# Patient Record
Sex: Female | Born: 1945 | Race: White | Hispanic: No | Smoking: Never smoker
Health system: Southern US, Community
[De-identification: ages and names within clinical notes are randomized; demographics above are authoritative.]

## PROBLEM LIST (undated history)

## (undated) DIAGNOSIS — M1990 Unspecified osteoarthritis, unspecified site: Secondary | ICD-10-CM

## (undated) DIAGNOSIS — J988 Other specified respiratory disorders: Secondary | ICD-10-CM

## (undated) DIAGNOSIS — A310 Pulmonary mycobacterial infection: Secondary | ICD-10-CM

## (undated) DIAGNOSIS — I509 Heart failure, unspecified: Secondary | ICD-10-CM

## (undated) DIAGNOSIS — B965 Pseudomonas (aeruginosa) (mallei) (pseudomallei) as the cause of diseases classified elsewhere: Secondary | ICD-10-CM

## (undated) DIAGNOSIS — A319 Mycobacterial infection, unspecified: Secondary | ICD-10-CM

## (undated) DIAGNOSIS — A318 Other mycobacterial infections: Secondary | ICD-10-CM

## (undated) DIAGNOSIS — I10 Essential (primary) hypertension: Secondary | ICD-10-CM

## (undated) DIAGNOSIS — E079 Disorder of thyroid, unspecified: Secondary | ICD-10-CM

## (undated) HISTORY — PX: ABDOMINAL HYSTERECTOMY: SHX81

## (undated) HISTORY — PX: CARDIAC SURGERY: SHX584

## (undated) HISTORY — PX: TONSILLECTOMY: SUR1361

---
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
## 1963-04-23 HISTORY — PX: CYST REMOVAL TRUNK: SHX6283

## 1970-04-22 HISTORY — PX: VARICOSE VEIN SURGERY: SHX832

## 1973-04-22 HISTORY — PX: DILATION AND CURETTAGE OF UTERUS: SHX78

## 1981-04-22 HISTORY — PX: HAND SURGERY: SHX662

## 1999-04-23 HISTORY — PX: BRONCHOSCOPY: SUR163

## 2006-04-22 HISTORY — PX: COLONOSCOPY: SHX174

## 2011-02-27 DIAGNOSIS — F411 Generalized anxiety disorder: Secondary | ICD-10-CM | POA: Diagnosis present

## 2014-10-12 DIAGNOSIS — F324 Major depressive disorder, single episode, in partial remission: Secondary | ICD-10-CM | POA: Diagnosis present

## 2016-03-24 ENCOUNTER — Encounter (HOSPITAL_COMMUNITY): Payer: Self-pay | Admitting: Emergency Medicine

## 2016-03-24 ENCOUNTER — Emergency Department (HOSPITAL_COMMUNITY)
Admission: EM | Admit: 2016-03-24 | Discharge: 2016-03-24 | Disposition: A | Discharge status: Home / Self Care | Payer: Medicare Other | Attending: Emergency Medicine | Admitting: Emergency Medicine

## 2016-03-24 DIAGNOSIS — Z7982 Long term (current) use of aspirin: Secondary | ICD-10-CM | POA: Diagnosis not present

## 2016-03-24 DIAGNOSIS — R55 Syncope and collapse: Secondary | ICD-10-CM | POA: Insufficient documentation

## 2016-03-24 DIAGNOSIS — I10 Essential (primary) hypertension: Secondary | ICD-10-CM | POA: Diagnosis not present

## 2016-03-24 DIAGNOSIS — Z79899 Other long term (current) drug therapy: Secondary | ICD-10-CM | POA: Diagnosis not present

## 2016-03-24 DIAGNOSIS — R42 Dizziness and giddiness: Secondary | ICD-10-CM | POA: Diagnosis present

## 2016-03-24 HISTORY — DX: Pulmonary mycobacterial infection: A31.0

## 2016-03-24 HISTORY — DX: Other mycobacterial infections: A31.8

## 2016-03-24 HISTORY — DX: Unspecified osteoarthritis, unspecified site: M19.90

## 2016-03-24 HISTORY — DX: Mycobacterial infection, unspecified: A31.9

## 2016-03-24 HISTORY — DX: Disorder of thyroid, unspecified: E07.9

## 2016-03-24 HISTORY — DX: Essential (primary) hypertension: I10

## 2016-03-24 LAB — BASIC METABOLIC PANEL
Anion gap: 8 (ref 5–15)
BUN: 26 mg/dL — ABNORMAL HIGH (ref 6–20)
CO2: 28 mmol/L (ref 22–32)
Calcium: 9 mg/dL (ref 8.9–10.3)
Chloride: 101 mmol/L (ref 101–111)
Creatinine, Ser: 1.25 mg/dL — ABNORMAL HIGH (ref 0.44–1.00)
GFR calc Af Amer: 49 mL/min — ABNORMAL LOW (ref >60)
GFR calc non Af Amer: 42 mL/min — ABNORMAL LOW (ref >60)
Glucose, Bld: 81 mg/dL (ref 65–99)
Potassium: 4.9 mmol/L (ref 3.5–5.1)
Sodium: 137 mmol/L (ref 135–145)

## 2016-03-24 LAB — CBC
HCT: 42.9 % (ref 36.0–46.0)
Hemoglobin: 14.3 g/dL (ref 12.0–15.0)
MCH: 31.2 pg (ref 26.0–34.0)
MCHC: 33.3 g/dL (ref 30.0–36.0)
MCV: 93.7 fL (ref 78.0–100.0)
Platelets: 194 10*3/uL (ref 150–400)
RBC: 4.58 MIL/uL (ref 3.87–5.11)
RDW: 13.4 % (ref 11.5–15.5)
WBC: 6.9 10*3/uL (ref 4.0–10.5)

## 2016-03-24 LAB — CBG MONITORING, ED: Glucose-Capillary: 78 mg/dL (ref 65–99)

## 2016-03-24 NOTE — ED Triage Notes (Signed)
Attempted to draw blood from IV, unable to obtain.Redressed site

## 2016-03-24 NOTE — ED Triage Notes (Signed)
Pt with near syncope at approximately 1000, pt states she was in church and felt dizzy and weak, lost vision, but no LOC. Pt states she then felt nauseated.

## 2016-03-24 NOTE — ED Notes (Signed)
Bed: WF09 Expected date:  Expected time:  Means of arrival:  Comments: 70 yo syncope- NSB@58 

## 2016-03-24 NOTE — ED Provider Notes (Signed)
[facility]-EMERGENCY DEPT Provider Note   CSN: [id number] Arrival date & time: 03/24/16  1102     History   Chief Complaint Chief Complaint  Patient presents with  . Dizziness  . Weakness    HPI Stacy Sanchez is a 70 y.o. female.  70 yo F with a chief complaint of a syncopal event. Patient was sitting in church and had a feeling that the world was closing in on her. Does not feel that she went [place] out. Denied chest pain shortness of breath headache neck pain. Denies any recent injury denies blood in her stool or dark stool. Denies fevers or chills and denies decreased oral intake. Patient is currently back to baseline and feels fine. Has a history of chronic pulmonary disease and feels that it's at its baseline. Having a mild increased cough. Denies increased sputum or change in sputum.   The history is provided by the patient.  Dizziness  Associated symptoms: syncope and weakness   Associated symptoms: no chest pain, no headaches, no nausea, no palpitations, no shortness of breath and no vomiting   Weakness  Primary symptoms include no dizziness. Pertinent negatives include no shortness of breath, no chest pain, no vomiting and no headaches.  Loss of Consciousness   This is a new problem. The current episode started 1 to 2 hours ago. The problem occurs rarely. The problem has been resolved. There was no loss of consciousness. The problem is associated with normal activity. Associated symptoms include weakness. Pertinent negatives include chest pain, congestion, dizziness, fever, headaches, nausea, palpitations and vomiting. She has tried nothing for the symptoms. The treatment provided no relief.    Past Medical History:  Diagnosis Date  . Arthritis   . Hypertension   . Mycobacterium abscessus infection   . Mycobacterium avium complex (HCC)   . Thyroid disease     There are no active problems to display for this patient.   Past Surgical History:  Procedure Laterality Date    . ABDOMINAL HYSTERECTOMY    . CARDIAC SURGERY    . TONSILLECTOMY      OB History    No data available       Home Medications    Prior to Admission medications   Medication Sig Start Date End Date Taking? Authorizing Provider  amLODipine (NORVASC) 2.5 MG tablet Take 2.5 mg by mouth daily.   Yes Historical Provider, MD  aspirin EC 81 MG tablet Take 81 mg by mouth daily.   Yes Historical Provider, MD  buPROPion (WELLBUTRIN) 75 MG tablet Take 75 mg by mouth daily.   Yes Historical Provider, MD  escitalopram (LEXAPRO) 5 MG tablet Take 5 mg by mouth daily.   Yes Historical Provider, MD  gabapentin (NEURONTIN) 300 MG capsule Take 300 mg by mouth 2 (two) times daily.   Yes Historical Provider, MD  HYDROcodone-acetaminophen (NORCO/VICODIN) 5-325 MG tablet Take 0.5 tablets by mouth every 6 (six) hours as needed for moderate pain.   Yes Historical Provider, MD  levothyroxine (SYNTHROID, LEVOTHROID) 50 MCG tablet Take 50 mcg by mouth daily before breakfast. Take 1 tablet by mouth daily except for Saturday's take 1.5 tablets   Yes Historical Provider, MD  Multiple Vitamin (MULTIVITAMIN WITH MINERALS) TABS tablet Take 1 tablet by mouth daily.   Yes Historical Provider, MD  nadolol (CORGARD) 20 MG tablet Take 20 mg by mouth daily.   Yes Historical Provider, MD  PRESCRIPTION MEDICATION 80 mg/mL. Gentamicin 80 mg/mL inhalation solution mixed with NaCl. Use in nebulizer  once daily.   Yes Historical Provider, MD    Family History History reviewed. No pertinent family history.  Social History Social History  Substance Use Topics  . Smoking status: Never Smoker  . Smokeless tobacco: Never Used  . Alcohol use No     Allergies   Amikacin; Ethambutol; Levaquin [levofloxacin]; Parafon forte dsc [chlorzoxazone]; Rifabutin; Tequin [gatifloxacin]; Tramadol; and Zyvox [linezolid]   Review of Systems Review of Systems  Constitutional: Negative for chills and fever.  HENT: Negative for congestion  and rhinorrhea.   Eyes: Negative for redness and visual disturbance.  Respiratory: Positive for cough. Negative for shortness of breath and wheezing.   Cardiovascular: Positive for syncope. Negative for chest pain and palpitations.  Gastrointestinal: Negative for nausea and vomiting.  Genitourinary: Negative for dysuria and urgency.  Musculoskeletal: Negative for arthralgias and myalgias.  Skin: Negative for pallor and wound.  Neurological: Positive for syncope and weakness. Negative for dizziness and headaches.     Physical Exam Updated Vital Signs BP 151/96   Pulse 70   Temp 97.8 F (36.6 C) (Oral)   Resp 21   Ht 5\' 8"  (1.727 m)   Wt 133 lb (60.3 kg)   SpO2 95%   BMI 20.22 kg/m   Physical Exam  Constitutional: She is oriented to person, place, and time. She appears well-developed and well-nourished. No distress.  HENT:  Head: Normocephalic and atraumatic.  Eyes: EOM are normal. Pupils are equal, round, and reactive to light.  Neck: Normal range of motion. Neck supple.  Cardiovascular: Normal rate and regular rhythm.  Exam reveals no gallop and no friction rub.   No murmur heard. Pulmonary/Chest: Effort normal. She has no wheezes. She has rhonchi in the right lower field and the left lower field. She has no rales.  Abdominal: Soft. She exhibits no distension and no mass. There is no tenderness. There is no guarding.  Musculoskeletal: She exhibits no edema or tenderness.  Neurological: She is alert and oriented to person, place, and time. She has normal strength. No cranial nerve deficit or sensory deficit. GCS eye subscore is 4. GCS verbal subscore is 5. GCS motor subscore is 6.  Reflex Scores:      Tricep reflexes are 2+ on the right side and 2+ on the left side.      Bicep reflexes are 2+ on the right side and 2+ on the left side.      Brachioradialis reflexes are 2+ on the right side and 2+ on the left side.      Patellar reflexes are 2+ on the right side and 2+ on the  left side.      Achilles reflexes are 2+ on the right side and 2+ on the left side. Skin: Skin is warm and dry. She is not diaphoretic.  Psychiatric: She has a normal mood and affect. Her behavior is normal.  Nursing note and vitals reviewed.    ED Treatments / Results  Labs (all labs ordered are listed, but only abnormal results are displayed) Labs Reviewed  BASIC METABOLIC PANEL - Abnormal; Notable for the following:       Result Value   BUN 26 (*)    Creatinine, Ser 1.25 (*)    GFR calc non Af Amer 42 (*)    GFR calc Af Amer 49 (*)    All other components within normal limits  CBC  URINALYSIS, ROUTINE W REFLEX MICROSCOPIC (NOT AT [facility])  CBG MONITORING, ED    EKG  EKG  Interpretation  Date/Time:  Sunday March 24 2016 11:02:54 EST Ventricular Rate:  49 PR Interval:    QRS Duration: 88 QT Interval:  468 QTC Calculation: 423 R Axis:   -63 Text Interpretation:  Sinus bradycardia Probable left atrial enlargement LAD, consider left anterior fascicular block Nonspecific T abnormalities, anterior leads no prolonged qt, brugada or wpw No old tracing to compare Confirmed by Chasten Blaze MD, DANIEL ([id number]) on 03/24/2016 12:08:43 PM       Radiology No results found.  Procedures Procedures (including critical care time)  Medications Ordered in ED Medications - No data to display   Initial Impression / Assessment and Plan / ED Course  I have reviewed the triage vital signs and the nursing notes.  Pertinent labs & imaging results that were available during my care of the patient were reviewed by me and considered in my medical decision making (see chart for details).  Clinical Course     70  yo F With a chief complaint of a near-syncopal episode while at church. This occurred while she was sitting and resting. Had a feeling of the world was closing in on her. Now feels great. EKG with no significant findings. Will obtain basic labs secondary to age. Hx of MAC, sees pulmonology at  [facility].  Bad lung sounds, likely poor at baseline, offered breathing treatments and steroids but declined.   Labs unremarkable.  D/c home.   1:48 PM:  I have discussed the diagnosis/risks/treatment options with the patient and family and believe the pt to be eligible for discharge home to follow-up with PCP. We also discussed returning to the ED immediately if new or worsening sx occur. We discussed the sx which are most concerning (e.g., sudden worsening pain, fever, inability to tolerate by mouth) that necessitate immediate return. Medications administered to the patient during their visit and any new prescriptions provided to the patient are listed below.  Medications given during this visit Medications - No data to display   The patient appears reasonably screen and/or stabilized for discharge and I doubt any other medical condition or other [facility] requiring further screening, evaluation, or treatment in the ED at this time prior to discharge.    Final Clinical Impressions(s) / ED Diagnoses   Final diagnoses:  Near syncope    New Prescriptions New Prescriptions   No medications on file     Melene Plan, DO 03/24/16 1348

## 2016-03-24 NOTE — Discharge Instructions (Signed)
Follow up with the family doc, drink plenty of fluids, return for sob, chest pain .

## 2016-03-24 NOTE — ED Notes (Signed)
PT aware of urine sample 

## 2016-05-03 DIAGNOSIS — R413 Other amnesia: Secondary | ICD-10-CM | POA: Diagnosis present

## 2018-11-27 ENCOUNTER — Inpatient Hospital Stay (HOSPITAL_COMMUNITY)
Admission: EM | Admit: 2018-11-27 | Discharge: 2018-12-01 | DRG: 190 | Disposition: A | Discharge status: Home Health | Payer: Medicare Other | Attending: Internal Medicine | Admitting: Internal Medicine

## 2018-11-27 ENCOUNTER — Emergency Department (HOSPITAL_COMMUNITY): Payer: Medicare Other

## 2018-11-27 ENCOUNTER — Other Ambulatory Visit: Payer: Self-pay

## 2018-11-27 DIAGNOSIS — A498 Other bacterial infections of unspecified site: Secondary | ICD-10-CM | POA: Diagnosis present

## 2018-11-27 DIAGNOSIS — Z7982 Long term (current) use of aspirin: Secondary | ICD-10-CM

## 2018-11-27 DIAGNOSIS — G620 Drug-induced polyneuropathy: Secondary | ICD-10-CM

## 2018-11-27 DIAGNOSIS — B379 Candidiasis, unspecified: Secondary | ICD-10-CM | POA: Diagnosis present

## 2018-11-27 DIAGNOSIS — J9601 Acute respiratory failure with hypoxia: Secondary | ICD-10-CM | POA: Diagnosis present

## 2018-11-27 DIAGNOSIS — Z79899 Other long term (current) drug therapy: Secondary | ICD-10-CM

## 2018-11-27 DIAGNOSIS — M1990 Unspecified osteoarthritis, unspecified site: Secondary | ICD-10-CM | POA: Diagnosis present

## 2018-11-27 DIAGNOSIS — J189 Pneumonia, unspecified organism: Secondary | ICD-10-CM | POA: Diagnosis present

## 2018-11-27 DIAGNOSIS — F411 Generalized anxiety disorder: Secondary | ICD-10-CM | POA: Diagnosis present

## 2018-11-27 DIAGNOSIS — Z888 Allergy status to other drugs, medicaments and biological substances status: Secondary | ICD-10-CM

## 2018-11-27 DIAGNOSIS — J470 Bronchiectasis with acute lower respiratory infection: Principal | ICD-10-CM | POA: Diagnosis present

## 2018-11-27 DIAGNOSIS — F329 Major depressive disorder, single episode, unspecified: Secondary | ICD-10-CM | POA: Diagnosis present

## 2018-11-27 DIAGNOSIS — Z7952 Long term (current) use of systemic steroids: Secondary | ICD-10-CM | POA: Diagnosis not present

## 2018-11-27 DIAGNOSIS — N183 Chronic kidney disease, stage 3 unspecified: Secondary | ICD-10-CM | POA: Diagnosis present

## 2018-11-27 DIAGNOSIS — I10 Essential (primary) hypertension: Secondary | ICD-10-CM

## 2018-11-27 DIAGNOSIS — G629 Polyneuropathy, unspecified: Secondary | ICD-10-CM | POA: Diagnosis present

## 2018-11-27 DIAGNOSIS — Z79890 Hormone replacement therapy (postmenopausal): Secondary | ICD-10-CM

## 2018-11-27 DIAGNOSIS — Z792 Long term (current) use of antibiotics: Secondary | ICD-10-CM

## 2018-11-27 DIAGNOSIS — Z79891 Long term (current) use of opiate analgesic: Secondary | ICD-10-CM | POA: Diagnosis not present

## 2018-11-27 DIAGNOSIS — A310 Pulmonary mycobacterial infection: Secondary | ICD-10-CM | POA: Diagnosis present

## 2018-11-27 DIAGNOSIS — E039 Hypothyroidism, unspecified: Secondary | ICD-10-CM | POA: Diagnosis present

## 2018-11-27 DIAGNOSIS — J471 Bronchiectasis with (acute) exacerbation: Secondary | ICD-10-CM | POA: Diagnosis present

## 2018-11-27 DIAGNOSIS — B370 Candidal stomatitis: Secondary | ICD-10-CM | POA: Diagnosis present

## 2018-11-27 DIAGNOSIS — I2510 Atherosclerotic heart disease of native coronary artery without angina pectoris: Secondary | ICD-10-CM | POA: Diagnosis present

## 2018-11-27 DIAGNOSIS — B965 Pseudomonas (aeruginosa) (mallei) (pseudomallei) as the cause of diseases classified elsewhere: Secondary | ICD-10-CM | POA: Diagnosis present

## 2018-11-27 DIAGNOSIS — M419 Scoliosis, unspecified: Secondary | ICD-10-CM | POA: Diagnosis present

## 2018-11-27 DIAGNOSIS — Z20828 Contact with and (suspected) exposure to other viral communicable diseases: Secondary | ICD-10-CM | POA: Diagnosis present

## 2018-11-27 DIAGNOSIS — Y95 Nosocomial condition: Secondary | ICD-10-CM | POA: Diagnosis present

## 2018-11-27 DIAGNOSIS — I129 Hypertensive chronic kidney disease with stage 1 through stage 4 chronic kidney disease, or unspecified chronic kidney disease: Secondary | ICD-10-CM | POA: Diagnosis present

## 2018-11-27 DIAGNOSIS — J479 Bronchiectasis, uncomplicated: Secondary | ICD-10-CM

## 2018-11-27 DIAGNOSIS — R079 Chest pain, unspecified: Secondary | ICD-10-CM | POA: Diagnosis present

## 2018-11-27 LAB — URINALYSIS, ROUTINE W REFLEX MICROSCOPIC
Bilirubin Urine: NEGATIVE
Glucose, UA: NEGATIVE mg/dL
Hgb urine dipstick: NEGATIVE
Ketones, ur: NEGATIVE mg/dL
Leukocytes,Ua: NEGATIVE
Nitrite: NEGATIVE
Protein, ur: NEGATIVE mg/dL
Specific Gravity, Urine: 1.009 (ref 1.005–1.030)
pH: 7 (ref 5.0–8.0)

## 2018-11-27 LAB — CBC WITH DIFFERENTIAL/PLATELET
Abs Immature Granulocytes: 0.15 10*3/uL — ABNORMAL HIGH (ref 0.00–0.07)
Basophils Absolute: 0 10*3/uL (ref 0.0–0.1)
Basophils Relative: 0 %
Eosinophils Absolute: 0 10*3/uL (ref 0.0–0.5)
Eosinophils Relative: 0 %
HCT: 40.4 % (ref 36.0–46.0)
Hemoglobin: 12.9 g/dL (ref 12.0–15.0)
Immature Granulocytes: 1 %
Lymphocytes Relative: 4 %
Lymphs Abs: 0.8 10*3/uL (ref 0.7–4.0)
MCH: 30.7 pg (ref 26.0–34.0)
MCHC: 31.9 g/dL (ref 30.0–36.0)
MCV: 96.2 fL (ref 80.0–100.0)
Monocytes Absolute: 1 10*3/uL (ref 0.1–1.0)
Monocytes Relative: 5 %
Neutro Abs: 18.2 10*3/uL — ABNORMAL HIGH (ref 1.7–7.7)
Neutrophils Relative %: 90 %
Platelets: 255 10*3/uL (ref 150–400)
RBC: 4.2 MIL/uL (ref 3.87–5.11)
RDW: 14.6 % (ref 11.5–15.5)
WBC: 20.2 10*3/uL — ABNORMAL HIGH (ref 4.0–10.5)
nRBC: 0 % (ref 0.0–0.2)

## 2018-11-27 LAB — SARS CORONAVIRUS 2 BY RT PCR (HOSPITAL ORDER, PERFORMED IN ~~LOC~~ HOSPITAL LAB): SARS Coronavirus 2: NEGATIVE

## 2018-11-27 LAB — COMPREHENSIVE METABOLIC PANEL
ALT: 27 U/L (ref 0–44)
AST: 19 U/L (ref 15–41)
Albumin: 2.9 g/dL — ABNORMAL LOW (ref 3.5–5.0)
Alkaline Phosphatase: 47 U/L (ref 38–126)
Anion gap: 9 (ref 5–15)
BUN: 24 mg/dL — ABNORMAL HIGH (ref 8–23)
CO2: 31 mmol/L (ref 22–32)
Calcium: 8.9 mg/dL (ref 8.9–10.3)
Chloride: 98 mmol/L (ref 98–111)
Creatinine, Ser: 0.98 mg/dL (ref 0.44–1.00)
GFR calc Af Amer: >60 mL/min (ref >60)
GFR calc non Af Amer: 56 mL/min — ABNORMAL LOW (ref >60)
Glucose, Bld: 101 mg/dL — ABNORMAL HIGH (ref 70–99)
Potassium: 4.1 mmol/L (ref 3.5–5.1)
Sodium: 138 mmol/L (ref 135–145)
Total Bilirubin: 0.8 mg/dL (ref 0.3–1.2)
Total Protein: 6.2 g/dL — ABNORMAL LOW (ref 6.5–8.1)

## 2018-11-27 IMAGING — CT CT ANGIOGRAPHY CHEST
1 of 6 series · 4 of 16 positions shown · IV contrast (omnipaque)
Comparison: None.

CLINICAL DATA: Shortness of breath. History of BEACOCK lung infection
with recent back surgery 2 weeks ago. Right chest pain and shortness
of breath. Concern for pulmonary embolism.

EXAM:
CT ANGIOGRAPHY CHEST WITH CONTRAST
TECHNIQUE: Multidetector CT imaging of the chest was performed using the
standard protocol during bolus administration of intravenous
contrast. Multiplanar CT image reconstructions and MIPs were
obtained to evaluate the vascular anatomy.
CONTRAST:  100mL OMNIPAQUE IOHEXOL 350 MG/ML SOLN

[Series 7: pe thins · axial · 0.74mm/px · z∈[+18,+207]mm · 4 of 452 slices shown]
[im 91/452  lung]
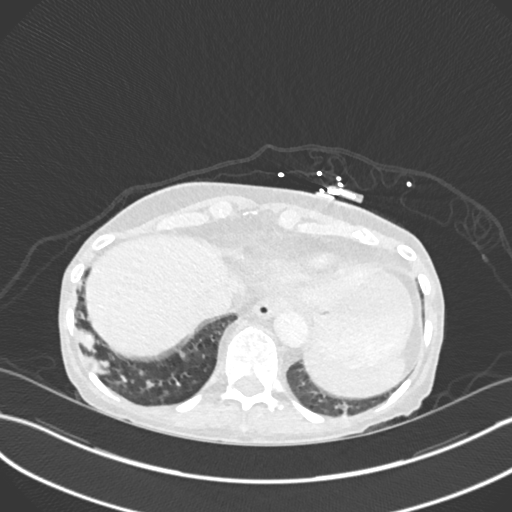
[im 181/452  soft-tissue]
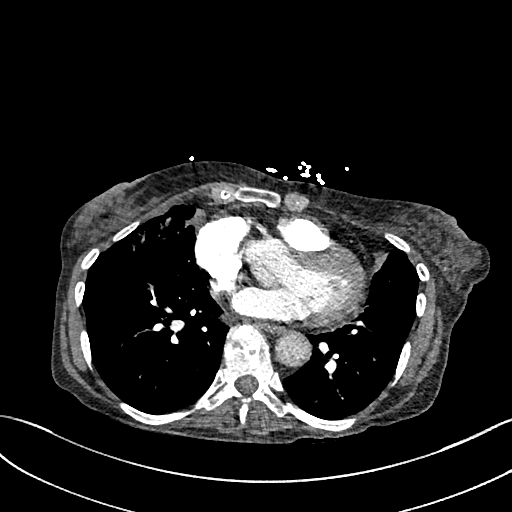
[im 271/452  lung]
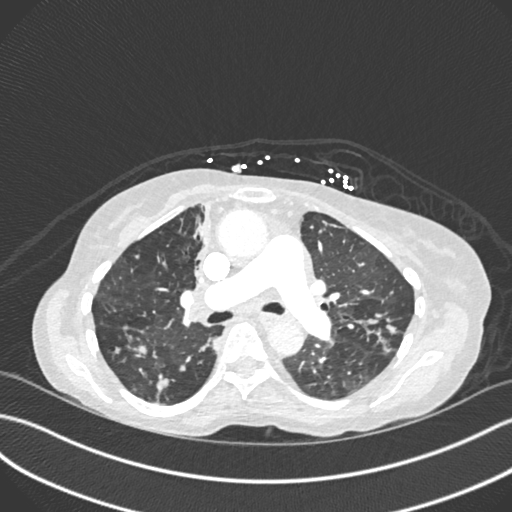
[im 361/452  soft-tissue]
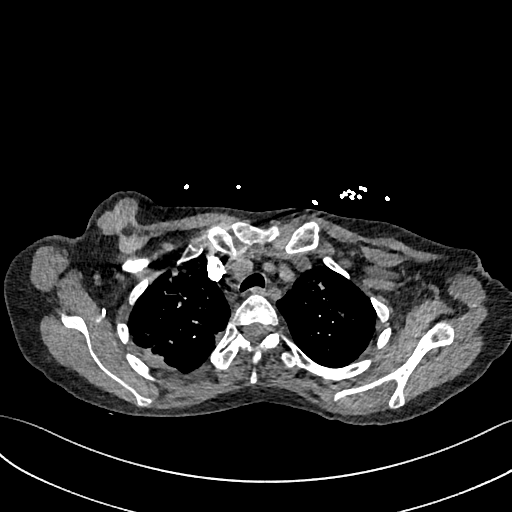

[4 of 16 positions shown; findings below may reference images not displayed]

FINDINGS: Cardiovascular: The pulmonary arteries are well opacified with
contrast to the level of the subsegmental branches. There is no
evidence of acute pulmonary embolism. Minimal atherosclerosis of the
aorta and great vessels. No acute vascular findings are seen. The
heart size is normal. There is no pericardial effusion.

Mediastinum/Nodes: There is a prominent right hilar node measuring
13 mm on image 63/5. There is a subcarinal node measuring 15 mm on
image 77/5. No other enlarged mediastinal, hilar or axillary lymph
nodes are demonstrated. The thyroid gland, trachea and esophagus
demonstrate no significant findings.

Lungs/Pleura: There is no pleural effusion or pneumothorax. There is
extensive chronic lung disease with mosaic attenuation,
bronchiectasis, central airway thickening and mucous plugging. The
bronchiectasis is greatest in the right middle lobe, lingula and
right lower lobe. There are scattered peribronchial inflammatory
changes, greatest in the right upper lobe. No consolidation or
suspicious pulmonary nodule identified. There is mild underlying
emphysema.

Upper abdomen: No significant findings are seen within the
visualized upper abdomen. There is a 10 mm splenic artery aneurysm.

Musculoskeletal/Chest wall: There is no chest wall mass or
suspicious osseous finding.

Review of the MIP images confirms the above findings.
IMPRESSION: 1. No evidence of acute pulmonary embolism or other acute vascular
process.
2. Extensive chronic lung disease compatible with the given history
of atypical mycobacterial infection. Without comparison studies, it
is difficult to exclude acute superimposed inflammation, especially
in the right upper lobe. At least short term chest radiographic
follow up recommended. CT follow-up in 3-6 months should be
considered.
3. Mildly prominent right hilar and subcarinal lymph nodes, likely
reactive.

## 2018-11-27 MED ORDER — VANCOMYCIN HCL IN DEXTROSE 1-5 GM/200ML-% IV SOLN
1000.0000 mg | INTRAVENOUS | Status: DC
  Filled 2018-11-27: qty 200

## 2018-11-27 MED ORDER — SENNOSIDES-DOCUSATE SODIUM 8.6-50 MG PO TABS
1.0000 | ORAL_TABLET | Freq: Every evening | ORAL | Status: DC | PRN
  Administered 2018-11-28 (×2): 1 via ORAL
  Filled 2018-11-27 (×2): qty 1

## 2018-11-27 MED ORDER — BISACODYL 10 MG RE SUPP: 10.0000 mg | Freq: Every day | RECTAL | Status: DC | PRN

## 2018-11-27 MED ORDER — MAGNESIUM CITRATE PO SOLN
1.0000 | Freq: Once | ORAL | Status: AC | PRN
  Administered 2018-11-29: 1 via ORAL
  Filled 2018-11-27: qty 296

## 2018-11-27 MED ORDER — ADULT MULTIVITAMIN W/MINERALS CH
1.0000 | ORAL_TABLET | Freq: Every day | ORAL | Status: DC
  Administered 2018-11-28: 1 via ORAL
  Filled 2018-11-27 (×2): qty 1

## 2018-11-27 MED ORDER — ONDANSETRON HCL 4 MG/2ML IJ SOLN: 4.0000 mg | Freq: Four times a day (QID) | INTRAMUSCULAR | Status: DC | PRN

## 2018-11-27 MED ORDER — ESCITALOPRAM OXALATE 10 MG PO TABS
5.0000 mg | ORAL_TABLET | Freq: Every day | ORAL | Status: DC
  Administered 2018-11-28 – 2018-12-01 (×4): 5 mg via ORAL
  Filled 2018-11-27 (×4): qty 1

## 2018-11-27 MED ORDER — FLUCONAZOLE IN SODIUM CHLORIDE 200-0.9 MG/100ML-% IV SOLN
200.0000 mg | INTRAVENOUS | Status: DC
  Administered 2018-11-27: 200 mg via INTRAVENOUS
  Filled 2018-11-27: qty 100

## 2018-11-27 MED ORDER — PREDNISONE 20 MG PO TABS: 10.0000 mg | ORAL_TABLET | ORAL | Status: DC

## 2018-11-27 MED ORDER — IOHEXOL 350 MG/ML SOLN
100.0000 mL | Freq: Once | INTRAVENOUS | Status: AC | PRN
  Administered 2018-11-27: 15:00:00 100 mL via INTRAVENOUS

## 2018-11-27 MED ORDER — POLYETHYLENE GLYCOL 3350 17 G PO PACK
17.0000 g | PACK | Freq: Every day | ORAL | Status: DC
  Filled 2018-11-27 (×3): qty 1

## 2018-11-27 MED ORDER — GABAPENTIN 300 MG PO CAPS
300.0000 mg | ORAL_CAPSULE | Freq: Two times a day (BID) | ORAL | Status: DC
  Administered 2018-11-28 – 2018-12-01 (×8): 300 mg via ORAL
  Filled 2018-11-27 (×8): qty 1

## 2018-11-27 MED ORDER — PREDNISONE 20 MG PO TABS: 20.0000 mg | ORAL_TABLET | Freq: Every day | ORAL | Status: DC

## 2018-11-27 MED ORDER — SODIUM CHLORIDE 0.9 % IV SOLN
2.0000 g | Freq: Two times a day (BID) | INTRAVENOUS | Status: DC
  Administered 2018-11-28 – 2018-12-01 (×8): 2 g via INTRAVENOUS
  Filled 2018-11-27 (×8): qty 2

## 2018-11-27 MED ORDER — LEVOTHYROXINE SODIUM 50 MCG PO TABS
50.0000 ug | ORAL_TABLET | Freq: Every day | ORAL | Status: DC
  Administered 2018-11-28 – 2018-12-01 (×4): 50 ug via ORAL
  Filled 2018-11-27 (×4): qty 1

## 2018-11-27 MED ORDER — PREDNISONE 5 MG PO TABS
5.0000 mg | ORAL_TABLET | Freq: Every day | ORAL | Status: AC
  Administered 2018-11-30: 5 mg via ORAL
  Filled 2018-11-27: qty 1

## 2018-11-27 MED ORDER — LIDOCAINE 5 % EX PTCH
1.0000 | MEDICATED_PATCH | CUTANEOUS | Status: DC
  Administered 2018-11-27 – 2018-12-01 (×5): 1 via TRANSDERMAL
  Filled 2018-11-27 (×5): qty 1

## 2018-11-27 MED ORDER — ROSUVASTATIN CALCIUM 5 MG PO TABS
10.0000 mg | ORAL_TABLET | Freq: Every day | ORAL | Status: DC
  Administered 2018-11-28 – 2018-12-01 (×4): 10 mg via ORAL
  Filled 2018-11-27 (×4): qty 2

## 2018-11-27 MED ORDER — PREDNISONE 10 MG PO TABS
10.0000 mg | ORAL_TABLET | Freq: Every day | ORAL | Status: AC
  Administered 2018-11-29: 10 mg via ORAL
  Filled 2018-11-27: qty 1

## 2018-11-27 MED ORDER — IPRATROPIUM-ALBUTEROL 0.5-2.5 (3) MG/3ML IN SOLN
3.0000 mL | Freq: Four times a day (QID) | RESPIRATORY_TRACT | Status: DC
  Administered 2018-11-27 – 2018-11-29 (×6): 3 mL via RESPIRATORY_TRACT
  Filled 2018-11-27 (×6): qty 3

## 2018-11-27 MED ORDER — ORPHENADRINE CITRATE ER 100 MG PO TB12
100.0000 mg | ORAL_TABLET | Freq: Two times a day (BID) | ORAL | Status: DC | PRN
  Filled 2018-11-27: qty 1

## 2018-11-27 MED ORDER — SENNA 8.6 MG PO TABS
1.0000 | ORAL_TABLET | Freq: Every day | ORAL | Status: DC | PRN
  Administered 2018-11-28 – 2018-11-29 (×2): 8.6 mg via ORAL
  Filled 2018-11-27 (×2): qty 1

## 2018-11-27 MED ORDER — ACETAMINOPHEN 325 MG PO TABS
650.0000 mg | ORAL_TABLET | Freq: Three times a day (TID) | ORAL | Status: DC | PRN
  Administered 2018-11-30 – 2018-12-01 (×2): 650 mg via ORAL
  Filled 2018-11-27 (×2): qty 2

## 2018-11-27 MED ORDER — PREDNISONE 20 MG PO TABS: 10.0000 mg | ORAL_TABLET | Freq: Every day | ORAL | Status: DC

## 2018-11-27 MED ORDER — NADOLOL 20 MG PO TABS
10.0000 mg | ORAL_TABLET | Freq: Two times a day (BID) | ORAL | Status: DC
  Administered 2018-11-28 – 2018-12-01 (×8): 10 mg via ORAL
  Filled 2018-11-27 (×9): qty 1

## 2018-11-27 MED ORDER — VANCOMYCIN HCL 10 G IV SOLR
1500.0000 mg | Freq: Once | INTRAVENOUS | Status: AC
  Administered 2018-11-27: 1500 mg via INTRAVENOUS
  Filled 2018-11-27: qty 1500

## 2018-11-27 MED ORDER — SODIUM CHLORIDE 0.9 % IV SOLN
INTRAVENOUS | Status: DC
  Administered 2018-11-27 – 2018-11-29 (×3): via INTRAVENOUS

## 2018-11-27 MED ORDER — AMLODIPINE BESYLATE 5 MG PO TABS
5.0000 mg | ORAL_TABLET | Freq: Every day | ORAL | Status: DC
  Administered 2018-11-28 – 2018-12-01 (×4): 5 mg via ORAL
  Filled 2018-11-27 (×4): qty 1

## 2018-11-27 MED ORDER — GUAIFENESIN ER 600 MG PO TB12
1200.0000 mg | ORAL_TABLET | Freq: Two times a day (BID) | ORAL | Status: DC
  Administered 2018-11-28 – 2018-12-01 (×8): 1200 mg via ORAL
  Filled 2018-11-27 (×8): qty 2

## 2018-11-27 MED ORDER — ONDANSETRON HCL 4 MG PO TABS: 4.0000 mg | ORAL_TABLET | Freq: Four times a day (QID) | ORAL | Status: DC | PRN

## 2018-11-27 MED ORDER — PREDNISONE 20 MG PO TABS
20.0000 mg | ORAL_TABLET | Freq: Every day | ORAL | Status: AC
  Administered 2018-11-28: 20 mg via ORAL
  Filled 2018-11-27: qty 1

## 2018-11-27 MED ORDER — ASPIRIN EC 81 MG PO TBEC
81.0000 mg | DELAYED_RELEASE_TABLET | Freq: Every day | ORAL | Status: DC
  Administered 2018-11-28 – 2018-12-01 (×4): 81 mg via ORAL
  Filled 2018-11-27 (×4): qty 1

## 2018-11-27 MED ORDER — HYDROCODONE-ACETAMINOPHEN 5-325 MG PO TABS
0.5000 | ORAL_TABLET | Freq: Four times a day (QID) | ORAL | Status: DC | PRN
  Administered 2018-11-28 – 2018-11-29 (×2): 0.5 via ORAL
  Filled 2018-11-27 (×2): qty 1

## 2018-11-27 NOTE — Progress Notes (Addendum)
Pharmacy Antibiotic Note  Stacy Sanchez is a 73 y.o. female admitted on 11/27/2018 with pneumonia. Pt has chronic MAC infection, on prednisone taper, currently 30 mg daily. Recently admitted to [facility] where sputum cx (11/18/18) grew Pseudomonas and yeast, not discharged on abx. Pharmacy spoke to [facility] and confirmed that Pseudomonas from sputum cx was sensitive to cefepime. MD has ordered fluconazole for thrush. CTA chest today showed right upper lobe infiltrate. Pharmacy has been consulted for vancomycin and cefepime dosing.  Afebrile, Tmax 99.2. WBC elevated 20.2 in the setting of steroid use. Scr 0.98, baseline stage III CKD.  Plan: Vancomycin 1500 mg IV loading dose Vancomycin 1 gm IV Q 24 hrs. Goal AUC 400-550. Expected AUC: 448.5 SCr used: 0.98 Cefepime 2 gm IV q12h F/u Cxs, renal function, clinical improvement, and vanc levels at steady state  Height: _0  (170.2 cm) Weight: 153 lb 10.6 oz (69.7 kg) IBW/kg (Calculated) : 61.6  Temp (24hrs), Avg:99.2 F (37.3 C), Min:99.2 F (37.3 C), Max:99.2 F (37.3 C)  Recent Labs  Lab 11/27/18 1253  WBC 20.2*  CREATININE 0.98    Estimated Creatinine Clearance: 48.2 mL/min (by C-G formula based on SCr of 0.98 mg/dL).    Allergies  Allergen Reactions  . Amikacin Other (See Comments)    nephropathy   . Ethambutol Other (See Comments)    neuropathy  . Levaquin [Levofloxacin] Other (See Comments)    Fainting and weakness  . Parafon Forte Dsc [Chlorzoxazone] Hives  . Rifabutin Diarrhea  . Tequin [Gatifloxacin] Other (See Comments)    Fainting and weakness  . Zyvox [Linezolid] Diarrhea    Antimicrobials this admission: 8/7 vanc >> 8/7 cefepime >> 8/7 fluconazole >>  Microbiology results: 7/29 Sputum cx [facility]): mod growth Pseudomonas sensitive to cefepime and Zosyn, and yeast  Thank you for allowing pharmacy to be a part of this patient's care.  Berenice Bouton, PharmD PGY1  Pharmacy Resident Office phone: [phone]  11/27/2018 7:43 PM

## 2018-11-27 NOTE — ED Triage Notes (Addendum)
Pt has hx of MAC Lung infection x  18 years ; pt recently had back surgery x 2 weeks ; pt states that this morning she began to have right rib pain along with sob ; denies any chest pain or dizziness ; pt states she has had muscle spasms before and it feels the same but states the pain is the worst it has ever been before ; pt took tramadol and a muscle relaxer prior to arrival and does reports a decrease in pain ; upon fire dept arival patients o2 saturations where in the 80's ' pt does not wear 02 ; normals sats for patient are low 90's

## 2018-11-27 NOTE — ED Notes (Addendum)
ED TO INPATIENT HANDOFF REPORT  ED Nurse Name and Phone #: [phone], [place] RN  S Name/Age/Gender Stacy Sanchez 73 y.o. female Room/Bed: 024C/024C  Code Status   Code Status: Full Code  Home/SNF/Other Home Patient oriented to: self, place, time and situation Is this baseline? Yes   Triage Complete: Triage complete  Chief Complaint SOB POST SURGERY  Triage Note Pt has hx of MAC Lung infection x  18 years ; pt recently had back surgery x 2 weeks ; pt states that this morning she began to have right rib pain along with sob ; denies any chest pain or dizziness ; pt states she has had muscle spasms before and it feels the same but states the pain is the worst it has ever been before ; pt took tramadol and a muscle relaxer prior to arrival and does reports a decrease in pain ; upon fire dept arival patients o2 saturations where in the 80's ' pt does not wear 02 ; normals sats for patient are low 90's    Allergies Allergies  Allergen Reactions  . Amikacin Other (See Comments)    nephropathy   . Ethambutol Other (See Comments)    neuropathy  . Levaquin [Levofloxacin] Other (See Comments)    Fainting and weakness  . Parafon Forte Dsc [Chlorzoxazone] Hives  . Rifabutin Diarrhea  . Tequin [Gatifloxacin] Other (See Comments)    Fainting and weakness  . Zyvox [Linezolid] Diarrhea    Level of Care/Admitting Diagnosis ED Disposition    ED Disposition Condition Comment   Admit  Hospital Area: [place] [[id number]]  Level of Care: Progressive [102]  Covid Evaluation: Confirmed COVID Negative  Diagnosis: Acute respiratory failure with hypoxia [facility]) [[id number]]  Admitting Physician: Monna Fam  Attending Physician: Benny Lennert, AVA [place].Pepper  Estimated length of stay: 5 - 7 days  Certification:: I certify this patient will need inpatient services for at least 2 midnights  PT Class (Do Not Modify): Inpatient [101]  PT Acc Code (Do Not Modify): Private [1]        B Medical/Surgery History Past Medical History:  Diagnosis Date  . Arthritis   . Hypertension   . Mycobacterium abscessus infection   . Mycobacterium avium complex ([place])   . Thyroid disease    Past Surgical History:  Procedure Laterality Date  . ABDOMINAL HYSTERECTOMY    . CARDIAC SURGERY    . TONSILLECTOMY       A IV Location/Drains/Wounds Patient Lines/Drains/Airways Status   Active Line/Drains/Airways    Name:   Placement date:   Placement time:   Site:   Days:   Peripheral IV 11/27/18 Left Antecubital   11/27/18    1309    Antecubital   less than 1          Intake/Output Last 24 hours No intake or output data in the 24 hours ending 11/27/18 1948  Labs/Imaging Results for orders placed or performed during the hospital encounter of 11/27/18 (from the past 48 hour(s))  CBC with Differential     Status: Abnormal   Collection Time: 11/27/18 12:53 PM  Result Value Ref Range   WBC 20.2 (H) 4.0 - 10.5 K/uL   RBC 4.20 3.87 - 5.11 MIL/uL   Hemoglobin 12.9 12.0 - 15.0 g/dL   HCT 40.4 36.0 - 46.0 %   MCV 96.2 80.0 - 100.0 fL   MCH 30.7 26.0 - 34.0 pg   MCHC 31.9 30.0 - 36.0 g/dL   RDW 14.6 11.5 -  15.5 %   Platelets 255 150 - 400 K/uL   nRBC 0.0 0.0 - 0.2 %   Neutrophils Relative % 90 %   Neutro Abs 18.2 (H) 1.7 - 7.7 K/uL   Lymphocytes Relative 4 %   Lymphs Abs 0.8 0.7 - 4.0 K/uL   Monocytes Relative 5 %   Monocytes Absolute 1.0 0.1 - 1.0 K/uL   Eosinophils Relative 0 %   Eosinophils Absolute 0.0 0.0 - 0.5 K/uL   Basophils Relative 0 %   Basophils Absolute 0.0 0.0 - 0.1 K/uL   Immature Granulocytes 1 %   Abs Immature Granulocytes 0.15 (H) 0.00 - 0.07 K/uL    Comment: Performed at [facility] [address], [place], [place] [address]  Comprehensive metabolic panel     Status: Abnormal   Collection Time: 11/27/18 12:53 PM  Result Value Ref Range   Sodium 138 135 - 145 mmol/L   Potassium 4.1 3.5 - 5.1 mmol/L   Chloride 98 98 - 111 mmol/L   CO2 31  22 - 32 mmol/L   Glucose, Bld 101 (H) 70 - 99 mg/dL   BUN 24 (H) 8 - 23 mg/dL   Creatinine, Ser 0.98 0.44 - 1.00 mg/dL   Calcium 8.9 8.9 - 10.3 mg/dL   Total Protein 6.2 (L) 6.5 - 8.1 g/dL   Albumin 2.9 (L) 3.5 - 5.0 g/dL   AST 19 15 - 41 U/L   ALT 27 0 - 44 U/L   Alkaline Phosphatase 47 38 - 126 U/L   Total Bilirubin 0.8 0.3 - 1.2 mg/dL   GFR calc non Af Amer 56 (L) >60 mL/min   GFR calc Af Amer >60 >60 mL/min   Anion gap 9 5 - 15    Comment: Performed at [facility] [address] [place], [place] [address]  Urinalysis, Routine w reflex microscopic     Status: None   Collection Time: 11/27/18  2:50 PM  Result Value Ref Range   Color, Urine YELLOW YELLOW   APPearance CLEAR CLEAR   Specific Gravity, Urine 1.009 1.005 - 1.030   pH 7.0 5.0 - 8.0   Glucose, UA NEGATIVE NEGATIVE mg/dL   Hgb urine dipstick NEGATIVE NEGATIVE   Bilirubin Urine NEGATIVE NEGATIVE   Ketones, ur NEGATIVE NEGATIVE mg/dL   Protein, ur NEGATIVE NEGATIVE mg/dL   Nitrite NEGATIVE NEGATIVE   Leukocytes,Ua NEGATIVE NEGATIVE    Comment: Performed at [facility] [address] [place], [place] [address]   Ct Angio Chest Pe W And/or Wo Contrast  Result Date: 11/27/2018 CLINICAL DATA:  Shortness of breath. History of MAC lung infection with recent back surgery 2 weeks ago. Right chest pain and shortness of breath. Concern for pulmonary embolism. EXAM: CT ANGIOGRAPHY CHEST WITH CONTRAST TECHNIQUE: Multidetector CT imaging of the chest was performed using the standard protocol during bolus administration of intravenous contrast. Multiplanar CT image reconstructions and MIPs were obtained to evaluate the vascular anatomy. CONTRAST:  144m OMNIPAQUE IOHEXOL 350 MG/ML SOLN COMPARISON:  None. FINDINGS: Cardiovascular: The pulmonary arteries are well opacified with contrast to the level of the subsegmental branches. There is no evidence of acute pulmonary embolism. Minimal atherosclerosis of the aorta and great  vessels. No acute vascular findings are seen. The heart size is normal. There is no pericardial effusion. Mediastinum/Nodes: There is a prominent right hilar node measuring 13 mm on image 63/5. There is a subcarinal node measuring 15 mm on image 77/5. No other  enlarged mediastinal, hilar or axillary lymph nodes are demonstrated. The thyroid gland, trachea and esophagus demonstrate no significant findings. Lungs/Pleura: There is no pleural effusion or pneumothorax. There is extensive chronic lung disease with mosaic attenuation, bronchiectasis, central airway thickening and mucous plugging. The bronchiectasis is greatest in the right middle lobe, lingula and right lower lobe. There are scattered peribronchial inflammatory changes, greatest in the right upper lobe. No consolidation or suspicious pulmonary nodule identified. There is mild underlying emphysema. Upper abdomen: No significant findings are seen within the visualized upper abdomen. 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 Musculoskeletal/Chest wall: There is no chest wall mass or suspicious osseous finding. Review of the MIP images confirms the above findings. IMPRESSION: 1. No evidence of acute pulmonary embolism or other acute vascular process. 2. 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 Without comparison studies, it is difficult to exclude acute superimposed inflammation, especially in the right upper lobe. At least short term chest radiographic follow up recommended. CT follow-up in 3-6 months should be considered. 3. Mildly prominent right hilar and subcarinal lymph nodes, likely reactive. Electronically Signed   By: Richardean Sale M.D.   On: 11/27/2018 16:00    Pending Labs Unresulted Labs (From admission, onward)    Start     Ordered   11/28/18 0500  Blood gas, arterial  Tomorrow morning,   R    Question:  Room air or oxygen  Answer:  Oxygen   11/27/18 1836   11/28/18 0626   Basic metabolic panel  Tomorrow morning,   R     11/27/18 1836   11/28/18 0500  CBC  Tomorrow morning,   R     11/27/18 1836   11/27/18 1612  SARS Coronavirus 2 [facility] order, Performed in [facility] hospital lab) Nasopharyngeal Nasopharyngeal Swab  (Symptomatic/High Risk of Exposure/Tier 1 Patients Labs with Precautions)  Once,   STAT    Question Answer Comment  Is this test for diagnosis or screening Screening   Symptomatic for COVID-19 as defined by CDC Yes   Date of Symptom Onset 11/27/2018   Hospitalized for COVID-19 No   Admitted to ICU for COVID-19 No   Previously tested for COVID-19 No   Resident in a congregate (group) care setting No   Employed in healthcare setting No   Pregnant No      11/27/18 1612          Vitals/Pain Today's Vitals   11/27/18 1345 11/27/18 1500 11/27/18 1830 11/27/18 1900  BP: 138/75 127/67 124/74   Pulse: (!) 53 72 (!) 54   Resp: (!) _0 Temp:      TempSrc:      SpO2: 97% 97% 98%   Weight:    69.7 kg  Height:    _1  (1.702 m)  PainSc:        Isolation Precautions No active isolations  Medications Medications  lidocaine (LIDODERM) 5 % 1 patch (1 patch Transdermal Patch Applied 11/27/18 1311)  ondansetron (ZOFRAN) tablet 4 mg (has no administration in time range)    Or  ondansetron (ZOFRAN) injection 4 mg (has no administration in time range)  0.9 %  sodium chloride infusion (has no administration in time range)  senna-docusate (Senokot-S) tablet 1 tablet (has no administration in time range)  bisacodyl (DULCOLAX) suppository 10 mg (has no administration in time range)  magnesium citrate solution 1 Bottle (has no administration in time range)  acetaminophen (TYLENOL)  tablet 650 mg (has no administration in time range)  aspirin EC tablet 81 mg (has no administration in time range)  HYDROcodone-acetaminophen (NORCO/VICODIN) 5-325 MG per tablet 0.5 tablet (has no administration in time range)  amLODipine (NORVASC) tablet 5 mg  (has no administration in time range)  nadolol (CORGARD) tablet 10 mg (has no administration in time range)  rosuvastatin (CRESTOR) tablet 10 mg (has no administration in time range)  escitalopram (LEXAPRO) tablet 5 mg (has no administration in time range)  levothyroxine (SYNTHROID) tablet 50 mcg (has no administration in time range)  polyethylene glycol (MIRALAX / GLYCOLAX) packet 17 g (has no administration in time range)  senna (SENOKOT) tablet 8.6 mg (has no administration in time range)  gabapentin (NEURONTIN) capsule 300 mg (has no administration in time range)  orphenadrine (NORFLEX) 12 hr tablet 100 mg (has no administration in time range)  multivitamin with minerals tablet 1 tablet (has no administration in time range)  ipratropium-albuterol (DUONEB) 0.5-2.5 (3) MG/3ML nebulizer solution 3 mL (has no administration in time range)  guaiFENesin (MUCINEX) 12 hr tablet 1,200 mg (has no administration in time range)  fluconazole (DIFLUCAN) IVPB 200 mg (has no administration in time range)  predniSONE (DELTASONE) tablet 20 mg (has no administration in time range)  predniSONE (DELTASONE) tablet 10 mg (has no administration in time range)  predniSONE (DELTASONE) tablet 5 mg (has no administration in time range)  iohexol (OMNIPAQUE) 350 MG/ML injection 100 mL (100 mLs Intravenous Contrast Given 11/27/18 1513)    Mobility walks Moderate fall risk   Focused Assessments Pulmonary Assessment Handoff:  Lung sounds: Bilateral Breath Sounds: Diminished L Breath Sounds: Diminished R Breath Sounds: Diminished O2 Device: Room Air        R Recommendations: See Admitting Provider Note  Report given to: Bernadene Bell, RN  Additional Notes:

## 2018-11-27 NOTE — Plan of Care (Signed)
  Problem: Education: Goal: Knowledge of General Education information will improve Description: Including pain rating scale, medication(s)/side effects and non-pharmacologic comfort measures Outcome: Progressing   Problem: Health Behavior/Discharge Planning: Goal: Ability to manage health-related needs will improve Outcome: Progressing   Problem: Clinical Measurements: Goal: Ability to maintain clinical measurements within normal limits will improve Outcome: Progressing   Problem: Clinical Measurements: Goal: Diagnostic test results will improve Outcome: Progressing   Problem: Clinical Measurements: Goal: Respiratory complications will improve Outcome: Progressing   Problem: Clinical Measurements: Goal: Cardiovascular complication will be avoided Outcome: Progressing   

## 2018-11-27 NOTE — ED Notes (Signed)
Attempted to call 5W.

## 2018-11-27 NOTE — ED Notes (Signed)
Dinner tray ordered.

## 2018-11-27 NOTE — ED Notes (Signed)
Attempted to call report to 5W 

## 2018-11-27 NOTE — ED Provider Notes (Signed)
[facility] EMERGENCY DEPARTMENT Provider Note   CSN: [id number] Arrival date & time: 11/27/18  1213    History   Chief Complaint Chief Complaint  Patient presents with   Shortness of Breath    HPI Stacy Sanchez is a 73 y.o. female presenting for evaluation of right-sided rib pain and shortness of breath.  Patient states she had back surgery 2 weeks ago.  Her surgery was complicated by respiratory distress, likely secondary to poor pain control and thus exacerbation of her chronic lung disease.  She has been doing well at home, been oxygen free at home.  Today she developed severe right-sided chest pain.  Patient states this feels like previous muscle spasms.  She took tramadol and her muscle relaxer with mild improvement of pain.  Sats were found to be in the 80s, patient placed on oxygen, which she does not wear chronically. (being investigated if she needs O2 for night).  She denies fevers, chills, sore throat, anterior chest pain, nausea, vomiting, abdominal pain, urinary symptoms, normal bowel movements. She denies leg pain or swelling. She is on ASA daily, takes no other anticoagulants.       HPI  Past Medical History:  Diagnosis Date   Arthritis    Hypertension    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[place])    Thyroid disease     There are no active problems to display for this patient.   Past Surgical History:  Procedure Laterality Date   ABDOMINAL HYSTERECTOMY     CARDIAC SURGERY     TONSILLECTOMY       OB History   No obstetric history on file.      Home Medications    Prior to Admission medications   Medication Sig Start Date End Date Taking? Authorizing Provider  acetaminophen (TYLENOL) 650 MG CR tablet Take 650 mg by mouth every 8 (eight) hours as needed for pain.   Yes [provider]  amLODipine (NORVASC) 5 MG tablet Take 5 mg by mouth daily.    Yes [provider]  aspirin EC  81 MG tablet Take 81 mg by mouth daily.   Yes [provider]  escitalopram (LEXAPRO) 5 MG tablet Take 5 mg by mouth daily.   Yes [provider]  gabapentin (NEURONTIN) 300 MG capsule Take 300 mg by mouth 2 (two) times daily.   Yes [provider]  HYDROcodone-acetaminophen (NORCO/VICODIN) 5-325 MG tablet Take 0.5 tablets by mouth every 6 (six) hours as needed for moderate pain.   Yes [provider]  levothyroxine (SYNTHROID, LEVOTHROID) 50 MCG tablet Take 50 mcg by mouth daily before breakfast. Take 1 tablet by mouth daily except for Saturday and Sunday taking 2 tablets (166mg)   Yes [provider]  Multiple Vitamin (MULTIVITAMIN WITH MINERALS) TABS tablet Take 1 tablet by mouth daily.   Yes [provider]  nadolol (CORGARD) 20 MG tablet Take 10 mg by mouth 2 (two) times daily.    Yes [provider]  orphenadrine (NORFLEX) 100 MG tablet Take 100 mg by mouth 2 (two) times daily as needed for muscle spasms. 11/22/18 12/22/18 Yes [provider]  polyethylene glycol powder (GLYCOLAX/MIRALAX) 17 GM/SCOOP powder Take 17 g by mouth daily. 07/13/09  Yes [provider]  predniSONE (DELTASONE) 10 MG tablet Take 10-40 mg by mouth as directed. Take 4 tabs by mouth on day 1, then 3 tabs on day 2 -5 then on day 6 -9 take  2 tablets, then 1 tablet on  day10-13. 11/22/18  Yes [provider]  rosuvastatin (CRESTOR) 10 MG tablet Take 10 mg by mouth daily. 10/19/18  Yes [provider]  senna (SENOKOT) 8.6 MG TABS tablet Take 1 tablet by mouth daily as needed for mild constipation.   Yes [provider]  traMADol (ULTRAM) 50 MG tablet Take 50 mg by mouth every 6 (six) hours as needed for pain. 11/22/18  Yes [provider]    Family History No family history on file.  Social History Social History   Tobacco Use   Smoking status: Never Smoker   Smokeless tobacco: Never Used  Substance Use Topics     Alcohol use: No   Drug use: No     Allergies   Amikacin, Ethambutol, Levaquin [levofloxacin], Parafon forte dsc [chlorzoxazone], Rifabutin, Tequin [gatifloxacin], and Zyvox [linezolid]   Review of Systems Review of Systems  Respiratory: Positive for shortness of breath.   Cardiovascular:       R sided rib pain  All other systems reviewed and are negative.    Physical Exam Updated Vital Signs BP 127/67    Pulse 72    Temp 99.2 F (37.3 C) (Oral)    Resp 18    SpO2 97%   Physical Exam Vitals signs and nursing note reviewed.  Constitutional:      General: She is not in acute distress.    Appearance: She is well-developed.     Comments: Appears uncomfortable and sob. nontoxic  HENT:     Head: Normocephalic and atraumatic.  Eyes:     Conjunctiva/sclera: Conjunctivae normal.     Pupils: Pupils are equal, round, and reactive to light.  Neck:     Musculoskeletal: Normal range of motion and neck supple.  Cardiovascular:     Rate and Rhythm: Normal rate and regular rhythm.     Pulses: Normal pulses.  Pulmonary:     Effort: Pulmonary effort is normal. Tachypnea present.     Breath sounds: Normal breath sounds.     Comments: Speaking in short, 2-3 word sentences due to pain.  Taking short shallow breaths.  Pain not reproducible with palpation of the right side chest wall.  Rales heard in bilateral lower lobes. Abdominal:     General: Bowel sounds are normal. There is no distension.     Palpations: Abdomen is soft.     Tenderness: There is no abdominal tenderness.  Musculoskeletal: Normal range of motion.  Skin:    General: Skin is warm and dry.  Neurological:     Mental Status: She is alert and oriented to person, place, and time.      ED Treatments / Results  Labs (all labs ordered are listed, but only abnormal results are displayed) Labs Reviewed  CBC WITH DIFFERENTIAL/PLATELET - Abnormal; Notable for the following components:      Result Value   WBC 20.2 (*)     Neutro Abs 18.2 (*)    Abs Immature Granulocytes 0.15 (*)    All other components within normal limits  COMPREHENSIVE METABOLIC PANEL - Abnormal; Notable for the following components:   Glucose, Bld 101 (*)    BUN 24 (*)    Total Protein 6.2 (*)    Albumin 2.9 (*)    GFR calc non Af Amer 56 (*)    All other components within normal limits  SARS CORONAVIRUS 2 (HOSPITAL ORDER, [place] [facility])  URINALYSIS, ROUTINE W REFLEX MICROSCOPIC  EKG None  Radiology Ct Angio Chest Pe W And/or Wo Contrast  Result Date: 11/27/2018 CLINICAL DATA:  Shortness of breath. History of MAC lung infection with recent back surgery 2 weeks ago. Right chest pain and shortness of breath. Concern for pulmonary embolism. EXAM: CT ANGIOGRAPHY CHEST WITH CONTRAST TECHNIQUE: Multidetector CT imaging of the chest was performed using the standard protocol during bolus administration of intravenous contrast. Multiplanar CT image reconstructions and MIPs were obtained to evaluate the vascular anatomy. CONTRAST:  135m OMNIPAQUE IOHEXOL 350 MG/ML SOLN COMPARISON:  None. FINDINGS: Cardiovascular: The pulmonary arteries are well opacified with contrast to the level of the subsegmental branches. There is no evidence of acute pulmonary embolism. Minimal atherosclerosis of the aorta and great vessels. No acute vascular findings are seen. The heart size is normal. There is no pericardial effusion. Mediastinum/Nodes: There is a prominent right hilar node measuring 13 mm on image 63/5. There is a subcarinal node measuring 15 mm on image 77/5. No other enlarged mediastinal, hilar or axillary lymph nodes are demonstrated. The thyroid gland, trachea and esophagus demonstrate no significant findings. Lungs/Pleura: There is no pleural effusion or pneumothorax. There is extensive chronic lung disease with mosaic attenuation, bronchiectasis, central airway thickening and mucous plugging. The bronchiectasis is greatest in  the right middle lobe, lingula and right lower lobe. There are scattered peribronchial inflammatory changes, greatest in the right upper lobe. No consolidation or suspicious pulmonary nodule identified. There is mild underlying emphysema. Upper abdomen: No significant findings are seen within the visualized upper abdomen. 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 Musculoskeletal/Chest wall: There is no chest wall mass or suspicious osseous finding. Review of the MIP images confirms the above findings. IMPRESSION: 1. No evidence of acute pulmonary embolism or other acute vascular process. 2. 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 Without comparison studies, it is difficult to exclude acute superimposed inflammation, especially in the right upper lobe. At least short term chest radiographic follow up recommended. CT follow-up in 3-6 months should be considered. 3. Mildly prominent right hilar and subcarinal lymph nodes, likely reactive. Electronically Signed   By: WRichardean SaleM.D.   On: 11/27/2018 16:00    Procedures Procedures (including critical care time)  Medications Ordered in ED Medications  lidocaine (LIDODERM) 5 % 1 patch (1 patch Transdermal Patch Applied 11/27/18 1311)  iohexol (OMNIPAQUE) 350 MG/ML injection 100 mL (100 mLs Intravenous Contrast Given 11/27/18 1513)     Initial Impression / Assessment and Plan / ED Course  I have reviewed the triage vital signs and the nursing notes.  Pertinent labs & imaging results that were available during my care of the patient were reviewed by me and considered in my medical decision making (see chart for details).        Patient presenting for evaluation of acute onset shortness of breath and chest pain.  Found to be hypoxic in the 80s.  Recent back surgery, complicated by respiratory issues following the surgery.  No fevers, chills, cough or other viral/COVID symptoms.  Concern for  PE.  Also consider pneumonia due to poor pain control.  Will obtain labs and CTA for further evaluation.  Lidoderm for pain control.  Labs show leukocytosis at 20.  Otherwise reassuring.  CTA pending.  CTA shows inflammation concerning for infection.  Especially in the setting of hypoxia, pain, and elevated white count, concern for pneumonia.  IV antibiotics started, and will call for admission.  Discussed with  hospitalist from [facility], pt to be admitted.   Final Clinical Impressions(s) / ED Diagnoses   Final diagnoses:  Pneumonia of right upper lobe due to infectious organism [facility])    ED Discharge Orders    None       Franchot Heidelberg, PA-C 11/27/18 1726    Julianne Rice, MD 12/04/18 [phone]

## 2018-11-27 NOTE — H&P (Signed)
Stacy Sanchez is an 73 y.o. female.   Chief Complaint: Right sided chest pain, Shortness of breath. HPI: The patient is a 73 yr old woman who carries a past medical history significant for MAC infection since 2001. She was diagnosed when she was living in [place], [place]. She was treated with Amikacin which caused her to have renal injury, and enthambutol which caused her to have peripheral neuropathy. She then moved to [facility]. She has been following with [facility] at [place]. She had been placed on inhaled gentamicin, but the patient stopped in in 05/2017.  The patient was recently admitted to [facility] 11/13/2018 - 11/22/2018 for surgery on her back. The patient tells me that prior to her being admitted [facility] that she had been diagnosed with thrush, although she had been on no steroids and no antibiotics at that time. In her post operative period the patient had severe respiratory decline with CO2 in the 70's and hypoxia. The patient tells me that after 3-4 days, pulmonology was consulted. Sputum cultures were obtained and showed moderate growth of pseudomonas and yeast on 11/21/2018. The patient was discharged to home on 11/22/2018 on a steroid taper. She was not discharged on antibiotics. This morning the patient developed severe right sided chest (rib) pain and severe shortness of breath. She was found to have SaO2 in the 70's by EMS. CTA of the chest was performed and demonstrated Right sided bronchiectasis and bronchial inflammation with a right upper lobe infiltrate.  She carries a past medical history significant for Scoliosis, Chronic MAC infection, Adverse Reactions to Ethambutol and Amikacin, CAD, hypothyroidism, CKD III, GAD, and Hypertension.  Past Medical History:  Diagnosis Date  . Arthritis   . Hypertension   . Mycobacterium abscessus infection   . Mycobacterium avium complex ([place])   . Thyroid disease     Past Surgical History:  Procedure Laterality  Date  . ABDOMINAL HYSTERECTOMY    . CARDIAC SURGERY    . TONSILLECTOMY      No family history on file. Social History:  reports that she has never smoked. She has never used smokeless tobacco. She reports that she does not drink alcohol or use drugs. (Not in a hospital admission)   Allergies:  Allergies  Allergen Reactions  . Amikacin Other (See Comments)    nephropathy   . Ethambutol Other (See Comments)    neuropathy  . Levaquin [Levofloxacin] Other (See Comments)    Fainting and weakness  . Parafon Forte Dsc [Chlorzoxazone] Hives  . Rifabutin Diarrhea  . Tequin [Gatifloxacin] Other (See Comments)    Fainting and weakness  . Zyvox [Linezolid] Diarrhea    Pertinent items noted in HPI and remainder of comprehensive ROS otherwise negative.   General appearance: alert, cooperative, cachectic and mild distress Head: Normocephalic, without obvious abnormality, atraumatic Eyes: conjunctivae/corneas clear. PERRL, EOM's intact. Fundi benign. Throat: lips, mucosa, and tongue normal; teeth and gums normal and white exudate on tongue. Neck: no adenopathy, no carotid bruit, no JVD, supple, symmetrical, trachea midline and thyroid not enlarged, symmetric, no tenderness/mass/nodules Resp: Positive for rales, rhonchi, and wheezes Right greater than left. Especially Right upper lobe. Chest wall: no tenderness Cardio: regular rate and rhythm, S1, S2 normal, no murmur, click, rub or gallop GI: soft, non-tender; bowel sounds normal; no masses,  no organomegaly Extremities: extremities normal, atraumatic, no cyanosis or edema Pulses: 2+ and symmetric Skin: Skin color, texture, turgor normal. No rashes or lesions Lymph nodes: Cervical, supraclavicular, and  axillary nodes normal. Neurologic: Grossly normal  Results for orders placed or performed during the hospital encounter of 11/27/18 (from the past 48 hour(s))  CBC with Differential     Status: Abnormal   Collection Time: 11/27/18 12:53  PM  Result Value Ref Range   WBC 20.2 (H) 4.0 - 10.5 K/uL   RBC 4.20 3.87 - 5.11 MIL/uL   Hemoglobin 12.9 12.0 - 15.0 g/dL   HCT 40.4 36.0 - 46.0 %   MCV 96.2 80.0 - 100.0 fL   MCH 30.7 26.0 - 34.0 pg   MCHC 31.9 30.0 - 36.0 g/dL   RDW 14.6 11.5 - 15.5 %   Platelets 255 150 - 400 K/uL   nRBC 0.0 0.0 - 0.2 %   Neutrophils Relative % 90 %   Neutro Abs 18.2 (H) 1.7 - 7.7 K/uL   Lymphocytes Relative 4 %   Lymphs Abs 0.8 0.7 - 4.0 K/uL   Monocytes Relative 5 %   Monocytes Absolute 1.0 0.1 - 1.0 K/uL   Eosinophils Relative 0 %   Eosinophils Absolute 0.0 0.0 - 0.5 K/uL   Basophils Relative 0 %   Basophils Absolute 0.0 0.0 - 0.1 K/uL   Immature Granulocytes 1 %   Abs Immature Granulocytes 0.15 (H) 0.00 - 0.07 K/uL    Comment: Performed at [facility], [address] [place], [place] [address]  Comprehensive metabolic panel     Status: Abnormal   Collection Time: 11/27/18 12:53 PM  Result Value Ref Range   Sodium 138 135 - 145 mmol/L   Potassium 4.1 3.5 - 5.1 mmol/L   Chloride 98 98 - 111 mmol/L   CO2 31 22 - 32 mmol/L   Glucose, Bld 101 (H) 70 - 99 mg/dL   BUN 24 (H) 8 - 23 mg/dL   Creatinine, Ser 0.98 0.44 - 1.00 mg/dL   Calcium 8.9 8.9 - 10.3 mg/dL   Total Protein 6.2 (L) 6.5 - 8.1 g/dL   Albumin 2.9 (L) 3.5 - 5.0 g/dL   AST 19 15 - 41 U/L   ALT 27 0 - 44 U/L   Alkaline Phosphatase 47 38 - 126 U/L   Total Bilirubin 0.8 0.3 - 1.2 mg/dL   GFR calc non Af Amer 56 (L) >60 mL/min   GFR calc Af Amer >60 >60 mL/min   Anion gap 9 5 - 15    Comment: Performed at [facility] [address], [place], [place] [address]  Urinalysis, Routine w reflex microscopic     Status: None   Collection Time: 11/27/18  2:50 PM  Result Value Ref Range   Color, Urine YELLOW YELLOW   APPearance CLEAR CLEAR   Specific Gravity, Urine 1.009 1.005 - 1.030   pH 7.0 5.0 - 8.0   Glucose, UA NEGATIVE NEGATIVE mg/dL   Hgb urine dipstick NEGATIVE NEGATIVE   Bilirubin Urine NEGATIVE NEGATIVE    Ketones, ur NEGATIVE NEGATIVE mg/dL   Protein, ur NEGATIVE NEGATIVE mg/dL   Nitrite NEGATIVE NEGATIVE   Leukocytes,Ua NEGATIVE NEGATIVE    Comment: Performed at [facility] [address] [place],  [address]   _0 @  Blood pressure 127/67, pulse 72, temperature 99.2 F (37.3 C), temperature source Oral, resp. rate 18, SpO2 97 %.    Assessment/Plan Problem  Acute Respiratory Failure With Hypoxia (Hcc)  Bronchiectasis With Acute Exacerbation (Hcc)  Pseudomonas Aeruginosa Infection  Thrush  Cad (Coronary Artery Disease)  Ckd (Chronic Kidney Disease), Stage III (  Hcc)  Essential Hypertension  Generalized Anxiety Disorder  Peripheral Neuropathy  Mycobacterium Avium Complex (Hcc)   Acute Respiratory Failure: The patient does not ordinarily require oxygen, although she does get short of breath while walking her dog. Today she is requiring 3L to maintain SaO2 of 94%. She had retention of CO2 at [facility], but that was felt to be due to narcotic pain medication. Will get ABG for any decrease in level of consciousness.  MAC infection: The patient has been under the care of Brigitte Pulse at [place], but has not seem him for over a year. She states that the last medication she received was inhaled gentamicin, but that she stopped it because she didn't think she needed it. Dr. Clovis Riley felt that her bronchietcasis was stable at the time. She now appears to be in exacerbation with her bronchiectasis. While at [facility] the patient was placed on IV solumedrol. This was converted to a prednisone taper when she was sent home 5 days ago. She is now taking 30 mg daily. Although this clearly can complicate and contribute to an exacerbation of her MAC, I fear that stopping it abruptly could place the patient in an adrenal crisis. I will continue her taper, but abrogate it.  HCAP: The patient will be treated with cefepime to cover for pseudomonas which grew out of a recent (11/21/2018) sputum at  [facility]. She will also be placed on Vancomycin as that hospitalization placed her at risk for HCAP. I will also give her diflucan for the growth of yeast for her sputum.  Thrush: Pt also grew yeast from her sputum on 11/21/2018 at [place]. I will add diflucan  CAD: Continue Aspiring, Nadolol (with parameters), and Crestor. She will be admitted to a telemetry bed.  CKD III: Due to Amikacin. Patient's GFR is likely at baseline at 56. I will monitor GFR, Creatinine, electrolytes, and volume status.  Generalized anxiety disorder/Depression: As needed Ativan and continue home meds for depression.  Peripheral Neuropathy: Due to Ethambutol. Continue neurontin  I have seen and examined this patient myself. I have spent 86 minutes in her evaluation and care.  Severity of Illness: The appropriate patient status for this patient is INPATIENT. Inpatient status is judged to be reasonable and necessary in order to provide the required intensity of service to ensure the patient's safety. The patient's presenting symptoms, physical exam findings, and initial radiographic and laboratory data in the context of their chronic comorbidities is felt to place them at high risk for further clinical deterioration. Furthermore, it is not anticipated that the patient will be medically stable for discharge from the hospital within 2 midnights of admission. The following factors support the patient status of inpatient.   " The patient's presenting symptoms include Shortness of breath, chest pain. " The worrisome physical exam findings include Rales and rhonchi and thrush. " The initial radiographic and laboratory data are worrisome because of Exacerbation of bronchiectasis and RUL infiltrate. " The chronic co-morbidities include Chronic MAC infection, CAD, Scoliosis, Hypertension, hypothyroidisim, GAD, Depression, CKD III .  * I certify that at the point of admission it is my clinical judgment that the patient  will require inpatient hospital care spanning beyond 2 midnights from the point of admission due to high intensity of service, high risk for further deterioration and high frequency of surveillance required.*  DVT prophylaxis: SCD's CODE STATUS: Full Code Family Communication: Discussed with the patient's daughter, [name], at length. Disposition: Home  Annalysse Shoemaker 11/27/2018, 6:43 PM

## 2018-11-28 DIAGNOSIS — J471 Bronchiectasis with (acute) exacerbation: Secondary | ICD-10-CM

## 2018-11-28 DIAGNOSIS — J9601 Acute respiratory failure with hypoxia: Secondary | ICD-10-CM

## 2018-11-28 DIAGNOSIS — J189 Pneumonia, unspecified organism: Secondary | ICD-10-CM

## 2018-11-28 LAB — BASIC METABOLIC PANEL
Anion gap: 10 (ref 5–15)
BUN: 28 mg/dL — ABNORMAL HIGH (ref 8–23)
CO2: 29 mmol/L (ref 22–32)
Calcium: 8.5 mg/dL — ABNORMAL LOW (ref 8.9–10.3)
Chloride: 96 mmol/L — ABNORMAL LOW (ref 98–111)
Creatinine, Ser: 0.93 mg/dL (ref 0.44–1.00)
GFR calc Af Amer: >60 mL/min (ref >60)
GFR calc non Af Amer: >60 mL/min (ref >60)
Glucose, Bld: 81 mg/dL (ref 70–99)
Potassium: 4.3 mmol/L (ref 3.5–5.1)
Sodium: 135 mmol/L (ref 135–145)

## 2018-11-28 LAB — CBC
HCT: 39.6 % (ref 36.0–46.0)
Hemoglobin: 12.7 g/dL (ref 12.0–15.0)
MCH: 31 pg (ref 26.0–34.0)
MCHC: 32.1 g/dL (ref 30.0–36.0)
MCV: 96.6 fL (ref 80.0–100.0)
Platelets: 263 10*3/uL (ref 150–400)
RBC: 4.1 MIL/uL (ref 3.87–5.11)
RDW: 14.8 % (ref 11.5–15.5)
WBC: 17.8 10*3/uL — ABNORMAL HIGH (ref 4.0–10.5)
nRBC: 0 % (ref 0.0–0.2)

## 2018-11-28 LAB — BLOOD GAS, ARTERIAL
Acid-Base Excess: 5.5 mmol/L — ABNORMAL HIGH (ref 0.0–2.0)
Bicarbonate: 30.1 mmol/L — ABNORMAL HIGH (ref 20.0–28.0)
Drawn by: 28341
O2 Content: 2 L/min
O2 Saturation: 95 %
Patient temperature: 98.6
pCO2 arterial: 48.6 mmHg — ABNORMAL HIGH (ref 32.0–48.0)
pH, Arterial: 7.408 (ref 7.350–7.450)
pO2, Arterial: 76.3 mmHg — ABNORMAL LOW (ref 83.0–108.0)

## 2018-11-28 LAB — EXPECTORATED SPUTUM ASSESSMENT W GRAM STAIN, RFLX TO RESP C

## 2018-11-28 MED ORDER — ADULT MULTIVITAMIN LIQUID CH
15.0000 mL | Freq: Every day | ORAL | Status: DC
  Administered 2018-11-29 – 2018-12-01 (×3): 15 mL via ORAL
  Filled 2018-11-28 (×3): qty 15

## 2018-11-28 MED ORDER — FLUCONAZOLE 100 MG PO TABS
200.0000 mg | ORAL_TABLET | Freq: Every day | ORAL | Status: DC
  Administered 2018-11-28 – 2018-12-01 (×4): 200 mg via ORAL
  Filled 2018-11-28 (×4): qty 2

## 2018-11-28 NOTE — Progress Notes (Signed)
Patient arrived to unit on bed around 2300. Patient alert and oriented X4. Patient on O2 Nasal Cannula 2L. Patient have a dry non productive cough. Diminish Lung sound. Active bowel sound and complained of right side pain. Patient is resting in bed at this time. Will continue to monitor.

## 2018-11-28 NOTE — Consult Note (Addendum)
[facility]ious [facility]  Total days of antibiotics 2 -vanco/cefepime/fluc               Reason for Consult: bronchiectasis flare    Referring Physician: swayze  Active Problems:   Acute respiratory failure with hypoxia ([place])   Bronchiectasis with acute exacerbation ([place])   Pseudomonas aeruginosa infection   Thrush   CAD (coronary artery disease)   CKD (chronic kidney disease), stage III ([place])   Essential hypertension   Generalized anxiety disorder   Peripheral neuropathy   Mycobacterium avium complex ([place])    HPI: Katoya Amato is a 73 y.o. 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 She last saw him in April 2019 , who she sees annually roughly. At their last visit, she has been  Fairly asymptomatic. However recently, has been having productive cough, does not use any nebulized saline to help with pulmonary hygiene. She recently underwent lumbar fusion-HW post op complicated by hypoxia nad was given abtx, and steroid taper on 8/2 to help with symptoms. She reports that her productive cough was much improved. Her cx grew PsA. On discharge she was taken off of oxygen, and not needing antibiotics on 8/2. At night, she noticed that her saturation would drop to 84% but does not know if this is her baseline, had not had oxygen at home. She developed severe right sided pleuretic chest pain with shortness of breath, found to have hypoxia in the 70s and readmitted for evaluation. CTA was negative for PE but had chronic signs of bronchiectasis. On admit, also has signs of thrush. She was started on cefepime/vanco/fluc. She is feeling much improved. Has improvement with pleuritic pain. She is not having much a productive cough. Chest ct per my read has bronchiectasis changes to right lung with some peripheral lesions that could account for pleuritic chest discomfort   Micro from 7/29 resp cx:    Organism Antibiotic Method Susceptibility  Pseudomonas aeruginosa Cefepime MIC <=1 ug/mL: Susceptible   Ciprofloxacin MIC 0.5 ug/mL: Susceptible   Gentamicin MIC <=1 ug/mL: Susceptible   Levofloxacin MIC <=0.12 ug/mL: Susceptible   Meropenem MIC <=0.25 ug/mL: Susceptible   Piperacillin + Tazobactam MIC <=4 ug/mL: Susceptible     Past Medical History:  Diagnosis Date   Arthritis    Hypertension    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[place])    Thyroid disease     Allergies:  Allergies  Allergen Reactions   Amikacin Other (See Comments)    nephropathy    Ethambutol Other (See Comments)    neuropathy   Levaquin [Levofloxacin] Other (See Comments)    Fainting and weakness   Parafon Forte Dsc [Chlorzoxazone] Hives   Rifabutin Diarrhea   Tequin [Gatifloxacin] Other (See Comments)    Fainting and weakness   Zyvox [Linezolid] Diarrhea   MEDICATIONS:  amLODipine  5 mg Oral Daily   aspirin EC  81 mg Oral Daily   escitalopram  5 mg Oral Daily   gabapentin  300 mg Oral BID   guaiFENesin  1,200 mg Oral BID   ipratropium-albuterol  3 mL Nebulization Q6H   levothyroxine  50 mcg Oral QAC breakfast   lidocaine  1 patch Transdermal Q24H   multivitamin with minerals  1 tablet Oral Daily   nadolol  10 mg Oral BID   polyethylene glycol  17 g Oral Daily   [START ON 11/29/2018]  predniSONE  10 mg Oral Q breakfast   [START ON 11/30/2018] predniSONE  5 mg Oral Q breakfast   rosuvastatin  10 mg Oral Daily    Social History   Tobacco Use   Smoking status: Never Smoker   Smokeless tobacco: Never Used  Substance Use Topics   Alcohol use: No   Drug use: No    Family hx:  Alcohol abuse in her father and mother; Depression in her mother; Glaucoma in her paternal aunt; No Known Problems in her daughter, daughter, and son; Pneumonia in her father.  Review of Systems -  12 point ros is negative except what is mentioned in  hpi.  OBJECTIVE: Temp:  [98 F (36.7 C)-98.7 F (37.1 C)] 98.6 F (37 C) (08/08 1200) Pulse Rate:  [53-78] 78 (08/08 1336) Resp:  [16-18] 18 (08/08 1336) BP: (116-147)/(63-90) 118/68 (08/08 1200) SpO2:  [96 %-99 %] 98 % (08/08 1200) Weight:  [69.7 kg] 69.7 kg (08/07 1900) Physical Exam  Constitutional:  oriented to person, place, and time. appears well-developed and well-nourished. No distress.  HENT: [place]/AT, PERRLA, no scleral icterus Mouth/Throat: Oropharynx is clear and moist. No oropharyngeal exudate.  Cardiovascular: Normal rate, regular rhythm and normal heart sounds. Exam reveals no gallop and no friction rub.  No murmur heard.  Pulmonary/Chest: Effort normal and breath sounds normal. No respiratory distress.  has no wheezes. Decrease BS posterior right mid lung field Neck = supple, no nuchal rigidity Abdominal: Soft. Bowel sounds are normal.  exhibits no distension. There is no tenderness.  Lymphadenopathy: no cervical adenopathy. No axillary adenopathy Neurological: alert and oriented to person, place, and time.  Back = surgical incision is healing well. Has lower midline echymosis from surgery  Skin: Skin is warm and dry. No rash noted. No erythema.  Psychiatric: a normal mood and affect.  behavior is normal.   LABS: Results for orders placed or performed during the hospital encounter of 11/27/18 (from the past 48 hour(s))  CBC with Differential     Status: Abnormal   Collection Time: 11/27/18 12:53 PM  Result Value Ref Range   WBC 20.2 (H) 4.0 - 10.5 K/uL   RBC 4.20 3.87 - 5.11 MIL/uL   Hemoglobin 12.9 12.0 - 15.0 g/dL   HCT 40.4 36.0 - 46.0 %   MCV 96.2 80.0 - 100.0 fL   MCH 30.7 26.0 - 34.0 pg   MCHC 31.9 30.0 - 36.0 g/dL   RDW 14.6 11.5 - 15.5 %   Platelets 255 150 - 400 K/uL   nRBC 0.0 0.0 - 0.2 %   Neutrophils Relative % 90 %   Neutro Abs 18.2 (H) 1.7 - 7.7 K/uL   Lymphocytes Relative 4 %   Lymphs Abs 0.8 0.7 - 4.0 K/uL   Monocytes Relative 5 %    Monocytes Absolute 1.0 0.1 - 1.0 K/uL   Eosinophils Relative 0 %   Eosinophils Absolute 0.0 0.0 - 0.5 K/uL   Basophils Relative 0 %   Basophils Absolute 0.0 0.0 - 0.1 K/uL   Immature Granulocytes 1 %   Abs Immature Granulocytes 0.15 (H) 0.00 - 0.07 K/uL    Comment: Performed at [facility], [address] [place], [place] [address]  Comprehensive metabolic panel     Status: Abnormal   Collection Time: 11/27/18 12:53 PM  Result Value Ref Range   Sodium 138 135 - 145 mmol/L   Potassium 4.1 3.5 - 5.1 mmol/L   Chloride 98 98 - 111 mmol/L  CO2 31 22 - 32 mmol/L   Glucose, Bld 101 (H) 70 - 99 mg/dL   BUN 24 (H) 8 - 23 mg/dL   Creatinine, Ser 0.98 0.44 - 1.00 mg/dL   Calcium 8.9 8.9 - 10.3 mg/dL   Total Protein 6.2 (L) 6.5 - 8.1 g/dL   Albumin 2.9 (L) 3.5 - 5.0 g/dL   AST 19 15 - 41 U/L   ALT 27 0 - 44 U/L   Alkaline Phosphatase 47 38 - 126 U/L   Total Bilirubin 0.8 0.3 - 1.2 mg/dL   GFR calc non Af Amer 56 (L) >60 mL/min   GFR calc Af Amer >60 >60 mL/min   Anion gap 9 5 - 15    Comment: Performed at [facility] [address] [place], [place] [address]  Urinalysis, Routine w reflex microscopic     Status: None   Collection Time: 11/27/18  2:50 PM  Result Value Ref Range   Color, Urine YELLOW YELLOW   APPearance CLEAR CLEAR   Specific Gravity, Urine 1.009 1.005 - 1.030   pH 7.0 5.0 - 8.0   Glucose, UA NEGATIVE NEGATIVE mg/dL   Hgb urine dipstick NEGATIVE NEGATIVE   Bilirubin Urine NEGATIVE NEGATIVE   Ketones, ur NEGATIVE NEGATIVE mg/dL   Protein, ur NEGATIVE NEGATIVE mg/dL   Nitrite NEGATIVE NEGATIVE   Leukocytes,Ua NEGATIVE NEGATIVE    Comment: Performed at [facility] [address], [place], [place] [address]  SARS Coronavirus 2 [facility] order, Performed in [facility] hospital lab) Nasopharyngeal Nasopharyngeal Swab     Status: None   Collection Time: 11/27/18  7:10 PM   Specimen: Nasopharyngeal Swab  Result Value Ref Range   SARS Coronavirus 2  NEGATIVE NEGATIVE    Comment: (NOTE) If result is NEGATIVE SARS-CoV-2 target nucleic acids are NOT DETECTED. The SARS-CoV-2 RNA is generally detectable in upper and lower  respiratory specimens during the acute phase of infection. The lowest  concentration of SARS-CoV-2 viral copies this assay can detect is 250  copies / mL. A negative result does not preclude SARS-CoV-2 infection  and should not be used as the sole basis for treatment or other  patient management decisions.  A negative result may occur with  improper specimen collection / handling, submission of specimen other  than nasopharyngeal swab, presence of viral mutation(s) within the  areas targeted by this assay, and inadequate number of viral copies  (<250 copies / mL). A negative result must be combined with clinical  observations, patient history, and epidemiological information. If result is POSITIVE SARS-CoV-2 target nucleic acids are DETECTED. The SARS-CoV-2 RNA is generally detectable in upper and lower  respiratory specimens dur ing the acute phase of infection.  Positive  results are indicative of active infection with SARS-CoV-2.  Clinical  correlation with patient history and other diagnostic information is  necessary to determine patient infection status.  Positive results do  not rule out bacterial infection or co-infection with other viruses. If result is PRESUMPTIVE POSTIVE SARS-CoV-2 nucleic acids MAY BE PRESENT.   A presumptive positive result was obtained on the submitted specimen  and confirmed on repeat testing.  While 2019 novel coronavirus  (SARS-CoV-2) nucleic acids may be present in the submitted sample  additional confirmatory testing may be necessary for epidemiological  and / or clinical management purposes  to differentiate between  SARS-CoV-2 and other Sarbecovirus currently known to infect humans.  If clinically indicated additional testing with an alternate test  methodology (  [id number]) is  advised. The SARS-CoV-2 RNA is generally  detectable in upper and lower respiratory sp ecimens during the acute  phase of infection. The expected result is Negative. Fact Sheet for Patients:  StrictlyIdeas.no Fact Sheet for Healthcare Providers: BankingDealers.co.za This test is not yet approved or cleared by the [place] FDA and has been authorized for detection and/or diagnosis of SARS-CoV-2 by FDA under an Emergency Use Authorization (EUA).  This EUA will remain in effect (meaning this test can be used) for the duration of the COVID-19 declaration under Section 564(b)(1) of the Act, 21 U.S.C. section 360bbb-3(b)(1), unless the authorization is terminated or revoked sooner. Performed at [facility], [place] [address] [place], [place] [address]   Basic metabolic panel     Status: Abnormal   Collection Time: 11/28/18  2:08 AM  Result Value Ref Range   Sodium 135 135 - 145 mmol/L   Potassium 4.3 3.5 - 5.1 mmol/L   Chloride 96 (L) 98 - 111 mmol/L   CO2 29 22 - 32 mmol/L   Glucose, Bld 81 70 - 99 mg/dL   BUN 28 (H) 8 - 23 mg/dL   Creatinine, Ser 0.93 0.44 - 1.00 mg/dL   Calcium 8.5 (L) 8.9 - 10.3 mg/dL   GFR calc non Af Amer >60 >60 mL/min   GFR calc Af Amer >60 >60 mL/min   Anion gap 10 5 - 15    Comment: Performed at [facility], [place] [address], [place], [place] [address]  CBC     Status: Abnormal   Collection Time: 11/28/18  2:08 AM  Result Value Ref Range   WBC 17.8 (H) 4.0 - 10.5 K/uL   RBC 4.10 3.87 - 5.11 MIL/uL   Hemoglobin 12.7 12.0 - 15.0 g/dL   HCT 39.6 36.0 - 46.0 %   MCV 96.6 80.0 - 100.0 fL   MCH 31.0 26.0 - 34.0 pg   MCHC 32.1 30.0 - 36.0 g/dL   RDW 14.8 11.5 - 15.5 %   Platelets 263 150 - 400 K/uL   nRBC 0.0 0.0 - 0.2 %    Comment: Performed at [facility], [place] [address], [place],  [address]  Blood gas, arterial     Status: Abnormal   Collection Time: 11/28/18  4:52 AM  Result  Value Ref Range   O2 Content 2.0 L/min   Delivery systems NASAL CANNULA    pH, Arterial 7.408 7.350 - 7.450   pCO2 arterial 48.6 (H) 32.0 - 48.0 mmHg   pO2, Arterial 76.3 (L) 83.0 - 108.0 mmHg   Bicarbonate 30.1 (H) 20.0 - 28.0 mmol/L   Acid-Base Excess 5.5 (H) 0.0 - 2.0 mmol/L   O2 Saturation 95.0 %   Patient temperature 98.6    Collection site RIGHT RADIAL    Drawn by [phone]    Sample type ARTERY    Allens test (pass/fail) PASS PASS    MICRO: reviewed IMAGING: Ct Angio Chest Pe W And/or Wo Contrast  Result Date: 11/27/2018 CLINICAL DATA:  Shortness of breath. History of MAC lung infection with recent back surgery 2 weeks ago. Right chest pain and shortness of breath. Concern for pulmonary embolism. EXAM: CT ANGIOGRAPHY CHEST WITH CONTRAST TECHNIQUE: Multidetector CT imaging of the chest was performed using the standard protocol during bolus administration of intravenous contrast. Multiplanar CT image reconstructions and MIPs were obtained to evaluate the vascular anatomy. CONTRAST:  163m OMNIPAQUE IOHEXOL 350 MG/ML SOLN COMPARISON:  None. FINDINGS: Cardiovascular:  The pulmonary arteries are well opacified with contrast to the level of the subsegmental branches. There is no evidence of acute pulmonary embolism. Minimal atherosclerosis of the aorta and great vessels. No acute vascular findings are seen. The heart size is normal. There is no pericardial effusion. Mediastinum/Nodes: There is a prominent right hilar node measuring 13 mm on image 63/5. There is a subcarinal node measuring 15 mm on image 77/5. No other enlarged mediastinal, hilar or axillary lymph nodes are demonstrated. The thyroid gland, trachea and esophagus demonstrate no significant findings. Lungs/Pleura: There is no pleural effusion or pneumothorax. There is extensive chronic lung disease with mosaic attenuation, bronchiectasis, central airway thickening and mucous plugging. The bronchiectasis is greatest in the right middle  lobe, lingula and right lower lobe. There are scattered peribronchial inflammatory changes, greatest in the right upper lobe. No consolidation or suspicious pulmonary nodule identified. There is mild underlying emphysema. Upper abdomen: No significant findings are seen within the visualized upper abdomen. 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 Musculoskeletal/Chest wall: There is no chest wall mass or suspicious osseous finding. Review of the MIP images confirms the above findings. IMPRESSION: 1. No evidence of acute pulmonary embolism or other acute vascular process. 2. 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 Without comparison studies, it is difficult to exclude acute superimposed inflammation, especially in the right upper lobe. At least short term chest radiographic follow up recommended. CT follow-up in 3-6 months should be considered. 3. Mildly prominent right hilar and subcarinal lymph nodes, likely reactive. Electronically Signed   By: Richardean Sale M.D.   On: 11/27/2018 16:00    Assessment/Plan:  73yo F with hx bronchiectasis with likely exacerbation following surgery  Bronchiectasis flare = recommend to treat with cefepime given colonization and recently isolated on sputum cx prior to discharge from outside hospital. Can discontinue vancomycin.recheck sputum culture to see if still PsA vs other pathogen to treat.  Recommend to ambulate to see if she is still hypoxic and may need short course of home oxygenation   Thrush = would do 7 d with fluconazole 287m po   Hx of pulmonary MAC = at this time would recommend repeat sputum AFB cx to see if can re-isolate pathogen and would be useful with next appt with dr stout.   pleuretic chest discomfort = improved on lidocaine patch

## 2018-11-28 NOTE — Progress Notes (Signed)
PROGRESS NOTE  Dickie Labarre KRC:[id number] DOB: 1945-12-01 DOA: 11/27/2018 PCP: Patient, No Pcp Per  Brief History   The patient is a 73 yr old woman who carries a past medical history significant for MAC infection since 2001. She was diagnosed when she was living in [place], [place]. She was treated with Amikacin which caused her to have renal injury, and enthambutol which caused her to have peripheral neuropathy. She then moved to [facility]. She has been following with Infectious [facility] at [place]. She had been placed on inhaled gentamicin, but the patient stopped in in 05/2017.  The patient was recently admitted to [facility] 11/13/2018 - 11/22/2018 for surgery on her back. The patient tells me that prior to her being admitted to [facility] that she had been diagnosed with thrush, although she had been on no steroids and no antibiotics at that time. In her post operative period the patient had severe respiratory decline with CO2 in the 70's and hypoxia. The patient tells me that after 3-4 days, pulmonology was consulted. Sputum cultures were obtained and showed moderate growth of pseudomonas and yeast on 11/21/2018. The patient was discharged to home on 11/22/2018 on a steroid taper. She was not discharged on antibiotics. This morning the patient developed severe right sided chest (rib) pain and severe shortness of breath. She was found to have SaO2 in the 70's by EMS. CTA of the chest was performed and demonstrated Right sided bronchiectasis and bronchial inflammation with a right upper lobe infiltrate.  She carries a past medical history significant for Scoliosis, Chronic MAC infection, Adverse Reactions to Ethambutol and Amikacin, CAD, hypothyroidism, CKD III, GAD, and Hypertension.  Infectious disease has been consulted. They have recommended reculture of sputum for AFB and standard sputum cultures. Dr. Baxter Flattery also recommended discontinuation of vancomycin, continuation of cefepime  and conversion of diflucan to oral.   Pulmonology has also been consulted. REcommendatino has been for bronchial hygeine, mobilization, continuation of bronchodilator, and weaning off of steroids.  Consultants  . Infectious Disease . Pulmonology  Procedures  . None  Antibiotics   Anti-infectives (From admission, onward)   Start     Dose/Rate Route Frequency Ordered Stop   11/28/18 2200  vancomycin (VANCOCIN) IVPB 1000 mg/200 mL premix  Status:  Discontinued     1,000 mg 200 mL/hr over 60 Minutes Intravenous Every 24 hours 11/27/18 1958 11/28/18 1725   11/28/18 1800  fluconazole (DIFLUCAN) tablet 200 mg     200 mg Oral Daily 11/28/18 1724     11/27/18 2200  ceFEPIme (MAXIPIME) 2 g in sodium chloride 0.9 % 100 mL IVPB     2 g 200 mL/hr over 30 Minutes Intravenous Every 12 hours 11/27/18 2001     11/27/18 2030  vancomycin (VANCOCIN) 1,500 mg in sodium chloride 0.9 % 500 mL IVPB     1,500 mg 250 mL/hr over 120 Minutes Intravenous  Once 11/27/18 1958 11/28/18 0018   11/27/18 2000  fluconazole (DIFLUCAN) IVPB 200 mg  Status:  Discontinued     200 mg 100 mL/hr over 60 Minutes Intravenous Every 24 hours 11/27/18 1929 11/28/18 0926    .   Subjective  The patient states that she is feeling better. No new complaints.  Objective   Vitals:  Vitals:   11/28/18 1336 11/28/18 1600  BP:  127/74  Pulse: 78 74  Resp: 18 18  Temp:  98.7 F (37.1 C)  SpO2:  99%    Exam:  Constitutional:  .  The patient is awake, alert, and oriented x 3. No acute distress. Respiratory:  . Positive for mild dyspnea with conversation . Positive for diminished breath sounds bilaterally, small wheezes and rhonchi are auscultated. No rales.  . No tactile fremitus Cardiovascular:  . Regular rate and rhythm . No murmurs, ectopy, or gallups. . No lateral PMI. No thrills. Abdomen:  . Abdomen is soft, non-tender, non-distended. . No hernias, masses, or organomegaly. . Normoactive bowel sounds.   Musculoskeletal:  . No cyanosis, clubbing, or edema Skin:  . No rashes, lesions, ulcers . palpation of skin: no induration or nodules Neurologic:  . CN 2-12 intact . Sensation all 4 extremities intact Psychiatric:  . Mental status o Mood, affect appropriate o Orientation to person, place, time  . judgment and insight appear intact   I have personally reviewed the following:   Today's Data  . Vitals, ABG, BMP, and CBC  Micro Data  . Pending .  Scheduled Meds: . amLODipine  5 mg Oral Daily  . aspirin EC  81 mg Oral Daily  . escitalopram  5 mg Oral Daily  . fluconazole  200 mg Oral Daily  . gabapentin  300 mg Oral BID  . guaiFENesin  1,200 mg Oral BID  . ipratropium-albuterol  3 mL Nebulization Q6H  . levothyroxine  50 mcg Oral QAC breakfast  . lidocaine  1 patch Transdermal Q24H  . multivitamin  15 mL Oral Daily  . nadolol  10 mg Oral BID  . polyethylene glycol  17 g Oral Daily  . [START ON 11/29/2018] predniSONE  10 mg Oral Q breakfast  . [START ON 11/30/2018] predniSONE  5 mg Oral Q breakfast  . rosuvastatin  10 mg Oral Daily   Continuous Infusions: . sodium chloride 75 mL/hr at 11/27/18 2042  . ceFEPime (MAXIPIME) IV 2 g (11/28/18 0853)    Active Problems:   Acute respiratory failure with hypoxia (HCC)   Bronchiectasis with acute exacerbation (HCC)   Pseudomonas aeruginosa infection   Thrush   CAD (coronary artery disease)   CKD (chronic kidney disease), stage III (H[facility])   Essential hypertension   Generalized anxiety disorder   Peripheral neuropathy   Mycobacterium avium complex ([place])   LOS: 1 day   A & P  Acute Respiratory Failure: The patient does not ordinarily require oxygen, although she does get short of breath while walking her dog. Today she is requiring 3L to maintain SaO2 of 94%. She had retention of CO2 at [facility], but that was felt to be due to narcotic pain medication. Will get ABG for any decrease in level of consciousness. Pulmonology has also  been consulted. REcommendatino has been for bronchial hygeine, mobilization, continuation of bronchodilator, and weaning off of steroids.  MAC infection: The patient has been under the care of Brigitte Pulse at [facility], but has not seem him for over a year. She states that the last medication she received was inhaled gentamicin, but that she stopped it because she didn't think she needed it. Dr. Clovis Riley felt that her bronchietcasis was stable at the time. She now appears to be in exacerbation with her bronchiectasis. While at [facility] the patient was placed on IV solumedrol. This was converted to a prednisone taper when she was sent home 5 days ago. She is now taking 30 mg daily. Although this clearly can complicate and contribute to an exacerbation of her MAC, I fear that stopping it abruptly could place the patient in an adrenal crisis. I will continue  her taper, but abrogate it. Infectious disease has been consulted. They have recommended reculture of sputum for AFB and standard sputum cultures. Dr. Baxter Flattery also recommended discontinuation of vancomycin, continuation of cefepime and conversion of diflucan to oral.   HCAP: The patient will be treated with cefepime to cover for pseudomonas which grew out of a recent (11/21/2018) sputum at [facility]. She will also be placed on Vancomycin has been discontinued at the recommendation of ID.She has been continued on oral diflucan for the growth of yeast for her sputum. Mobilize patient.  Thrush: Pt also grew yeast from her sputum on 11/21/2018 at Center. I will add diflucan  CAD: Continue Aspiring, Nadolol (with parameters), and Crestor. She will be admitted to a telemetry bed.  CKD III: Due to Amikacin. Patient's GFR is likely at baseline at 56. I will monitor GFR, Creatinine, electrolytes, and volume status.  Generalized anxiety disorder/Depression: As needed Ativan and continue home meds for depression.  Peripheral Neuropathy: Due to Ethambutol.  Continue neurontin  I have seen and examined this patient myself. I have spent 38 minutes in her evaluation and care.  DVT prophylaxis: SCD's Code Status: Full Code Family Communication: Daughter, [name] at bedside. Disposition Plan: Home  Neel Buffone, DO [facility] Direct contact: see [email or web address]  7PM-7AM contact night coverage as above 11/28/2018, 5:25 PM  LOS: 1 day

## 2018-11-28 NOTE — Consult Note (Signed)
Name: Stacy Sanchez MRN: [id number] DOB: 02-15-1946    ADMISSION DATE:  11/27/2018 CONSULTATION DATE: 11/28/2018  REFERRING MD : [facility]  CHIEF COMPLAINT: Chest pain shortness of breath  BRIEF PATIENT DESCRIPTION: 73 year old female no acute distress.  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  She has been on multiple antimicrobial antifungal treatments in the past.  Clarithromycin, ,Etahbutol ,Rifampin and inhaled gentamycin.  Note she is followed at [facility] by infectious disease.  Not been on any type of antimicrobial therapy for the last year.  She is not O2 dependent. She underwent a multilevel laminectomy L1-4 with insertions of rods for scoliosis.  She did well following surgery but 2 days later developed respiratory distress and subsequently pulmonology was required to command she was treated with antimicrobial therapy steroids bronchodilators.  She had been on oxygen while in the hospital and was continued on oxygen up until today she was discharged home but without oxygen as an outpatient. She denies fevers chills sweats cough or sputum production.  He does describe pain right chest that worsens with cough.  Is currently better now that she is been treated with narcotics and lidocaine patch.  This pain does not worsen when direct palpation is involved.  He does get worse with deep breath and coughing.  No reports of hemoptysis.  Reports increasing shortness of breath with activity but reports that is better now she is been treated with antimicrobial therapy and continuation of steroids. She underwent CT scan which did not show any evidence of pulmonary embolism or other acute vascular process.  He does have extensive chronic lung disease compatible with her history of  atypical Mycobacterium infection.  We have no studies to compare against.  No overt signs of infection is noted on CT scan. Pulmonary critical care asked to evaluate patient expressed an interest in having pulmonary follow-up as an outpatient.  PAST MEDICAL HISTORY :   has a past medical history of Arthritis, Hypertension, Mycobacterium abscessus infection, Mycobacterium avium complex ([place]), and Thyroid disease.  has a past surgical history that includes Cardiac surgery; Abdominal hysterectomy; and Tonsillectomy. Prior to Admission medications   Medication Sig Start Date End Date Taking? Authorizing Provider  acetaminophen (TYLENOL) 650 MG CR tablet Take 650 mg by mouth every 8 (eight) hours as needed for pain.   Yes [provider]  amLODipine (NORVASC) 5 MG tablet Take 5 mg by mouth daily.    Yes [provider]  aspirin EC 81 MG tablet Take 81 mg by mouth daily.   Yes [provider]  escitalopram (LEXAPRO) 5 MG tablet Take 5 mg by mouth daily.   Yes [provider]  gabapentin (NEURONTIN) 300 MG capsule Take 300 mg by mouth 2 (two) times daily.   Yes [provider]  HYDROcodone-acetaminophen (NORCO/VICODIN) 5-325 MG tablet Take 0.5 tablets by mouth every 6 (six) hours as needed for moderate pain.   Yes [provider]  levothyroxine (SYNTHROID, LEVOTHROID) 50 MCG tablet Take 50 mcg by mouth daily before breakfast. Take 1 tablet by mouth daily except for Saturday and Sunday taking 2 tablets (122mg)   Yes [provider]  Multiple Vitamin (MULTIVITAMIN WITH MINERALS) TABS tablet Take 1 tablet by mouth daily.   Yes [provider]  nadolol (CORGARD) 20 MG tablet Take  10 mg by mouth 2 (two) times daily.    Yes [provider]  orphenadrine (NORFLEX) 100 MG tablet Take 100 mg by mouth 2 (two) times daily as needed for muscle spasms. 11/22/18 12/22/18 Yes [provider]  polyethylene glycol powder  (GLYCOLAX/MIRALAX) 17 GM/SCOOP powder Take 17 g by mouth daily. 07/13/09  Yes [provider]  predniSONE (DELTASONE) 10 MG tablet Take 10-40 mg by mouth as directed. Take 4 tabs by mouth on day 1, then 3 tabs on day 2 -5 then on day 6 -9 take 2 tablets, then 1 tablet on  day10-13. 11/22/18  Yes [provider]  rosuvastatin (CRESTOR) 10 MG tablet Take 10 mg by mouth daily. 10/19/18  Yes [provider]  senna (SENOKOT) 8.6 MG TABS tablet Take 1 tablet by mouth daily as needed for mild constipation.   Yes [provider]  traMADol (ULTRAM) 50 MG tablet Take 50 mg by mouth every 6 (six) hours as needed for pain. 11/22/18  Yes [provider]   Allergies  Allergen Reactions   Amikacin Other (See Comments)    nephropathy    Ethambutol Other (See Comments)    neuropathy   Levaquin [Levofloxacin] Other (See Comments)    Fainting and weakness   Parafon Forte Dsc [Chlorzoxazone] Hives   Rifabutin Diarrhea   Tequin [Gatifloxacin] Other (See Comments)    Fainting and weakness   Zyvox [Linezolid] Diarrhea    FAMILY HISTORY:  family history is not on file. SOCIAL HISTORY:  reports that she has never smoked. She has never used smokeless tobacco. She reports that she does not drink alcohol or use drugs.  REVIEW OF SYSTEMS:   Taken extensively see HPI for details   SUBJECTIVE:   VITAL SIGNS: Temp:  [98 F (36.7 C)-99.2 F (37.3 C)] 98.7 F (37.1 C) (08/08 0559) Pulse Rate:  [53-75] 62 (08/08 0848) Resp:  [16-25] 18 (08/08 0848) BP: (116-147)/(63-90) 143/74 (08/08 0559) SpO2:  [91 %-99 %] 96 % (08/08 0848) Weight:  [69.7 kg] 69.7 kg (08/07 1900)  PHYSICAL EXAMINATION: General: Frail elderly female who does not appear to be in acute distress Neuro: Grossly intact without focal deficit HEENT: Oropharynx is unremarkable no thrush is appreciated.  No JVD or lymphadenopathy is appreciated Cardiovascular: Heart sounds are regular regular rate  and rhythm.  Complains of chest pain along the right lateral ribs does not worsen with pressure does increase with coughing. Lungs: Diminished throughout Abdomen: Soft positive bowel Musculoskeletal: Intact Skin: Lumbar area shows a multilevel surgical intervention well approximated well-healed without signs of infection  Recent Labs  Lab 11/27/18 1253 11/28/18 0208  NA 138 135  K 4.1 4.3  CL 98 96*  CO2 31 29  BUN 24* 28*  CREATININE 0.98 0.93  GLUCOSE 101* 81   Recent Labs  Lab 11/27/18 1253 11/28/18 0208  HGB 12.9 12.7  HCT 40.4 39.6  WBC 20.2* 17.8*  PLT 255 263   Ct Angio Chest Pe W And/or Wo Contrast  Result Date: 11/27/2018 CLINICAL DATA:  Shortness of breath. History of MAC lung infection with recent back surgery 2 weeks ago. Right chest pain and shortness of breath. Concern for pulmonary embolism. EXAM: CT ANGIOGRAPHY CHEST WITH CONTRAST TECHNIQUE: Multidetector CT imaging of the chest was performed using the standard protocol during bolus administration of intravenous contrast. Multiplanar CT image reconstructions and MIPs were obtained to evaluate the vascular anatomy. CONTRAST:  133m OMNIPAQUE IOHEXOL 350 MG/ML SOLN COMPARISON:  None. FINDINGS: Cardiovascular:  The pulmonary arteries are well opacified with contrast to the level of the subsegmental branches. There is no evidence of acute pulmonary embolism. Minimal atherosclerosis of the aorta and great vessels. No acute vascular findings are seen. The heart size is normal. There is no pericardial effusion. Mediastinum/Nodes: There is a prominent right hilar node measuring 13 mm on image 63/5. There is a subcarinal node measuring 15 mm on image 77/5. No other enlarged mediastinal, hilar or axillary lymph nodes are demonstrated. The thyroid gland, trachea and esophagus demonstrate no significant findings. Lungs/Pleura: There is no pleural effusion or pneumothorax. There is extensive chronic lung disease with mosaic attenuation,  bronchiectasis, central airway thickening and mucous plugging. The bronchiectasis is greatest in the right middle lobe, lingula and right lower lobe. There are scattered peribronchial inflammatory changes, greatest in the right upper lobe. No consolidation or suspicious pulmonary nodule identified. There is mild underlying emphysema. Upper abdomen: No significant findings are seen within the visualized upper abdomen. 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 Musculoskeletal/Chest wall: There is no chest wall mass or suspicious osseous finding. Review of the MIP images confirms the above findings. IMPRESSION: 1. No evidence of acute pulmonary embolism or other acute vascular process. 2. 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 Without comparison studies, it is difficult to exclude acute superimposed inflammation, especially in the right upper lobe. At least short term chest radiographic follow up recommended. CT follow-up in 3-6 months should be considered. 3. Mildly prominent right hilar and subcarinal lymph nodes, likely reactive. Electronically Signed   By: Richardean Sale M.D.   On: 11/27/2018 16:00    ASSESSMENT/  PLAN: Active Problems:   Acute respiratory failure with hypoxia (HCC)   Bronchiectasis with acute exacerbation (HCC)   Pseudomonas aeruginosa infection   Thrush   CAD (coronary artery disease)   CKD (chronic kidney disease), stage III (HCC)   Essential hypertension   Generalized anxiety disorder   Peripheral neuropathy   Mycobacterium avium complex ([place])  Hypoxia in the setting of bronchiectasis history of Mycobacterium avium with recent multilevel laminectomy with rod insertion for scoliosis performed at [facility] and required pulmonary consult treatment with antimicrobial therapy and steroids.  Agree with antimicrobial therapy Do not suspect she will be able to give sputum culture due to pain with coughing and lack of sputum  production Pulmonary toilet as tolerated note she had recent back surgery and continue with complaints of chest pain radiating to the back.  Therefore chest percussion would not be an option at this time.  Incentive spirometer and flutter valve as tolerated Bronchodilators O2 therapy as needed to maintain sats greater than 88% Mobilize as able Note she is followed by infectious disease at [facility] and infectious diseases been called in consult [facility].  Recent multilevel back surgery performed at [facility] Per primary team  History of coronary artery disease Consider troponins with atypical chest pain that does not get worse with pressure or chest Per primary team  Hypertension Per primary team     Richardson Landry Candie Gintz ACNP Maryanna Shape PCCM Pager [phone] till 1 pm If no answer page [phone] 11/28/2018, 12:15 PM

## 2018-11-28 NOTE — Plan of Care (Signed)

## 2018-11-29 ENCOUNTER — Encounter (HOSPITAL_COMMUNITY): Payer: Self-pay | Admitting: Pulmonary Disease

## 2018-11-29 LAB — CBC WITH DIFFERENTIAL/PLATELET
Abs Immature Granulocytes: 0.09 10*3/uL — ABNORMAL HIGH (ref 0.00–0.07)
Basophils Absolute: 0 10*3/uL (ref 0.0–0.1)
Basophils Relative: 0 %
Eosinophils Absolute: 0.2 10*3/uL (ref 0.0–0.5)
Eosinophils Relative: 1 %
HCT: 35.8 % — ABNORMAL LOW (ref 36.0–46.0)
Hemoglobin: 11.6 g/dL — ABNORMAL LOW (ref 12.0–15.0)
Immature Granulocytes: 1 %
Lymphocytes Relative: 12 %
Lymphs Abs: 1.8 10*3/uL (ref 0.7–4.0)
MCH: 31.4 pg (ref 26.0–34.0)
MCHC: 32.4 g/dL (ref 30.0–36.0)
MCV: 96.8 fL (ref 80.0–100.0)
Monocytes Absolute: 1.2 10*3/uL — ABNORMAL HIGH (ref 0.1–1.0)
Monocytes Relative: 8 %
Neutro Abs: 12.1 10*3/uL — ABNORMAL HIGH (ref 1.7–7.7)
Neutrophils Relative %: 78 %
Platelets: 206 10*3/uL (ref 150–400)
RBC: 3.7 MIL/uL — ABNORMAL LOW (ref 3.87–5.11)
RDW: 14.6 % (ref 11.5–15.5)
WBC: 15.3 10*3/uL — ABNORMAL HIGH (ref 4.0–10.5)
nRBC: 0 % (ref 0.0–0.2)

## 2018-11-29 LAB — BASIC METABOLIC PANEL
Anion gap: 10 (ref 5–15)
BUN: 23 mg/dL (ref 8–23)
CO2: 25 mmol/L (ref 22–32)
Calcium: 8.2 mg/dL — ABNORMAL LOW (ref 8.9–10.3)
Chloride: 103 mmol/L (ref 98–111)
Creatinine, Ser: 0.8 mg/dL (ref 0.44–1.00)
GFR calc Af Amer: >60 mL/min (ref >60)
GFR calc non Af Amer: >60 mL/min (ref >60)
Glucose, Bld: 86 mg/dL (ref 70–99)
Potassium: 4.2 mmol/L (ref 3.5–5.1)
Sodium: 138 mmol/L (ref 135–145)

## 2018-11-29 MED ORDER — IPRATROPIUM-ALBUTEROL 0.5-2.5 (3) MG/3ML IN SOLN
3.0000 mL | Freq: Three times a day (TID) | RESPIRATORY_TRACT | Status: DC
  Administered 2018-11-29 – 2018-12-01 (×7): 3 mL via RESPIRATORY_TRACT
  Filled 2018-11-29 (×7): qty 3

## 2018-11-29 NOTE — Progress Notes (Signed)
Patient ambulatory in hallway at this time accompanied by daughter.  Patient using walker due to recent back surgery and is on 1L of oxygen while walking.  Patient noted to have steady gait at this time.

## 2018-11-29 NOTE — Progress Notes (Signed)
PROGRESS NOTE  Stacy Sanchez MGQ:[id number] DOB: 1945/09/11 DOA: 11/27/2018 PCP: Patient, No Pcp Per  Brief History   The patient is a 73 yr old woman who carries a past medical history significant for MAC infection since 2001. She was diagnosed when she was living in [place], [place]. She was treated with Amikacin which caused her to have renal injury, and enthambutol which caused her to have peripheral neuropathy. She then moved to [facility]. She has been following with Infectious Disease [facility]) at [place]. She had been placed on inhaled gentamicin, but the patient stopped in in 05/2017.  The patient was recently admitted to [facility] 11/13/2018 - 11/22/2018 for surgery on her back. The patient tells me that prior to her being admitted [facility] that she had been diagnosed with thrush, although she had been on no steroids and no antibiotics at that time. In her post operative period the patient had severe respiratory decline with CO2 in the 70's and hypoxia. The patient tells me that after 3-4 days, pulmonology was consulted. Sputum cultures were obtained and showed moderate growth of pseudomonas and yeast on 11/21/2018. The patient was discharged to home on 11/22/2018 on a steroid taper. She was not discharged on antibiotics. This morning the patient developed severe right sided chest (rib) pain and severe shortness of breath. She was found to have SaO2 in the 70's by EMS. CTA of the chest was performed and demonstrated Right sided bronchiectasis and bronchial inflammation with a right upper lobe infiltrate.  She carries a past medical history significant for Scoliosis, Chronic MAC infection, Adverse Reactions to Ethambutol and Amikacin, CAD, hypothyroidism, CKD III, GAD, and Hypertension.  Infectious disease has been consulted. They have recommended reculture of sputum for AFB and standard sputum cultures. Dr. Baxter Flattery also recommended discontinuation of vancomycin, continuation of cefepime  and conversion of diflucan to oral.   Pulmonology has also been consulted. REcommendatino has been for bronchial hygeine, mobilization, continuation of bronchodilator, and weaning off of steroids.  Consultants  . Infectious Disease . Pulmonology  Procedures  . None  Antibiotics   Anti-infectives (From admission, onward)   Start     Dose/Rate Route Frequency Ordered Stop   11/28/18 2200  vancomycin (VANCOCIN) IVPB 1000 mg/200 mL premix  Status:  Discontinued     1,000 mg 200 mL/hr over 60 Minutes Intravenous Every 24 hours 11/27/18 1958 11/28/18 1725   11/28/18 1800  fluconazole (DIFLUCAN) tablet 200 mg     200 mg Oral Daily 11/28/18 1724     11/27/18 2200  ceFEPIme (MAXIPIME) 2 g in sodium chloride 0.9 % 100 mL IVPB     2 g 200 mL/hr over 30 Minutes Intravenous Every 12 hours 11/27/18 2001     11/27/18 2030  vancomycin (VANCOCIN) 1,500 mg in sodium chloride 0.9 % 500 mL IVPB     1,500 mg 250 mL/hr over 120 Minutes Intravenous  Once 11/27/18 1958 11/28/18 0018   11/27/18 2000  fluconazole (DIFLUCAN) IVPB 200 mg  Status:  Discontinued     200 mg 100 mL/hr over 60 Minutes Intravenous Every 24 hours 11/27/18 1929 11/28/18 0926      Subjective  The patient is without new complaints.  Objective   Vitals:  Vitals:   11/29/18 1238 11/29/18 1438  BP: 118/75   Pulse: 66   Resp: 16   Temp: 98.6 F (37 C)   SpO2: 94% 94%    Exam:  Constitutional:  . The patient is awake, alert, and  oriented x 3. No acute distress. Respiratory:  . Positive for mild dyspnea with conversation . Positive for diminished breath sounds bilaterally, small wheezes and rhonchi are auscultated. No rales.  . No tactile fremitus Cardiovascular:  . Regular rate and rhythm . No murmurs, ectopy, or gallups. . No lateral PMI. No thrills. Abdomen:  . Abdomen is soft, non-tender, non-distended. . No hernias, masses, or organomegaly. . Normoactive bowel sounds.  Musculoskeletal:  . No cyanosis,  clubbing, or edema Skin:  . No rashes, lesions, ulcers . palpation of skin: no induration or nodules Neurologic:  . CN 2-12 intact . Sensation all 4 extremities intact Psychiatric:  . Mental status o Mood, affect appropriate o Orientation to person, place, time  . judgment and insight appear intact   I have personally reviewed the following:   Today's Data  . Vitals, ABG, BMP, and CBC  Micro Data  . Pending .  Scheduled Meds: . amLODipine  5 mg Oral Daily  . aspirin EC  81 mg Oral Daily  . escitalopram  5 mg Oral Daily  . fluconazole  200 mg Oral Daily  . gabapentin  300 mg Oral BID  . guaiFENesin  1,200 mg Oral BID  . ipratropium-albuterol  3 mL Nebulization TID  . levothyroxine  50 mcg Oral QAC breakfast  . lidocaine  1 patch Transdermal Q24H  . multivitamin  15 mL Oral Daily  . nadolol  10 mg Oral BID  . polyethylene glycol  17 g Oral Daily  . [START ON 11/30/2018] predniSONE  5 mg Oral Q breakfast  . rosuvastatin  10 mg Oral Daily   Continuous Infusions: . sodium chloride 75 mL/hr at 11/29/18 0433  . ceFEPime (MAXIPIME) IV 2 g (11/29/18 8850)    Active Problems:   Acute respiratory failure with hypoxia (HCC)   Bronchiectasis with acute exacerbation (HCC)   Pseudomonas aeruginosa infection   Thrush   CAD (coronary artery disease)   CKD (chronic kidney disease), stage III (HCC)   Essential hypertension   Generalized anxiety disorder   Peripheral neuropathy   Mycobacterium avium complex ([place])   LOS: 2 days   A & P  Acute Respiratory Failure: The patient does not ordinarily require oxygen, although she does get short of breath while walking her dog. Today she is requiring 3L to maintain SaO2 of 94%. She had retention of CO2 at [facility], but that was felt to be due to narcotic pain medication. Will get ABG for any decrease in level of consciousness. Pulmonology has also been consulted. REcommendatino has been for bronchial hygeine, mobilization, continuation of  bronchodilator, and weaning off of steroids.  MAC infection: The patient has been under the care of Brigitte Pulse at [place], but has not seem him for over a year. She states that the last medication she received was inhaled gentamicin, but that she stopped it because she didn't think she needed it. Dr. Clovis Riley felt that her bronchietcasis was stable at the time. She now appears to be in exacerbation with her bronchiectasis. While at [facility] the patient was placed on IV solumedrol. This was converted to a prednisone taper when she was sent home 5 days ago. She is now taking 30 mg daily. Although this clearly can complicate and contribute to an exacerbation of her MAC, I fear that stopping it abruptly could place the patient in an adrenal crisis. I will continue her taper, but abrogate it. Infectious disease has been consulted. They have recommended reculture of sputum for AFB  and standard sputum cultures. Dr. Baxter Flattery also recommended discontinuation of vancomycin, continuation of cefepime and conversion of diflucan to oral. Will repeat sputums as it seems that they were not sufficient for cultures.  HCAP: The patient will be treated with cefepime to cover for pseudomonas which grew out of a recent (11/21/2018) sputum at [facility]. She will also be placed on Vancomycin has been discontinued at the recommendation of ID.She has been continued on oral diflucan for the growth of yeast for her sputum. Mobilize patient.  Thrush: Pt also grew yeast from her sputum on 11/21/2018 at [facility]. I will add diflucan. Patient states that she is much improved.  CAD: Continue Aspiring, Nadolol (with parameters), and Crestor. She will be admitted to a telemetry bed.  CKD III: Due to Amikacin. Patient's GFR is likely at baseline at 56. I will monitor GFR, Creatinine, electrolytes, and volume status.  Generalized anxiety disorder/Depression: As needed Ativan and continue home meds for depression.  Peripheral  Neuropathy: Due to Ethambutol. Continue neurontin.  I have seen and examined this patient myself. I have spent 30 minutes in her evaluation and care.  DVT prophylaxis: SCD's Code Status: Full Code Family Communication: Daughter, [name] at bedside. Disposition Plan: Home  Zebulon Gantt, DO [facility] Direct contact: see [email or web address]  7PM-7AM contact night coverage as above 11/29/2018, 5:17 PM  LOS: 1 day

## 2018-11-29 NOTE — Progress Notes (Signed)
[facility]  Chief Complaint  Patient presents with  . Shortness of Breath    Constitutional:  BP 118/75 (BP Location: Left Arm)   Pulse 66   Temp 98.6 F (37 C) (Oral)   Resp 16   Ht 5\' 7"  (1.702 m)   Wt 69.7 kg   SpO2 94%   BMI 24.07 kg/m   Past Medical History:  Hypothyroidism, HTN, OA, Renal failure from amikacin, Shingles, Neuropathy from ethambutol, Osteopenia, Anxiety, Cataracts  Brief Summary:  Stacy Sanchez is a 73 y.o. female dx with MAI in 1941 complicated by bronchiectasis.  Followed by ID in [facility].  She isn't having pain in her chest anymore.  Still has cough, but not bringing up as much sputum.  Walked in hall yesterday.  Down to 1 liter oxygen at rest.   Physical Exam:   Appearance - well kempt   ENMT - clear nasal mucosa, midline nasal  septum, no oral exudates, no LAN, trachea midline  Respiratory - normal chest wall, normal respiratory effort, no accessory muscle use, no wheeze/rales, scattered inspiratory squeaks  CV - s1s2 regular rate and rhythm, no murmurs, no peripheral edema, radial pulses symmetric  GI - soft, non tender, no masses  Lymph - no adenopathy noted in neck and axillary areas  MSK - normal gait  Ext - no cyanosis, clubbing, or joint inflammation noted  Skin - no rashes, lesions, or ulcers  Neuro - normal strength, oriented x 3  Psych - normal mood and affect   Assessment/Plan:   Acute hypoxic respiratory failure from exacerbation of bronchiectasis with Rt upper lobe pneumonia with hx of MAI. - assess for home oxygen - f/u 2 view CXR 8/10 - wean off prednisone - f/u sputum cultures >> not sure she will be able to produce enough sputum at this time - ABx per ID - continue flutter valve, mucinex - scheduled BDs for now  Right pleuritic chest pain. - improving - wean prednisone - continue lidocaine patch   Updated pt's daughter at bedside   Chesley Mires, MD [place] Pager: [phone] 11/29/2018, 2:11 PM  Flow Sheet     Pulmonary tests:    Chest imaging:  CT angio chest 11/27/18 >> prominent adenopathy, mosaic attenuation, BTX, central airway thickening, mucus plugging, mild emphysema, density in RUL  Medications:     Past Surgical History:  She  has a past surgical history that includes Cardiac surgery; Abdominal hysterectomy; Tonsillectomy (1956); Bronchoscopy (2001); Colonoscopy (2008); Cyst removal trunk (1965); Dilation and curettage of uterus (1975); Hand surgery (Right, 1983); Patent ductus arterious repair (1952); and Varicose vein surgery (1972).   Family History:  Her family history includes Thyroid disease in her daughter.  Social History:  She  reports that she has never smoked. She has never used smokeless tobacco. She reports that she does not drink alcohol or use drugs.

## 2018-11-29 NOTE — Progress Notes (Signed)
[place] for Infectious Disease    Date of Admission:  11/27/2018   Total days of antibiotics 3           ID: Stacy Sanchez is a 73 y.o. female with bronchiectasis flare with pseudomonas Active Problems:   Acute respiratory failure with hypoxia (HCC)   Bronchiectasis with acute exacerbation (HCC)   Pseudomonas aeruginosa infection   Thrush   CAD (coronary artery disease)   CKD (chronic kidney disease), stage III (HCC)   Essential hypertension   Generalized anxiety disorder   Peripheral neuropathy   Mycobacterium avium complex ([place])    Subjective: Improving. Only small coughing bout this morning. Ambulated with sats of 95% after completion  Medications:  . amLODipine  5 mg Oral Daily  . aspirin EC  81 mg Oral Daily  . escitalopram  5 mg Oral Daily  . fluconazole  200 mg Oral Daily  . gabapentin  300 mg Oral BID  . guaiFENesin  1,200 mg Oral BID  . ipratropium-albuterol  3 mL Nebulization TID  . levothyroxine  50 mcg Oral QAC breakfast  . lidocaine  1 patch Transdermal Q24H  . multivitamin  15 mL Oral Daily  . nadolol  10 mg Oral BID  . polyethylene glycol  17 g Oral Daily  . [START ON 11/30/2018] predniSONE  5 mg Oral Q breakfast  . rosuvastatin  10 mg Oral Daily    Objective: Vital signs in last 24 hours: Temp:  [98.1 F (36.7 C)-98.7 F (37.1 C)] 98.6 F (37 C) (08/09 1238) Pulse Rate:  [54-82] 66 (08/09 1238) Resp:  [16-18] 16 (08/09 1238) BP: (118-160)/(75-79) 118/75 (08/09 1238) SpO2:  [94 %-99 %] 94 % (08/09 1438) Physical Exam  Constitutional:  oriented to person, place, and time. appears well-developed and well-nourished. No distress.  HENT: [place]/AT, PERRLA, no scleral icterus Mouth/Throat: Oropharynx is clear and moist. No oropharyngeal exudate.  Cardiovascular: Normal rate, regular rhythm and normal heart sounds. Exam reveals no gallop and no friction rub.  No murmur heard.  Pulmonary/Chest: Effort normal and breath sounds normal. No respiratory  distress.  has no wheezes.  Neck = supple, no nuchal rigidity Abdominal: Soft. Bowel sounds are normal.  exhibits no distension. There is no tenderness.  Lymphadenopathy: no cervical adenopathy. No axillary adenopathy Neurological: alert and oriented to person, place, and time.  Skin: Skin is warm and dry. No rash noted. No erythema.  Psychiatric: a normal mood and affect.  behavior is normal.   Lab Results Recent Labs    11/28/18 0208 11/29/18 0541  WBC 17.8* 15.3*  HGB 12.7 11.6*  HCT 39.6 35.8*  NA 135 138  K 4.3 4.2  CL 96* 103  CO2 29 25  BUN 28* 23  CREATININE 0.93 0.80   Liver Panel Recent Labs    11/27/18 1253  PROT 6.2*  ALBUMIN 2.9*  AST 19  ALT 27  ALKPHOS 47  BILITOT 0.8    Microbiology: Unable to submit sputum Studies/Results: No results found.   Assessment/Plan:   Hx of MAC, bronchiectasis = please have Respiratory therapy try to do nebulized saline for sputum specimen for AFB culture  Pseudomonas bronchiectasis exacerbation = recommend to get picc line tomorrow, finish out 7 more additional days of cefepime. Then pull picc line Would also consider having her start on daily hypertonic saline nebulizer as part of pulmonary hygiene  Will see back in the ID clinic in 10 days  [facility] for Infectious Diseases  Cell: [phone] Pager: [phone]  11/29/2018, 5:15 PM

## 2018-11-29 NOTE — Plan of Care (Signed)

## 2018-11-30 ENCOUNTER — Inpatient Hospital Stay (HOSPITAL_COMMUNITY): Payer: Medicare Other

## 2018-11-30 ENCOUNTER — Inpatient Hospital Stay: Payer: Self-pay

## 2018-11-30 LAB — BASIC METABOLIC PANEL
Anion gap: 9 (ref 5–15)
BUN: 21 mg/dL (ref 8–23)
CO2: 31 mmol/L (ref 22–32)
Calcium: 8.2 mg/dL — ABNORMAL LOW (ref 8.9–10.3)
Chloride: 98 mmol/L (ref 98–111)
Creatinine, Ser: 0.93 mg/dL (ref 0.44–1.00)
GFR calc Af Amer: >60 mL/min (ref >60)
GFR calc non Af Amer: >60 mL/min (ref >60)
Glucose, Bld: 86 mg/dL (ref 70–99)
Potassium: 4.5 mmol/L (ref 3.5–5.1)
Sodium: 138 mmol/L (ref 135–145)

## 2018-11-30 LAB — CBC WITH DIFFERENTIAL/PLATELET
Abs Immature Granulocytes: 0.05 10*3/uL (ref 0.00–0.07)
Basophils Absolute: 0.1 10*3/uL (ref 0.0–0.1)
Basophils Relative: 0 %
Eosinophils Absolute: 0.3 10*3/uL (ref 0.0–0.5)
Eosinophils Relative: 2 %
HCT: 37 % (ref 36.0–46.0)
Hemoglobin: 11.7 g/dL — ABNORMAL LOW (ref 12.0–15.0)
Immature Granulocytes: 0 %
Lymphocytes Relative: 16 %
Lymphs Abs: 2.2 10*3/uL (ref 0.7–4.0)
MCH: 30.5 pg (ref 26.0–34.0)
MCHC: 31.6 g/dL (ref 30.0–36.0)
MCV: 96.4 fL (ref 80.0–100.0)
Monocytes Absolute: 1 10*3/uL (ref 0.1–1.0)
Monocytes Relative: 7 %
Neutro Abs: 10.3 10*3/uL — ABNORMAL HIGH (ref 1.7–7.7)
Neutrophils Relative %: 75 %
Platelets: 237 10*3/uL (ref 150–400)
RBC: 3.84 MIL/uL — ABNORMAL LOW (ref 3.87–5.11)
RDW: 14.8 % (ref 11.5–15.5)
WBC: 13.9 10*3/uL — ABNORMAL HIGH (ref 4.0–10.5)
nRBC: 0 % (ref 0.0–0.2)

## 2018-11-30 LAB — EXPECTORATED SPUTUM ASSESSMENT W GRAM STAIN, RFLX TO RESP C

## 2018-11-30 IMAGING — CR CHEST - 2 VIEW
2 series · 2 of 2 positions shown · non-contrast
Comparison: CTA of the chest on [DATE]

CLINICAL DATA: Bronchiectasis, pulmonary ALEX DENISE, shortness of breath
and hypoxia.

EXAM:
CHEST - 2 VIEW

[chest pa]
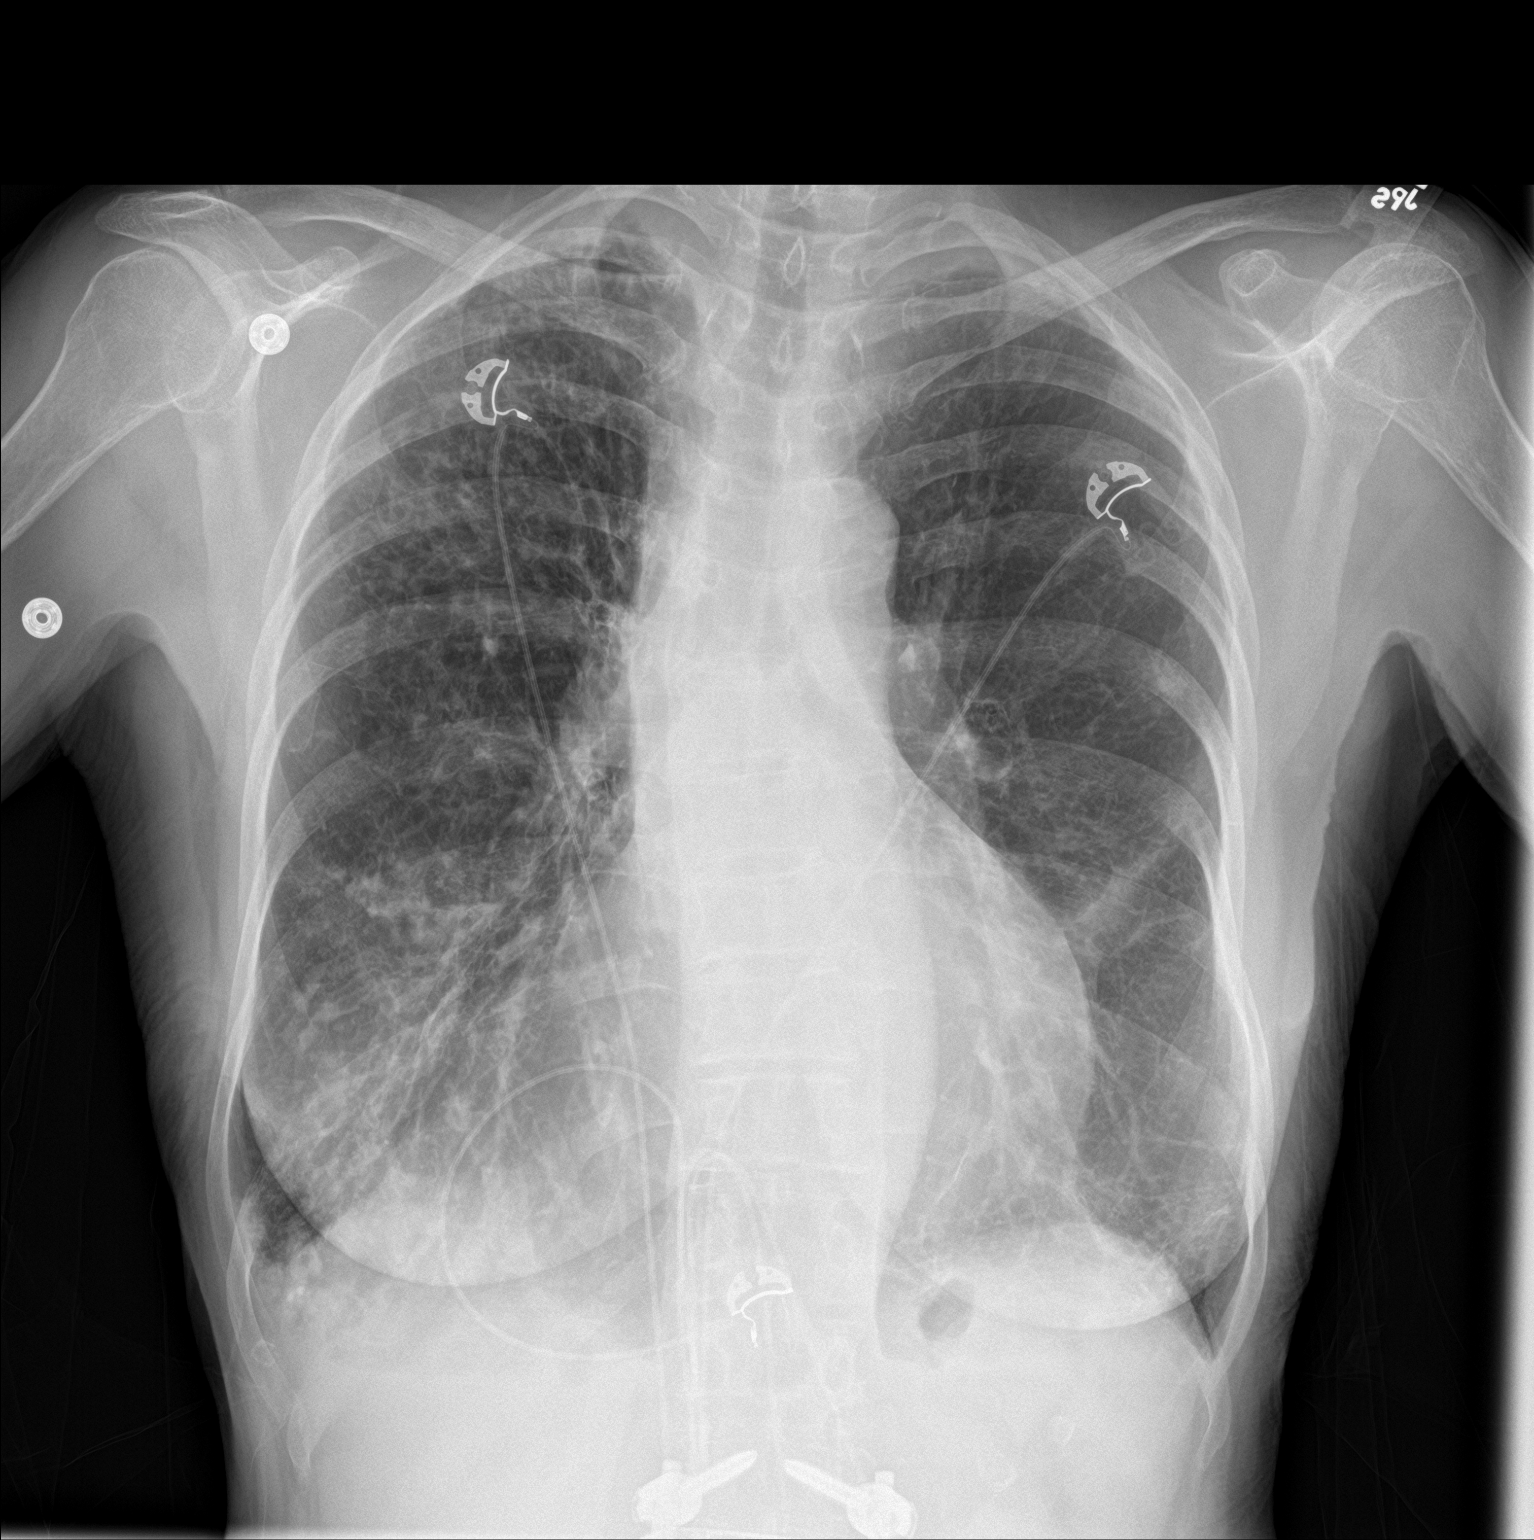

[chest lat]
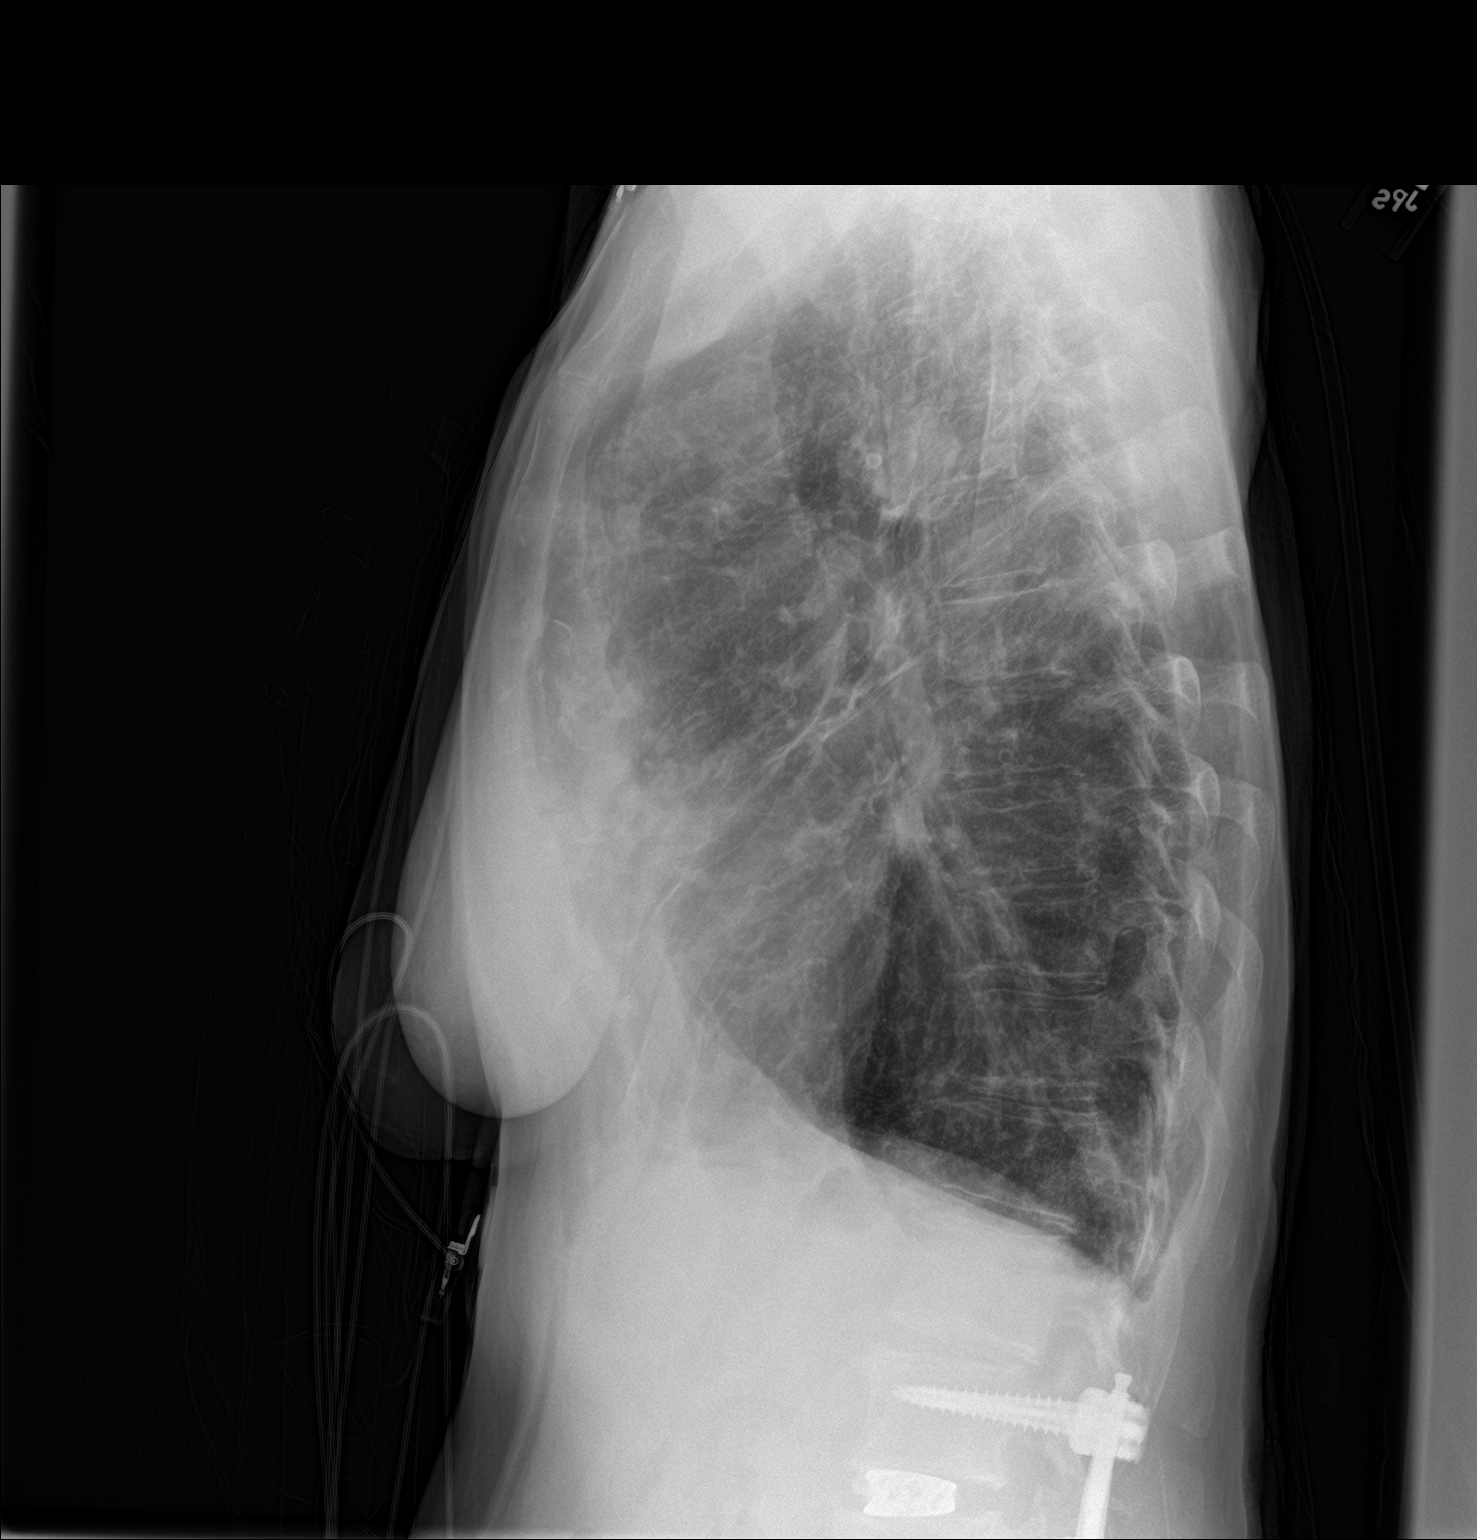

[2 of 2 positions shown; findings below may reference images not displayed]

FINDINGS: Normal heart size and mediastinal contours. Prominent
reticulonodular opacities in both upper and lower right lung are
consistent with CT findings of bronchiectasis, mucous plugging and
peripheral pulmonary nodularity. Superimposed acute pneumonia would
be difficult to exclude. There is some scarring in the left lung
with mild nodularity. No pneumothorax, pulmonary edema or
significant pleural fluid.
IMPRESSION: Prominent reticulonodular opacities in the upper and lower right
lung fields consistent with bronchiectasis, mucous plugging and
peripheral nodularity seen by CT. It would be difficult to exclude a
component of acute pneumonia.

## 2018-11-30 MED ORDER — SODIUM CHLORIDE 0.9% FLUSH
10.0000 mL | INTRAVENOUS | Status: DC | PRN
  Administered 2018-12-01: 10 mL
  Filled 2018-11-30: qty 40

## 2018-11-30 MED ORDER — SODIUM CHLORIDE 3 % IN NEBU
4.0000 mL | INHALATION_SOLUTION | Freq: Every day | RESPIRATORY_TRACT | Status: DC
  Administered 2018-11-30 – 2018-12-01 (×2): 4 mL via RESPIRATORY_TRACT
  Filled 2018-11-30 (×3): qty 4

## 2018-11-30 MED ORDER — SODIUM CHLORIDE 0.9 % IV SOLN: 2.0000 g | Freq: Two times a day (BID) | INTRAVENOUS | 0 refills | Status: DC

## 2018-11-30 MED ORDER — SENNA 8.6 MG PO TABS
2.0000 | ORAL_TABLET | Freq: Every day | ORAL | Status: DC | PRN
  Administered 2018-11-30 – 2018-12-01 (×2): 17.2 mg via ORAL
  Filled 2018-11-30 (×2): qty 2

## 2018-11-30 NOTE — Care Management Important Message (Signed)
Important Message  Patient Details  Name: Stacy Sanchez MRN: [id number] Date of Birth: 01-04-1946   Medicare Important Message Given:  Yes     Orbie Pyo 11/30/2018, 4:19 PM

## 2018-11-30 NOTE — Progress Notes (Signed)
Daughter [name] wants home health to be through [facility].  Caretaker usually with patient 8am-4pm.

## 2018-11-30 NOTE — Progress Notes (Signed)
PROGRESS NOTE  Stacy Sanchez ZTI:[id number] DOB: Jul 16, 1945 DOA: 11/27/2018 PCP: Patient, No Pcp Per  Brief History   The patient is a 73 yr old woman who carries a past medical history significant for MAC infection since 2001. She was diagnosed when she was living in [place], [place]. She was treated with Amikacin which caused her to have renal injury, and enthambutol which caused her to have peripheral neuropathy. She then moved to [facility]. She has been following with Infectious Disease [facility]) at [place]. She had been placed on inhaled gentamicin, but the patient stopped in in 05/2017.  The patient was recently admitted to [facility] 11/13/2018 - 11/22/2018 for surgery on her back. The patient tells me that prior to her being admitted [facility] that she had been diagnosed with thrush, although she had been on no steroids and no antibiotics at that time. In her post operative period the patient had severe respiratory decline with CO2 in the 70's and hypoxia. The patient tells me that after 3-4 days, pulmonology was consulted. Sputum cultures were obtained and showed moderate growth of pseudomonas and yeast on 11/21/2018. The patient was discharged to home on 11/22/2018 on a steroid taper. She was not discharged on antibiotics. This morning the patient developed severe right sided chest (rib) pain and severe shortness of breath. She was found to have SaO2 in the 70's by EMS. CTA of the chest was performed and demonstrated Right sided bronchiectasis and bronchial inflammation with a right upper lobe infiltrate.  She carries a past medical history significant for Scoliosis, Chronic MAC infection, Adverse Reactions to Ethambutol and Amikacin, CAD, hypothyroidism, CKD III, GAD, and Hypertension.  Infectious disease has been consulted. They have recommended reculture of sputum for AFB and standard sputum cultures. Dr. Baxter Flattery also recommended discontinuation of vancomycin, continuation of cefepime  and conversion of diflucan to oral. She has also recommended that the patient have a PICC placed and that the patient should continue Cefepime at home.   Pulmonology has also been consulted. REcommendatino has been for bronchial hygeine, mobilization, continuation of bronchodilator, and weaning off of steroids.  Consultants  . Infectious Disease . Pulmonology  Procedures  . None  Antibiotics   Anti-infectives (From admission, onward)   Start     Dose/Rate Route Frequency Ordered Stop   11/30/18 0000  ceFEPIme 2 g in sodium chloride 0.9 % 100 mL     2 g Intravenous Every 12 hours 11/30/18 1725 12/06/18 2359   11/28/18 2200  vancomycin (VANCOCIN) IVPB 1000 mg/200 mL premix  Status:  Discontinued     1,000 mg 200 mL/hr over 60 Minutes Intravenous Every 24 hours 11/27/18 1958 11/28/18 1725   11/28/18 1800  fluconazole (DIFLUCAN) tablet 200 mg     200 mg Oral Daily 11/28/18 1724     11/27/18 2200  ceFEPIme (MAXIPIME) 2 g in sodium chloride 0.9 % 100 mL IVPB     2 g 200 mL/hr over 30 Minutes Intravenous Every 12 hours 11/27/18 2001     11/27/18 2030  vancomycin (VANCOCIN) 1,500 mg in sodium chloride 0.9 % 500 mL IVPB     1,500 mg 250 mL/hr over 120 Minutes Intravenous  Once 11/27/18 1958 11/28/18 0018   11/27/18 2000  fluconazole (DIFLUCAN) IVPB 200 mg  Status:  Discontinued     200 mg 100 mL/hr over 60 Minutes Intravenous Every 24 hours 11/27/18 1929 11/28/18 0926      Subjective  The patient is without new complaints.  Objective   Vitals:  Vitals:   11/30/18 1333 11/30/18 1337  BP:    Pulse:    Resp:    Temp:    SpO2: 93% 93%    Exam:  Constitutional:  . The patient is awake, alert, and oriented x 3. No acute distress. Respiratory:  . Positive for mild dyspnea with conversation . Positive for diminished breath sounds bilaterally, small wheezes and rhonchi are auscultated. No rales.  . No tactile fremitus Cardiovascular:  . Regular rate and rhythm . No murmurs,  ectopy, or gallups. . No lateral PMI. No thrills. Abdomen:  . Abdomen is soft, non-tender, non-distended. . No hernias, masses, or organomegaly. . Normoactive bowel sounds.  Musculoskeletal:  . No cyanosis, clubbing, or edema Skin:  . No rashes, lesions, ulcers . palpation of skin: no induration or nodules Neurologic:  . CN 2-12 intact . Sensation all 4 extremities intact Psychiatric:  . Mental status o Mood, affect appropriate o Orientation to person, place, time  . judgment and insight appear intact   I have personally reviewed the following:   Today's Data  . Vitals, ABG, BMP, and CBC  Micro Data  . Pending .  Scheduled Meds: . amLODipine  5 mg Oral Daily  . aspirin EC  81 mg Oral Daily  . escitalopram  5 mg Oral Daily  . fluconazole  200 mg Oral Daily  . gabapentin  300 mg Oral BID  . guaiFENesin  1,200 mg Oral BID  . ipratropium-albuterol  3 mL Nebulization TID  . levothyroxine  50 mcg Oral QAC breakfast  . lidocaine  1 patch Transdermal Q24H  . multivitamin  15 mL Oral Daily  . nadolol  10 mg Oral BID  . polyethylene glycol  17 g Oral Daily  . rosuvastatin  10 mg Oral Daily  . sodium chloride HYPERTONIC  4 mL Nebulization Daily   Continuous Infusions: . ceFEPime (MAXIPIME) IV 2 g (11/30/18 5170)    Active Problems:   Acute respiratory failure with hypoxia (HCC)   Bronchiectasis with acute exacerbation (HCC)   Pseudomonas aeruginosa infection   Thrush   CAD (coronary artery disease)   CKD (chronic kidney disease), stage III (HCC)   Essential hypertension   Generalized anxiety disorder   Peripheral neuropathy   Mycobacterium avium complex ([place])   LOS: 3 days   A & P  Acute Respiratory Failure: The patient does not ordinarily require oxygen, although she does get short of breath while walking her dog. Today she is requiring 3L to maintain SaO2 of 94%. She had retention of CO2 at [facility], but that was felt to be due to narcotic pain medication. Will  get ABG for any decrease in level of consciousness. Pulmonology has also been consulted. REcommendatino has been for bronchial hygeine, mobilization, continuation of bronchodilator, and weaning off of steroids.  MAC infection: The patient has been under the care of Brigitte Pulse at [place], but has not seem him for over a year. She states that the last medication she received was inhaled gentamicin, but that she stopped it because she didn't think she needed it. Dr. Clovis Riley felt that her bronchietcasis was stable at the time. She now appears to be in exacerbation with her bronchiectasis. While at [facility] the patient was placed on IV solumedrol. This was converted to a prednisone taper when she was sent home 5 days ago. She is now taking 30 mg daily. Although this clearly can complicate and contribute to an exacerbation of her MAC,  I fear that stopping it abruptly could place the patient in an adrenal crisis. I will continue her taper, but abrogate it. Infectious disease has been consulted. They have recommended reculture of sputum for AFB and standard sputum cultures. Dr. Baxter Flattery also recommended discontinuation of vancomycin, continuation of cefepime and conversion of diflucan to oral. Will repeat sputums as it seems that they were not sufficient for cultures. I have ordered PICC placement and ordered antibiotics for home administration.  HCAP: The patient will be treated with cefepime to cover for pseudomonas which grew out of a recent (11/21/2018) sputum at [facility] She will also be placed on Vancomycin has been discontinued at the recommendation of ID.She has been continued on oral diflucan for the growth of yeast for her sputum. Mobilize patient.  Thrush: Pt also grew yeast from her sputum on 11/21/2018 at [place]. I will add diflucan. Patient states that she is much improved.  CAD: Continue Aspiring, Nadolol (with parameters), and Crestor. She will be admitted to a telemetry bed.  CKD III:  Due to Amikacin. Patient's GFR is likely at baseline at 56. I will monitor GFR, Creatinine, electrolytes, and volume status.  Generalized anxiety disorder/Depression: As needed Ativan and continue home meds for depression.  Peripheral Neuropathy: Due to Ethambutol. Continue neurontin.  I have seen and examined this patient myself. I have spent 32 minutes in her evaluation and care.  DVT prophylaxis: SCD's Code Status: Full Code Family Communication: Daughter, [name] at bedside. Disposition Plan: Home  Jadira Nierman, DO [facility] Direct contact: see [email or web address]  7PM-7AM contact night coverage as above 11/30/2018, 6:37 PM  LOS: 1 day

## 2018-11-30 NOTE — Progress Notes (Signed)
Peripherally Inserted Central Catheter/Midline Placement  The IV Nurse has discussed with the patient and/or persons authorized to consent for the patient, the purpose of this procedure and the potential benefits and risks involved with this procedure.  The benefits include less needle sticks, lab draws from the catheter, and the patient may be discharged home with the catheter. Risks include, but not limited to, infection, bleeding, blood clot (thrombus formation), and puncture of an artery; nerve damage and irregular heartbeat and possibility to perform a PICC exchange if needed/ordered by physician.  Alternatives to this procedure were also discussed.  Bard Power PICC patient education guide, fact sheet on infection prevention and patient information card has been provided to patient /or left at bedside.    PICC/Midline Placement Documentation        Darlyn Read 11/30/2018, 4:06 PM

## 2018-11-30 NOTE — Progress Notes (Signed)
Pharmacy Antibiotic Note  Stacy Sanchez is a 73 y.o. female admitted on 11/27/2018 with pneumonia. Pt has chronic MAC infection, completed prednisone taper on 8/10. Recently admitted to [facility] where sputum cx (11/18/18) grew Pseudomonas and yeast, not discharged on abx. Pharmacy spoke to [facility] and confirmed that Pseudomonas from sputum cx was sensitive to cefepime. MD ordered fluconazole for thrush. CTA chest showed right upper lobe infiltrate. Afebrile, Tmax 98.5. WBC elevated 13.9 in the setting of steroid use. Scr 0.93, baseline stage III CKD.  Plan: Continue Cefepime 2 gm IV q12h  F/u renal function, clinical improvement, 8/10 sputum cx  Height: _0  (170.2 cm) Weight: 153 lb 10.6 oz (69.7 kg) IBW/kg (Calculated) : 61.6  Temp (24hrs), Avg:98.2 F (36.8 C), Min:97.9 F (36.6 C), Max:98.5 F (36.9 C)  Recent Labs  Lab 11/27/18 1253 11/28/18 0208 11/29/18 0541 11/30/18 0549  WBC 20.2* 17.8* 15.3* 13.9*  CREATININE 0.98 0.93 0.80 0.93    Estimated Creatinine Clearance: 50.8 mL/min (by C-G formula based on SCr of 0.93 mg/dL).    Allergies  Allergen Reactions  . Amikacin Other (See Comments)    nephropathy   . Ethambutol Other (See Comments)    neuropathy  . Levaquin [Levofloxacin] Other (See Comments)    Fainting and weakness  . Parafon Forte Dsc [Chlorzoxazone] Hives  . Rifabutin Diarrhea  . Tequin [Gatifloxacin] Other (See Comments)    Fainting and weakness  . Zyvox [Linezolid] Diarrhea    Antimicrobials this admission: 8/7 vanc >> 8/8 8/7 cefepime >> 8/7 fluconazole >>  Microbiology results: 8/10 Sputum Cx: pending 8/10 resp. Cx: MODERATE WBC PRESENT, PREDOMINANTLY PMN  RARE SQUAMOUS EPITHELIAL CELLS PRESENT  FEW GRAM POSITIVE COCCI IN PAIRS IN CHAINS 8/8 Bcx: NG <24hr; NG x2d 8/8 AFB: sent 8/8 Sputum Cx: not acceptable for testing, need repeat 8/7 Covid: negative 7/29 Sputum cx [facility]): mod growth Pseudomonas  sensitive to cefepime and Zosyn, and yeast   8/10 CXR: Prominent reticulonodular opacities in the upper and lower right lung fields consistent with bronchiectasis, mucous plugging and peripheral nodularity seen by CT. It would be difficult to exclude a component of acute pneumonia  Thank you for allowing pharmacy to be a part of this patient's care.  Gwenlyn Fudge Student-PharmD

## 2018-11-30 NOTE — Progress Notes (Signed)
[place] [facility]  Chief Complaint  Patient presents with  . Shortness of Breath    Constitutional:  BP (!) 146/83 (BP Location: Left Arm) Comment: will notify the nurse  Pulse (!) 57 Comment: will notify the nurse  Temp 98.5 F (36.9 C) (Oral)   Resp 18   Ht 5\' 7"  (1.702 m)   Wt 69.7 kg   SpO2 97%   BMI 24.07 kg/m   Past Medical History:  Hypothyroidism, HTN, OA, Renal failure from amikacin, Shingles, Neuropathy from ethambutol, Osteopenia, Anxiety, Cataracts  Brief Summary:  Stacy Sanchez is a 73 y.o. female dx with MAI in 6720 complicated by bronchiectasis.  Followed by ID in [facility].  Still has cough, but not much sputum coming out. Chest pain better.     Physical Exam:   General - alert Eyes - pupils reactive ENT - no sinus tenderness, no stridor Cardiac - regular rate/rhythm, no murmur Chest - equal breath sounds b/l, faint inspiratory squeaks, no wheeze/rales Abdomen - soft, non tender, + bowel sounds Extremities - no cyanosis, clubbing, or edema Skin - no rashes Neuro - normal strength, moves extremities, follows commands Psych - normal mood and behavior  CXR (reviewed by me) - changes of BTX with faint RUL infiltrate  Assessment/Plan:   Acute hypoxic respiratory failure from exacerbation of bronchiectasis with Rt upper lobe pneumonia with hx of MAI. - assess for home oxygen - will need to arrange for home nebulizer and home hypertonic saline to use daily, and prn BDs - continue mucinex, flutter vavle - ID arranging for PICC line to complete course of cefepime - f/u sputum culture and sputum AFB  She would like to arrange for outpt pulmonary and infectious disease follow up in [place].  I have scheduled her for pulmonary follow up with Stacy Sanchez on Monday August 17 at 10:30 pm.  She will need a chest xray at time of follow up.   Stacy Mires, MD [place] [facility] Pager: [phone] 11/30/2018,  11:30 AM  Flow Sheet     Pulmonary tests:    Chest imaging:  CT angio chest 11/27/18 >> prominent adenopathy, mosaic attenuation, BTX, central airway thickening, mucus plugging, mild emphysema, density in RUL  Medications:     Past Surgical History:  She  has a past surgical history that includes Cardiac surgery; Abdominal hysterectomy; Tonsillectomy (1956); Bronchoscopy (2001); Colonoscopy (2008); Cyst removal trunk (1965); Dilation and curettage of uterus (1975); Hand surgery (Right, 1983); Patent ductus arterious repair (1952); and Varicose vein surgery (1972).   Family History:  Her family history includes Thyroid disease in her daughter.  Social History:  She  reports that she has never smoked. She has never used smokeless tobacco. She reports that she does not drink alcohol or use drugs.

## 2018-11-30 NOTE — Plan of Care (Signed)
  Problem: Clinical Measurements: Goal: Ability to maintain clinical measurements within normal limits will improve Outcome: Progressing   Problem: Clinical Measurements: Goal: Cardiovascular complication will be avoided Outcome: Progressing   

## 2018-12-01 LAB — CBC WITH DIFFERENTIAL/PLATELET
Abs Immature Granulocytes: 0.07 10*3/uL (ref 0.00–0.07)
Basophils Absolute: 0.1 10*3/uL (ref 0.0–0.1)
Basophils Relative: 1 %
Eosinophils Absolute: 0.3 10*3/uL (ref 0.0–0.5)
Eosinophils Relative: 2 %
HCT: 38.2 % (ref 36.0–46.0)
Hemoglobin: 12.1 g/dL (ref 12.0–15.0)
Immature Granulocytes: 1 %
Lymphocytes Relative: 15 %
Lymphs Abs: 2.2 10*3/uL (ref 0.7–4.0)
MCH: 30.8 pg (ref 26.0–34.0)
MCHC: 31.7 g/dL (ref 30.0–36.0)
MCV: 97.2 fL (ref 80.0–100.0)
Monocytes Absolute: 1.1 10*3/uL — ABNORMAL HIGH (ref 0.1–1.0)
Monocytes Relative: 8 %
Neutro Abs: 10.6 10*3/uL — ABNORMAL HIGH (ref 1.7–7.7)
Neutrophils Relative %: 73 %
Platelets: 243 10*3/uL (ref 150–400)
RBC: 3.93 MIL/uL (ref 3.87–5.11)
RDW: 14.9 % (ref 11.5–15.5)
WBC: 14.5 10*3/uL — ABNORMAL HIGH (ref 4.0–10.5)
nRBC: 0 % (ref 0.0–0.2)

## 2018-12-01 LAB — BASIC METABOLIC PANEL
Anion gap: 12 (ref 5–15)
BUN: 23 mg/dL (ref 8–23)
CO2: 30 mmol/L (ref 22–32)
Calcium: 8.6 mg/dL — ABNORMAL LOW (ref 8.9–10.3)
Chloride: 95 mmol/L — ABNORMAL LOW (ref 98–111)
Creatinine, Ser: 0.95 mg/dL (ref 0.44–1.00)
GFR calc Af Amer: >60 mL/min (ref >60)
GFR calc non Af Amer: 59 mL/min — ABNORMAL LOW (ref >60)
Glucose, Bld: 96 mg/dL (ref 70–99)
Potassium: 4.2 mmol/L (ref 3.5–5.1)
Sodium: 137 mmol/L (ref 135–145)

## 2018-12-01 LAB — ACID FAST SMEAR (AFB, MYCOBACTERIA): Acid Fast Smear: NEGATIVE

## 2018-12-01 MED ORDER — GUAIFENESIN ER 600 MG PO TB12: 1200.0000 mg | ORAL_TABLET | Freq: Two times a day (BID) | ORAL | 0 refills | Status: DC

## 2018-12-01 MED ORDER — SODIUM CHLORIDE 3 % IN NEBU: 4.0000 mL | INHALATION_SOLUTION | Freq: Every day | RESPIRATORY_TRACT | 12 refills | Status: DC

## 2018-12-01 MED ORDER — COMPRESSOR/NEBULIZER MISC: 1.0000 | Freq: Once | 0 refills | Status: AC

## 2018-12-01 MED ORDER — FLUCONAZOLE 200 MG PO TABS: 200.0000 mg | ORAL_TABLET | Freq: Every day | ORAL | 0 refills | Status: AC

## 2018-12-01 MED ORDER — IPRATROPIUM-ALBUTEROL 0.5-2.5 (3) MG/3ML IN SOLN: 3.0000 mL | Freq: Three times a day (TID) | RESPIRATORY_TRACT | 0 refills | Status: DC

## 2018-12-01 MED ORDER — CEFEPIME IV (FOR PTA / DISCHARGE USE ONLY): 2.0000 g | Freq: Two times a day (BID) | INTRAVENOUS | 0 refills | Status: AC

## 2018-12-01 MED ORDER — HEPARIN SOD (PORK) LOCK FLUSH 100 UNIT/ML IV SOLN
250.0000 [IU] | INTRAVENOUS | Status: AC | PRN
  Administered 2018-12-01: 250 [IU]

## 2018-12-01 NOTE — Progress Notes (Signed)
Pt A&OX3. While sleeping pt's desats at least once to 85% on RA and frequently to 86-88% on RA. Pt was placed on oxygen 1 L/[place].

## 2018-12-01 NOTE — Progress Notes (Signed)
PHARMACY CONSULT NOTE FOR:  OUTPATIENT  PARENTERAL ANTIBIOTIC THERAPY (OPAT)  Indication: Pseudomonas bronchiectasis Regimen: Cefepime 2 gm Q 12 hours End date: 12/06/2018  IV antibiotic discharge orders are pended. To discharging provider:  please sign these orders via discharge navigator,  Select New Orders & click on the button choice - Manage This Unsigned Work.     Thank you for allowing pharmacy to be a part of this patient's care.  Jimmy Footman, PharmD, BCPS, BCIDP Infectious Diseases Clinical Pharmacist Phone: [phone] 12/01/2018, 1:26 PM

## 2018-12-01 NOTE — Discharge Summary (Signed)
Physician Discharge Summary  Stacy Sanchez WVP:[id number] DOB: 1946/02/11 DOA: 11/27/2018  PCP: Burnett Sheng, MD  Admit date: 11/27/2018 Discharge date: 12/01/2018  Recommendations for Outpatient Follow-up:  1. The patient will follow up with Infectious Disease in 10 days. 2. She will continue IV Cefepime per PICC line for another 6 days as per ID's instructions. 3. She will follow up with her PCP in 7-10 days.  4. She will follow up with pulmonology as directed. 5. She will go home with O2 and a nebulizer.  Follow-up Information    Parrett, Fonnie Mu, NP Follow up on 12/07/2018.   Specialty: Pulmonary Disease Why: Follow up with lung doctors at 10:30 AM.  You will need a chest xray at the time of your visit. Contact information: [address] [place] [address] [phone]        Carlyle Basques, MD Follow up on 12/07/2018.   Specialty: Infectious Diseases Why: 10:00 a.m. appointment. Please arrive 15 minutes early to register.  Contact information: [place] [address] [phone]          Discharge Diagnoses: Principal diagnosis is #1 1. Acute respiratory failure 2. MAC infection 3. Exacerbation of bronchiectasis 4. Pseudomonal infection 5. Thrush 6. CAD 7. CKD III 8. Generalized anxiety 9. Peripheral Neuropathy  Discharge Condition: Fair Disposition: Home  Diet recommendation: Heart Healthy  Filed Weights   11/27/18 1900  Weight: 69.7 kg    History of present illness:  The patient is a 73 yr old woman who carries a past medical history significant for MAC infection since 2001. She was diagnosed when she was living in [place], [place]. She was treated with Amikacin which caused her to have renal injury, and enthambutol which caused her to have peripheral neuropathy. She then moved to [facility]. She has been following with Infectious Disease [facility]) at [place]. She had been placed on inhaled gentamicin, but the  patient stopped in in 05/2017.  The patient was recently admitted to [facility] 11/13/2018 - 11/22/2018 for surgery on her back. The patient tells me that prior to her being admitted to [facility] that she had been diagnosed with thrush, although she had been on no steroids and no antibiotics at that time. In her post operative period the patient had severe respiratory decline with CO2 in the 70's and hypoxia. The patient tells me that after 3-4 days, pulmonology was consulted. Sputum cultures were obtained and showed moderate growth of pseudomonas and yeast on 11/21/2018. The patient was discharged to home on 11/22/2018 on a steroid taper. She was not discharged on antibiotics. This morning the patient developed severe right sided chest (rib) pain and severe shortness of breath. She was found to have SaO2 in the 70's by EMS. CTA of the chest was performed and demonstrated Right sided bronchiectasis and bronchial inflammation with a right upper lobe infiltrate.  She carries a past medical history significant for Scoliosis, Chronic MAC infection, Adverse Reactions to Ethambutol and Amikacin, CAD, hypothyroidism, CKD III, GAD, and Hypertension.  Hospital Course:  The patient is a 73 yr old woman who carries a past medical history significant for MAC infection since 2001. She was diagnosed when she was living in [place], [place]. She was treated with Amikacin which caused her to have renal injury, and enthambutol which caused her to have peripheral neuropathy. She then moved to [facility]. She has been following with Infectious Disease [facility]) at [place]. She had been placed  on inhaled gentamicin, but the patient stopped in in 05/2017.  The patient was recently admitted to [facility] 11/13/2018 - 11/22/2018 for surgery on her back. The patient tells me that prior to her being admitted to [place] that she had been diagnosed with thrush, although she had been on no steroids and no antibiotics at  that time. In her post operative period the patient had severe respiratory decline with CO2 in the 70's and hypoxia. The patient tells me that after 3-4 days, pulmonology was consulted. Sputum cultures were obtained and showed moderate growth of pseudomonas and yeast on 11/21/2018. The patient was discharged to home on 11/22/2018 on a steroid taper. She was not discharged on antibiotics. This morning the patient developed severe right sided chest (rib) pain and severe shortness of breath. She was found to have SaO2 in the 70's by EMS. CTA of the chest was performed and demonstrated Right sided bronchiectasis and bronchial inflammation with a right upper lobe infiltrate.  She carries a past medical history significant for Scoliosis, Chronic MAC infection, Adverse Reactions to Ethambutol and Amikacin, CAD, hypothyroidism, CKD III, GAD, and Hypertension.  Infectious disease has been consulted. They have recommended reculture of sputum for AFB and standard sputum cultures. Dr. Baxter Flattery also recommended discontinuation of vancomycin, continuation of cefepime and conversion of diflucan to oral. She has also recommended that the patient have a PICC placed and that the patient should continue Cefepime at home.   Pulmonology has also been consulted. REcommendatino has been for bronchial hygeine, mobilization, continuation of bronchodilator, and weaning off of steroids.  Today's assessment: S: The patient is resting comfortably. No new complaints. O: Vitals:  Vitals:   12/01/18 0840 12/01/18 1312  BP:    Pulse:    Resp:    Temp:    SpO2: 94% 93%    Constitutional:   The patient is awake, alert, and oriented x 3. No acute distress. Respiratory:   Positive for mild dyspnea with conversation  Positive for diminished breath sounds bilaterally, small wheezes and rhonchi are auscultated. No rales.   No tactile fremitus Cardiovascular:   Regular rate and rhythm  No murmurs, ectopy, or gallups.  No  lateral PMI. No thrills. Abdomen:   Abdomen is soft, non-tender, non-distended.  No hernias, masses, or organomegaly.  Normoactive bowel sounds.  Musculoskeletal:   No cyanosis, clubbing, or edema Skin:   No rashes, lesions, ulcers  palpation of skin: no induration or nodules Neurologic:   CN 2-12 intact  Sensation all 4 extremities intact Psychiatric:   Mental status ? Mood, affect appropriate ? Orientation to person, place, time   judgment and insight appear intact    Discharge Instructions  Discharge Instructions    Activity as tolerated - No restrictions   Complete by: As directed    Activity as tolerated - No restrictions   Complete by: As directed    Call MD for:  difficulty breathing, headache or visual disturbances   Complete by: As directed    Call MD for:  persistant nausea and vomiting   Complete by: As directed    Call MD for:  severe uncontrolled pain   Complete by: As directed    Call MD for:  temperature >100.4   Complete by: As directed    Diet - low sodium heart healthy   Complete by: As directed    Discharge instructions   Complete by: As directed    Follow up with infectious disease in 10 days. Follow up  with PCP in 7-10 days.   For home use only DME oxygen   Complete by: As directed    Length of Need: Lifetime   Mode or (Route): Nasal cannula   Liters per Minute: 2   Oxygen conserving device: Yes   Oxygen delivery system: Gas   Home infusion instructions [facility] May follow [place] Dosing Protocol; May administer Cathflo as needed to maintain patency of vascular access device.; Flushing of vascular access device: per [facility] Protocol: 0.9% NaCl pre/post medica...   Complete by: As directed    Instructions: May follow [place] Dosing Protocol   Instructions: May administer Cathflo as needed to maintain patency of vascular access device.   Instructions: Flushing of vascular access device: per [facility] Protocol: 0.9% NaCl  pre/post medication administration and prn patency; Heparin 100 u/ml, 46m for implanted ports and Heparin 10u/ml, 566mfor all other central venous catheters.   Instructions: May follow [facility] Anaphylaxis Protocol for First Dose Administration in the home: 0.9% NaCl at 25-50 ml/hr to maintain IV access for protocol meds. Epinephrine 0.3 ml IV/IM PRN and Benadryl 25-50 IV/IM PRN s/s of anaphylaxis.   Instructions: [facility] Coordinator (RN) to assist per patient IV care needs in the home PRN.   Increase activity slowly   Complete by: As directed    Increase activity slowly   Complete by: As directed      Allergies as of 12/01/2018      Reactions   Amikacin Other (See Comments)   nephropathy    Ethambutol Other (See Comments)   neuropathy   Levaquin [levofloxacin] Other (See Comments)   Fainting and weakness   Parafon Forte Dsc [chlorzoxazone] Hives   Rifabutin Diarrhea   Tequin [gatifloxacin] Other (See Comments)   Fainting and weakness   Zyvox [linezolid] Diarrhea      Medication List    STOP taking these medications   predniSONE 10 MG tablet Commonly known as: DELTASONE   traMADol 50 MG tablet Commonly known as: ULTRAM     TAKE these medications   acetaminophen 650 MG CR tablet Commonly known as: TYLENOL Take 650 mg by mouth every 8 (eight) hours as needed for pain.   amLODipine 5 MG tablet Commonly known as: NORVASC Take 5 mg by mouth daily.   aspirin EC 81 MG tablet Take 81 mg by mouth daily.   ceFEPime  IVPB Commonly known as: MAXIPIME Inject 2 g into the vein every 12 (twelve) hours for 5 days. Indication:  Pseudomonas Bronchiectasis Last Day of Therapy:  12/06/18 Labs - Once weekly:  CBC/D and BMP, Labs - Every other week:  ESR and CRP Start taking on: December 02, 2018 Notes to patient: 12/01/18 at 8 pm   Compressor/Nebulizer Misc 1 Product by Does not apply route once for 1 dose.   escitalopram 5 MG tablet Commonly known as: LEXAPRO Take 5  mg by mouth daily.   fluconazole 200 MG tablet Commonly known as: DIFLUCAN Take 1 tablet (200 mg total) by mouth daily for 6 days. Start taking on: December 02, 2018   gabapentin 300 MG capsule Commonly known as: NEURONTIN Take 300 mg by mouth 2 (two) times daily.   guaiFENesin 600 MG 12 hr tablet Commonly known as: MUCINEX Take 2 tablets (1,200 mg total) by mouth 2 (two) times daily.   HYDROcodone-acetaminophen 5-325 MG tablet Commonly known as: NORCO/VICODIN Take 0.5 tablets by mouth every 6 (six) hours as needed for moderate pain.   ipratropium-albuterol  0.5-2.5 (3) MG/3ML Soln Commonly known as: DUONEB Take 3 mLs by nebulization 3 (three) times daily.   levothyroxine 50 MCG tablet Commonly known as: SYNTHROID Take 50 mcg by mouth daily before breakfast. Take 1 tablet by mouth daily except for Saturday and _0 /11/20 1336   11/30/18 1522  For home use only DME Nebulizer machine  Once    Question Answer Comment  Patient needs a nebulizer to treat with the following condition Acute respiratory failure (HCC)     Length of Need 12 Months      11/30/18 1522         Allergies  Allergen Reactions   Amikacin Other (See Comments)    nephropathy    Ethambutol Other (See Comments)    neuropathy   Levaquin [Levofloxacin] Other (See Comments)    Fainting and weakness   Parafon Forte Dsc [Chlorzoxazone] Hives   Rifabutin Diarrhea   Tequin [Gatifloxacin] Other (See Comments)    Fainting and weakness   Zyvox [Linezolid] Diarrhea    The results of significant diagnostics from this hospitalization (including imaging, microbiology, ancillary and laboratory) are listed below for reference.    Significant Diagnostic Studies: Dg Chest 2 View  Result Date: 11/30/2018 CLINICAL DATA:  Bronchiectasis, pulmonary MAC, shortness of breath and hypoxia. EXAM: CHEST - 2 VIEW COMPARISON:  CTA of the chest on 11/27/2018 FINDINGS: Normal heart size and mediastinal contours. Prominent reticulonodular opacities in both upper and lower right lung are consistent with CT findings of bronchiectasis, mucous plugging and peripheral pulmonary nodularity. Superimposed acute pneumonia would be difficult to exclude. There is some scarring in the left lung with mild nodularity. No pneumothorax, pulmonary edema or significant pleural fluid. IMPRESSION: Prominent reticulonodular opacities in the upper and lower right lung fields consistent with bronchiectasis, mucous plugging and peripheral nodularity seen by CT. It would be difficult to exclude a component of acute pneumonia. Electronically Signed   By: Aletta Edouard M.D.   On: 11/30/2018 08:24   Ct Angio Chest Pe W And/or Wo Contrast  Result Date: 11/27/2018 CLINICAL DATA:  Shortness of breath. History of MAC lung infection with recent back surgery 2 weeks ago. Right chest pain and shortness of breath. Concern for pulmonary embolism. EXAM: CT ANGIOGRAPHY CHEST WITH CONTRAST TECHNIQUE: Multidetector CT imaging of the chest was performed using the standard protocol during bolus  administration of intravenous contrast. Multiplanar CT image reconstructions and MIPs were obtained to evaluate the vascular anatomy. CONTRAST:  115m OMNIPAQUE IOHEXOL 350 MG/ML SOLN COMPARISON:  None. FINDINGS: Cardiovascular: The pulmonary arteries are well opacified with contrast to the level of the subsegmental branches. There is no evidence of acute pulmonary embolism. Minimal atherosclerosis of the aorta and great vessels. No acute vascular findings are seen. The heart size is normal. There is no pericardial effusion. Mediastinum/Nodes: There is a prominent right hilar node measuring 13 mm on image 63/5. There is a subcarinal node measuring 15 mm on image 77/5. No other enlarged mediastinal, hilar or axillary lymph nodes are demonstrated. The thyroid gland, trachea and esophagus demonstrate no significant findings. Lungs/Pleura: There is no pleural effusion or pneumothorax. There is extensive chronic lung disease with mosaic attenuation, bronchiectasis, central airway thickening and mucous plugging. The bronchiectasis is greatest in the right middle lobe, lingula and right lower lobe. There are scattered peribronchial inflammatory changes, greatest in the right upper lobe. No consolidation or suspicious pulmonary nodule identified. There is mild underlying emphysema. Upper abdomen: No significant findings are seen within the visualized upper abdomen. 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 Musculoskeletal/Chest wall: There is no chest wall mass or suspicious osseous finding. Review of the MIP images confirms the above findings. IMPRESSION: 1. No evidence of acute pulmonary embolism or other acute vascular process. 2. Extensive chronic lung disease compatible with the given history  of atypical mycobacterial infection. Without comparison studies, it is difficult to exclude acute superimposed inflammation, especially in the right upper lobe. At least short term chest radiographic follow up recommended. CT  follow-up in 3-6 months should be considered. 3. Mildly prominent right hilar and subcarinal lymph nodes, likely reactive. Electronically Signed   By: Richardean Sale M.D.   On: 11/27/2018 16:00   [place] Ekg Site Rite  Result Date: 11/30/2018 If Site Rite image not attached, placement could not be confirmed due to current cardiac rhythm.   Microbiology: Recent Results (from the past 240 hour(s))  SARS Coronavirus 2 [facility] order, Performed in [facility] hospital lab) Nasopharyngeal Nasopharyngeal Swab     Status: None   Collection Time: 11/27/18  7:10 PM   Specimen: Nasopharyngeal Swab  Result Value Ref Range Status   SARS Coronavirus 2 NEGATIVE NEGATIVE Final    Comment: (NOTE) If result is NEGATIVE SARS-CoV-2 target nucleic acids are NOT DETECTED. The SARS-CoV-2 RNA is generally detectable in upper and lower  respiratory specimens during the acute phase of infection. The lowest  concentration of SARS-CoV-2 viral copies this assay can detect is 250  copies / mL. A negative result does not preclude SARS-CoV-2 infection  and should not be used as the sole basis for treatment or other  patient management decisions.  A negative result may occur with  improper specimen collection / handling, submission of specimen other  than nasopharyngeal swab, presence of viral mutation(s) within the  areas targeted by this assay, and inadequate number of viral copies  (<250 copies / mL). A negative result must be combined with clinical  observations, patient history, and epidemiological information. If result is POSITIVE SARS-CoV-2 target nucleic acids are DETECTED. The SARS-CoV-2 RNA is generally detectable in upper and lower  respiratory specimens dur ing the acute phase of infection.  Positive  results are indicative of active infection with SARS-CoV-2.  Clinical  correlation with patient history and other diagnostic information is  necessary to determine patient infection status.  Positive  results do  not rule out bacterial infection or co-infection with other viruses. If result is PRESUMPTIVE POSTIVE SARS-CoV-2 nucleic acids MAY BE PRESENT.   A presumptive positive result was obtained on the submitted specimen  and confirmed on repeat testing.  While 2019 novel coronavirus  (SARS-CoV-2) nucleic acids may be present in the submitted sample  additional confirmatory testing may be necessary for epidemiological  and / or clinical management purposes  to differentiate between  SARS-CoV-2 and other Sarbecovirus currently known to infect humans.  If clinically indicated additional testing with an alternate test  methodology [phone]) is advised. The SARS-CoV-2 RNA is generally  detectable in upper and lower respiratory sp ecimens during the acute  phase of infection. The expected result is Negative. Fact Sheet for Patients:  StrictlyIdeas.no Fact Sheet for Healthcare Providers: BankingDealers.co.za This test is not yet approved or cleared by the [place] FDA and has been authorized for detection and/or diagnosis of SARS-CoV-2 by FDA under an Emergency Use Authorization (EUA).  This EUA will remain in effect (meaning this test can be used) for the duration of the COVID-19 declaration under Section 564(b)(1) of the Act, 21 U.S.C. section 360bbb-3(b)(1), unless the authorization is terminated or revoked sooner. Performed at [facility], [place] [address] [place], [place] [address]   Culture, blood (routine x 2)     Status: None (Preliminary result)   Collection Time: 11/28/18 11:24 AM   Specimen: BLOOD RIGHT HAND  Result Value Ref Range Status   Specimen Description BLOOD RIGHT HAND  Final   Special Requests   Final    BOTTLES DRAWN AEROBIC ONLY Blood Culture adequate volume   Culture   Final    NO GROWTH 3 DAYS Performed at [facility], [address] [place], [place] [address]    Report Status PENDING   Incomplete  Culture, blood (routine x 2)     Status: None (Preliminary result)   Collection Time: 11/28/18 11:33 AM   Specimen: BLOOD LEFT HAND  Result Value Ref Range Status   Specimen Description BLOOD LEFT HAND  Final   Special Requests   Final    BOTTLES DRAWN AEROBIC ONLY Blood Culture adequate volume   Culture   Final    NO GROWTH 3 DAYS Performed at [facility], [place] [address] [place], [place] [address]    Report Status PENDING  Incomplete  Expectorated sputum assessment w rflx to resp cult     Status: None   Collection Time: 11/28/18  5:28 PM   Specimen: Expectorated Sputum  Result Value Ref Range Status   Specimen Description EXPECTORATED SPUTUM  Final   Special Requests NONE  Final   Sputum evaluation   Final    Sputum specimen not acceptable for testing.  Please recollect.   Gram Stain Report Called to,Read Back By and Verified With: RN D COX [id number] Performed at [facility], [place] [address], [place], [place] [address]    Report Status 11/28/2018 FINAL  Final  Expectorated sputum assessment w rflx to resp cult     Status: None   Collection Time: 11/30/18  8:02 AM   Specimen: Expectorated Sputum  Result Value Ref Range Status   Specimen Description EXPECTORATED SPUTUM  Final   Special Requests NONE  Final   Sputum evaluation   Final    THIS SPECIMEN IS ACCEPTABLE FOR SPUTUM CULTURE Performed at [facility], [place] [address], [place], [place] [address]    Report Status 11/30/2018 FINAL  Final  Culture, respiratory     Status: None (Preliminary result)   Collection Time: 11/30/18  8:02 AM  Result Value Ref Range Status   Specimen Description EXPECTORATED SPUTUM  Final   Special Requests NONE Reflexed from [id number]  Final   Gram Stain   Final    MODERATE WBC PRESENT, PREDOMINANTLY PMN RARE SQUAMOUS EPITHELIAL CELLS PRESENT FEW GRAM POSITIVE COCCI IN PAIRS IN CHAINS    Culture   Final    FEW PSEUDOMONAS AERUGINOSA SUSCEPTIBILITIES TO  FOLLOW Performed at [facility], [place] [address] [place], [place] [address]    Report Status PENDING  Incomplete     Labs: Basic Metabolic Panel: Recent Labs  Lab 11/27/18 1253 11/28/18 0208 11/29/18 0541 11/30/18 0549 12/01/18 0507  NA 138 135 138 138 137  K 4.1 4.3 4.2 4.5 4.2  CL 98 96* 103 98 95*  CO2 _0 GLUCOSE 101* 81 86 86 96  BUN 24* 28* _1 CREATININE 0.98 0.93 0.80 0.93 0.95  CALCIUM 8.9 8.5* 8.2* 8.2* 8.6*   Liver Function Tests: Recent Labs  Lab 11/27/18 1253  AST 19  ALT 27  ALKPHOS 47  BILITOT 0.8  PROT 6.2*  ALBUMIN 2.9*   No results for input(s): LIPASE, AMYLASE in the last 168 hours. No results for input(s): AMMONIA in the last 168 hours. CBC: Recent Labs  Lab 11/27/18 1253 11/28/18 0208 11/29/18 0541 11/30/18 0549 12/01/18 0507  WBC 20.2* 17.8* 15.3* 13.9* 14.5*  NEUTROABS 18.2*  --  12.1* 10.3* 10.6*  HGB 12.9 12.7 11.6* 11.7* 12.1  HCT 40.4 39.6 35.8* 37.0 38.2  MCV 96.2 96.6 96.8 96.4 97.2  PLT 255 263 206 237 243   Cardiac Enzymes: No results for input(s): CKTOTAL, CKMB, CKMBINDEX, TROPONINI in the last 168 hours. BNP: BNP (last 3 results) No results for input(s): BNP in the last 8760 hours.  ProBNP (last 3 results) No results for input(s): PROBNP in the last 8760 hours.  CBG: No results for input(s): GLUCAP in the last 168 hours.  Active Problems:   Acute respiratory failure with hypoxia (HCC)   Bronchiectasis with acute exacerbation (HCC)   Pseudomonas aeruginosa infection   Thrush   CAD (coronary artery disease)   CKD (chronic kidney disease), stage III (HCC)   Essential hypertension   Generalized anxiety disorder   Peripheral neuropathy   Mycobacterium avium complex ([place])   Time coordinating discharge: 38 mintues.  Signed:        Rahkim Rabalais, DO [facility]  12/01/2018, 6:21 PM

## 2018-12-01 NOTE — Progress Notes (Signed)
Sherron Flemings to be D/C'd home per MD order. Discussed with the patient and all questions fully answered. VVS, Skin clean, dry and intact without evidence of skin break down, no evidence of skin tears noted.  PICC line in place. Oxygen delivered to bedside. An After Visit Summary was printed and given to the patient.  Patient escorted via [place], and D/C home via private auto.  Melonie [place]  12/01/2018 4:43 PM

## 2018-12-01 NOTE — TOC Transition Note (Addendum)
Transition of Care [facility]) - CM/SW Discharge Note   Patient Details  Name: Stacy Sanchez MRN: [id number] Date of Birth: 29-Mar-1946  Transition of Care [facility]) CM/SW Contact:  Sharin Mons, RN Phone Number: 12/01/2018, 2:00 PM   Clinical Narrative:    Admitted with bronchiectasis flare with pseudomonas. Pt will transition to home today with the resumption of home health services(PT,OT,RN), IV ABX therapy and oxygen. Pt will d/c to daughter 's home : [place], [place] [id number].   [place] Canty (Daughter)     [phone]      Daughter to provide transportation to home. DME oxygen and nebulizer will be delivered to bedside prior to d/c.  Final next level of care: [place] Barriers to Discharge: No Barriers Identified   Patient Goals and CMS Choice Patient states their goals for this hospitalization and ongoing recovery are:: to go home and get better CMS Medicare.gov Compare Post Acute Care list provided to:: Patient Choice offered to / list presented to : Patient  Discharge Placement                       Discharge Plan and Services   Discharge Planning Services: CM Consult Post Acute Care Choice: Durable Medical Equipment, Home Health          DME Arranged: Nebulizer machine, Oxygen, IV ABX therapy/ [facility]( arranged with Pam 11/30/2018 @ 5pm) DME Agency: [employer] Date DME Agency Contacted: 11/30/18 Time DME Agency Contacted: 46 Representative spoke with at DME Agency: [place]: RN, PT, OT [place] Agency: [facility] Date [place]: 12/01/18 Time HH Agency Contacted: [phone](Pt already active with [facility] and would like to continue)    Social Determinants of Health (SDOH) Interventions     Readmission Risk Interventions No flowsheet data found.

## 2018-12-01 NOTE — Progress Notes (Signed)
SATURATION QUALIFICATIONS: (This note is used to comply with regulatory documentation for home oxygen)  Patient Saturations on Room Air at Rest = 86%  Patient Saturations on Room Air while Ambulating = 86%  Patient Saturations on 1 Liter of oxygen while Ambulating = 91%  Please briefly explain why patient needs home oxygen: Patient desats while ambulating without oxygen and while sleeping.

## 2018-12-02 ENCOUNTER — Telehealth: Payer: Self-pay | Admitting: Adult Health

## 2018-12-02 NOTE — Telephone Encounter (Signed)
Left message for pt

## 2018-12-03 LAB — CULTURE, BLOOD (ROUTINE X 2)
Culture: NO GROWTH
Culture: NO GROWTH
Special Requests: ADEQUATE
Special Requests: ADEQUATE

## 2018-12-03 LAB — CULTURE, RESPIRATORY W GRAM STAIN

## 2018-12-04 ENCOUNTER — Encounter: Payer: Self-pay | Admitting: Adult Health

## 2018-12-04 NOTE — Telephone Encounter (Signed)
Spoke with pt's nurse, she stated that the patient received the duoneb by the hospitalist. Nothing further is needed.

## 2018-12-07 ENCOUNTER — Ambulatory Visit (INDEPENDENT_AMBULATORY_CARE_PROVIDER_SITE_OTHER): Payer: Medicare Other

## 2018-12-07 ENCOUNTER — Ambulatory Visit (INDEPENDENT_AMBULATORY_CARE_PROVIDER_SITE_OTHER): Payer: Medicare Other | Admitting: Internal Medicine

## 2018-12-07 ENCOUNTER — Inpatient Hospital Stay: Payer: 59 | Admitting: Adult Health

## 2018-12-07 ENCOUNTER — Other Ambulatory Visit: Payer: Self-pay

## 2018-12-07 ENCOUNTER — Encounter: Payer: Self-pay | Admitting: Adult Health

## 2018-12-07 ENCOUNTER — Ambulatory Visit (INDEPENDENT_AMBULATORY_CARE_PROVIDER_SITE_OTHER): Payer: Medicare Other | Admitting: Adult Health

## 2018-12-07 VITALS — BP 100/62 | HR 59 | Temp 97.8°F | Ht 67.0 in | Wt 128.4 lb

## 2018-12-07 VITALS — BP 109/70 | HR 70 | Wt 128.0 lb

## 2018-12-07 DIAGNOSIS — A498 Other bacterial infections of unspecified site: Secondary | ICD-10-CM | POA: Diagnosis not present

## 2018-12-07 DIAGNOSIS — B370 Candidal stomatitis: Secondary | ICD-10-CM

## 2018-12-07 DIAGNOSIS — J471 Bronchiectasis with (acute) exacerbation: Secondary | ICD-10-CM

## 2018-12-07 DIAGNOSIS — A310 Pulmonary mycobacterial infection: Secondary | ICD-10-CM

## 2018-12-07 DIAGNOSIS — J9611 Chronic respiratory failure with hypoxia: Secondary | ICD-10-CM

## 2018-12-07 IMAGING — DX CHEST - 2 VIEW
2 series · 2 of 2 positions shown · non-contrast
Comparison: [DATE]

CLINICAL DATA: Pneumonia. Bronchiectasis with acute exacerbation.

EXAM:
CHEST - 2 VIEW

[chest pa]
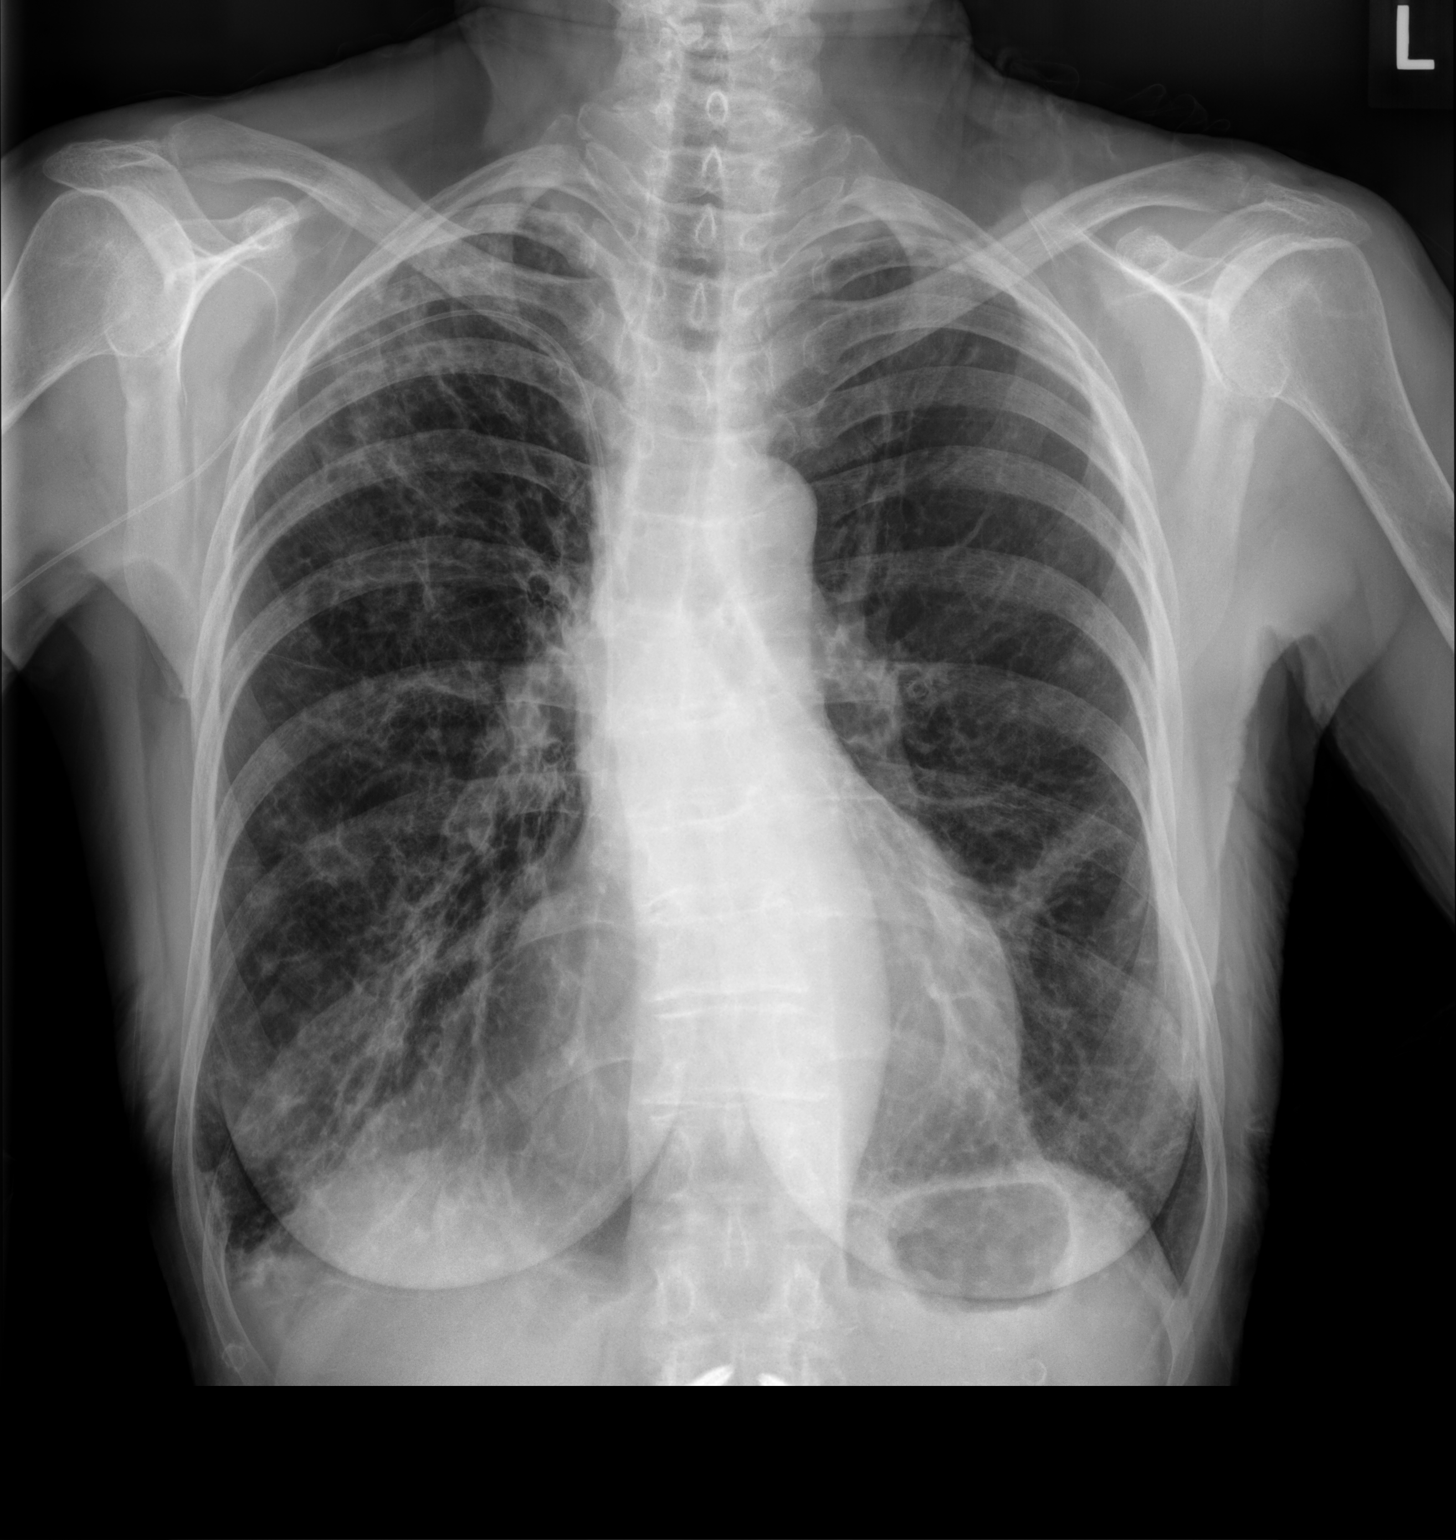

[chest lat]
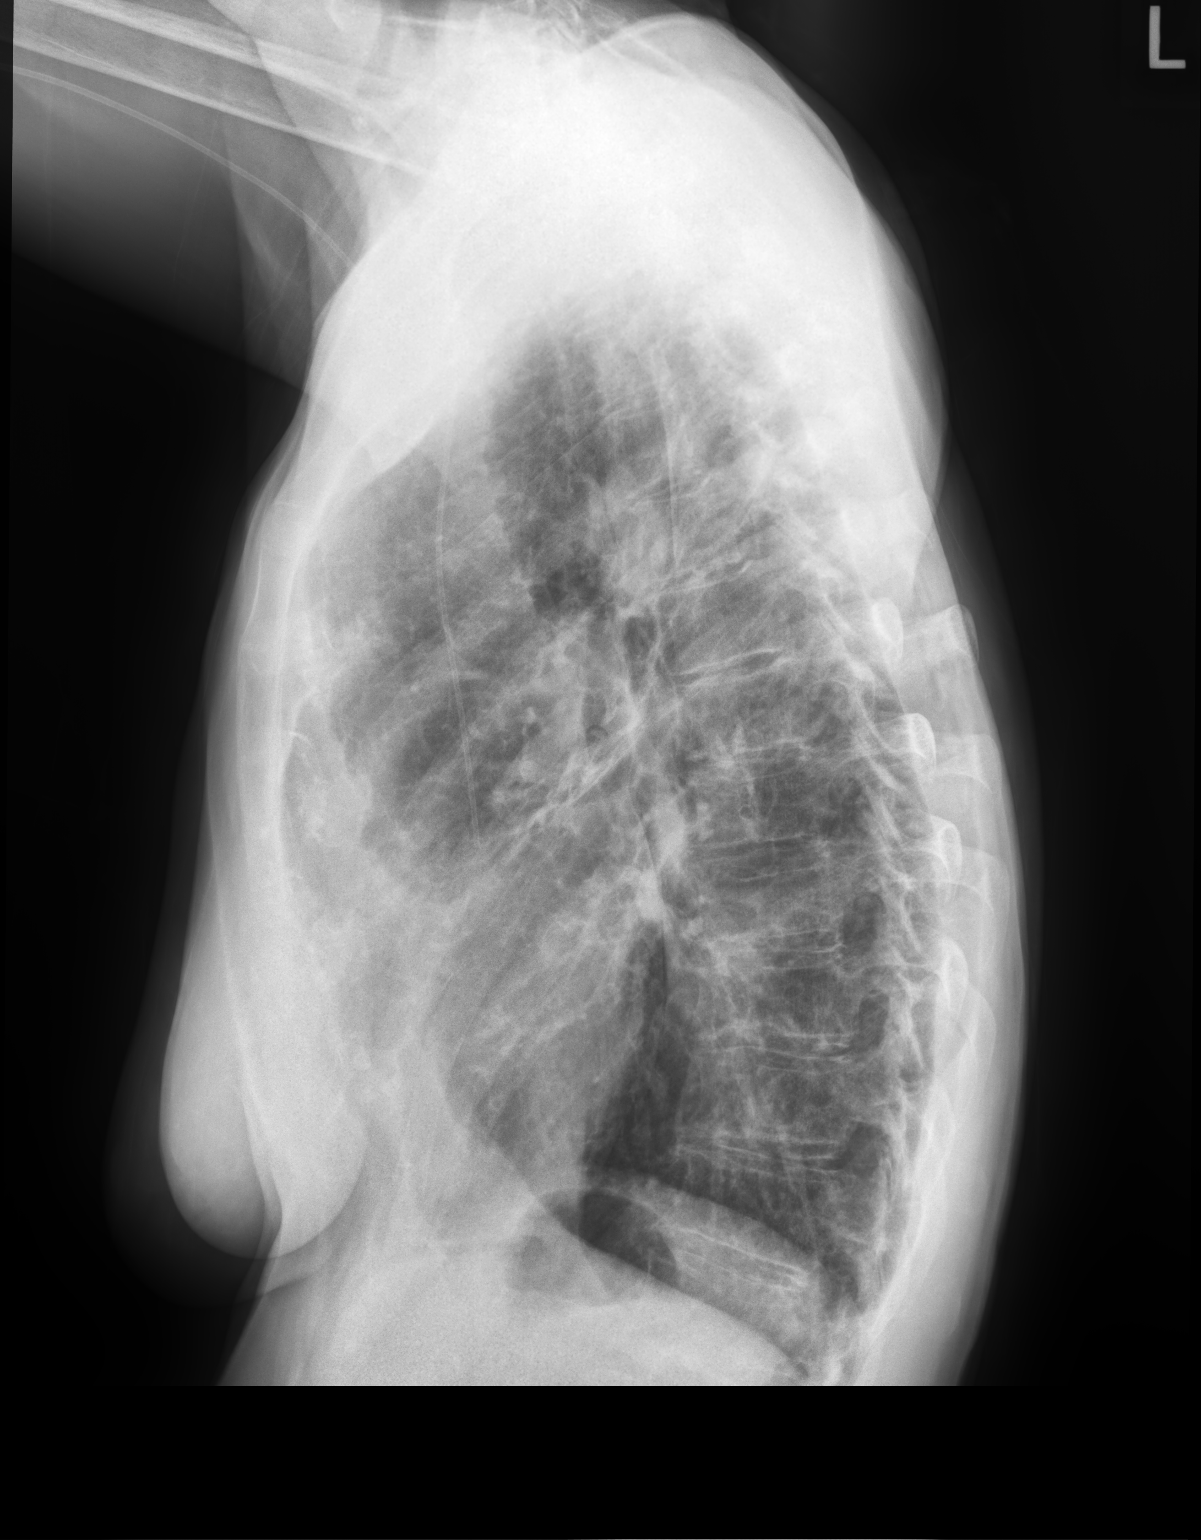

[2 of 2 positions shown; findings below may reference images not displayed]

FINDINGS: The heart size and mediastinal contours are within normal limits. A
right arm PICC line is now seen, with tip overlying the superior
cavoatrial junction.

Pulmonary hyperinflation again demonstrated. Diffuse coarsening of
interstitial markings and bronchiectasis again seen throughout the
right lung. Scarring again seen bilaterally involving the right lung
greater than left. No evidence of acute or superimposed pulmonary
infiltrate. No evidence of pleural effusion. Lumbar spine fusion
hardware again noted.
IMPRESSION: 1. Right arm PICC line tip overlies the superior cavoatrial
junction.
2. Stable appearance of chronic interstitial lung disease and
bronchiectasis. No acute cardiopulmonary disease.

## 2018-12-07 NOTE — Assessment & Plan Note (Signed)
Reason hypoxia during bronchiectasis exacerbation.  This seems to be slowly improving.  She can now discontinue oxygen at rest time.  Use oxygen 1 L with activity and at bedtime.  Will reassess on return

## 2018-12-07 NOTE — Assessment & Plan Note (Signed)
Chronic bronchiectasis with previous MAI infection.  Initial AFB sputum culture is negative.  We will continue to follow for final results.  She is continue to follow-up with infectious disease.

## 2018-12-07 NOTE — Patient Instructions (Addendum)
Use Flutter valve Three times a day   May decrease Duoneb Twice daily   Use Hypertonic Neb daily .  Use Oxygen 1l/m with activity and At bedtime   Chest xray today .  Follow up with Dr. Baxter Flattery for labs.  Follow up with Dr. Halford Chessman  In 2 months with PFT and As needed

## 2018-12-07 NOTE — Assessment & Plan Note (Signed)
Pulmonary Pseudomonas on sputum culture.-Clinically she seems to be improved since 10-day course of cefepime.  No further antibiotics at this time.  Continue follow-up with infectious disease as recommended

## 2018-12-07 NOTE — Assessment & Plan Note (Signed)
Reason acute exacerbation of bronchiectasis.  Positive pseudomonas infection.  Initial AFB cultures were negative.  Finals are pending. Patient is clinically improving.  Chest x-ray today shows chronic bronchiectasis changes. Patient can decrease her DuoNeb nebulizer to twice daily.  Continue on hypertonic nebs daily.  Will check PFTs on return.  Would like to wait out 6 to 8 weeks since she is recently had back surgery.  Also will hold on vest therapy at this time due to her recent back surgery she will continue on flutter valve. On return consider changing DuoNeb to as needed and increasing hypertonic to twice daily.  O2 saturations seem to be improving.  We will decrease her oxygen with activity and at bedtime on return we will recheck oxygen levels and consider discontinuation  Plan  Patient Instructions  Use Flutter valve Three times a day   May decrease Duoneb Twice daily   Use Hypertonic Neb daily .  Use Oxygen 1l/m with activity and At bedtime   Chest xray today .  Follow up with Dr. Baxter Flattery for labs.  Follow up with Dr. Halford Chessman  In 2 months with PFT and As needed

## 2018-12-07 NOTE — Assessment & Plan Note (Signed)
Noted to have thrush during her hospitalization treated with Diflucan.  Oral exam today shows resolution of oral thrush

## 2018-12-07 NOTE — Progress Notes (Signed)
_0  ID: Stacy Sanchez, female    DOB: 09-08-1945, 73 y.o.   MRN: [id number]  Chief Complaint  Patient presents with   Follow-up    Bronchiecatsis     Referring provider: Burnett Sheng, MD  HPI: 73 year old female never smoker seen for pulmonary consult during hospitalization August 2020.  Patient carries a diagnosis of bronchiectasis with MAI (diagnosed 2001) followed at [facility] by infectious disease.  She has been on multiple antimicrobial and antifungal treatments in the past including clarithromycin ethambutol rifampin and inhaled gentamicin.  Last on inhaled gentamicin October 2018.  Followed by Brigitte Pulse, MD at [facility].  TEST/EVENTS : CT chest November 27, 2018 showed extensive chronic lung disease with a mosaic attenuation, bronchiectasis, central airway thickening and mucous plugging.  Greatest in the right middle lobe lingula and right lower lobe.  Scattered peribronchial inflammatory changes greater than the right upper lobe.  Mild emphysema  Sputum culture 810/20-Pseudomonas Aeruginosa (pansensitive), AFB negative to date Blood cultures were negative  12/07/2018 Follow up : Bronchiectasis exacerbation, post hospitalization, oxygen dependent respiratory failure Patient returns for a one-week post hospital follow-up.  Patient was recently hospitalized for an acute bronchiectatic exacerbation. Patient underwent a multilevel laminectomy L1-L4 within insertion of rods for scoliosis. [facility] in [place]. Postop she developed respiratory distress with hypoxia.  She was treated with empiric antibiotics and steroids. .  Patient was discharged home -went to recover at her daughter's home . She started to have desaturations and pleuritic chest pain.  She returned back to the hospital found to be severely hypoxic with O2 saturations in the 70s.  CTA chest was negative for PE but showed chronic signs of bronchiectasis.  She was started on empiric  antibiotics with cefepime and vancomycin and fluconazole.  She was discharged on IV cefepime through a PICC line for additional 6 days.she has completed full course .    She was discharged on oxygen 1 L/m . Placed on DuoNeb nebulizer 3 times daily and hypertonic nebs daily . She is feeling some better, energy level is picking up .  Cough /congestion are decreased. No fever.  Uses flutter valve and IS several times a day .  Has never used VEST therapy or nebs in past.  Says O2 sats doing well on oxygen . Check oxygen at home yesterday off oxygen >90.  Today at rest O2 sats 95% on room air.  Has sitter with her and uses walker .   Seen by ID this am , recommended for PICC line to be removed .  Patient recently had lab work done by home health.  Faxed over labs ordered by infectious disease showed her white blood cell count has returned to normal.  Her ESR and CRP were elevated.  And potassium was slightly elevated at 5.3.  Have recommended she follow-up with Dr. Crissie Figures office regarding these labs.    Allergies  Allergen Reactions   Amikacin Other (See Comments)    nephropathy    Ethambutol Other (See Comments)    neuropathy   Levaquin [Levofloxacin] Other (See Comments)    Fainting and weakness   Parafon Forte Dsc [Chlorzoxazone] Hives   Rifabutin Diarrhea   Tequin [Gatifloxacin] Other (See Comments)    Fainting and weakness   Zyvox [Linezolid] Diarrhea     There is no immunization history on file for this patient.  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     aspirin EC 81 MG tablet Take 81 mg by mouth daily.     ceFEPime (MAXIPIME) IVPB Inject 2 g into the vein every 12 (twelve) hours for 5 days. Indication:  Pseudomonas Bronchiectasis Last Day of Therapy:  12/06/18 Labs - Once weekly:  CBC/D and BMP, Labs - Every other week:  ESR and CRP 10 Units 0   escitalopram (LEXAPRO) 5 MG tablet Take 5 mg by mouth daily.     fluconazole (DIFLUCAN) 200 MG tablet Take 1 tablet (200 mg total) by mouth daily for 6 days. 6 tablet 0   gabapentin (NEURONTIN) 300 MG capsule Take 300 mg by mouth 2 (two) times daily.     guaiFENesin (MUCINEX) 600 MG 12 hr tablet Take 2 tablets (1,200 mg total) by mouth 2 (two) times daily. 20 tablet 0   HYDROcodone-acetaminophen (NORCO/VICODIN) 5-325 MG tablet Take 0.5 tablets by mouth every 6 (six) hours as needed for moderate pain.     ipratropium-albuterol (DUONEB) 0.5-2.5 (3) MG/3ML SOLN Take 3 mLs by nebulization 3 (three) times daily. 360 mL 0   levothyroxine (SYNTHROID, LEVOTHROID) 50 MCG tablet Take 50 mcg by mouth daily before breakfast. Take 1 tablet by mouth daily except for Saturday and Sunday taking 2 tablets (164mg)     Multiple Vitamin (MULTIVITAMIN WITH MINERALS) TABS tablet Take 1 tablet by mouth daily.     nadolol (CORGARD) 20 MG tablet Take 10 mg by mouth 2 (two) times daily.      orphenadrine (NORFLEX) 100 MG tablet Take 100 mg by mouth 2 (two) times daily as needed for muscle spasms.     polyethylene glycol powder (GLYCOLAX/MIRALAX) 17 GM/SCOOP powder Take 17 g by mouth daily.     rosuvastatin (CRESTOR) 10 MG tablet Take 10 mg by mouth daily.     senna (SENOKOT) 8.6 MG TABS tablet Take 1 tablet by mouth daily as needed for mild constipation.     sodium chloride HYPERTONIC 3 % nebulizer solution Take 4 mLs by nebulization daily. 750 mL 12   amLODipine (NORVASC) 5 MG tablet Take 5 mg by mouth daily.      Nebulizers (COMPRESSOR/NEBULIZER) MISC 1 Product by Does  not apply route once for 1 dose. 1 each 0   No facility-administered medications prior to visit.      Review of Systems:   Constitutional:   No  weight loss, night sweats,  Fevers, chills, + fatigue, or  lassitude.  HEENT:   No headaches,  Difficulty swallowing,  Tooth/dental problems, or  Sore throat,                No sneezing, itching, ear ache,  +nasal congestion, post nasal drip,   CV:  No chest pain,  Orthopnea, PND, swelling in lower extremities, anasarca, dizziness, palpitations, syncope.   GI  No heartburn, indigestion, abdominal pain, nausea, vomiting, diarrhea, change in bowel habits, loss of appetite, bloody stools.   Resp:  .  No chest wall deformity  Skin: no rash or lesions.  GU: no dysuria, change in color of urine, no urgency  or frequency.  No flank pain, no hematuria   MS:  No joint pain or swelling.  No decreased range of motion.  No back pain.    Physical Exam  BP 100/62    Pulse (!) 59    Temp 97.8 F (36.6 C) (Oral)    Ht _0  (1.702 m)    Wt 128 lb 6.4 oz (58.2 kg)    SpO2 96%    BMI 20.11 kg/m   GEN: A/Ox3; pleasant , NAD, frail and elderly , walker    HEENT:  [place]/AT,  EACs-clear, TMs-wnl, NOSE-clear, THROAT-clear, no lesions, no postnasal drip or exudate noted.   NECK:  Supple w/ fair ROM; no JVD; normal carotid impulses w/o bruits; no thyromegaly or nodules palpated; no lymphadenopathy.    RESP  Clear  P & A; w/o, wheezes/ rales/ or rhonchi. no accessory muscle use, no dullness to percussion  CARD:  RRR, no m/r/g, no peripheral edema, pulses intact, no cyanosis or clubbing. Right UE PICC -  GI:   Soft & nt; nml bowel sounds; no organomegaly or masses detected.   Musco: Warm bil, no deformities or joint swelling noted.   Neuro: alert, no focal deficits noted.    Skin: Warm, no lesions or rashes    Lab Results:  CBC    Component Value Date/Time   WBC 14.5 (H) 12/01/2018 0507   RBC 3.93 12/01/2018 0507   HGB 12.1 12/01/2018 0507    HCT 38.2 12/01/2018 0507   PLT 243 12/01/2018 0507   MCV 97.2 12/01/2018 0507   MCH 30.8 12/01/2018 0507   MCHC 31.7 12/01/2018 0507   RDW 14.9 12/01/2018 0507   LYMPHSABS 2.2 12/01/2018 0507   MONOABS 1.1 (H) 12/01/2018 0507   EOSABS 0.3 12/01/2018 0507   BASOSABS 0.1 12/01/2018 0507    BMET    Component Value Date/Time   NA 137 12/01/2018 0507   K 4.2 12/01/2018 0507   CL 95 (L) 12/01/2018 0507   CO2 30 12/01/2018 0507   GLUCOSE 96 12/01/2018 0507   BUN 23 12/01/2018 0507   CREATININE 0.95 12/01/2018 0507   CALCIUM 8.6 (L) 12/01/2018 0507   GFRNONAA 59 (L) 12/01/2018 0507   GFRAA >60 12/01/2018 0507    BNP No results found for: BNP  ProBNP No results found for: PROBNP  Imaging: Dg Chest 2 View  Result Date: 12/07/2018 CLINICAL DATA:  Pneumonia. Bronchiectasis with acute exacerbation. EXAM: CHEST - 2 VIEW COMPARISON:  11/30/2018 FINDINGS: The heart size and mediastinal contours are within normal limits. A right arm PICC line is now seen, with tip overlying the superior cavoatrial junction. Pulmonary hyperinflation again demonstrated. Diffuse coarsening of interstitial markings and bronchiectasis again seen throughout the right lung. Scarring again seen bilaterally involving the right lung greater than left. No evidence of acute or superimposed pulmonary infiltrate. No evidence of pleural effusion. Lumbar spine fusion hardware again noted. IMPRESSION: 1. Right arm PICC line tip overlies the superior cavoatrial junction. 2. Stable appearance of chronic interstitial lung disease and bronchiectasis. No acute cardiopulmonary disease. Electronically Signed   By: Marlaine Hind M.D.   On: 12/07/2018 15:29   Dg Chest 2 View  Result Date: 11/30/2018 CLINICAL DATA:  Bronchiectasis, pulmonary MAC, shortness of breath and hypoxia. EXAM: CHEST - 2 VIEW COMPARISON:  CTA of the chest on 11/27/2018 FINDINGS: Normal heart size and mediastinal contours. Prominent reticulonodular opacities in  both upper and lower right lung are consistent with CT findings of bronchiectasis, mucous plugging  and peripheral pulmonary nodularity. Superimposed acute pneumonia would be difficult to exclude. There is some scarring in the left lung with mild nodularity. No pneumothorax, pulmonary edema or significant pleural fluid. IMPRESSION: Prominent reticulonodular opacities in the upper and lower right lung fields consistent with bronchiectasis, mucous plugging and peripheral nodularity seen by CT. It would be difficult to exclude a component of acute pneumonia. Electronically Signed   By: Aletta Edouard M.D.   On: 11/30/2018 08:24   Ct Angio Chest Pe W And/or Wo Contrast  Result Date: 11/27/2018 CLINICAL DATA:  Shortness of breath. History of MAC lung infection with recent back surgery 2 weeks ago. Right chest pain and shortness of breath. Concern for pulmonary embolism. EXAM: CT ANGIOGRAPHY CHEST WITH CONTRAST TECHNIQUE: Multidetector CT imaging of the chest was performed using the standard protocol during bolus administration of intravenous contrast. Multiplanar CT image reconstructions and MIPs were obtained to evaluate the vascular anatomy. CONTRAST:  159m OMNIPAQUE IOHEXOL 350 MG/ML SOLN COMPARISON:  None. FINDINGS: Cardiovascular: The pulmonary arteries are well opacified with contrast to the level of the subsegmental branches. There is no evidence of acute pulmonary embolism. Minimal atherosclerosis of the aorta and great vessels. No acute vascular findings are seen. The heart size is normal. There is no pericardial effusion. Mediastinum/Nodes: There is a prominent right hilar node measuring 13 mm on image 63/5. There is a subcarinal node measuring 15 mm on image 77/5. No other enlarged mediastinal, hilar or axillary lymph nodes are demonstrated. The thyroid gland, trachea and esophagus demonstrate no significant findings. Lungs/Pleura: There is no pleural effusion or pneumothorax. There is extensive chronic  lung disease with mosaic attenuation, bronchiectasis, central airway thickening and mucous plugging. The bronchiectasis is greatest in the right middle lobe, lingula and right lower lobe. There are scattered peribronchial inflammatory changes, greatest in the right upper lobe. No consolidation or suspicious pulmonary nodule identified. There is mild underlying emphysema. Upper abdomen: No significant findings are seen within the visualized upper abdomen. 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 Musculoskeletal/Chest wall: There is no chest wall mass or suspicious osseous finding. Review of the MIP images confirms the above findings. IMPRESSION: 1. No evidence of acute pulmonary embolism or other acute vascular process. 2. 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 Without comparison studies, it is difficult to exclude acute superimposed inflammation, especially in the right upper lobe. At least short term chest radiographic follow up recommended. CT follow-up in 3-6 months should be considered. 3. Mildly prominent right hilar and subcarinal lymph nodes, likely reactive. Electronically Signed   By: WRichardean SaleM.D.   On: 11/27/2018 16:00   [place] Site Rite  Result Date: 11/30/2018 If Site Rite image not attached, placement could not be confirmed due to current cardiac rhythm.     No flowsheet data found.  No results found for: NITRICOXIDE      Assessment & Plan:   Bron[place]ation [place] Reason acute exacerbation of bronchiectasis.  Positive pseudomonas infection.  Initial AFB cultures were negative.  Finals are pending. Patient is clinically improving.  Chest x-ray today shows chronic bronchiectasis changes. Patient can decrease her DuoNeb nebulizer to twice daily.  Continue on hypertonic nebs daily.  Will check PFTs on return.  Would like to wait out 6 to 8 weeks since she is recently had back surgery.  Also  will hold on vest therapy at this time due to her recent back surgery she will continue  on flutter valve. On return consider changing DuoNeb to as needed and increasing hypertonic to twice daily.  O2 saturations seem to be improving.  We will decrease her oxygen with activity and at bedtime on return we will recheck oxygen levels and consider discontinuation  Plan  Patient Instructions  Use Flutter valve Three times a day   May decrease Duoneb Twice daily   Use Hypertonic Neb daily .  Use Oxygen 1l/m with activity and At bedtime   Chest xray today .  Follow up with Dr. Baxter Flattery for labs.  Follow up with Dr. Halford Chessman  In 2 months with PFT and As needed        Thrush Noted to have thrush during her hospitalization treated with Diflucan.  Oral exam today shows resolution of oral thrush  Pseudomonas aeruginosa infection Pulmonary Pseudomonas on sputum culture.-Clinically she seems to be improved since 10-day course of cefepime.  No further antibiotics at this time.  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  Initial AFB sputum culture is negative.  We will continue to follow for final results.  She is continue to follow-up with infectious disease.  Chronic respiratory failure with hypoxia (HCC) Reason hypoxia during bronchiectasis exacerbation.  This seems to be slowly improving.  She can now discontinue oxygen at rest time.  Use oxygen 1 L with activity and at bedtime.  Will reassess on return     Rexene Edison, NP 12/07/2018

## 2018-12-07 NOTE — Progress Notes (Signed)
Patient ID: Stacy Sanchez, female   DOB: 1945/08/26, 73 y.o.   MRN: [id number]  HPI  She just completed 10 days of cefepime - has no cough. Very little sputum production with inhaled nebulizer. Had some candidiasis and started on fluconazole. Though she thinks fluconazole decreases her blood pressure.  Outpatient Encounter Medications as of 12/07/2018  Medication Sig  . acetaminophen (TYLENOL) 650 MG CR tablet Take 650 mg by mouth every 8 (eight) hours as needed for pain.  Marland Kitchen amLODipine (NORVASC) 5 MG tablet Take 5 mg by mouth daily.   Marland Kitchen aspirin EC 81 MG tablet Take 81 mg by mouth daily.  Marland Kitchen ceFEPime (MAXIPIME) IVPB Inject 2 g into the vein every 12 (twelve) hours for 5 days. Indication:  Pseudomonas Bronchiectasis Last Day of Therapy:  12/06/18 Labs - Once weekly:  CBC/D and BMP, Labs - Every other week:  ESR and CRP  . escitalopram (LEXAPRO) 5 MG tablet Take 5 mg by mouth daily.  . fluconazole (DIFLUCAN) 200 MG tablet Take 1 tablet (200 mg total) by mouth daily for 6 days.  Marland Kitchen gabapentin (NEURONTIN) 300 MG capsule Take 300 mg by mouth 2 (two) times daily.  Marland Kitchen guaiFENesin (MUCINEX) 600 MG 12 hr tablet Take 2 tablets (1,200 mg total) by mouth 2 (two) times daily.  Marland Kitchen HYDROcodone-acetaminophen (NORCO/VICODIN) 5-325 MG tablet Take 0.5 tablets by mouth every 6 (six) hours as needed for moderate pain.  Marland Kitchen ipratropium-albuterol (DUONEB) 0.5-2.5 (3) MG/3ML SOLN Take 3 mLs by nebulization 3 (three) times daily.  Marland Kitchen levothyroxine (SYNTHROID, LEVOTHROID) 50 MCG tablet Take 50 mcg by mouth daily before breakfast. Take 1 tablet by mouth daily except for Saturday and Sunday taking 2 tablets (142mg)  . Multiple Vitamin (MULTIVITAMIN WITH MINERALS) TABS tablet Take 1 tablet by mouth daily.  . nadolol (CORGARD) 20 MG tablet Take 10 mg by mouth 2 (two) times daily.   . orphenadrine (NORFLEX) 100 MG tablet Take 100 mg by mouth 2 (two) times daily as needed for muscle spasms.  . polyethylene glycol powder  (GLYCOLAX/MIRALAX) 17 GM/SCOOP powder Take 17 g by mouth daily.  . rosuvastatin (CRESTOR) 10 MG tablet Take 10 mg by mouth daily.  .Marland Kitchensenna (SENOKOT) 8.6 MG TABS tablet Take 1 tablet by mouth daily as needed for mild constipation.  . sodium chloride HYPERTONIC 3 % nebulizer solution Take 4 mLs by nebulization daily.  . Nebulizers (COMPRESSOR/NEBULIZER) MISC 1 Product by Does not apply route once for 1 dose.   No facility-administered encounter medications on file as of 12/07/2018.      Patient Active Problem List   Diagnosis Date Noted  . Acute respiratory failure with hypoxia ([place] 11/27/2018  . Bronchiectasis with acute exacerbation ([facility] 11/27/2018  . 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 Thrush 11/27/2018  . CAD (coronary artery disease) 11/27/2018  . CKD (chronic kidney disease), stage III ([facility] 11/27/2018  . Essential hypertension 11/27/2018  . Generalized anxiety disorder 11/27/2018  . Peripheral neuropathy 11/27/2018  . Mycobacterium avium complex ([place] 11/27/2018     Health Maintenance Due  Topic Date Due  . Hepatitis C Screening  012-18-1947 . COLONOSCOPY  07/20/1993  . DEXA SCAN  07/20/2008  . INFLUENZA VACCINE  11/21/2018     Review of Systems 12 point ros is negative except what is mentioned above Physical Exam   BP 109/70   Pulse 70   Wt 128 lb (58.1 kg)   BMI 20.05 kg/m  Physical Exam  Constitutional:  oriented to  person, place, and time. appears well-developed and well-nourished. No distress.  HENT: [place]/AT, PERRLA, no scleral icterus Mouth/Throat: Oropharynx is clear and moist. No oropharyngeal exudate.  Cardiovascular: Normal rate, regular rhythm and normal heart sounds. Exam reveals no gallop and no friction rub.  No murmur heard.  Pulmonary/Chest: Effort normal and breath sounds normal. No respiratory distress.  has no wheezes.  Neck = supple, no nuchal rigidity Abdominal: Soft. Bowel sounds are normal.  exhibits no distension. There is no  tenderness.  Lymphadenopathy: no cervical adenopathy. No axillary adenopathy Neurological: alert and oriented to person, place, and time.  Skin: Skin is warm and dry. No rash noted. No erythema.  Psychiatric: a normal mood and affect.  behavior is normal.   CBC Lab Results  Component Value Date   WBC 14.5 (H) 12/01/2018   RBC 3.93 12/01/2018   HGB 12.1 12/01/2018   HCT 38.2 12/01/2018   PLT 243 12/01/2018   MCV 97.2 12/01/2018   MCH 30.8 12/01/2018   MCHC 31.7 12/01/2018   RDW 14.9 12/01/2018   LYMPHSABS 2.2 12/01/2018   MONOABS 1.1 (H) 12/01/2018   EOSABS 0.3 12/01/2018    BMET Lab Results  Component Value Date   NA 137 12/01/2018   K 4.2 12/01/2018   CL 95 (L) 12/01/2018   CO2 30 12/01/2018   GLUCOSE 96 12/01/2018   BUN 23 12/01/2018   CREATININE 0.95 12/01/2018   CALCIUM 8.6 (L) 12/01/2018   GFRNONAA 59 (L) 12/01/2018   GFRAA >60 12/01/2018      Assessment and Plan  acute exacerbation of bronchiectasis = finished abtx regimen for exacerbation. Will likely recommend to continue to do flutter valve. - sees pulmonology this afternoon

## 2018-12-10 ENCOUNTER — Telehealth: Payer: Self-pay | Admitting: Pulmonary Disease

## 2018-12-10 MED ORDER — SODIUM CHLORIDE 3 % IN NEBU: 4.0000 mL | INHALATION_SOLUTION | Freq: Every day | RESPIRATORY_TRACT | 11 refills | Status: DC

## 2018-12-10 MED ORDER — IPRATROPIUM-ALBUTEROL 0.5-2.5 (3) MG/3ML IN SOLN: 3.0000 mL | Freq: Two times a day (BID) | RESPIRATORY_TRACT | 5 refills | Status: DC

## 2018-12-10 NOTE — Telephone Encounter (Signed)
Called and spoke with Patient.  Patient requested 3% hypertonic and duo nebs prescription to be sent to her mail order, [place].  Refills to sen to requested pharmacy. Nothing further at this time.  LOV 8/17/20Lynelle Smoke, NP  Instructions    Return in about 2 months (around 02/06/2019) for Follow up Dr. Halford Chessman. Use Flutter valve Three times a day   May decrease Duoneb Twice daily   Use Hypertonic Neb daily .  Use Oxygen 1l/m with activity and At bedtime   Chest xray today .  Follow up with Dr. Baxter Flattery for labs.  Follow up with Dr. Halford Chessman  In 2 months with PFT and As needed

## 2018-12-13 NOTE — Progress Notes (Signed)
Reviewed and agree with assessment/plan.   Jehiel Koepp, MD [facility] 04/17/2016, 12:24 PM Pager:  [phone]  

## 2018-12-14 ENCOUNTER — Telehealth: Payer: Self-pay | Admitting: Pulmonary Disease

## 2018-12-14 NOTE — Telephone Encounter (Signed)
Called and spoke with Patient.  Patient was questioning how long she may have to stay on O2.  Patient stated she takes her O2 off and checks her sats.  Patient stated her sats drop into the 80's.  Patient stated she puts her O2 back on. Patient stated she was concerned that she may always be in O2. Patient stated she is following up with [facility] in 2 months.  Advised patient of TP's 12/07/18, OV instructions.  Advised patient to continue to check her O2 sats when she is and is not using O2. Advised Patient to use O2 at bedtime. Advised Patient to call office if she has any questions or concerns.  Understanding stated. Nothing further at this time.   Patient Instructions  Use Flutter valve Three times a day   May decrease Duoneb Twice daily   Use Hypertonic Neb daily .  Use Oxygen 1l/m with activity and At bedtime   Chest xray today .  Follow up with Dr. Baxter Flattery for labs.  Follow up with Dr. Halford Chessman  In 2 months with PFT and As needed

## 2019-01-05 ENCOUNTER — Other Ambulatory Visit: Payer: Self-pay

## 2019-01-05 ENCOUNTER — Ambulatory Visit (INDEPENDENT_AMBULATORY_CARE_PROVIDER_SITE_OTHER): Payer: Medicare Other | Admitting: Internal Medicine

## 2019-01-05 DIAGNOSIS — A310 Pulmonary mycobacterial infection: Secondary | ICD-10-CM

## 2019-01-05 DIAGNOSIS — J471 Bronchiectasis with (acute) exacerbation: Secondary | ICD-10-CM | POA: Diagnosis present

## 2019-01-05 NOTE — Progress Notes (Signed)
RFV: hospital follow up Patient ID: Stacy Sanchez, female   DOB: 02-23-1946, 73 y.o.   MRN: [id number]  HPI Harveen is a 73yo F with bronchiectasis, who was hospitalized for acute exacerbation 2/2 pseudomonas. She was treated with 14 d course of cefepime and fluconazole for which she tolerated. She noticed that her sputum is clear, still has productive cough with the use of flutter valve. Still deconditioned slightly.  Outpatient Encounter Medications as of 01/05/2019  Medication Sig  . acetaminophen (TYLENOL) 650 MG CR tablet Take 650 mg by mouth every 8 (eight) hours as needed for pain.  Marland Kitchen amLODipine (NORVASC) 5 MG tablet Take 5 mg by mouth daily.   Marland Kitchen aspirin EC 81 MG tablet Take 81 mg by mouth daily.  Marland Kitchen escitalopram (LEXAPRO) 5 MG tablet Take 5 mg by mouth daily.  Marland Kitchen gabapentin (NEURONTIN) 300 MG capsule Take 300 mg by mouth 2 (two) times daily.  Marland Kitchen guaiFENesin (MUCINEX) 600 MG 12 hr tablet Take 2 tablets (1,200 mg total) by mouth 2 (two) times daily.  Marland Kitchen HYDROcodone-acetaminophen (NORCO/VICODIN) 5-325 MG tablet Take 0.5 tablets by mouth every 6 (six) hours as needed for moderate pain.  Marland Kitchen ipratropium-albuterol (DUONEB) 0.5-2.5 (3) MG/3ML SOLN Take 3 mLs by nebulization 2 (two) times daily.  Marland Kitchen levothyroxine (SYNTHROID, LEVOTHROID) 50 MCG tablet Take 50 mcg by mouth daily before breakfast. Take 1 tablet by mouth daily except for Saturday and Sunday taking 2 tablets (118mg)  . Multiple Vitamin (MULTIVITAMIN WITH MINERALS) TABS tablet Take 1 tablet by mouth daily.  . nadolol (CORGARD) 20 MG tablet Take 10 mg by mouth 2 (two) times daily.   . Nebulizers (COMPRESSOR/NEBULIZER) MISC 1 Product by Does not apply route once for 1 dose.  . polyethylene glycol powder (GLYCOLAX/MIRALAX) 17 GM/SCOOP powder Take 17 g by mouth daily.  . rosuvastatin (CRESTOR) 10 MG tablet Take 10 mg by mouth daily.  .Marland Kitchensenna (SENOKOT) 8.6 MG TABS tablet Take 1 tablet by mouth daily as needed for mild constipation.  . sodium  chloride HYPERTONIC 3 % nebulizer solution Take 4 mLs by nebulization daily.   No facility-administered encounter medications on file as of 01/05/2019.      Patient Active Problem List   Diagnosis Date Noted  . Chronic respiratory failure with hypoxia ([facility] 12/07/2018  . Acute respiratory failure with hypoxia ([facility] 11/27/2018  . Bronchiectasis with acute exacerbation (HE[facility] [place] 11/27/2018  . 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 Thrush 11/27/2018  . CAD (coronary artery disease) 11/27/2018  . CKD (chronic kidney disease), stage III (H[facility] 11/27/2018  . Essential hypertension 11/27/2018  . Generalized anxiety disorder 11/27/2018  . Peripheral neuropathy 11/27/2018  . Mycobacterium avium complex (HM[place] 11/27/2018     Health Maintenance Due  Topic Date Due  . Hepatitis C Screening  001/26/1947 . COLONOSCOPY  07/20/1993  . DEXA SCAN  07/20/2008  . INFLUENZA VACCINE  11/21/2018    Social History   Tobacco Use  . Smoking status: Never Smoker  . Smokeless tobacco: Never Used  Substance Use Topics  . Alcohol use: No  . Drug use: No   Review of Systems +shortness of breath, occasional cough, fatigue, good appetite. Starting to gain weight. Physical Exam   SpO2 96%   Physical Exam  Constitutional:  oriented to person, place, and time. appears thin, under nourished. No distress.  HENT: [place]/AT, PERRLA, no scleral icterus, wearing nasal cannula Mouth/Throat: Oropharynx is clear and moist. No oropharyngeal exudate.  Cardiovascular: Normal rate, regular rhythm and normal heart  sounds. Exam reveals no gallop and no friction rub.  No murmur heard.  Pulmonary/Chest: Effort normal and breath sounds normal. Mild rhonchi at bases, clears with cough.  has no wheezes.  Neck = supple, no nuchal rigidity Lymphadenopathy: no cervical adenopathy. No axillary adenopathy Neurological: alert and oriented to person, place, and time.  Skin: Skin is warm and dry. No rash noted. No erythema.   Psychiatric: a normal mood and affect.  behavior is normal.   CBC Lab Results  Component Value Date   WBC 14.5 (H) 12/01/2018   RBC 3.93 12/01/2018   HGB 12.1 12/01/2018   HCT 38.2 12/01/2018   PLT 243 12/01/2018   MCV 97.2 12/01/2018   MCH 30.8 12/01/2018   MCHC 31.7 12/01/2018   RDW 14.9 12/01/2018   LYMPHSABS 2.2 12/01/2018   MONOABS 1.1 (H) 12/01/2018   EOSABS 0.3 12/01/2018    BMET Lab Results  Component Value Date   NA 137 12/01/2018   K 4.2 12/01/2018   CL 95 (L) 12/01/2018   CO2 30 12/01/2018   GLUCOSE 96 12/01/2018   BUN 23 12/01/2018   CREATININE 0.95 12/01/2018   CALCIUM 8.6 (L) 12/01/2018   GFRNONAA 59 (L) 12/01/2018   GFRAA >60 12/01/2018      Assessment and Plan  Acute flare bronchiectasis = now resolved. Continue with nebulizer, and flutter valve to help clear airways, will check back in 2-4 wk to see if improved.  Hx of mai = cultures did not reveal NTM infection

## 2019-01-13 LAB — ACID FAST CULTURE WITH REFLEXED SENSITIVITIES (MYCOBACTERIA): Acid Fast Culture: NEGATIVE

## 2019-01-19 ENCOUNTER — Other Ambulatory Visit: Payer: Self-pay

## 2019-01-19 ENCOUNTER — Ambulatory Visit (INDEPENDENT_AMBULATORY_CARE_PROVIDER_SITE_OTHER): Payer: Medicare Other | Admitting: Internal Medicine

## 2019-01-19 DIAGNOSIS — J479 Bronchiectasis, uncomplicated: Secondary | ICD-10-CM

## 2019-01-19 NOTE — Progress Notes (Signed)
RFV: follow up for bronchiectasis flare. Telephone/televisit. Stacy Sanchez has consented to doing visit over the phone, she was reached at home through clinic line  Patient ID: Stacy Sanchez, female   DOB: [birth year], 73 y.o.   MRN: [id number]  HPI 73yo F with hx of acute flare of bronchiectasis in mid August. She finished course of cefepime. Mainly fatigue. She continues to wear supplemental oxygen when she is exerting herself. Mainly walks about her home and gets mail. Feels somewhat improved since last time we spoke. Mild productive cough which is intermittent.   On review of her cultures - NTM has not been isolated  Outpatient Encounter Medications as of 01/19/2019  Medication Sig  . acetaminophen (TYLENOL) 650 MG CR tablet Take 650 mg by mouth every 8 (eight) hours as needed for pain.  Marland Kitchen amLODipine (NORVASC) 5 MG tablet Take 5 mg by mouth daily.   Marland Kitchen aspirin EC 81 MG tablet Take 81 mg by mouth daily.  Marland Kitchen escitalopram (LEXAPRO) 5 MG tablet Take 5 mg by mouth daily.  Marland Kitchen gabapentin (NEURONTIN) 300 MG capsule Take 300 mg by mouth 2 (two) times daily.  Marland Kitchen guaiFENesin (MUCINEX) 600 MG 12 hr tablet Take 2 tablets (1,200 mg total) by mouth 2 (two) times daily.  Marland Kitchen HYDROcodone-acetaminophen (NORCO/VICODIN) 5-325 MG tablet Take 0.5 tablets by mouth every 6 (six) hours as needed for moderate pain.  Marland Kitchen ipratropium-albuterol (DUONEB) 0.5-2.5 (3) MG/3ML SOLN Take 3 mLs by nebulization 2 (two) times daily.  Marland Kitchen levothyroxine (SYNTHROID, LEVOTHROID) 50 MCG tablet Take 50 mcg by mouth daily before breakfast. Take 1 tablet by mouth daily except for Saturday and Sunday taking 2 tablets (121mg)  . Multiple Vitamin (MULTIVITAMIN WITH MINERALS) TABS tablet Take 1 tablet by mouth daily.  . nadolol (CORGARD) 20 MG tablet Take 10 mg by mouth 2 (two) times daily.   . Nebulizers (COMPRESSOR/NEBULIZER) MISC 1 Product by Does not apply route once for 1 dose.  . polyethylene glycol powder (GLYCOLAX/MIRALAX) 17 GM/SCOOP  powder Take 17 g by mouth daily.  . rosuvastatin (CRESTOR) 10 MG tablet Take 10 mg by mouth daily.  .Marland Kitchensenna (SENOKOT) 8.6 MG TABS tablet Take 1 tablet by mouth daily as needed for mild constipation.  . sodium chloride HYPERTONIC 3 % nebulizer solution Take 4 mLs by nebulization daily.   No facility-administered encounter medications on file as of 01/19/2019.      Patient Active Problem List   Diagnosis Date Noted  . Chronic respiratory failure with hypoxia (H[place] 12/07/2018  . Acute respiratory failure with hypoxia (HC[place] 11/27/2018  . Bronchiectasis with acute exacerbation (H[facility] [place] 11/27/2018  . 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 Thrush 11/27/2018  . CAD (coronary artery disease) 11/27/2018  . CKD (chronic kidney disease), stage III (HE[facility] [place] 11/27/2018  . Essential hypertension 11/27/2018  . Generalized anxiety disorder 11/27/2018  . Peripheral neuropathy 11/27/2018  . Mycobacterium avium complex (HA[place] 11/27/2018     Health Maintenance Due  Topic Date Due  . Hepatitis C Screening  0Jun 10, 1947 . COLONOSCOPY  07/20/1993  . DEXA SCAN  07/20/2008  . INFLUENZA VACCINE  11/21/2018    Social History   Tobacco Use  . Smoking status: Never Smoker  . Smokeless tobacco: Never Used  Substance Use Topics  . Alcohol use: No  . Drug use: No   Review of Systems 12 point ros is negative except what is mentioned above Physical Exam   There were no vitals taken for this visit.  No exam for televisit  CBC Lab Results  Component Value Date   WBC 14.5 (H) 12/01/2018   RBC 3.93 12/01/2018   HGB 12.1 12/01/2018   HCT 38.2 12/01/2018   PLT 243 12/01/2018   MCV 97.2 12/01/2018   MCH 30.8 12/01/2018   MCHC 31.7 12/01/2018   RDW 14.9 12/01/2018   LYMPHSABS 2.2 12/01/2018   MONOABS 1.1 (H) 12/01/2018   EOSABS 0.3 12/01/2018    BMET Lab Results  Component Value Date   NA 137 12/01/2018   K 4.2 12/01/2018   CL 95 (L) 12/01/2018   CO2 30 12/01/2018   GLUCOSE 96 12/01/2018    BUN 23 12/01/2018   CREATININE 0.95 12/01/2018   CALCIUM 8.6 (L) 12/01/2018   GFRNONAA 59 (L) 12/01/2018   GFRAA >60 12/01/2018      Assessment and Plan  Bronchiectasis = appears to have recovered from flare. Will continue to observe off of abtx  Will need to continue with pulmonary hygiene regimen - continue three times a day with flutter valve - will send sputum cup to keep if needed   Spent 10 min with patient

## 2019-01-29 ENCOUNTER — Telehealth: Payer: Self-pay | Admitting: Pulmonary Disease

## 2019-01-29 NOTE — Telephone Encounter (Signed)
ATC Crystal, line went to voicemail. I have left a detailed message to Crystal providing her with the Medical Records number [phone]. Nothing further needed at this time.

## 2019-02-04 ENCOUNTER — Ambulatory Visit (INDEPENDENT_AMBULATORY_CARE_PROVIDER_SITE_OTHER): Payer: Medicare Other | Admitting: Primary Care

## 2019-02-04 ENCOUNTER — Other Ambulatory Visit: Payer: Self-pay

## 2019-02-04 ENCOUNTER — Telehealth: Payer: Self-pay | Admitting: Pulmonary Disease

## 2019-02-04 ENCOUNTER — Encounter: Payer: Self-pay | Admitting: Primary Care

## 2019-02-04 ENCOUNTER — Ambulatory Visit (INDEPENDENT_AMBULATORY_CARE_PROVIDER_SITE_OTHER): Payer: Medicare Other

## 2019-02-04 ENCOUNTER — Telehealth: Payer: Self-pay | Admitting: Primary Care

## 2019-02-04 DIAGNOSIS — J471 Bronchiectasis with (acute) exacerbation: Secondary | ICD-10-CM

## 2019-02-04 DIAGNOSIS — Z20822 Contact with and (suspected) exposure to covid-19: Secondary | ICD-10-CM

## 2019-02-04 LAB — CBC WITH DIFFERENTIAL/PLATELET
Basophils Absolute: 0 10*3/uL (ref 0.0–0.1)
Basophils Relative: 0.6 % (ref 0.0–3.0)
Eosinophils Absolute: 0.2 10*3/uL (ref 0.0–0.7)
Eosinophils Relative: 2.9 % (ref 0.0–5.0)
HCT: 35.9 % — ABNORMAL LOW (ref 36.0–46.0)
Hemoglobin: 11.6 g/dL — ABNORMAL LOW (ref 12.0–15.0)
Lymphocytes Relative: 20.9 % (ref 12.0–46.0)
Lymphs Abs: 1.5 10*3/uL (ref 0.7–4.0)
MCHC: 32.3 g/dL (ref 30.0–36.0)
MCV: 93.8 fl (ref 78.0–100.0)
Monocytes Absolute: 0.7 10*3/uL (ref 0.1–1.0)
Monocytes Relative: 9.5 % (ref 3.0–12.0)
Neutro Abs: 4.7 10*3/uL (ref 1.4–7.7)
Neutrophils Relative %: 66.1 % (ref 43.0–77.0)
Platelets: 181 10*3/uL (ref 150.0–400.0)
RBC: 3.83 Mil/uL — ABNORMAL LOW (ref 3.87–5.11)
RDW: 15.4 % (ref 11.5–15.5)
WBC: 7.1 10*3/uL (ref 4.0–10.5)

## 2019-02-04 LAB — BASIC METABOLIC PANEL
BUN: 18 mg/dL (ref 6–23)
CO2: 37 mEq/L — ABNORMAL HIGH (ref 19–32)
Calcium: 9.2 mg/dL (ref 8.4–10.5)
Chloride: 98 mEq/L (ref 96–112)
Creatinine, Ser: 0.97 mg/dL (ref 0.40–1.20)
GFR: 55.9 mL/min — ABNORMAL LOW (ref >60.00)
Glucose, Bld: 73 mg/dL (ref 70–99)
Potassium: 4.7 mEq/L (ref 3.5–5.1)
Sodium: 138 mEq/L (ref 135–145)

## 2019-02-04 IMAGING — DX DG CHEST 2V
2 series · 2 of 2 positions shown · non-contrast
Comparison: Chest x-ray [DATE], [DATE]. CT chest [DATE]

CLINICAL DATA: Productive cough.  Bronchiectasis exacerbation.

EXAM:
CHEST - 2 VIEW

[chest pa]
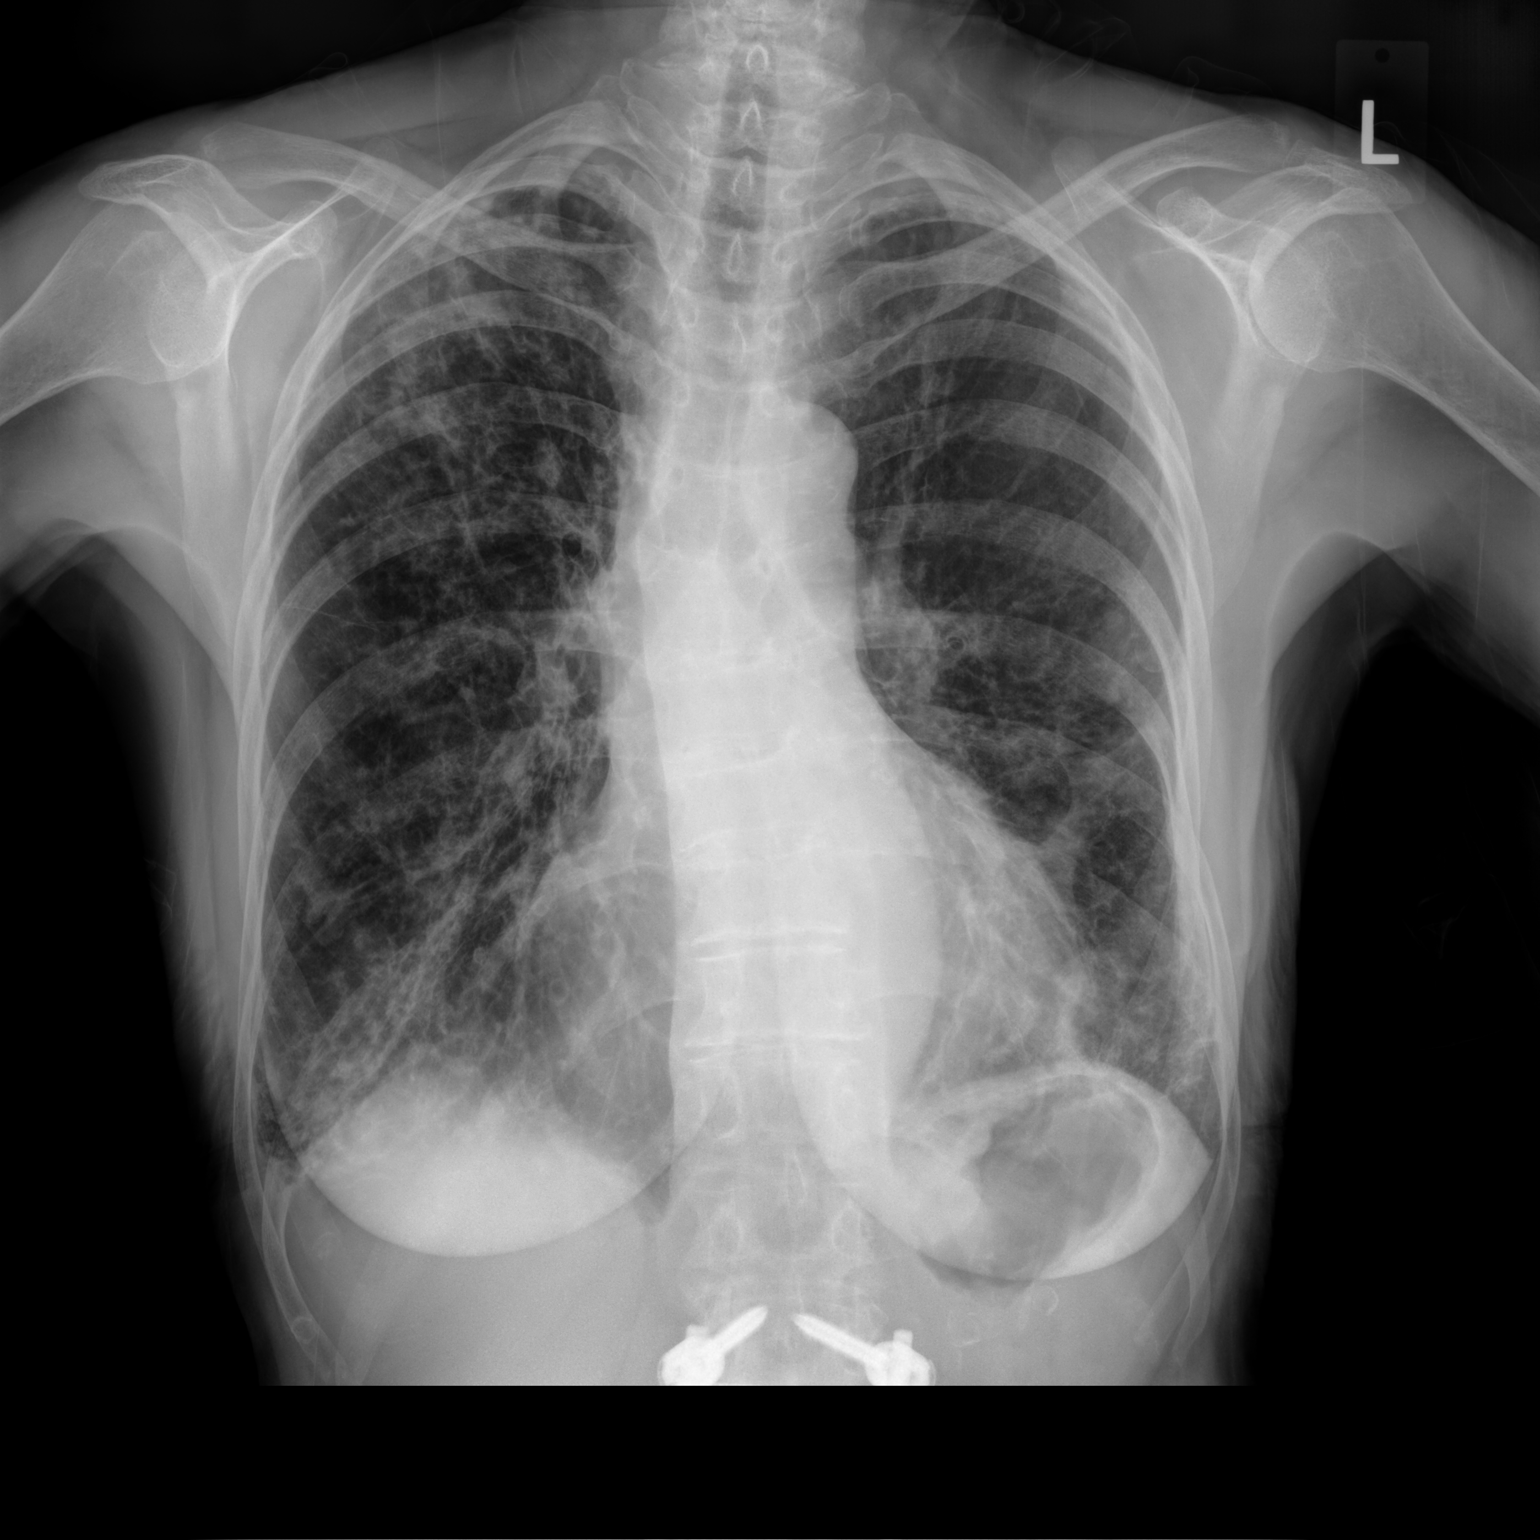

[chest lat]
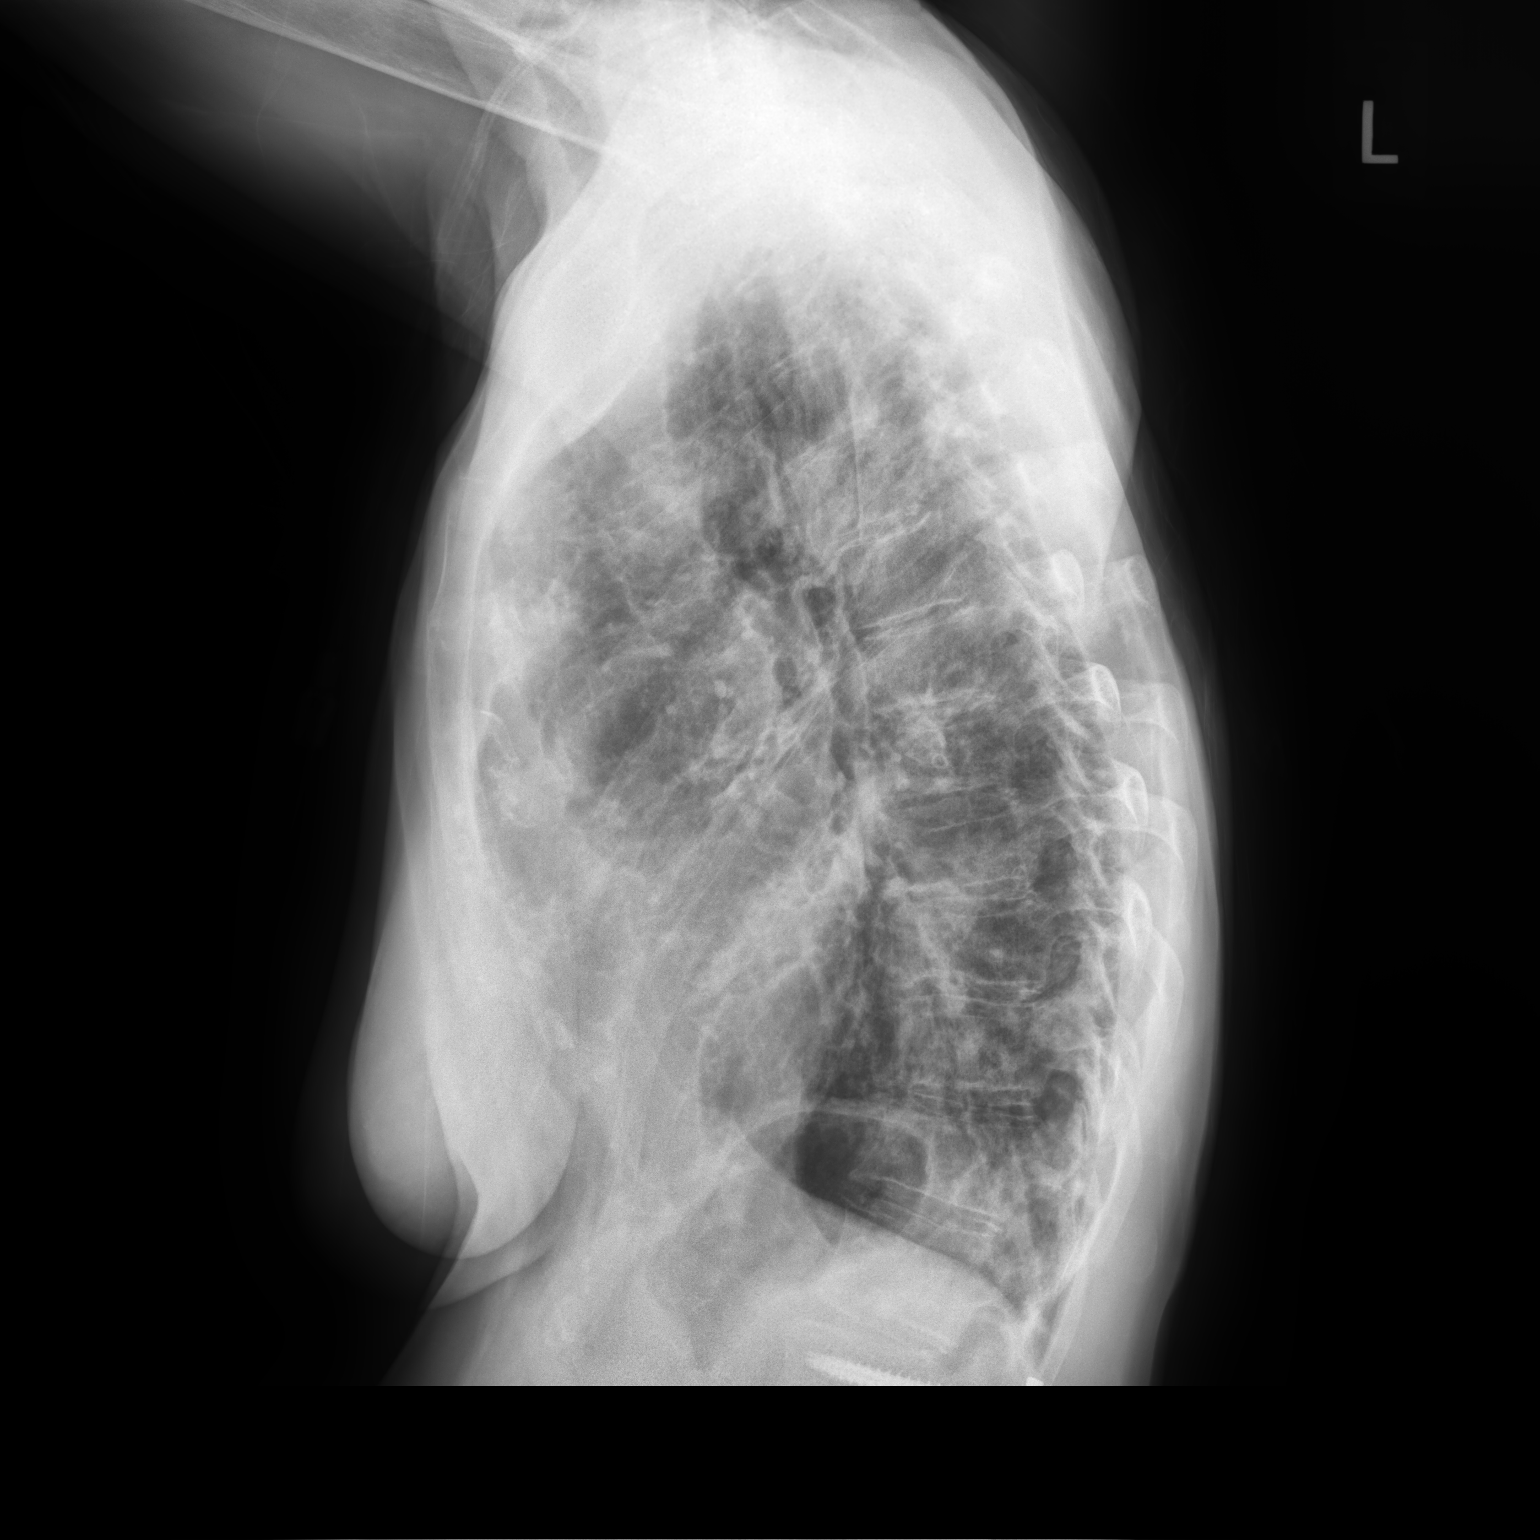

[2 of 2 positions shown; findings below may reference images not displayed]

FINDINGS: Interim removal of right PICC line. Cardiomegaly. Right upper lobe
and bibasilar atelectasis/infiltrates. No pleural effusion or
pneumothorax. Degenerative changes scoliosis thoracic spine. Prior
lumbar spine fusion.
IMPRESSION: 1.  Interim removal of right PICC line.

2. Right upper lobe and bibasilar atelectasis/infiltrates and
bronchiectasis again noted. Slight progression of infiltrates from
prior exam.

## 2019-02-04 MED ORDER — AZITHROMYCIN 250 MG PO TABS: ORAL_TABLET | ORAL | 0 refills | Status: AC

## 2019-02-04 NOTE — Telephone Encounter (Signed)
Spoke with the pt and scheduled televisit  She does not have mychart so no video visit

## 2019-02-04 NOTE — Telephone Encounter (Signed)
CXR shows increased infiltrates on right  Previous sputum cx + pseudomonas (few isolated) Pan sensitive -tx with full IV abx course   Has chronic cough , no change in mucus production, goes from white/yellow/green.   Has pleuritic left sided chest pain with deep breaths .   CXR with increased right sided chronic infiltrates, nothing on left.   Labs shows WBC is back to normal.   Hx of MAC   (fainting past with 2 quinolones ) multiple drug allergies .   Would like for her to leave a sputum culture - - please place a cup for her at front , she will come by and pick up .   Zpack take as directed. #1 , take with food.  rx sent .   Follow up as planned next week and As needed   Please contact office for sooner follow up if symptoms do not improve or worsen or seek emergency care

## 2019-02-04 NOTE — Telephone Encounter (Signed)
Plealse arrange a video or tele visit , Stacy Napoleon NP has opening this am   Please contact office for sooner follow up if symptoms do not improve or worsen or seek emergency care

## 2019-02-04 NOTE — Telephone Encounter (Signed)
Primary Pulmonologist: Sood Last office visit and with whom: 12/07/18 with TP What do we see them for (pulmonary problems): Bronchiectasis Last OV assessment/plan:  "Plan  Patient Instructions  Use Flutter valve Three times a day   May decrease Duoneb Twice daily   Use Hypertonic Neb daily .  Use Oxygen 1l/m with activity and At bedtime   Chest xray today .  Follow up with Dr. Baxter Flattery for labs.  Follow up with Dr. Halford Chessman  In 2 months with PFT and As needed  "  Was appointment offered to patient (explain)?  Thinks she needs a chest xray    Reason for call: patient woke up at 5am with left back pain when inhaling. She states this was the same from a month ago and was in the hospital for 5 days. Denies fever, shortness of breath, body aches. Patient is coughing up green sputum for about a month.  Patient on 2L o2 sating in 90s.  TP please advise

## 2019-02-04 NOTE — Patient Instructions (Addendum)
Orders: CXR today Labs (cbc,bmet) Covid testing   Recommendations: Continue Duoneb 2-3 times a day Increased hypertonic saline nebulizer twice a day  Use your Flutter valve three times a day  Take mucinex 600mg  twice daily   Follow-up: Oct 22 at 2pm with Dr. Oct 24  Bronchiectasis  Bronchiectasis is a condition in which the airways in the lungs (bronchi) are damaged and widened. The condition makes it hard for the lungs to get rid of mucus, and it causes mucus to gather in the bronchi. This condition often leads to lung infections, which can make the condition worse. What are the causes? You can be born with this condition or you can develop it later in life. Common causes of this condition include:  Cystic fibrosis.  Repeated lung infections, such as pneumonia or tuberculosis.  An object or other blockage in the lungs.  Breathing in fluid, food, or other objects (aspiration).  A problem with the immune system and lung structure that is present at birth (congenital). Sometimes the cause is not known. What are the signs or symptoms? Common symptoms of this condition include:  A daily cough that brings up mucus and lasts for more than 3 weeks.  Lung infections that happen often.  Shortness of breath and wheezing.  Weakness and fatigue. How is this diagnosed? This condition is diagnosed with tests, such as:  Chest X-rays or CT scans. These are done to check for changes in the lungs.  Breathing tests. These are done to check how well your lungs are working.  A test of a sample of your saliva (sputum culture). This test is done to check for infection.  Blood tests and other tests. These are done to check for related diseases or causes. How is this treated? Treatment for this condition depends on the severity of the illness and its cause. Treatment may include:  Medicines that loosen mucus so it can be coughed up (expectorants).  Medicines that relax the muscles of the  bronchi (bronchodilators).  Antibiotic medicines to prevent or treat infection.  Physical therapy to help clear mucus from the lungs. Techniques may include: ? Postural drainage. This is when you sit or lie in certain positions so that mucus can drain by gravity. ? Chest percussion. This involves tapping the chest or back with a cupped hand. ? Chest vibration. For this therapy, a hand or special equipment vibrates your chest and back.  Surgery to remove the affected part of the lung. This may be done in severe cases. Follow these instructions at home: Medicines  Take over-the-counter and prescription medicines only as told by your health care provider.  If you were prescribed an antibiotic medicine, take it as told by your health care provider. Do not stop taking the antibiotic even if you start to feel better.  Avoid taking sedatives and antihistamines unless your health care provider tells you to take them. These medicines tend to thicken the mucus in the lungs. Managing symptoms  Perform breathing exercises or techniques to clear your lungs as told by your health care provider.  Consider using a cold steam vaporizer or humidifier in your room or home to help loosen secretions.  If you have a cough that gets worse at night, try sleeping in a semi-upright position. General instructions  Get plenty of rest.  Drink enough fluid to keep your urine clear or pale yellow.  Stay inside when pollution and ozone levels are high.  Stay up to date with vaccinations and immunizations.  Avoid cigarette smoke and other lung irritants.  Do not use any products that contain nicotine or tobacco, such as cigarettes and e-cigarettes. If you need help quitting, ask your health care provider.  Keep all follow-up visits as told by your health care provider. This is important. Contact a health care provider if:  You cough up more sputum than before and the sputum is yellow or green in  color.  You have a fever.  You cannot control your cough and are losing sleep. Get help right away if:  You cough up blood.  You have chest pain.  You have increasing shortness of breath.  You have pain that gets worse or is not controlled with medicines.  You have a fever and your symptoms suddenly get worse. Summary  Bronchiectasis is a condition in which the airways in the lungs (bronchi) are damaged and widened. The condition makes it hard for the lungs to get rid of mucus, and it causes mucus to gather in the bronchi.  Treatment usually includes therapy to help clear mucus from the lungs.  Stay up to date with vaccinations and immunizations. This information is not intended to replace advice given to you by your health care provider. Make sure you discuss any questions you have with your health care provider. Document Released: 02/03/2007 Document Revised: 03/21/2017 Document Reviewed: 05/13/2016 Elsevier Patient Education  2020 [employer].

## 2019-02-04 NOTE — Telephone Encounter (Signed)
Left message for patient to call back  

## 2019-02-04 NOTE — Addendum Note (Signed)
Addended by: Suzzanne Cloud E on: 02/04/2019 02:37 PM   Modules accepted: Orders

## 2019-02-04 NOTE — Progress Notes (Signed)
Virtual Visit via Telephone Note  I connected with Stacy Sanchez on 02/04/19 at 11:00 AM EDT by telephone and verified that I am speaking with the correct person using two identifiers.  Location: Patient: Home Provider: Office   I discussed the limitations, risks, security and privacy concerns of performing an evaluation and management service by telephone and the availability of in person appointments. I also discussed with the patient that there may be a patient responsible charge related to this service. The patient expressed understanding and agreed to proceed.  History of Present Illness: 73 year old female, never smoked. PMH significant for bronchiectasis with MAI (diagnosed 2001), oxygen dependent. Followed at [facility] by infectious disease Dr. Brigitte Pulse. She has been on multiple antimicrobial and antifungal treatments in the past including clarithromycin, ethambutol, rifampin and inhaled gentamicin. Pulmonary consulted during hospitalization August 2020.   02/04/2019 Patient contacted today for televisit with reports of increased cough. Hospitalized in August for pneumonia. Woke up this morning with left side back pain while taking deep breath. Coughing up green mucus for 1 month. Using hypertonic saline nebulizer once daily and Duoneb twice a day. She is also using flutter valve 2-3 times a day. Wearing 1.5-2L oxygen. States that she can't find her oximeter but last O2 readings were 92-94% on 2L. Denies fever, chest tightness or wheezing. Scheduled for upcoming visit with Dr. Halford Chessman next week.    Observations/Objective:  - No obvious shortness of breath, wheezing or cough noted during phone conversation  Oxygen level: - O2 92-94% on 2L - O2 81-83% on RA   TEST/EVENTS : CT chest November 27, 2018 showed extensive chronic lung disease with a mosaic attenuation, bronchiectasis, central airway thickening and mucous plugging.  Greatest in the right middle lobe lingula  and right lower lobe.  Scattered peribronchial inflammatory changes greater than the right upper lobe.  Mild emphysema  Sputum culture 810/20-Pseudomonas Aeruginosa (pansensitive), AFB negative to date Blood cultures were negative  Assessment and Plan:  Bronchiectasis - Productive cough with purulent mucus x 1 month; associated pleuritic pain  - Increase hypertonic saline nebulizer twice daily  - Add mucinex 600mg  twice daily - Checking Labs and CXR today to rule out PNA  - Holding off on antibiotics until CXR resulted, if negative will treated for acute bronchiectasis exacerbation - Covid testing   Follow Up Instructions:  - Oct 22 at 2pm with Dr. Halford Chessman   I discussed the assessment and treatment plan with the patient. The patient was provided an opportunity to ask questions and all were answered. The patient agreed with the plan and demonstrated an understanding of the instructions.   The patient was advised to call back or seek an in-person evaluation if the symptoms worsen or if the condition fails to improve as anticipated.  I provided 22 minutes of non-face-to-face time during this encounter.   Martyn Ehrich, NP

## 2019-02-04 NOTE — Telephone Encounter (Signed)
Seen today for televisit for reports increase cough with green mucus x 1 month and new left back pain. Hx bronchiectasis with MAI. Recently hospitalized in August for PNA right upper lobe. Sputum culture positive for pseudomonasAeruginosa. Advised she increase hypertonic saline nebulizer toe twice a day, continue flutter valve and add mucinex.   Ordered CXR today, can you please check on results to ensure she does not have a pneumonia. If normal I will call her tomorrow and treat like bronchiectasis exacerbation.

## 2019-02-06 LAB — NOVEL CORONAVIRUS, NAA: SARS-CoV-2, NAA: NOT DETECTED

## 2019-02-08 ENCOUNTER — Telehealth: Payer: Self-pay | Admitting: Primary Care

## 2019-02-08 MED ORDER — IPRATROPIUM-ALBUTEROL 0.5-2.5 (3) MG/3ML IN SOLN: 3.0000 mL | Freq: Three times a day (TID) | RESPIRATORY_TRACT | 1 refills | Status: DC | PRN

## 2019-02-08 NOTE — Telephone Encounter (Signed)
Spoke with patient. She stated that [place] (an [employer] affiliate needs verification that she is on [employer]. Advised her that I could send her most recent OV, she verbalized understanding.   She also mentioned that Beth increased her DuoNeb to 2-3 times a day. She is requesting a RX to be sent to [facility] to make she receives enough medication. Advised her that I could take care of this for her as well.   Nothing further needed at time of call.

## 2019-02-09 ENCOUNTER — Telehealth: Payer: Self-pay | Admitting: General Surgery

## 2019-02-09 NOTE — Telephone Encounter (Signed)
I called the patient because of a fax received from [facility] asking for a copy of her progress notes relating to the prescription for Duoneb. I told the patient that the prescription we sent in was to [facility]. Patient agreed not to send her medical progress notes to [facility]. Nothing further needed at this time.

## 2019-02-11 ENCOUNTER — Encounter: Payer: Self-pay | Admitting: Pulmonary Disease

## 2019-02-11 ENCOUNTER — Ambulatory Visit (INDEPENDENT_AMBULATORY_CARE_PROVIDER_SITE_OTHER): Payer: Medicare Other | Admitting: Pulmonary Disease

## 2019-02-11 ENCOUNTER — Other Ambulatory Visit: Payer: Self-pay

## 2019-02-11 VITALS — BP 130/78 | HR 65 | Temp 97.8°F | Ht 67.0 in | Wt 132.6 lb

## 2019-02-11 DIAGNOSIS — J479 Bronchiectasis, uncomplicated: Secondary | ICD-10-CM

## 2019-02-11 DIAGNOSIS — J9611 Chronic respiratory failure with hypoxia: Secondary | ICD-10-CM

## 2019-02-11 NOTE — Progress Notes (Signed)
[name]  Chief Complaint  Patient presents with  . Bronchiectasis with acute exacerbation    Patient having difficulty getting Duoneb from [facility]. Prescription issued on 02/08/19. [facility] forwarded her prescription to [facility] who told her they need documentation to issue the prescription.    Constitutional:  BP 130/78 (BP Location: Left Arm, Patient Position: Sitting, Cuff Size: Normal)   Pulse 65   Temp 97.8 F (36.6 C)   Ht 5\' 7"  (1.702 m)   Wt 132 lb 9.6 oz (60.1 kg)   SpO2 97% Comment: on 2L pulse  BMI 20.77 kg/m   Past Medical History:  Hypothyroidism, HTN, OA  Brief Summary:  Stacy Sanchez is a 73 y.o. female dx with MAI in 2001 complicated by bronchiectasis.   She is here with her daughter.  Saw Dr. 2002 with ID last month.  Was taken off ABx.    Last week got pain in her Lt chest.  Had CXR and had increased congestion in RUL.  Eventually went to urgent care and prescribed prednisone and ABx.  Pain is better.  Has cough with green sputum.  Not having fever, hemoptysis, or wheeze.  Weight has been steady.  Using 1.5 liters oxygen.  Has pulse oximeter at home.  Hasn't been able to get duoneb set up yet.   Sputum culture from 11/30/18 grew Pseudomonas aeruginosa that was pan sensitive.  AFB was negative.  Physical Exam:   Appearance - well kempt, wearing oxygen  ENMT - clear nasal mucosa, midline nasal  septum, no oral exudates, no LAN, trachea midline  Respiratory - normal chest wall, normal respiratory effort, no accessory muscle use, crackles at Rt base that clear with coughing  CV - s1s2 regular rate and rhythm, no murmurs, no peripheral edema, radial pulses symmetric  GI - soft, non tender, no masses  Lymph - no adenopathy noted in neck and axillary areas  MSK - normal gait  Ext - no cyanosis, clubbing, or joint inflammation noted  Skin - no rashes, lesions, or ulcers  Neuro - normal  strength, oriented x 3  Psych - normal mood and affect   Assessment/Plan:   Bronchiectasis with hx of MAI. - continue mucinex, flutter valve, and hypertonic saline - waiting on duoneb to be filled >> this is required to help with bronchial hygiene and airway clearance - high dose flu shot today  Chronic respiratory failure with hypoxia. - continue 1.5 liters supplemental oxygen with exertion and sleep - goal SpO2 > 90%   Patient Instructions  High dose flu shot today  Follow up in 2 months  A total of  31 minutes were spent face to face with the patient and more than half of that time involved counseling or coordination of care.   01/30/19, MD [facility] Pager: [phone] 02/11/2019, 3:08 PM  Flow Sheet    Sleep tests:  CT angio chest 11/27/18 >> prominent adenopathy, mosaic attenuation, BTX, central airway thickening, mucus plugging, mild emphysema, density in RUL  Medications:   Allergies as of 02/11/2019      Reactions   Amikacin Other (See Comments)   nephropathy    Ethambutol Other (See Comments)   neuropathy   Levaquin [levofloxacin] Other (See Comments)   Fainting and weakness   Parafon Forte Dsc [chlorzoxazone] Hives   Rifabutin Diarrhea   Tequin [gatifloxacin] Other (See Comments)   Fainting and weakness   Zyvox [linezolid] Diarrhea  Medication List       Accurate as of February 11, 2019  3:08 PM. If you have any questions, ask your nurse or doctor.        acetaminophen 650 MG CR tablet Commonly known as: TYLENOL Take 650 mg by mouth every 8 (eight) hours as needed for pain.   amLODipine 5 MG tablet Commonly known as: NORVASC Take 5 mg by mouth daily.   aspirin EC 81 MG tablet Take 81 mg by mouth daily.   Compressor/Nebulizer Misc 1 Product by Does not apply route once for 1 dose.   escitalopram 5 MG tablet Commonly known as: LEXAPRO Take 5 mg by mouth daily.   gabapentin 300 MG capsule Commonly known  as: NEURONTIN Take 300 mg by mouth 2 (two) times daily.   guaiFENesin 600 MG 12 hr tablet Commonly known as: MUCINEX Take 2 tablets (1,200 mg total) by mouth 2 (two) times daily.   HYDROcodone-acetaminophen 5-325 MG tablet Commonly known as: NORCO/VICODIN Take 0.5 tablets by mouth every 6 (six) hours as needed for moderate pain.   ipratropium-albuterol 0.5-2.5 (3) MG/3ML Soln Commonly known as: DUONEB Take 3 mLs by nebulization 3 (three) times daily as needed.   levothyroxine 50 MCG tablet Commonly known as: SYNTHROID Take 50 mcg by mouth daily before breakfast. Take 1 tablet by mouth daily except for Saturday and Sunday taking 2 tablets (168mcg)   multivitamin with minerals Tabs tablet Take 1 tablet by mouth daily.   nadolol 20 MG tablet Commonly known as: CORGARD Take 10 mg by mouth 2 (two) times daily.   polyethylene glycol powder 17 GM/SCOOP powder Commonly known as: GLYCOLAX/MIRALAX Take 17 g by mouth daily.   rosuvastatin 10 MG tablet Commonly known as: CRESTOR Take 10 mg by mouth daily.   senna 8.6 MG Tabs tablet Commonly known as: SENOKOT Take 1 tablet by mouth daily as needed for mild constipation.   sodium chloride HYPERTONIC 3 % nebulizer solution Take 4 mLs by nebulization daily.       Past Surgical History:  She  has a past surgical history that includes Cardiac surgery; Abdominal hysterectomy; Tonsillectomy (1956); Bronchoscopy (2001); Colonoscopy (2008); Cyst removal trunk (1965); Dilation and curettage of uterus (1975); Hand surgery (Right, 1983); Patent ductus arterious repair (1952); and Varicose vein surgery (1972).  Family History:  Her family history includes Thyroid disease in her daughter.  Social History:  She  reports that she has never smoked. She has never used smokeless tobacco. She reports that she does not drink alcohol or use drugs.

## 2019-02-11 NOTE — Patient Instructions (Signed)
High dose flu shot today  Follow up in 2 months

## 2019-03-01 ENCOUNTER — Telehealth: Payer: Self-pay

## 2019-03-01 NOTE — Telephone Encounter (Signed)
COVID-19 Pre-Screening Questions:03/01/19   Do you currently have a fever (>100 F), chills or unexplained body aches?NO  Are you currently experiencing new cough, shortness of breath, sore throat, runny nose? N O .  Have you recently travelled outside the state of [place] in the last 14 days? NO .  Have you been in contact with someone that is currently pending confirmation of Covid19 testing or has been confirmed to have the Covid19 virus?  NO  **If the patient answers NO to ALL questions -  advise the patient to please call the clinic before coming to the office should any symptoms develop.     

## 2019-03-02 ENCOUNTER — Ambulatory Visit (INDEPENDENT_AMBULATORY_CARE_PROVIDER_SITE_OTHER): Payer: Medicare Other | Admitting: Internal Medicine

## 2019-03-02 ENCOUNTER — Other Ambulatory Visit: Payer: Self-pay

## 2019-03-02 ENCOUNTER — Encounter: Payer: Self-pay | Admitting: Internal Medicine

## 2019-03-02 VITALS — BP 167/94 | HR 73 | Wt 133.0 lb

## 2019-03-02 DIAGNOSIS — J479 Bronchiectasis, uncomplicated: Secondary | ICD-10-CM

## 2019-03-02 DIAGNOSIS — A310 Pulmonary mycobacterial infection: Secondary | ICD-10-CM | POA: Diagnosis present

## 2019-03-02 NOTE — Progress Notes (Signed)
RFV: follow up for bronchiectasis  Patient ID: Stacy Sanchez, female   DOB: 01-Mar-1946, 73 y.o.   MRN: [id number]  HPI Stacy Sanchez is a 73yo F with bronchiectasis as well as hx mac colonization. She reports that she is at her baseline cough, no increase oxygen needs above her baseline at this time.sputum is clear. She denies worsening fatigue. Weight is stable as well.  Outpatient Encounter Medications as of 03/02/2019  Medication Sig  . acetaminophen (TYLENOL) 650 MG CR tablet Take 650 mg by mouth every 8 (eight) hours as needed for pain.  Marland Kitchen amLODipine (NORVASC) 5 MG tablet Take 5 mg by mouth daily.   Marland Kitchen aspirin EC 81 MG tablet Take 81 mg by mouth daily.  Marland Kitchen escitalopram (LEXAPRO) 5 MG tablet Take 5 mg by mouth daily.  Marland Kitchen gabapentin (NEURONTIN) 300 MG capsule Take 300 mg by mouth 2 (two) times daily.  Marland Kitchen guaiFENesin (MUCINEX) 600 MG 12 hr tablet Take 2 tablets (1,200 mg total) by mouth 2 (two) times daily.  Marland Kitchen HYDROcodone-acetaminophen (NORCO/VICODIN) 5-325 MG tablet Take 0.5 tablets by mouth every 6 (six) hours as needed for moderate pain.  Marland Kitchen ipratropium-albuterol (DUONEB) 0.5-2.5 (3) MG/3ML SOLN Take 3 mLs by nebulization 3 (three) times daily as needed.  Marland Kitchen levothyroxine (SYNTHROID, LEVOTHROID) 50 MCG tablet Take 50 mcg by mouth daily before breakfast. Take 1 tablet by mouth daily except for Saturday and Sunday taking 2 tablets (153mg)  . Multiple Vitamin (MULTIVITAMIN WITH MINERALS) TABS tablet Take 1 tablet by mouth daily.  . nadolol (CORGARD) 20 MG tablet Take 10 mg by mouth 2 (two) times daily.   . polyethylene glycol powder (GLYCOLAX/MIRALAX) 17 GM/SCOOP powder Take 17 g by mouth daily.  . rosuvastatin (CRESTOR) 10 MG tablet Take 10 mg by mouth daily.  .Marland Kitchensenna (SENOKOT) 8.6 MG TABS tablet Take 1 tablet by mouth daily as needed for mild constipation.  . sodium chloride HYPERTONIC 3 % nebulizer solution Take 4 mLs by nebulization daily.  . Nebulizers (COMPRESSOR/NEBULIZER) MISC 1 Product by  Does not apply route once for 1 dose.   No facility-administered encounter medications on file as of 03/02/2019.      Patient Active Problem List   Diagnosis Date Noted  . Chronic respiratory failure with hypoxia ([place] 12/07/2018  . Acute respiratory failure with hypoxia ([place] 11/27/2018  . Bronchiectasis with acute exacerbation ([place] 11/27/2018  . 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 Thrush 11/27/2018  . CAD (coronary artery disease) 11/27/2018  . CKD (chronic kidney disease), stage III 11/27/2018  . Essential hypertension 11/27/2018  . Generalized anxiety disorder 11/27/2018  . Peripheral neuropathy 11/27/2018  . 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 Hepatitis C Screening  012-29-1947 . COLONOSCOPY  07/20/1993  . DEXA SCAN  07/20/2008  . INFLUENZA VACCINE  11/21/2018     Review of Systems 12 point ros is negative other than what is mentioned in hpi Physical Exam   BP (!) 167/94   Pulse 73   Wt 133 lb (60.3 kg)   BMI 20.83 kg/m   Physical Exam  Constitutional:  oriented to person, place, and time. appears well-developed and well-nourished. No distress.  HENT: [place]/AT, PERRLA, no scleral icterus Mouth/Throat: Oropharynx is clear and moist. No oropharyngeal exudate.  Cardiovascular: Normal rate, regular rhythm and normal heart sounds. Exam reveals no gallop and no friction rub.  No murmur heard.  Pulmonary/Chest: Effort normal and breath sounds normal. No  respiratory distress.  has no wheezes.  Neck = supple, no nuchal rigidity Abdominal: Soft. Bowel sounds are normal.  exhibits no distension. There is no tenderness.  Lymphadenopathy: no cervical adenopathy. No axillary adenopathy Neurological: alert and oriented to person, place, and time.  Skin: Skin is warm and dry. No rash noted. No erythema.  Psychiatric: a normal mood and affect.  behavior is normal.    CBC Lab Results  Component Value Date   WBC  7.1 02/04/2019   RBC 3.83 (L) 02/04/2019   HGB 11.6 (L) 02/04/2019   HCT 35.9 (L) 02/04/2019   PLT 181.0 02/04/2019   MCV 93.8 02/04/2019   MCH 30.8 12/01/2018   MCHC 32.3 02/04/2019   RDW 15.4 02/04/2019   LYMPHSABS 1.5 02/04/2019   MONOABS 0.7 02/04/2019   EOSABS 0.2 02/04/2019    BMET Lab Results  Component Value Date   NA 138 02/04/2019   K 4.7 02/04/2019   CL 98 02/04/2019   CO2 37 (H) 02/04/2019   GLUCOSE 73 02/04/2019   BUN 18 02/04/2019   CREATININE 0.97 02/04/2019   CALCIUM 9.2 02/04/2019   GFRNONAA 59 (L) 12/01/2018   GFRAA >60 12/01/2018      Assessment and Plan  Bronchiectasis = pulmonary hygiene practices to  Continue. Will give sputum cup to drop off at next visit. 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 At this time, has productive cough but reports she is at her baseline. If reculturing mac, may consider initiation of abtx  Continues with 1.5L by [place]  Hypertension = at this visit elevated, recommend to follow up with pcp  Vaccines up to date  rtc in 3 months

## 2019-03-10 ENCOUNTER — Telehealth: Payer: Self-pay | Admitting: Pulmonary Disease

## 2019-03-10 NOTE — Telephone Encounter (Signed)
Call returned to [place], confirmed pt DOB, I made her aware we have not received any paper work. She is going to re-fax.   Nothing further needed at this time.

## 2019-03-10 NOTE — Telephone Encounter (Signed)
Noted. Will route message to nurse to make aware.    

## 2019-03-11 NOTE — Telephone Encounter (Signed)
Confirmed form has been received by Dr. Halford Chessman. Waiting on signature and will be faxed once completed.

## 2019-03-12 NOTE — Telephone Encounter (Signed)
[facility] is calling back they said they need that form pt having surgery Monday need asap Fax [phone]

## 2019-03-12 NOTE — Telephone Encounter (Signed)
Spoke with Hinton Dyer who advised that Burman Nieves has been working with/handling Dr. Halford Chessman and paperwork this week.  Burman Nieves verified that she has this completed form and will fax it now. [facility] and spoke with Colletta [place] to make her aware.  Nothing further needed at this time- will close encounter.

## 2019-04-08 ENCOUNTER — Other Ambulatory Visit: Payer: Self-pay

## 2019-04-08 ENCOUNTER — Ambulatory Visit (INDEPENDENT_AMBULATORY_CARE_PROVIDER_SITE_OTHER): Payer: Medicare Other | Admitting: Pulmonary Disease

## 2019-04-08 ENCOUNTER — Encounter: Payer: Self-pay | Admitting: Pulmonary Disease

## 2019-04-08 VITALS — BP 140/80 | HR 55 | Temp 97.2°F | Ht 67.0 in | Wt 130.8 lb

## 2019-04-08 DIAGNOSIS — J479 Bronchiectasis, uncomplicated: Secondary | ICD-10-CM

## 2019-04-08 DIAGNOSIS — J9611 Chronic respiratory failure with hypoxia: Secondary | ICD-10-CM

## 2019-04-08 NOTE — Patient Instructions (Signed)
Follow up in 6 months 

## 2019-04-08 NOTE — Progress Notes (Signed)
[facility]  Chief Complaint  Patient presents with  . Follow-up    Patient is here for follow up. Patient is asking about getting off oxygen. Patient states that she has been able to go 4-5 hours without wearing it but also checks her pulse ox and ranges anywhere between 92-89    Constitutional:  BP 140/80 (BP Location: Right Arm, Patient Position: Sitting, Cuff Size: Normal)   Pulse (!) 55   Temp (!) 97.2 F (36.2 C) (Temporal)   Ht 5\' 7"  (1.702 m)   Wt 130 lb 12.8 oz (59.3 kg)   SpO2 91% Comment: on 2L  BMI 20.49 kg/m   Past Medical History:  Hypothyroidism, HTN, OA, CKD 3a, HLD, Depression, Anxiety, CAD  Brief Summary:  Stacy Sanchez is a 73 y.o. 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   She is here with her daughter.  She had cataract surgery w/o difficulty.  She is using 1.5 liters oxygen with sleep and intermittently during the day.  She feels more energy doing activities when she uses her oxygen.  She has pulse oximeter at home, and levels can dip into mid 80's w/o oxygen during activity.  She has daily cough with sputum.  Phlegm is clear to green.  Amount is consistent on daily basis and comes up easily.  Not having fever, chest pain, or hemoptysis.  Weight has been steady.  Not having sinus congestion or sore throat.  Had questions regarding corona virus vaccine, and what to tell family about visiting for the holidays.   Physical Exam:   Appearance - wearing oxygen  ENMT - no sinus tenderness, no nasal discharge, no oral exudate  Neck - no masses, trachea midline, no thyromegaly, no elevation in JVP  Respiratory - normal appearance of chest wall, normal respiratory effort w/o accessory muscle use, no dullness on percussion, no wheezing or rales  CV - s1s2 regular rate and rhythm, no murmurs, no peripheral edema, radial pulses symmetric  GI - soft, non tender  Lymph - no adenopathy noted in neck and  axillary areas  MSK - normal gait  Ext - no cyanosis, clubbing, or joint inflammation noted  Skin - no rashes, lesions, or ulcers  Neuro - normal strength, oriented x 3  Psych - normal mood and affect   Assessment/Plan:   Bronchiectasis with hx of MAI. - seems to be at baseline and stable - continue mucinex, flutter valve - hypertonic saline nebulized bid prn; plan to send 3 month supply with next refill - duoneb q6h prn - discussed indications for when she should use duoneb and hypertonic saline - she has follow up with Dr. Baxter Flattery in January and will be getting sputum culture done prior to this visit  Chronic respiratory failure with hypoxia. - discussed rationale for continuing supplemental oxygen - continue 1.5 liters with exertion and sleep - goal SpO2 > 90%  Advice about COVID-19. - discussed pros/cons of COVID 19 vaccine, and known side effect profile - advised to avoid unnecessary contact with family members during the holidays if possible to avoid risk of infection from asymptomatic carriers   Patient Instructions  Follow up in 6 months   Chesley Mires, MD [place] Pager: [phone] 04/08/2019, 5:08 PM  Flow Sheet      Chest imaging:  CT angio chest 11/27/18 >> prominent adenopathy, mosaic attenuation, BTX, central airway thickening, mucus plugging, mild emphysema, density in RUL  Micro data:  Sputum 11/30/18 >>  Pseudomonas aeruginosa, pan-S  Medications:   Allergies as of 04/08/2019      Reactions   Amikacin Other (See Comments)   nephropathy    Ethambutol Other (See Comments)   neuropathy   Levaquin [levofloxacin] Other (See Comments)   Fainting and weakness   Parafon Forte Dsc [chlorzoxazone] Hives   Rifabutin Diarrhea   Tequin [gatifloxacin] Other (See Comments)   Fainting and weakness   Zyvox [linezolid] Diarrhea      Medication List       Accurate as of April 08, 2019  5:08 PM. If you have any questions,  ask your nurse or doctor.        acetaminophen 650 MG CR tablet Commonly known as: TYLENOL Take 650 mg by mouth every 8 (eight) hours as needed for pain.   amLODipine 5 MG tablet Commonly known as: NORVASC Take 5 mg by mouth daily.   aspirin EC 81 MG tablet Take 81 mg by mouth daily.   Compressor/Nebulizer Misc 1 Product by Does not apply route once for 1 dose.   escitalopram 5 MG tablet Commonly known as: LEXAPRO Take 5 mg by mouth daily.   gabapentin 300 MG capsule Commonly known as: NEURONTIN Take 300 mg by mouth 2 (two) times daily.   guaiFENesin 600 MG 12 hr tablet Commonly known as: MUCINEX Take 2 tablets (1,200 mg total) by mouth 2 (two) times daily.   HYDROcodone-acetaminophen 5-325 MG tablet Commonly known as: NORCO/VICODIN Take 0.5 tablets by mouth every 6 (six) hours as needed for moderate pain.   ipratropium-albuterol 0.5-2.5 (3) MG/3ML Soln Commonly known as: DUONEB Take 3 mLs by nebulization 3 (three) times daily as needed.   levothyroxine 50 MCG tablet Commonly known as: SYNTHROID Take 50 mcg by mouth daily before breakfast. Take 1 tablet by mouth daily except for Saturday and Sunday taking 2 tablets ( )   multivitamin with minerals Tabs tablet Take 1 tablet by mouth daily.   nadolol 20 MG tablet Commonly known as: CORGARD Take 10 mg by mouth 2 (two) times daily.   polyethylene glycol powder 17 GM/SCOOP powder Commonly known as: GLYCOLAX/MIRALAX Take 17 g by mouth daily.   rosuvastatin 10 MG tablet Commonly known as: CRESTOR Take 10 mg by mouth daily.   senna 8.6 MG Tabs tablet Commonly known as: SENOKOT Take 1 tablet by mouth daily as needed for mild constipation.   sodium chloride HYPERTONIC 3 % nebulizer solution Take 4 mLs by nebulization daily.       Past Surgical History:  She  has a past surgical history that includes Cardiac surgery; Abdominal hysterectomy; Tonsillectomy (1956); Bronchoscopy (2001); Colonoscopy (2008);  Cyst removal trunk (1965); Dilation and curettage of uterus (1975); Hand surgery (Right, 1983); Patent ductus arterious repair (1952); and Varicose vein surgery (1972).  Family History:  Her family history includes Thyroid disease in her daughter.  Social History:  She  reports that she has never smoked. She has never used smokeless tobacco. She reports that she does not drink alcohol or use drugs.

## 2019-06-02 ENCOUNTER — Ambulatory Visit: Payer: 59 | Admitting: Internal Medicine

## 2019-06-09 ENCOUNTER — Other Ambulatory Visit: Payer: Self-pay

## 2019-06-09 ENCOUNTER — Ambulatory Visit (INDEPENDENT_AMBULATORY_CARE_PROVIDER_SITE_OTHER): Payer: Medicare HMO | Admitting: Internal Medicine

## 2019-06-09 ENCOUNTER — Encounter: Payer: Self-pay | Admitting: Internal Medicine

## 2019-06-09 VITALS — BP 158/96 | HR 69 | Wt 126.0 lb

## 2019-06-09 DIAGNOSIS — J479 Bronchiectasis, uncomplicated: Secondary | ICD-10-CM | POA: Diagnosis not present

## 2019-06-09 DIAGNOSIS — A310 Pulmonary mycobacterial infection: Secondary | ICD-10-CM | POA: Diagnosis not present

## 2019-06-09 NOTE — Progress Notes (Signed)
Rfv: follow up for pulmonary mac  Patient ID: Stacy Sanchez, female   DOB: [birth year], 74 y.o.   MRN: [id number]  HPI 74yo F with bronchiectasis and pulmonary mac. She has Brought in phlegm specimen. She reports that she has noticed slight worsening shortness of breath, especially with small amount of exertion. No fever, chills, nightsweats.  Outpatient Encounter Medications as of 06/09/2019  Medication Sig  . acetaminophen (TYLENOL) 650 MG CR tablet Take 650 mg by mouth every 8 (eight) hours as needed for pain.  Marland Kitchen aspirin EC 81 MG tablet Take 81 mg by mouth daily.  Marland Kitchen escitalopram (LEXAPRO) 10 MG tablet Take 10 mg by mouth daily.  Marland Kitchen gabapentin (NEURONTIN) 300 MG capsule Take 300 mg by mouth 2 (two) times daily.  Marland Kitchen ipratropium-albuterol (DUONEB) 0.5-2.5 (3) MG/3ML SOLN Take 3 mLs by nebulization 3 (three) times daily as needed.  Marland Kitchen levothyroxine (SYNTHROID, LEVOTHROID) 50 MCG tablet Take 50 mcg by mouth daily before breakfast. Take 1 tablet by mouth daily except for Saturday and Sunday taking 2 tablets (120mg)  . Multiple Vitamin (MULTIVITAMIN WITH MINERALS) TABS tablet Take 1 tablet by mouth daily.  . nadolol (CORGARD) 20 MG tablet Take 10 mg by mouth 2 (two) times daily.   . rosuvastatin (CRESTOR) 10 MG tablet Take 10 mg by mouth daily.  .Marland KitchenamLODipine (NORVASC) 5 MG tablet Take 5 mg by mouth daily.   . cefdinir (OMNICEF) 300 MG capsule   . escitalopram (LEXAPRO) 5 MG tablet Take 5 mg by mouth daily.  .Marland KitchenguaiFENesin (MUCINEX) 600 MG 12 hr tablet Take 2 tablets (1,200 mg total) by mouth 2 (two) times daily. (Patient not taking: Reported on 04/08/2019)  . HYDROcodone-acetaminophen (NORCO/VICODIN) 5-325 MG tablet Take 0.5 tablets by mouth every 6 (six) hours as needed for moderate pain.  . Nebulizers (COMPRESSOR/NEBULIZER) MISC 1 Product by Does not apply route once for 1 dose.  . polyethylene glycol powder (GLYCOLAX/MIRALAX) 17 GM/SCOOP powder Take 17 g by mouth daily.  . predniSONE  (DELTASONE) 20 MG tablet Take 20 mg by mouth 2 (two) times daily.  .Marland Kitchensenna (SENOKOT) 8.6 MG TABS tablet Take 1 tablet by mouth daily as needed for mild constipation.  . sodium chloride HYPERTONIC 3 % nebulizer solution Take 4 mLs by nebulization daily. (Patient not taking: Reported on 06/09/2019)  . ZYLET 0.5-0.3 % SUSP Place 1 drop into the left eye 4 (four) times daily.   No facility-administered encounter medications on file as of 06/09/2019.     Patient Active Problem List   Diagnosis Date Noted  . Chronic respiratory failure with hypoxia ([facility] 12/07/2018  . Acute respiratory failure with hypoxia ([facility] 11/27/2018  . Bronchiectasis with acute exacerbation ([facility] 11/27/2018  . 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 Thrush 11/27/2018  . CAD (coronary artery disease) 11/27/2018  . CKD (chronic kidney disease), stage III 11/27/2018  . Essential hypertension 11/27/2018  . Generalized anxiety disorder 11/27/2018  . Peripheral neuropathy 11/27/2018  . Mycobacterium avium complex ([facility] 11/27/2018     Health Maintenance Due  Topic Date Due  . Hepatitis C Screening  [birth year] . COLONOSCOPY  07/20/1993  . DEXA SCAN  07/20/2008     Review of Systems 12 point ros is negative except what is mentioned above Physical Exam   BP (!) 158/96   Pulse 69   Wt 126 lb (57.2 kg)   BMI 19.73 kg/m    Thin frame, gen = a xo by 3 heent = no signs of thrush  pulm = bilateral rhonchi,  CBC Lab Results  Component Value Date   WBC 7.1 02/04/2019   RBC 3.83 (L) 02/04/2019   HGB 11.6 (L) 02/04/2019   HCT 35.9 (L) 02/04/2019   PLT 181.0 02/04/2019   MCV 93.8 02/04/2019   MCH 30.8 12/01/2018   MCHC 32.3 02/04/2019   RDW 15.4 02/04/2019   LYMPHSABS 1.5 02/04/2019   MONOABS 0.7 02/04/2019   EOSABS 0.2 02/04/2019    BMET Lab Results  Component Value Date   NA 138 02/04/2019   K 4.7 02/04/2019   CL 98 02/04/2019   CO2 37 (H) 02/04/2019   GLUCOSE 73 02/04/2019   BUN 18 02/04/2019    CREATININE 0.97 02/04/2019   CALCIUM 9.2 02/04/2019   GFRNONAA 59 (L) 12/01/2018   GFRAA >60 12/01/2018      Assessment and Plan  Bronchiectasis flare  Brought in pulmonary phlegm specimen- always green, will send specimen to culture Pulmonary hygiene twice a day 1 in the morning and one at night mucinex liquid  Addendum = has ongoing psa isolated in culture. Will discuss if patient tolerates cipro (though doesn't tolerate other) if not, will do 2 wk of iv abtx

## 2019-06-09 NOTE — Patient Instructions (Signed)
Call me on Wednesday [phone] - cell for Dr Drue Second

## 2019-06-10 LAB — BASIC METABOLIC PANEL
BUN/Creatinine Ratio: 23 (calc) — ABNORMAL HIGH (ref 6–22)
BUN: 30 mg/dL — ABNORMAL HIGH (ref 7–25)
CO2: 32 mmol/L (ref 20–32)
Calcium: 9.3 mg/dL (ref 8.6–10.4)
Chloride: 101 mmol/L (ref 98–110)
Creat: 1.29 mg/dL — ABNORMAL HIGH (ref 0.60–0.93)
Glucose, Bld: 79 mg/dL (ref 65–99)
Potassium: 5 mmol/L (ref 3.5–5.3)
Sodium: 140 mmol/L (ref 135–146)

## 2019-06-10 LAB — CBC WITH DIFFERENTIAL/PLATELET
Absolute Monocytes: 621 cells/uL (ref 200–950)
Basophils Absolute: 38 cells/uL (ref 0–200)
Basophils Relative: 0.6 %
Eosinophils Absolute: 173 cells/uL (ref 15–500)
Eosinophils Relative: 2.7 %
HCT: 39.5 % (ref 35.0–45.0)
Hemoglobin: 13.1 g/dL (ref 11.7–15.5)
Lymphs Abs: 1658 cells/uL (ref 850–3900)
MCH: 30.1 pg (ref 27.0–33.0)
MCHC: 33.2 g/dL (ref 32.0–36.0)
MCV: 90.8 fL (ref 80.0–100.0)
MPV: 10.4 fL (ref 7.5–12.5)
Monocytes Relative: 9.7 %
Neutro Abs: 3910 cells/uL (ref 1500–7800)
Neutrophils Relative %: 61.1 %
Platelets: 171 10*3/uL (ref 140–400)
RBC: 4.35 10*6/uL (ref 3.80–5.10)
RDW: 14 % (ref 11.0–15.0)
Total Lymphocyte: 25.9 %
WBC: 6.4 10*3/uL (ref 3.8–10.8)

## 2019-06-16 ENCOUNTER — Telehealth: Payer: Self-pay

## 2019-06-16 NOTE — Telephone Encounter (Signed)
Received verbal orders for PICC placement + antibiotic treatment for patient from Dr. Drue Second. Dosing reviewed with Cassie, clinic pharmacist. Orders faxed to short stay for first dose and [facility] for home infusions. Cefipime 2grams Q12H, weekly CBC + BMET for 14 days.    Awaiting call back from IR for PICC placement date.   Britney Captain Loyola Mast, RN

## 2019-06-17 ENCOUNTER — Other Ambulatory Visit (HOSPITAL_COMMUNITY): Payer: Self-pay | Admitting: Internal Medicine

## 2019-06-17 DIAGNOSIS — A498 Other bacterial infections of unspecified site: Secondary | ICD-10-CM

## 2019-06-17 NOTE — Telephone Encounter (Signed)
Patient scheduled for PICC placement 06/21/19 @ 12pm. Patient to receive first dose in short stay following placement. Orders faxed to [facility] and short stay. Patient notified of appointments.   Mayumi Summerson Loyola Mast, RN

## 2019-06-18 ENCOUNTER — Other Ambulatory Visit (HOSPITAL_COMMUNITY): Payer: Self-pay | Admitting: *Deleted

## 2019-06-21 ENCOUNTER — Other Ambulatory Visit (HOSPITAL_COMMUNITY): Payer: Self-pay | Admitting: Internal Medicine

## 2019-06-21 ENCOUNTER — Ambulatory Visit (HOSPITAL_COMMUNITY)
Admission: RE | Admit: 2019-06-21 | Discharge: 2019-06-21 | Disposition: A | Discharge status: Home / Self Care | Payer: Medicare HMO | Source: Ambulatory Visit | Attending: Internal Medicine | Admitting: Internal Medicine

## 2019-06-21 ENCOUNTER — Ambulatory Visit
Admission: RE | Admit: 2019-06-21 | Discharge: 2019-06-21 | Disposition: A | Discharge status: Home / Self Care | Payer: 59 | Source: Ambulatory Visit | Attending: Internal Medicine | Admitting: Internal Medicine

## 2019-06-21 ENCOUNTER — Other Ambulatory Visit: Payer: Self-pay

## 2019-06-21 ENCOUNTER — Encounter (HOSPITAL_COMMUNITY)
Admission: RE | Admit: 2019-06-21 | Discharge: 2019-06-21 | Disposition: A | Discharge status: Home / Self Care | Payer: Managed Care, Other (non HMO) | Source: Ambulatory Visit | Attending: Internal Medicine | Admitting: Internal Medicine

## 2019-06-21 DIAGNOSIS — A498 Other bacterial infections of unspecified site: Secondary | ICD-10-CM

## 2019-06-21 DIAGNOSIS — A310 Pulmonary mycobacterial infection: Secondary | ICD-10-CM

## 2019-06-21 IMAGING — XA IR PICC >5YO
2 series · 2 of 2 positions shown · non-contrast
Comparison: none

INDICATION: 73-year-old with Pseudomonas infection. PICC line requested for IV
antibiotics.

[Series 1: single · 1 of 1 slices shown]
[im 1/1]
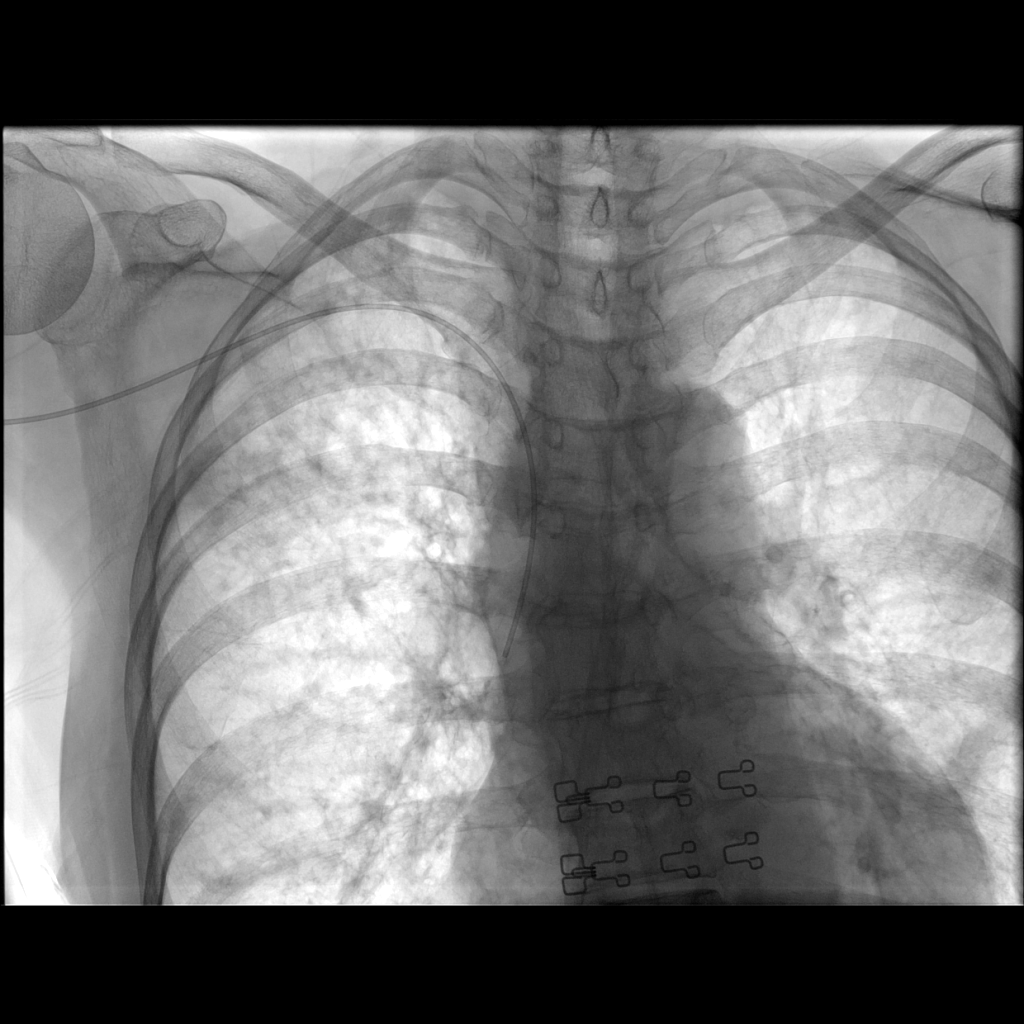

[Series 1: ir picc >5yo · 1 of 1 slices shown]
[im 1/1]
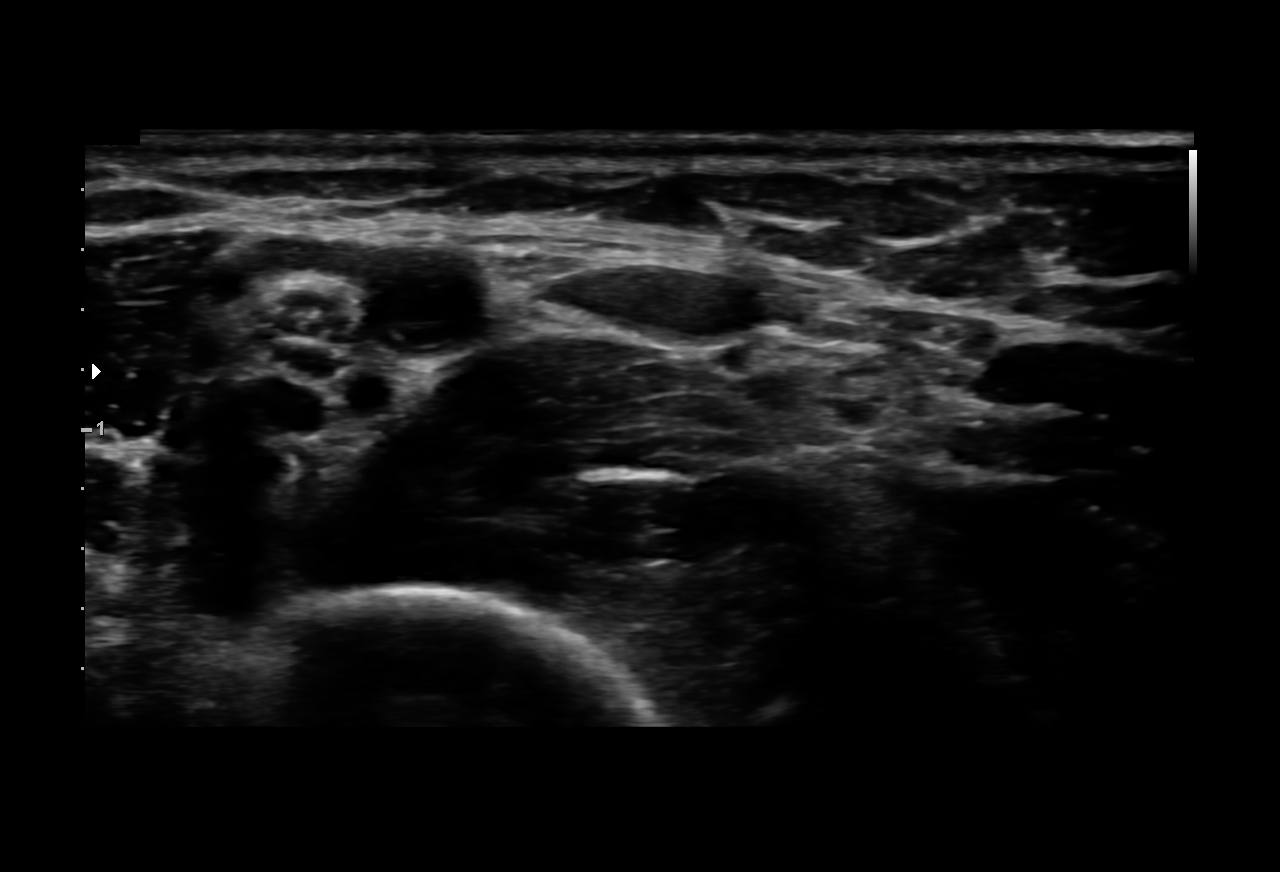

[2 of 2 positions shown; findings below may reference images not displayed]

EXAM:
PICC LINE PLACEMENT WITH ULTRASOUND AND FLUOROSCOPIC GUIDANCE

MEDICATIONS:
None

ANESTHESIA/SEDATION:
None

FLUOROSCOPY TIME:  Fluoroscopy Time: 48 seconds, 1 mGy

COMPLICATIONS:
None immediate.

PROCEDURE:
The patient was advised of the possible risks and complications and
agreed to undergo the procedure. The patient was then brought to the
angiographic suite for the procedure.

The right arm was prepped with chlorhexidine, draped in the usual
sterile fashion using maximum barrier technique (cap and mask,
sterile gown, sterile gloves, large sterile sheet, hand hygiene and
cutaneous antiseptic). Local anesthesia was attained by infiltration
with 1% lidocaine.

Ultrasound demonstrated patency of the right brachial vein, and this
was documented with an image. Under real-time ultrasound guidance,
this vein was accessed with a 21 gauge micropuncture needle and
image documentation was performed. The needle was exchanged over a
guidewire for a peel-away sheath through which a 36 cm 5 French
single lumen power injectable PICC was advanced, and positioned with
its tip in the lower SVC. Fluoroscopy during the procedure and
fluoro spot radiograph confirms appropriate catheter position. The
catheter was flushed, capped, secured to the skin, and covered with
a sterile dressing.
IMPRESSION: Successful placement of a right arm PICC with sonographic and
fluoroscopic guidance. The catheter is ready for use.

## 2019-06-21 IMAGING — CT CT CHEST W/O CM
2 of 4 series · 12 of 36 positions shown, 15 images · non-contrast
Comparison: [DATE]

CLINICAL DATA: Bronchiectasis. Progressive productive cough
concerning for worsening infection.

EXAM:
CT CHEST WITHOUT CONTRAST
TECHNIQUE: Multidetector CT imaging of the chest was performed following the
standard protocol without IV contrast.

[Series 2: chest 2.00 br40 s3 · axial · 0.51mm/px · z∈[+1503,+1767]mm · 9 of 157 slices shown, 12 images (1 of 2)]
[im 13/157  mediastinal]
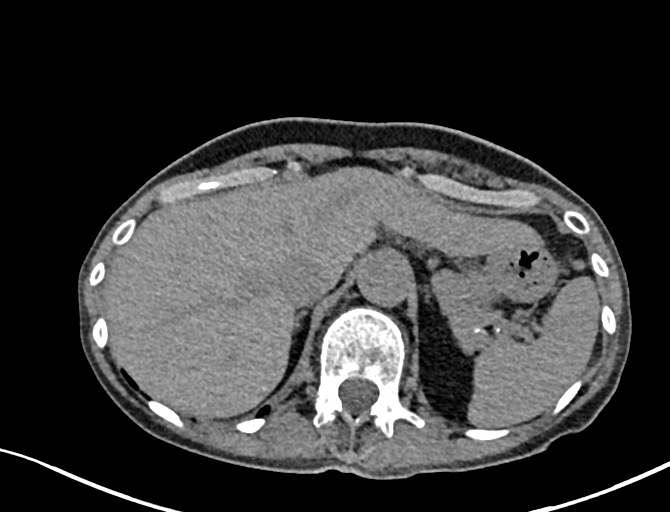
[im 13/157  lung]
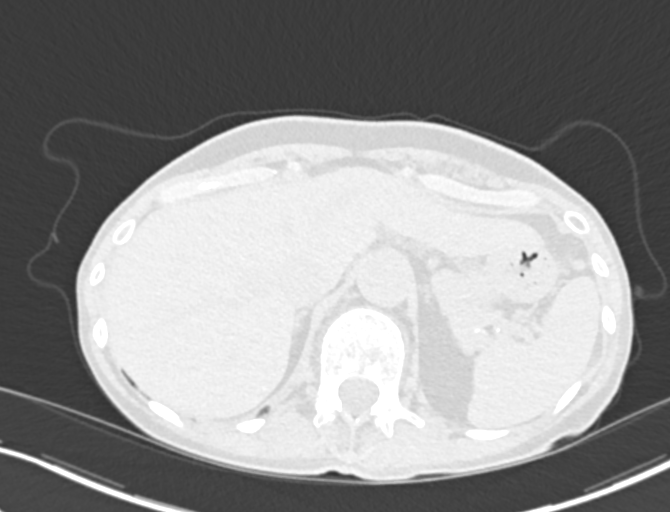
[im 37/157  lung]
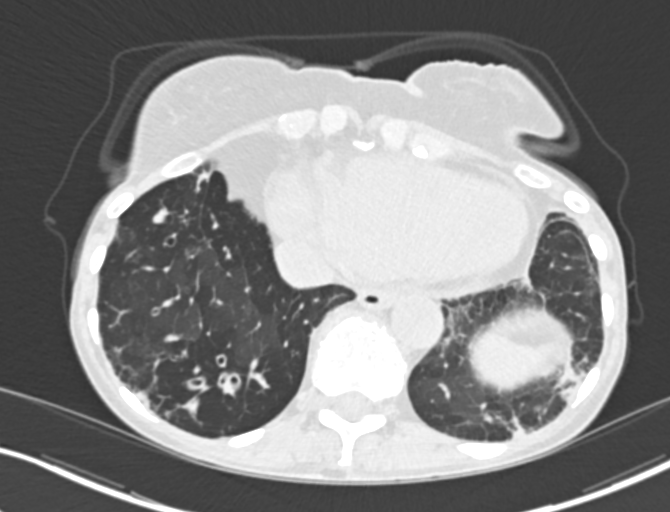
[im 49/157  lung]
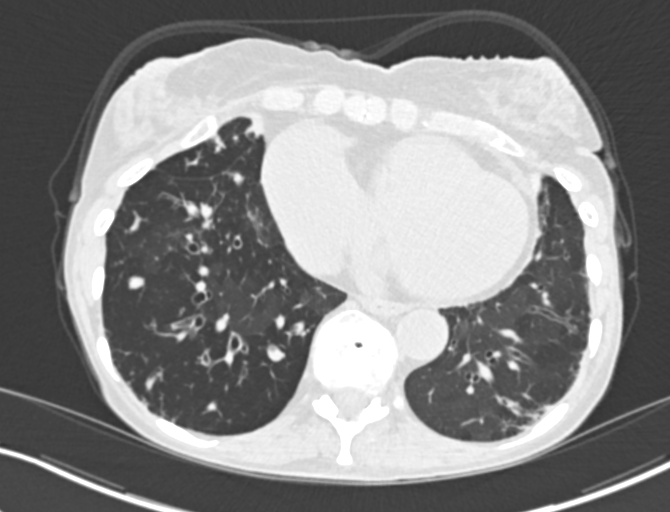
[im 61/157  lung]
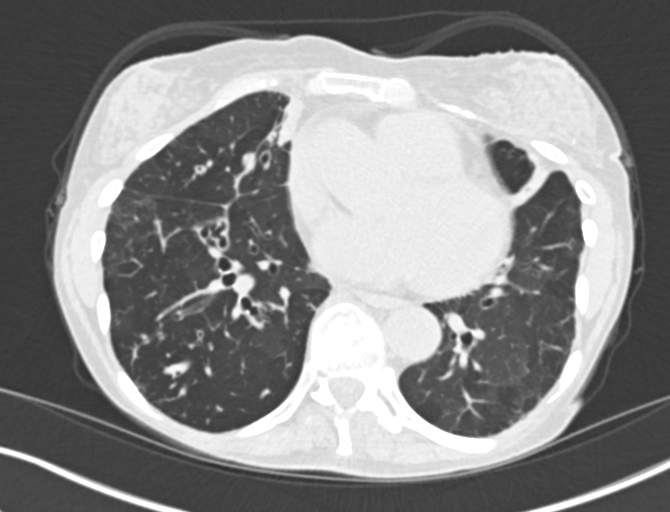
[im 85/157  mediastinal]
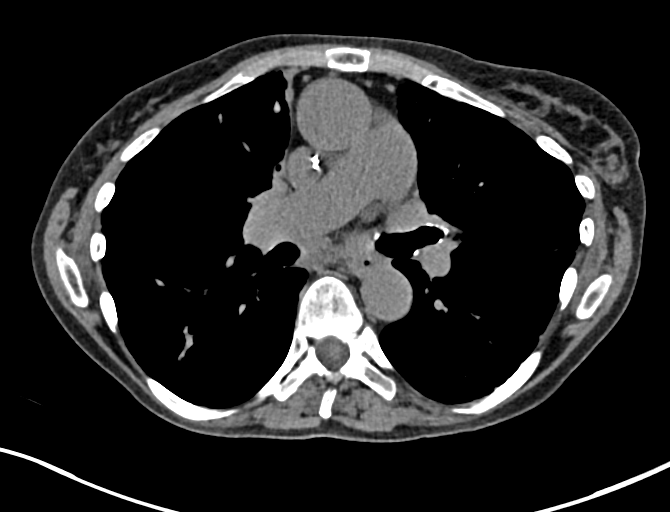
[im 85/157  lung]
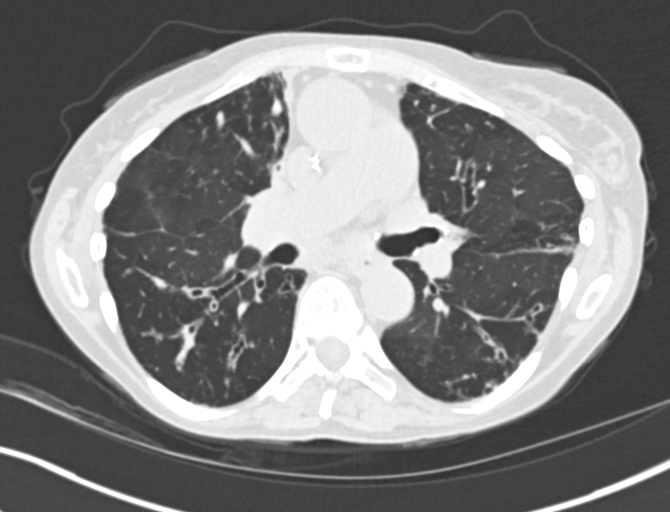
[im 97/157  lung]
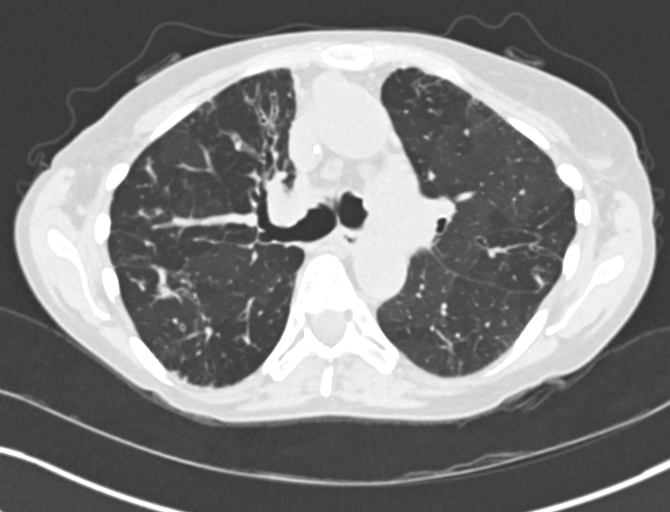
[im 109/157  lung]
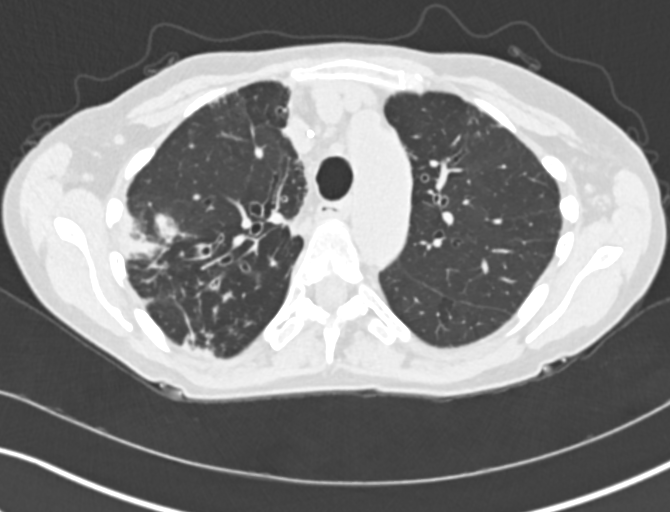
[im 133/157  lung]
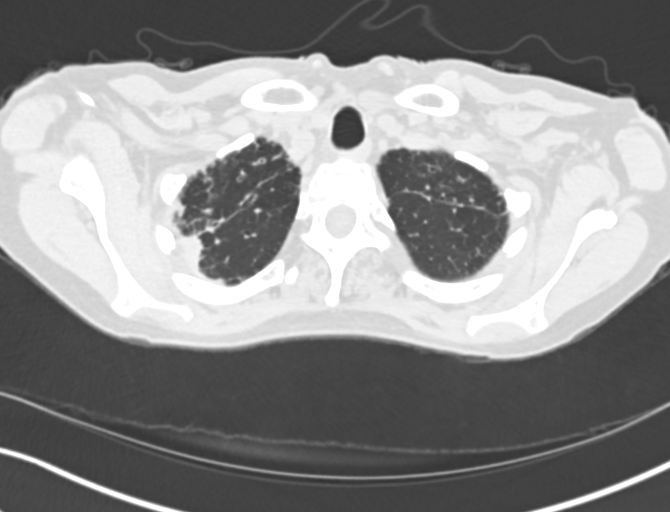
[im 145/157  mediastinal]
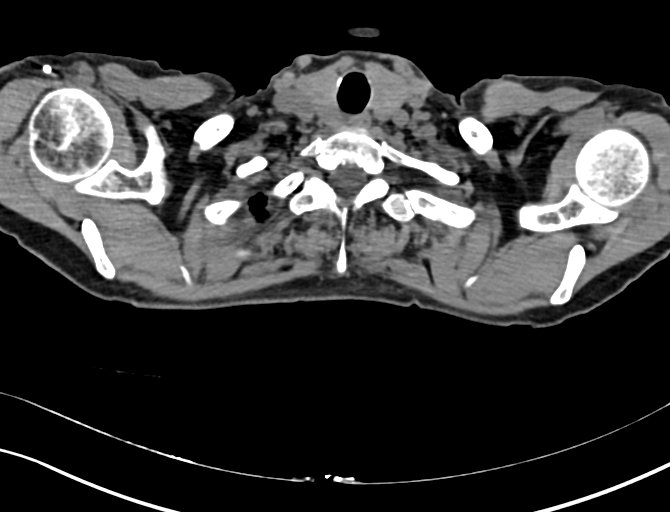
[im 145/157  lung]
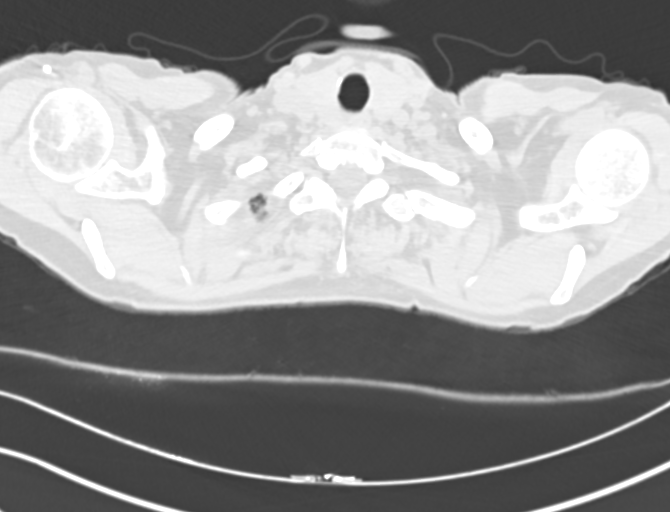

[Series 4: chest 2.00 br40 s3 · coronal · 0.62mm/px · 3 of 129 slices shown (2 of 2)]
[im 26/129  lung]
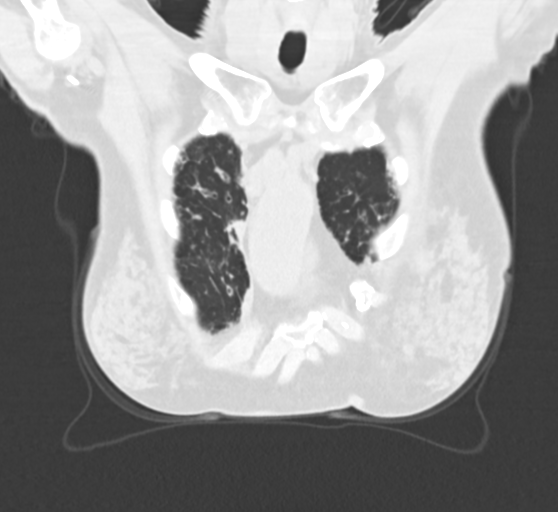
[im 52/129  lung]
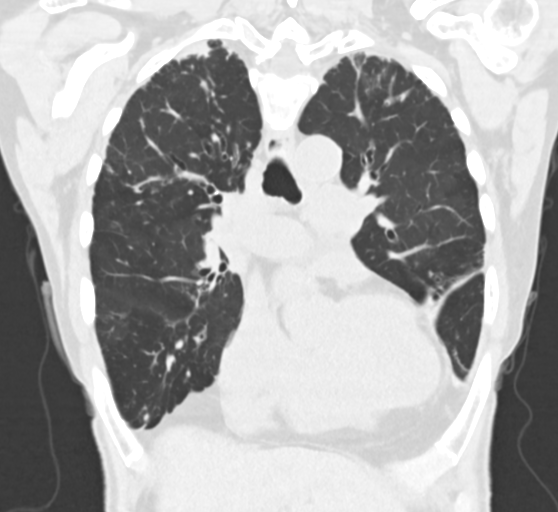
[im 77/129  lung]
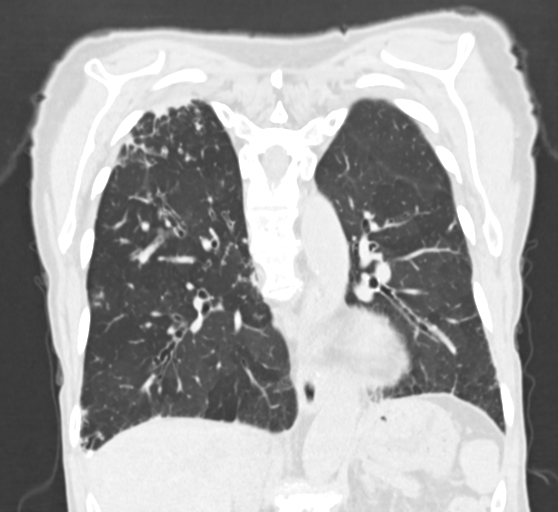

[12 of 36 positions shown; findings below may reference images not displayed]

FINDINGS: Cardiovascular: Mild cardiac enlargement. No pericardial effusion.
Aortic atherosclerosis. Lad coronary artery calcification.

Mediastinum/Nodes: Normal appearance of the thyroid gland. The
trachea appears patent and is midline. Insert esophagus no enlarged
axillary or supraclavicular lymph nodes. Subcarinal lymph node
measures 1.5 cm, image 78/2. Unchanged from previous exam. Hilar
structures are suboptimally evaluated due to lack of IV contrast
material.

Lungs/Pleura: No pleural effusion. Severe bilateral cylindrical
bronchiectasis is again noted. Peribronchovascular nodularity and
peripheral mucoid impaction again noted. Chronic scarring and volume
loss within the lingula is similar to previous exam. Progressive
subpleural consolidation within the posterior and lateral right
upper lobe is identified, image 51/8.

Upper Abdomen: No acute abnormality.

Musculoskeletal: Scoliosis and degenerative disc disease identified.
IMPRESSION: 1. Severe bilateral cylindrical bronchiectasis with
peribronchovascular nodularity and peripheral mucoid impaction.
Findings are compatible with chronic indolent atypical infection
such as MAYABEL.
2. Progressive subpleural consolidation within the posterior and
lateral right upper lobe. Likely postinflammatory or infectious in
etiology. Advise follow-up imaging with repeat CT of the chest in
3-6 months to ensure stability or resolution
3. Aortic atherosclerosis and multi vessel coronary artery
calcification.

Aortic Atherosclerosis ([4P]-[4P]).

## 2019-06-21 MED ORDER — LIDOCAINE HCL 1 % IJ SOLN
INTRAMUSCULAR | Status: DC | PRN
  Administered 2019-06-21: 10 mL

## 2019-06-21 MED ORDER — HEPARIN SOD (PORK) LOCK FLUSH 100 UNIT/ML IV SOLN
250.0000 [IU] | Freq: Every day | INTRAVENOUS | Status: DC
  Administered 2019-06-21: 250 [IU]

## 2019-06-21 MED ORDER — SODIUM CHLORIDE 3 % IN NEBU: 4.0000 mL | INHALATION_SOLUTION | Freq: Two times a day (BID) | RESPIRATORY_TRACT | 2 refills | Status: DC | PRN

## 2019-06-21 MED ORDER — SODIUM CHLORIDE 0.9 % IV SOLN
2.0000 g | Freq: Once | INTRAVENOUS | Status: AC
  Administered 2019-06-21: 2 g via INTRAVENOUS
  Filled 2019-06-21: qty 2

## 2019-06-21 MED ORDER — HEPARIN SOD (PORK) LOCK FLUSH 100 UNIT/ML IV SOLN
INTRAVENOUS | Status: AC
  Filled 2019-06-21: qty 5

## 2019-06-21 MED ORDER — LIDOCAINE HCL 1 % IJ SOLN
INTRAMUSCULAR | Status: AC
  Filled 2019-06-21: qty 20

## 2019-06-21 MED ORDER — HEPARIN SOD (PORK) LOCK FLUSH 100 UNIT/ML IV SOLN: 250.0000 [IU] | INTRAVENOUS | Status: DC | PRN

## 2019-06-21 NOTE — Procedures (Signed)
Interventional Radiology Procedure:   Indications: Pseudomonas infection  Procedure: PICC line placement  Findings: Single lumen right brachial vein, tip in lower SVC.  Complications: None     EBL: less than 10 ml  Plan: PICC is ready to use   Kaylem Gidney R. Lowella Dandy, MD  Pager: [phone]

## 2019-06-21 NOTE — Discharge Instructions (Signed)
Cefepime Injection What is this medicine? CEFEPIME (SEF e pim) is a cephalosporin antibiotic. It treats some infections caused by bacteria. It will not work for colds, the flu, or other viruses. This medicine may be used for other purposes; ask your health care provider or pharmacist if you have questions. COMMON BRAND NAME(S): Maxipime What should I tell my health care provider before I take this medicine? They need to know if you have any of these conditions:  bleeding problems  kidney disease  stomach, intestinal problems like colitis  an unusual or allergic reaction to cefepime, other cephalosporin or penicillin antibiotics, imipenem, other foods, dyes or preservatives  pregnant or trying to get pregnant  breast-feeding How should I use this medicine? This drug is injected into a muscle or a vein. It is usually given by a health care provider in a hospital or clinic setting. If you get this drug at home, you will be taught how to prepare and give it. Use exactly as directed. Take it as directed on the prescription label at the same time every day. Keep taking it unless your health care provider tells you to stop. It is important that you put your used needles and syringes in a special sharps container. Do not put them in a trash can. If you do not have a sharps container, call your pharmacist or health care provider to get one. Talk to your health care provider about the use of this drug in children. While it may be prescribed for children as young as 2 months for selected conditions, precautions do apply. Overdosage: If you think you have taken too much of this medicine contact a poison control center or emergency room at once. NOTE: This medicine is only for you. Do not share this medicine with others. What if I miss a dose? It is important not to miss your dose. Call your health care provider if you are unable to keep an appointment. If you give yourself this drug at home and you  miss a dose, take it as soon as you can. If it is almost time for your next dose, take only that dose. Do not take double or extra doses. What may interact with this medicine? This medicine may interact with the following medications:  birth control pills  certain medicines for infection like amikacin, gentamicin, tobramycin  diuretics This list may not describe all possible interactions. Give your health care provider a list of all the medicines, herbs, non-prescription drugs, or dietary supplements you use. Also tell them if you smoke, drink alcohol, or use illegal drugs. Some items may interact with your medicine. What should I watch for while using this medicine? Tell your doctor or health care provider if your symptoms do not begin to improve or if you get new symptoms. Your doctor will monitor your condition and blood work as needed. This medicine may cause serious skin reactions. They can happen weeks to months after starting the medicine. Contact your health care provider right away if you notice fevers or flu-like symptoms with a rash. The rash may be red or purple and then turn into blisters or peeling of the skin. Or, you might notice a red rash with swelling of the face, lips or lymph nodes in your neck or under your arms. Do not treat diarrhea with over-the-counter products. Contact your doctor if you have diarrhea that lasts more than 2 days or if the diarrhea is severe and watery. This medicine can interfere with some urine  glucose tests. If you use such tests, talk with your health care provider. What side effects may I notice from receiving this medicine? Side effects that you should report to your doctor or health care professional as soon as possible:  allergic reactions like skin rash, itching or hives, swelling of the face, lips, or tongue  confusion, hallucinations  difficulty breathing, wheezing  fever  pain or difficulty passing urine  redness, blistering, peeling  or loosening of the skin, including inside the mouth  seizures  stiff, rigid muscles  unusual bleeding, bruising  unusually weak or tired Side effects that usually do not require medical attention (report to your doctor or health care professional if they continue or are bothersome):  diarrhea  headache  mouth sores, irritation  nausea, vomiting  pain, swelling and irritation at the injection site This list may not describe all possible side effects. Call your doctor for medical advice about side effects. You may report side effects to FDA at 1-800-FDA-1088. Where should I keep my medicine? Keep out of the reach of children and pets. You will be instructed on how to store this drug. Protect from light. Throw away any unused drug after the expiration date. NOTE: This sheet is a summary. It may not cover all possible information. If you have questions about this medicine, talk to your doctor, pharmacist, or health care provider.  2020 Elsevier/Gold Standard (2018-11-12 12:17:50)

## 2019-07-05 ENCOUNTER — Telehealth: Payer: Self-pay | Admitting: *Deleted

## 2019-07-05 ENCOUNTER — Other Ambulatory Visit: Payer: Self-pay

## 2019-07-05 NOTE — Telephone Encounter (Signed)
[place], home health nurse from [facility], called for pull PICC orders. Patient completes IV cefepime today, 3/15. Please advise.  Andree Coss, RN

## 2019-07-05 NOTE — Telephone Encounter (Signed)
Per Dr Drue Second, relayed verbal order to [facility] at [facility] pharmacy to extend IV cefepime 1 more week and pull PICC after last dose.  Order repeated and verified. Patient aware. Andree Coss, RN

## 2019-07-08 ENCOUNTER — Other Ambulatory Visit: Payer: Self-pay

## 2019-07-08 ENCOUNTER — Encounter: Payer: Self-pay | Admitting: Internal Medicine

## 2019-07-08 ENCOUNTER — Ambulatory Visit (INDEPENDENT_AMBULATORY_CARE_PROVIDER_SITE_OTHER): Payer: Medicare HMO | Admitting: Internal Medicine

## 2019-07-08 VITALS — BP 149/80 | HR 67 | Temp 97.5°F | Ht 67.0 in | Wt 128.0 lb

## 2019-07-08 DIAGNOSIS — Z8619 Personal history of other infectious and parasitic diseases: Secondary | ICD-10-CM | POA: Diagnosis not present

## 2019-07-08 DIAGNOSIS — J471 Bronchiectasis with (acute) exacerbation: Secondary | ICD-10-CM | POA: Diagnosis not present

## 2019-07-08 DIAGNOSIS — H5789 Other specified disorders of eye and adnexa: Secondary | ICD-10-CM | POA: Diagnosis not present

## 2019-07-08 NOTE — Progress Notes (Signed)
RFV : follow up for PsA bronchiectasis flare  Patient ID: Stacy Sanchez, female   DOB: 1946-01-08, 74 y.o.   MRN: [id number]  HPI Stacy Sanchez is a 74yo F with bronchiectasis, hx of pulmonary MAC, and pseudomonas flare since being started on treatment: Not coughing as much and stilil have shortness of breath. Has been ambulating without oxygen and her pulse ox when she is done is 90-91%. Discussed with her that since still symptomatic, we will do additional week of iv abtx Outpatient Encounter Medications as of 07/08/2019  Medication Sig  . acetaminophen (TYLENOL) 650 MG CR tablet Take 1,300 mg by mouth every 8 (eight) hours as needed for pain.   Marland Kitchen aspirin EC 81 MG tablet Take 81 mg by mouth daily.  Marland Kitchen escitalopram (LEXAPRO) 10 MG tablet Take 10 mg by mouth daily.  Marland Kitchen gabapentin (NEURONTIN) 300 MG capsule Take 300 mg by mouth 2 (two) times daily.  Marland Kitchen ipratropium-albuterol (DUONEB) 0.5-2.5 (3) MG/3ML SOLN Take 3 mLs by nebulization 3 (three) times daily as needed. (Patient taking differently: Take 3 mLs by nebulization in the morning and at bedtime. )  . levothyroxine (SYNTHROID, LEVOTHROID) 50 MCG tablet Take 50-100 mcg by mouth See admin instructions. Take 50 mcg daily on Monday through Saturday and take 100 mcg on Sunday  . Multiple Vitamin (MULTIVITAMIN WITH MINERALS) TABS tablet Take 1 tablet by mouth daily.  . nadolol (CORGARD) 20 MG tablet Take 10 mg by mouth 2 (two) times daily.   . polyethylene glycol powder (GLYCOLAX/MIRALAX) 17 GM/SCOOP powder Take 17 g by mouth daily as needed for moderate constipation.   . Probiotic CAPS Take 1 capsule by mouth daily.  . rosuvastatin (CRESTOR) 10 MG tablet Take 10 mg by mouth daily.  . sodium chloride HYPERTONIC 3 % nebulizer solution Take 4 mLs by nebulization 2 (two) times daily as needed for other.  . Nebulizers (COMPRESSOR/NEBULIZER) MISC 1 Product by Does not apply route once for 1 dose.  . [DISCONTINUED] Loteprednol-Tobramycin (ZYLET) 0.5-0.3 % SUSP  Place 1 drop into the left eye 4 (four) times daily.  . [DISCONTINUED] Polyethyl Glycol-Propyl Glycol (SYSTANE OP) Place 1 drop into both eyes daily as needed (burning eyes).   No facility-administered encounter medications on file as of 07/08/2019.     Patient Active Problem List   Diagnosis Date Noted  . Chronic respiratory failure with hypoxia ([place]) 12/07/2018  . Acute respiratory failure with hypoxia ([place]) 11/27/2018  . Bronchiectasis with acute exacerbation ([place]) 11/27/2018  . 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 Thrush 11/27/2018  . CAD (coronary artery disease) 11/27/2018  . CKD (chronic kidney disease), stage III 11/27/2018  . Essential hypertension 11/27/2018  . Generalized anxiety disorder 11/27/2018  . Peripheral neuropathy 11/27/2018  . Mycobacterium avium complex ([place]) 11/27/2018     Health Maintenance Due  Topic Date Due  . Hepatitis C Screening  Never done  . COLONOSCOPY  Never done  . DEXA SCAN  Never done     Review of Systems  Physical Exam   BP (!) 149/80   Pulse 67   Temp (!) 97.5 F (36.4 C)   Ht _0  (1.702 m)   Wt 128 lb (58.1 kg)   SpO2 93%   BMI 20.05 kg/m   Physical Exam  Constitutional:  oriented to person, place, and time. appears well-developed and well-nourished. No distress.  HENT: [place]/AT, PERRLA, no scleral icterus Mouth/Throat: Oropharynx is clear and moist. No oropharyngeal exudate.  Cardiovascular: Normal rate, regular rhythm and normal heart  sounds. Exam reveals no gallop and no friction rub.  No murmur heard.  Pulmonary/Chest: Effort normal and breath sounds normal. No respiratory distress.  tuberlar breath sounds Neck = supple, no nuchal rigidity Lymphadenopathy: no cervical adenopathy. No axillary adenopathy Neurological: alert and oriented to person, place, and time.  Skin: Skin is warm and dry. No rash noted. No erythema.  Psychiatric: a normal mood and affect.  behavior is normal.   CBC Lab Results  Component  Value Date   WBC 6.4 06/09/2019   RBC 4.35 06/09/2019   HGB 13.1 06/09/2019   HCT 39.5 06/09/2019   PLT 171 06/09/2019   MCV 90.8 06/09/2019   MCH 30.1 06/09/2019   MCHC 33.2 06/09/2019   RDW 14.0 06/09/2019   LYMPHSABS 1,658 06/09/2019   MONOABS 0.7 02/04/2019   EOSABS 173 06/09/2019    BMET Lab Results  Component Value Date   NA 140 06/09/2019   K 5.0 06/09/2019   CL 101 06/09/2019   CO2 32 06/09/2019   GLUCOSE 79 06/09/2019   BUN 30 (H) 06/09/2019   CREATININE 1.29 (H) 06/09/2019   CALCIUM 9.3 06/09/2019   GFRNONAA 59 (L) 12/01/2018   GFRAA >60 12/01/2018      Assessment and Plan  Pseudomonal Bronchiectasis flare- will give additional week of iv cefepime  Just started on twice a day -pulmonary hygiene Follow up with pulmonary with dr Halford Chessman.   Reviewed labs from home health - is stable Pull picc line this week when abtx are completed wlil need to check sputum for AFB will give cup to bring in next visit  Complaint of burning eyes - may have allergies. Recommended to use zaditor drops to see if it improves her symptoms

## 2019-07-08 NOTE — Patient Instructions (Addendum)
  Google: please search for [facility] - acapella valve instructions"  [email or web address] ---------------------------------------------------------------------------------------------------------------- How to Use the Acapella Assure proper setting of the dial on the end of the Acapella. This is the end opposite the mouthpiece. Your health care provider will set the dial when you get your Acapella. Rotate the end toward the + (plus) to increase resistance. Rotate the end toward -  (minus) to decrease resistance. Sit up with good posture to use the Acapella. Sometimes we may have you lie in a postural drainage position to use the Acapella. Postural drainage positions include lying flat on your back, flat on your side and alternating sides. Your health care provider will show you postural drainage positions to use if these are recommended. Take in a fairly deep breath and hold it for about 3 seconds. Place the Acapella mouthpiece in your mouth. Seal your lips tightly around the mouthpiece. Exhale as much as possible (but not to forcefully) through the mouthpiece. Keep your cheeks as firm as possible when you exhale. Try not to inhale through the device. Repeat this maneuver for 10 breaths. Try to resist coughing during this phase. After these 10 blows, perform 3 huffs, then a big cough to bring the sputum up and out. Try not to swallow the mucus. Huff coughing is a type of coughing if you have trouble clearing your mucus. Take a breath that is slightly deeper than normal. Use your stomach muscles to make a series of 3 rapid exhalations with the airway open, making a "ha, ha, ha" sound. Follow this by normal breathing and a deep cough if you feel mucus moving. Repeat steps 1 through 7 for 15 minutes 2 to 4 times a day (or as prescribed by your health care provider). Your health care provider will tell you  how many times a day to use the Acapella. If you use a short-acting inhaled bronchodilator, use the Acapella 15 minutes after you inhale the medicine.   How to Clean and Care for the Acapella (green or blue) Wash your hands. Clean the Acapella every day. Take the mouthpiece off the Acapella body. The mouthpiece and body need to be washed. Daily - Clean the mouthpiece and body of the Acapella daily with liquid dish detergent - Rinse with water. Disinfect Weekly - Clean the mouthpiece and body with alcohol - soak both parts in 70 percent isopropyl alcohol for 5 minutes. OR Clean mouthpiece and body with hydrogen peroxide - soak both parts in 3 percent hydrogen peroxide for 30 minutes. Note: The Acapella should not be placed in the automatic dishwasher, boiled or bleached. Rinse in clean water. Shake off excess water. Drain dry the device. Place each piece downward or rest the unit on its side. Replace the mouthpiece when the unit is completely dry and ready for use.

## 2019-07-13 ENCOUNTER — Other Ambulatory Visit: Payer: Self-pay

## 2019-07-13 ENCOUNTER — Encounter (HOSPITAL_COMMUNITY): Payer: Self-pay | Admitting: Emergency Medicine

## 2019-07-13 ENCOUNTER — Inpatient Hospital Stay (HOSPITAL_COMMUNITY)
Admission: EM | Admit: 2019-07-13 | Discharge: 2019-07-17 | DRG: 682 | Disposition: A | Discharge status: Home Health | Payer: Medicare HMO | Attending: Student | Admitting: Student

## 2019-07-13 DIAGNOSIS — Z888 Allergy status to other drugs, medicaments and biological substances status: Secondary | ICD-10-CM

## 2019-07-13 DIAGNOSIS — M1990 Unspecified osteoarthritis, unspecified site: Secondary | ICD-10-CM | POA: Diagnosis present

## 2019-07-13 DIAGNOSIS — T361X5A Adverse effect of cephalosporins and other beta-lactam antibiotics, initial encounter: Secondary | ICD-10-CM | POA: Diagnosis present

## 2019-07-13 DIAGNOSIS — F39 Unspecified mood [affective] disorder: Secondary | ICD-10-CM | POA: Diagnosis present

## 2019-07-13 DIAGNOSIS — J479 Bronchiectasis, uncomplicated: Secondary | ICD-10-CM

## 2019-07-13 DIAGNOSIS — Z8349 Family history of other endocrine, nutritional and metabolic diseases: Secondary | ICD-10-CM

## 2019-07-13 DIAGNOSIS — Z7982 Long term (current) use of aspirin: Secondary | ICD-10-CM

## 2019-07-13 DIAGNOSIS — G629 Polyneuropathy, unspecified: Secondary | ICD-10-CM | POA: Diagnosis present

## 2019-07-13 DIAGNOSIS — G253 Myoclonus: Secondary | ICD-10-CM | POA: Diagnosis present

## 2019-07-13 DIAGNOSIS — F8081 Childhood onset fluency disorder: Secondary | ICD-10-CM | POA: Diagnosis present

## 2019-07-13 DIAGNOSIS — N1831 Chronic kidney disease, stage 3a: Secondary | ICD-10-CM | POA: Diagnosis present

## 2019-07-13 DIAGNOSIS — G9341 Metabolic encephalopathy: Secondary | ICD-10-CM | POA: Diagnosis present

## 2019-07-13 DIAGNOSIS — E039 Hypothyroidism, unspecified: Secondary | ICD-10-CM | POA: Diagnosis present

## 2019-07-13 DIAGNOSIS — N19 Unspecified kidney failure: Secondary | ICD-10-CM

## 2019-07-13 DIAGNOSIS — R339 Retention of urine, unspecified: Secondary | ICD-10-CM | POA: Diagnosis present

## 2019-07-13 DIAGNOSIS — Z79890 Hormone replacement therapy (postmenopausal): Secondary | ICD-10-CM

## 2019-07-13 DIAGNOSIS — R4182 Altered mental status, unspecified: Secondary | ICD-10-CM

## 2019-07-13 DIAGNOSIS — I2510 Atherosclerotic heart disease of native coronary artery without angina pectoris: Secondary | ICD-10-CM | POA: Diagnosis present

## 2019-07-13 DIAGNOSIS — J9611 Chronic respiratory failure with hypoxia: Secondary | ICD-10-CM | POA: Diagnosis present

## 2019-07-13 DIAGNOSIS — N179 Acute kidney failure, unspecified: Principal | ICD-10-CM

## 2019-07-13 DIAGNOSIS — N200 Calculus of kidney: Secondary | ICD-10-CM | POA: Diagnosis present

## 2019-07-13 DIAGNOSIS — F329 Major depressive disorder, single episode, unspecified: Secondary | ICD-10-CM | POA: Diagnosis present

## 2019-07-13 DIAGNOSIS — Z20822 Contact with and (suspected) exposure to covid-19: Secondary | ICD-10-CM | POA: Diagnosis present

## 2019-07-13 DIAGNOSIS — Z9981 Dependence on supplemental oxygen: Secondary | ICD-10-CM

## 2019-07-13 DIAGNOSIS — F411 Generalized anxiety disorder: Secondary | ICD-10-CM | POA: Diagnosis present

## 2019-07-13 DIAGNOSIS — D696 Thrombocytopenia, unspecified: Secondary | ICD-10-CM | POA: Diagnosis present

## 2019-07-13 DIAGNOSIS — Z79899 Other long term (current) drug therapy: Secondary | ICD-10-CM

## 2019-07-13 DIAGNOSIS — I129 Hypertensive chronic kidney disease with stage 1 through stage 4 chronic kidney disease, or unspecified chronic kidney disease: Secondary | ICD-10-CM | POA: Diagnosis present

## 2019-07-13 DIAGNOSIS — D649 Anemia, unspecified: Secondary | ICD-10-CM | POA: Diagnosis present

## 2019-07-13 HISTORY — DX: Pseudomonas (aeruginosa) (mallei) (pseudomallei) as the cause of diseases classified elsewhere: B96.5

## 2019-07-13 HISTORY — DX: Other specified respiratory disorders: J98.8

## 2019-07-13 NOTE — ED Triage Notes (Signed)
Patient reports intermittent " jerking" of arms and legs for 2 days and " stuttering" this afternoon , patient suspects side effects of Cefipime IV antibiotic that she received last night for her lung infection . Alert and oriented , respirations unlabored , speech clear with no facial asymmetry , no arm drift , denies fever or chills  .

## 2019-07-14 ENCOUNTER — Inpatient Hospital Stay (HOSPITAL_COMMUNITY): Payer: Medicare HMO

## 2019-07-14 ENCOUNTER — Telehealth: Payer: Self-pay | Admitting: *Deleted

## 2019-07-14 ENCOUNTER — Encounter (HOSPITAL_COMMUNITY): Payer: Self-pay | Admitting: Family Medicine

## 2019-07-14 DIAGNOSIS — D696 Thrombocytopenia, unspecified: Secondary | ICD-10-CM | POA: Diagnosis present

## 2019-07-14 DIAGNOSIS — J479 Bronchiectasis, uncomplicated: Secondary | ICD-10-CM | POA: Diagnosis not present

## 2019-07-14 DIAGNOSIS — N1831 Chronic kidney disease, stage 3a: Secondary | ICD-10-CM | POA: Diagnosis present

## 2019-07-14 DIAGNOSIS — J9611 Chronic respiratory failure with hypoxia: Secondary | ICD-10-CM | POA: Diagnosis present

## 2019-07-14 DIAGNOSIS — Z7982 Long term (current) use of aspirin: Secondary | ICD-10-CM | POA: Diagnosis not present

## 2019-07-14 DIAGNOSIS — G253 Myoclonus: Secondary | ICD-10-CM | POA: Diagnosis present

## 2019-07-14 DIAGNOSIS — M1990 Unspecified osteoarthritis, unspecified site: Secondary | ICD-10-CM | POA: Diagnosis present

## 2019-07-14 DIAGNOSIS — Z888 Allergy status to other drugs, medicaments and biological substances status: Secondary | ICD-10-CM | POA: Diagnosis not present

## 2019-07-14 DIAGNOSIS — R339 Retention of urine, unspecified: Secondary | ICD-10-CM | POA: Diagnosis present

## 2019-07-14 DIAGNOSIS — R2689 Other abnormalities of gait and mobility: Secondary | ICD-10-CM | POA: Diagnosis not present

## 2019-07-14 DIAGNOSIS — N200 Calculus of kidney: Secondary | ICD-10-CM | POA: Diagnosis present

## 2019-07-14 DIAGNOSIS — F411 Generalized anxiety disorder: Secondary | ICD-10-CM | POA: Diagnosis present

## 2019-07-14 DIAGNOSIS — N179 Acute kidney failure, unspecified: Principal | ICD-10-CM

## 2019-07-14 DIAGNOSIS — G9341 Metabolic encephalopathy: Secondary | ICD-10-CM | POA: Diagnosis present

## 2019-07-14 DIAGNOSIS — Z79890 Hormone replacement therapy (postmenopausal): Secondary | ICD-10-CM | POA: Diagnosis not present

## 2019-07-14 DIAGNOSIS — F329 Major depressive disorder, single episode, unspecified: Secondary | ICD-10-CM | POA: Diagnosis present

## 2019-07-14 DIAGNOSIS — M549 Dorsalgia, unspecified: Secondary | ICD-10-CM | POA: Diagnosis not present

## 2019-07-14 DIAGNOSIS — Z881 Allergy status to other antibiotic agents status: Secondary | ICD-10-CM

## 2019-07-14 DIAGNOSIS — I2510 Atherosclerotic heart disease of native coronary artery without angina pectoris: Secondary | ICD-10-CM | POA: Diagnosis present

## 2019-07-14 DIAGNOSIS — T361X5A Adverse effect of cephalosporins and other beta-lactam antibiotics, initial encounter: Secondary | ICD-10-CM | POA: Diagnosis present

## 2019-07-14 DIAGNOSIS — F8081 Childhood onset fluency disorder: Secondary | ICD-10-CM | POA: Diagnosis present

## 2019-07-14 DIAGNOSIS — F39 Unspecified mood [affective] disorder: Secondary | ICD-10-CM | POA: Diagnosis present

## 2019-07-14 DIAGNOSIS — Z20822 Contact with and (suspected) exposure to covid-19: Secondary | ICD-10-CM | POA: Diagnosis present

## 2019-07-14 DIAGNOSIS — Z9981 Dependence on supplemental oxygen: Secondary | ICD-10-CM | POA: Diagnosis not present

## 2019-07-14 DIAGNOSIS — G629 Polyneuropathy, unspecified: Secondary | ICD-10-CM | POA: Diagnosis present

## 2019-07-14 DIAGNOSIS — F985 Adult onset fluency disorder: Secondary | ICD-10-CM | POA: Diagnosis not present

## 2019-07-14 DIAGNOSIS — D649 Anemia, unspecified: Secondary | ICD-10-CM | POA: Diagnosis present

## 2019-07-14 DIAGNOSIS — E039 Hypothyroidism, unspecified: Secondary | ICD-10-CM | POA: Diagnosis present

## 2019-07-14 DIAGNOSIS — I129 Hypertensive chronic kidney disease with stage 1 through stage 4 chronic kidney disease, or unspecified chronic kidney disease: Secondary | ICD-10-CM | POA: Diagnosis present

## 2019-07-14 LAB — URINALYSIS, COMPLETE (UACMP) WITH MICROSCOPIC
Bilirubin Urine: NEGATIVE
Glucose, UA: NEGATIVE mg/dL
Ketones, ur: NEGATIVE mg/dL
Leukocytes,Ua: NEGATIVE
Nitrite: NEGATIVE
Protein, ur: 100 mg/dL — AB
Specific Gravity, Urine: 1.014 (ref 1.005–1.030)
pH: 5 (ref 5.0–8.0)

## 2019-07-14 LAB — COMPREHENSIVE METABOLIC PANEL
ALT: 12 U/L (ref 0–44)
AST: 22 U/L (ref 15–41)
Albumin: 2.9 g/dL — ABNORMAL LOW (ref 3.5–5.0)
Alkaline Phosphatase: 50 U/L (ref 38–126)
Anion gap: 10 (ref 5–15)
BUN: 44 mg/dL — ABNORMAL HIGH (ref 8–23)
CO2: 28 mmol/L (ref 22–32)
Calcium: 8.6 mg/dL — ABNORMAL LOW (ref 8.9–10.3)
Chloride: 103 mmol/L (ref 98–111)
Creatinine, Ser: 3.11 mg/dL — ABNORMAL HIGH (ref 0.44–1.00)
GFR calc Af Amer: 16 mL/min — ABNORMAL LOW (ref >60)
GFR calc non Af Amer: 14 mL/min — ABNORMAL LOW (ref >60)
Glucose, Bld: 108 mg/dL — ABNORMAL HIGH (ref 70–99)
Potassium: 4.7 mmol/L (ref 3.5–5.1)
Sodium: 141 mmol/L (ref 135–145)
Total Bilirubin: 0.6 mg/dL (ref 0.3–1.2)
Total Protein: 6.9 g/dL (ref 6.5–8.1)

## 2019-07-14 LAB — CBC WITH DIFFERENTIAL/PLATELET
Abs Immature Granulocytes: 0.01 10*3/uL (ref 0.00–0.07)
Basophils Absolute: 0.1 10*3/uL (ref 0.0–0.1)
Basophils Relative: 1 %
Eosinophils Absolute: 0.4 10*3/uL (ref 0.0–0.5)
Eosinophils Relative: 7 %
HCT: 38.3 % (ref 36.0–46.0)
Hemoglobin: 11.8 g/dL — ABNORMAL LOW (ref 12.0–15.0)
Immature Granulocytes: 0 %
Lymphocytes Relative: 25 %
Lymphs Abs: 1.5 10*3/uL (ref 0.7–4.0)
MCH: 30.1 pg (ref 26.0–34.0)
MCHC: 30.8 g/dL (ref 30.0–36.0)
MCV: 97.7 fL (ref 80.0–100.0)
Monocytes Absolute: 0.7 10*3/uL (ref 0.1–1.0)
Monocytes Relative: 12 %
Neutro Abs: 3.3 10*3/uL (ref 1.7–7.7)
Neutrophils Relative %: 55 %
Platelets: 112 10*3/uL — ABNORMAL LOW (ref 150–400)
RBC: 3.92 MIL/uL (ref 3.87–5.11)
RDW: 14.6 % (ref 11.5–15.5)
WBC: 6 10*3/uL (ref 4.0–10.5)
nRBC: 0 % (ref 0.0–0.2)

## 2019-07-14 LAB — CREATININE, URINE, RANDOM: Creatinine, Urine: 98.83 mg/dL

## 2019-07-14 LAB — RETICULOCYTES
Immature Retic Fract: 10.1 % (ref 2.3–15.9)
RBC.: 3.62 MIL/uL — ABNORMAL LOW (ref 3.87–5.11)
Retic Count, Absolute: 15.1 10*3/uL — ABNORMAL LOW (ref 19.0–186.0)
Retic Ct Pct: 0.4 % (ref 0.4–3.1)

## 2019-07-14 LAB — SARS CORONAVIRUS 2 (TAT 6-24 HRS): SARS Coronavirus 2: NEGATIVE

## 2019-07-14 LAB — PROTIME-INR
INR: 1.1 (ref 0.8–1.2)
Prothrombin Time: 14.4 seconds (ref 11.4–15.2)

## 2019-07-14 LAB — AMMONIA: Ammonia: 24 umol/L (ref 9–35)

## 2019-07-14 LAB — SODIUM, URINE, RANDOM: Sodium, Ur: 38 mmol/L

## 2019-07-14 IMAGING — US US RENAL
1 series · 13 of 25 positions shown · non-contrast
Comparison: None.

CLINICAL DATA: Acute kidney injury

EXAM:
RENAL / URINARY TRACT ULTRASOUND COMPLETE

[Series 1: us renal · 13 of 55 slices shown]
[im 1/55]
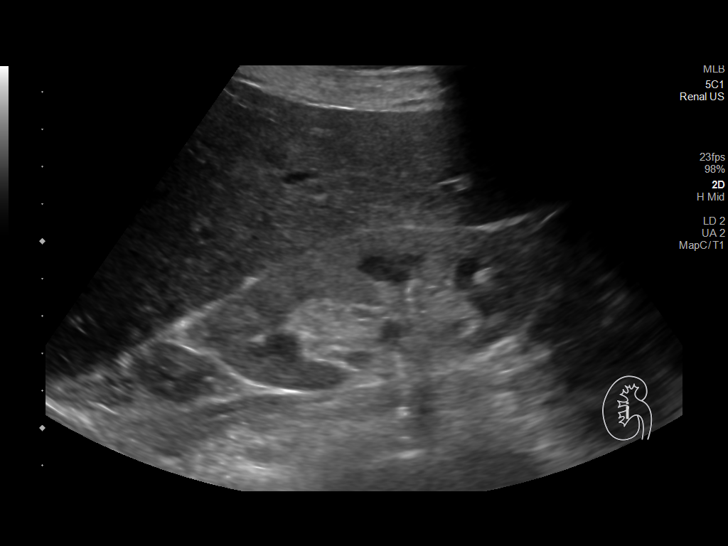
[im 5/55]
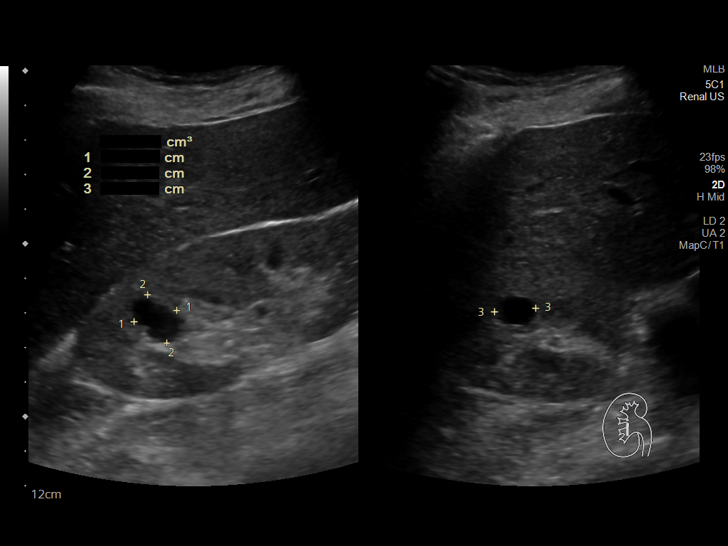
[im 10/55]
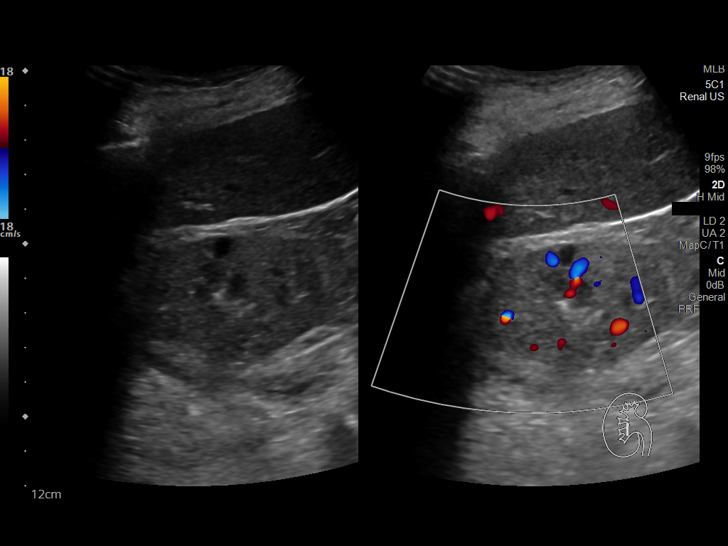
[im 14/55]
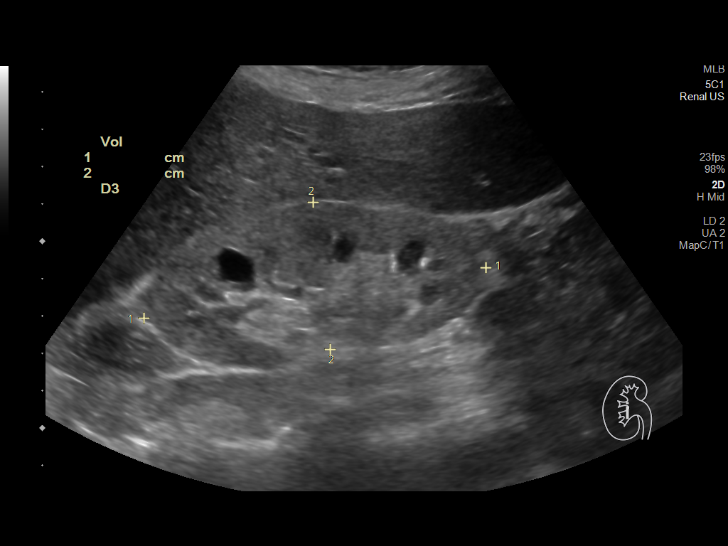
[im 19/55]
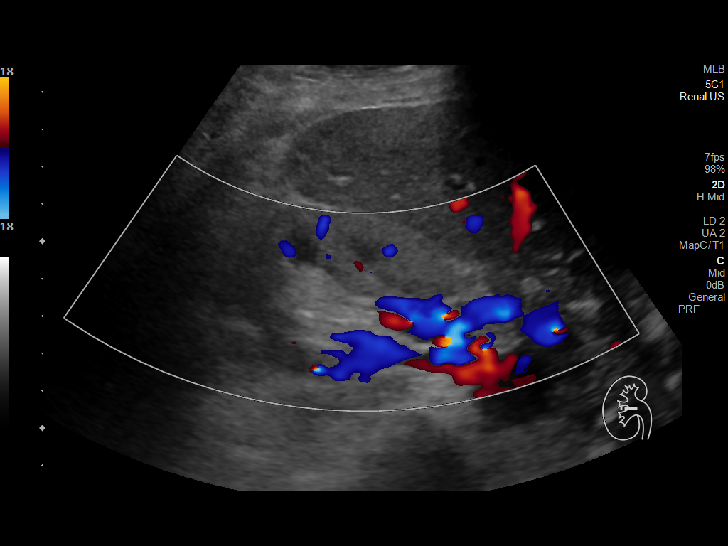
[im 23/55]
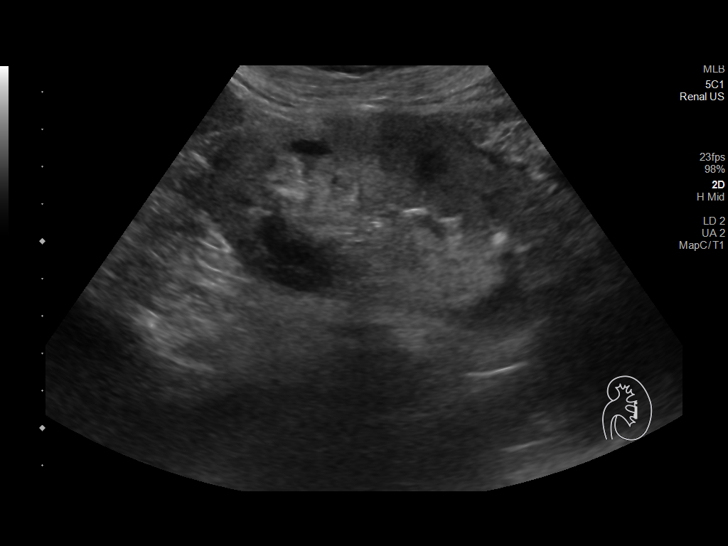
[im 28/55]
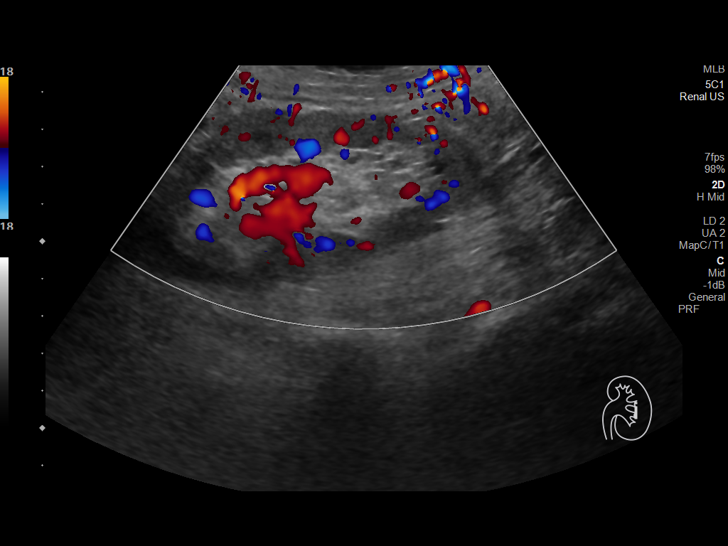
[im 32/55]
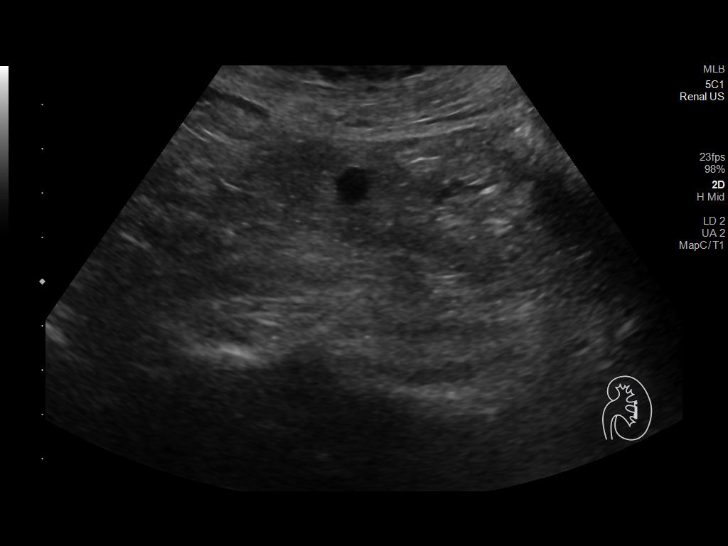
[im 37/55]
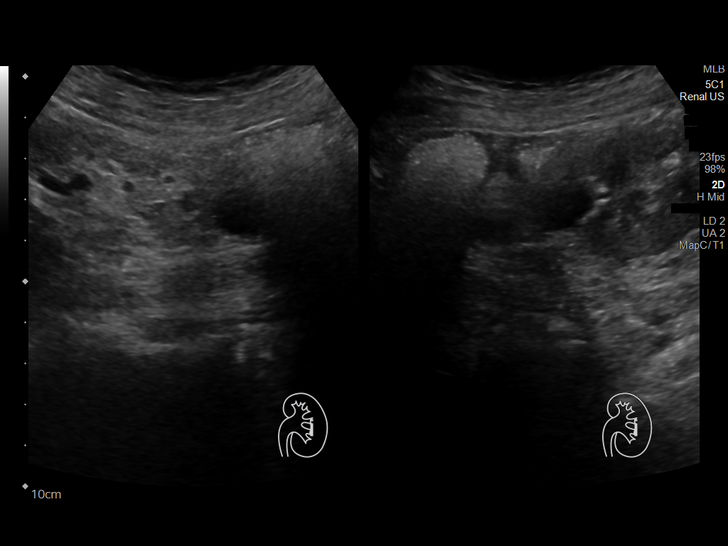
[im 41/55]
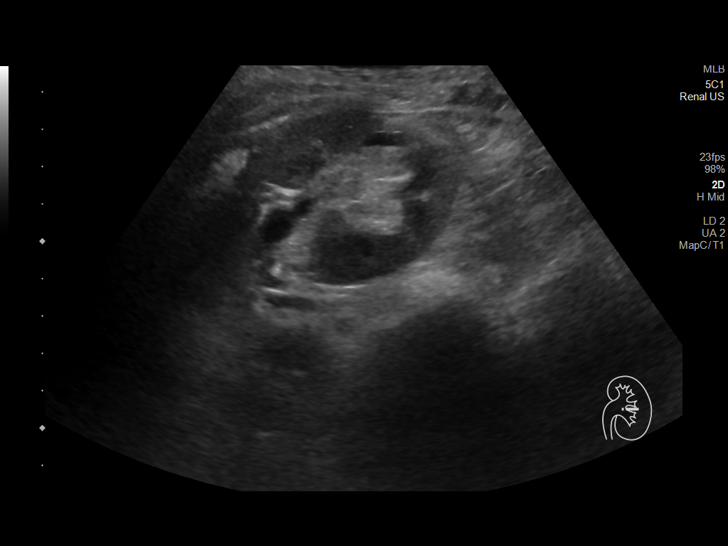
[im 46/55]
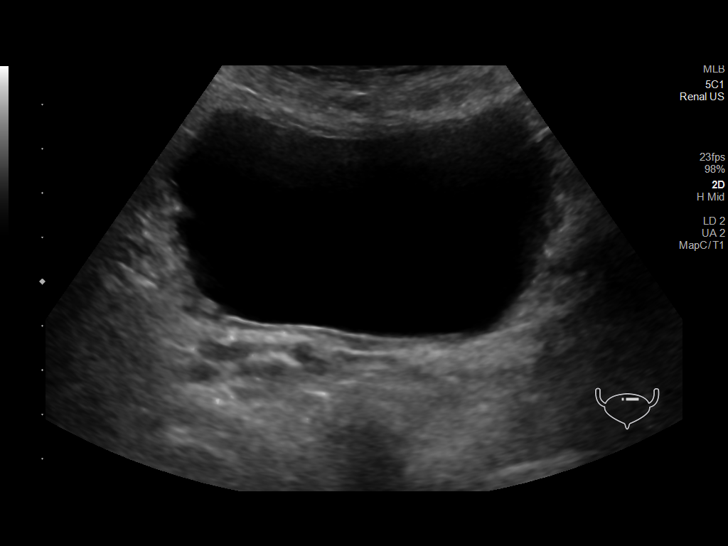
[im 50/55]
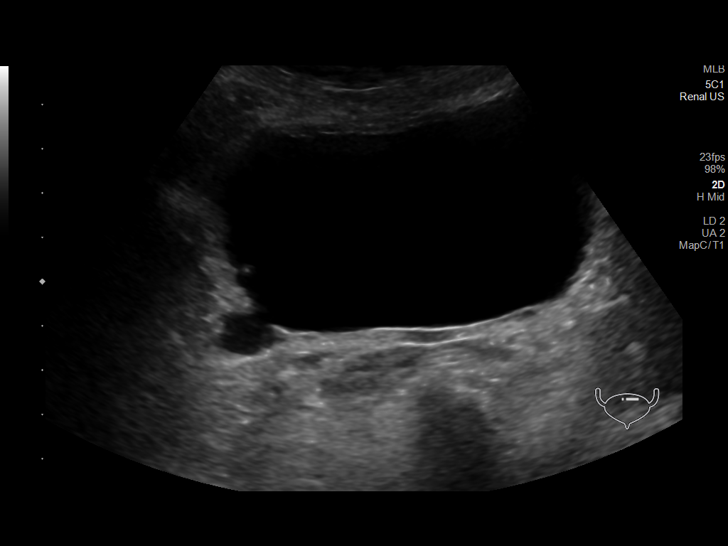
[im 55/55]
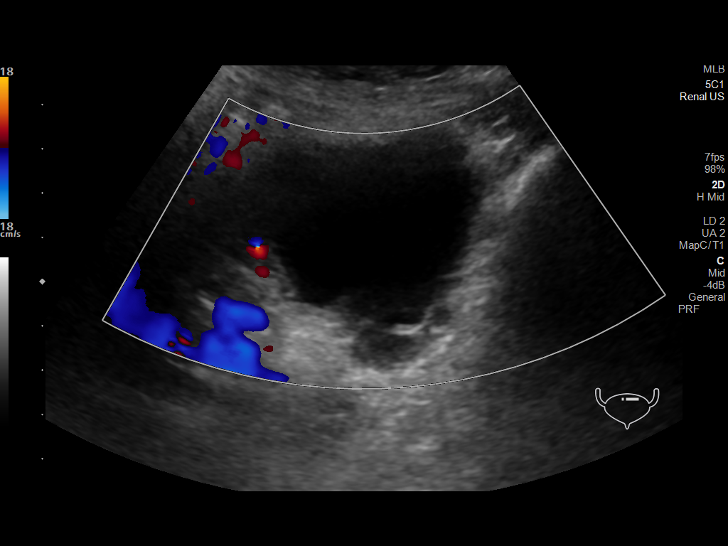

[13 of 25 positions shown; findings below may reference images not displayed]

FINDINGS: Right Kidney:

Renal measurements: 9.3 x 4.0 x 5.1 cm = volume: 98 mL. Increased
renal parenchymal echogenicity. 1.5 cm upper pole cyst. Additional
0.7 cm midpole cyst. No solid renal lesion, shadowing stone, or
hydronephrosis.

Left Kidney:

Renal measurements: 10.1 x 4.8 x 3.4 cm = volume: 85 mL. Increased
renal parenchymal echogenicity. 5 mm shadowing calcification of the
lower pole. 0.9 cm midpole cyst. Additional 1.4 cm lower pole left
renal cyst. No solid renal lesion or hydronephrosis.

Bladder:

Moderately distended. Small diverticulum of the posterior bladder
wall measuring approximately 1.5 cm. Somewhat irregular appearance
of the bladder wall contour. No intraluminal abnormality seen.

Other:

None.
IMPRESSION: 1. A single nonobstructing 5 mm left renal stone was seen.
Otherwise, no evidence of obstructive uropathy.
2. Findings of chronic medical renal disease.
3. Posterior bladder wall diverticulum.
4. Somewhat irregular bladder wall contour, which is not well
evaluated sonographically. Trabeculated bladder can be seen in the
setting of neurogenic bladder or bladder outlet obstruction.

## 2019-07-14 MED ORDER — LEVOTHYROXINE SODIUM 50 MCG PO TABS: 50.0000 ug | ORAL_TABLET | ORAL | Status: DC

## 2019-07-14 MED ORDER — IPRATROPIUM-ALBUTEROL 0.5-2.5 (3) MG/3ML IN SOLN: 3.0000 mL | Freq: Four times a day (QID) | RESPIRATORY_TRACT | Status: DC | PRN

## 2019-07-14 MED ORDER — HYDRALAZINE HCL 25 MG PO TABS
25.0000 mg | ORAL_TABLET | Freq: Three times a day (TID) | ORAL | Status: DC | PRN
  Administered 2019-07-14: 25 mg via ORAL
  Filled 2019-07-14: qty 1

## 2019-07-14 MED ORDER — SODIUM CHLORIDE 3 % IN NEBU
4.0000 mL | INHALATION_SOLUTION | Freq: Two times a day (BID) | RESPIRATORY_TRACT | Status: AC
  Administered 2019-07-14 – 2019-07-16 (×5): 4 mL via RESPIRATORY_TRACT
  Filled 2019-07-14 (×6): qty 4

## 2019-07-14 MED ORDER — NADOLOL 20 MG PO TABS
10.0000 mg | ORAL_TABLET | Freq: Every day | ORAL | Status: DC
  Administered 2019-07-14 – 2019-07-17 (×4): 10 mg via ORAL
  Filled 2019-07-14 (×4): qty 1

## 2019-07-14 MED ORDER — ACETAMINOPHEN 325 MG PO TABS
650.0000 mg | ORAL_TABLET | Freq: Four times a day (QID) | ORAL | Status: DC | PRN
  Administered 2019-07-14 – 2019-07-15 (×4): 650 mg via ORAL
  Filled 2019-07-14 (×5): qty 2

## 2019-07-14 MED ORDER — ONDANSETRON HCL 4 MG/2ML IJ SOLN: 4.0000 mg | Freq: Four times a day (QID) | INTRAMUSCULAR | Status: DC | PRN

## 2019-07-14 MED ORDER — NADOLOL 20 MG PO TABS: 10.0000 mg | ORAL_TABLET | Freq: Two times a day (BID) | ORAL | Status: DC

## 2019-07-14 MED ORDER — ROSUVASTATIN CALCIUM 5 MG PO TABS
10.0000 mg | ORAL_TABLET | Freq: Every day | ORAL | Status: DC
  Administered 2019-07-14 – 2019-07-17 (×4): 10 mg via ORAL
  Filled 2019-07-14 (×4): qty 2

## 2019-07-14 MED ORDER — LEVOTHYROXINE SODIUM 50 MCG PO TABS
50.0000 ug | ORAL_TABLET | Freq: Every day | ORAL | Status: DC
  Administered 2019-07-15 – 2019-07-17 (×3): 50 ug via ORAL
  Filled 2019-07-14 (×3): qty 1

## 2019-07-14 MED ORDER — ACETAMINOPHEN 650 MG RE SUPP: 650.0000 mg | Freq: Four times a day (QID) | RECTAL | Status: DC | PRN

## 2019-07-14 MED ORDER — SODIUM CHLORIDE 0.9 % IV BOLUS
1000.0000 mL | Freq: Once | INTRAVENOUS | Status: AC
  Administered 2019-07-14: 1000 mL via INTRAVENOUS

## 2019-07-14 MED ORDER — HEPARIN SODIUM (PORCINE) 5000 UNIT/ML IJ SOLN
5000.0000 [IU] | Freq: Three times a day (TID) | INTRAMUSCULAR | Status: DC
  Administered 2019-07-14 – 2019-07-17 (×9): 5000 [IU] via SUBCUTANEOUS
  Filled 2019-07-14 (×9): qty 1

## 2019-07-14 MED ORDER — SODIUM CHLORIDE 0.9 % IV SOLN: INTRAVENOUS | Status: AC

## 2019-07-14 MED ORDER — ONDANSETRON HCL 4 MG PO TABS: 4.0000 mg | ORAL_TABLET | Freq: Four times a day (QID) | ORAL | Status: DC | PRN

## 2019-07-14 NOTE — Consult Note (Signed)
[facility] for Infectious Disease    Date of Admission:  07/13/2019      Total days of antibiotics         Reason for Consult: ? Neurotoxicity to Cefepime     Referring Provider: Dr. Baxter Flattery  Primary Care Provider: Burnett Sheng, MD     Assessment: Stacy Sanchez is a 74 y.o. female patient of our partner Dr. Baxter Flattery whom was on treatment with IV cefepime at home for treatment of pseudomonal bronchiectasis flare. She has had excellent improvement after 3 weeks of therapy.  This does not need further treatment at this time.   Debarah noted new and abrupt onset involuntary myoclonic movements to upper body, stuttering, ataxia and short term memory difficulties. In light of her acute drop in creatinine clearance (15 mL/min) this would absolutely put her at higher risk for neurologic side effects to Cefepime.  Her myoclonic jerking seems to fit with this as well as most of her other symptoms.  Does not seem to be having any definitive seizure activity at this time from her and her daughter's description.  I will look into what her recent Creatinine was at last lab check outpatient;  I suspect she has been receiving a dose out of proportion to her recent acute kidney changes.  If these neurologic changes are due to the medication we should see resolution as she has time to clear it.   Unclear as to the cause of the AKI - no medications that would increase risk, no NSAID use, no signs of significant dehydration. Would not normally associate cefepime as a nephrotoxic drug, it is described on a very rare occurrence to cause interstitial nephritis. Would continue work up per admitting team to look at other considerations that would be more common to explain the abrupt change. Ultrasound pending.     Plan: 1. Observe off antibiotics  2. Renal work up per primary team  3. If no improvement in CNS symptoms off Cefepime as her kidney function improves, would recommend neurology  evaluation    Principal Problem:   Acute renal failure superimposed on stage 3 chronic kidney disease ([place]) Active Problems:   Bronchiectasis (HCC)   CAD (coronary artery disease)   Generalized anxiety disorder   Chronic respiratory failure with hypoxia (HCC)   Thrombocytopenia (HCC)   Hypothyroidism   Acute metabolic encephalopathy   Myoclonus   . heparin  5,000 Units Subcutaneous Q8H  . levothyroxine  50-100 mcg Oral See admin instructions  . nadolol  10 mg Oral Daily  . rosuvastatin  10 mg Oral Daily  . sodium chloride HYPERTONIC  4 mL Nebulization BID    HPI: Stacy Sanchez is a 74 y.o. female who presented to the ER from home after her PCP recommended she go for evaluation of new onset jerking upper body movements, confusion, stuttering speech.   She states that she first noticed jerking and slurring of words the last 3 days of her antibiotic course. She has felt very "fuzzy headed" and has noticed progressively more difficulty with short term memory as well as "getting lost" when it came to figuring out destinations. She has also started dropping items, not due to weakness but the jerking "throws things" out of her hands. She feels that she may have some blurry vision but it may be due to inability to read words clearly with shaking/jerking movements. She has never to her knowledge experienced a loss of consciousness. Her daughter, Dory Larsen  also describes that she has had some trouble with gait and stumbling lately as well. No falls but nearly has had a few.   Regarding her pseudomonal bronchiectasis flare - she states that she was much improved and back to baseline. Now cough is described to be infrequent and dry. No further oxygen needs or shortness of breath like she experienced prior to cefepime. Last dose was 3/22 and her PICC line has since been removed. Site is unremarkable.   She has had no new medications other than Cefepime. Specifically denies NSAIDs use --> only takes  Tylenol as needed but uses very infrequently. She does not eat or drink much normally but feels that she has had less than she normally would the last week or so. No vomiting or diarrhea. No abdominal pain. No difficulty passing urine and states she is voiding as she normally expects. She has had no swelling.    Review of Systems: Review of Systems  All other systems reviewed and are negative.   Past Medical History:  Diagnosis Date  . Arthritis   . Hypertension   . Mycobacterium abscessus infection   . Mycobacterium avium complex ([place])   . Pseudomonas respiratory infection   . Thyroid disease     Social History   Tobacco Use  . Smoking status: Never Smoker  . Smokeless tobacco: Never Used  Substance Use Topics  . Alcohol use: No  . Drug use: No    Family History  Problem Relation Age of Onset  . Thyroid disease Daughter    Allergies  Allergen Reactions  . Amikacin Other (See Comments)    nephropathy   . Ethambutol Other (See Comments)    neuropathy  . Levaquin [Levofloxacin] Other (See Comments)    Fainting and weakness  . Parafon Forte Dsc [Chlorzoxazone] Hives  . Rifabutin Diarrhea  . Tequin [Gatifloxacin] Other (See Comments)    Fainting and weakness  . Zyvox [Linezolid] Diarrhea    OBJECTIVE: Blood pressure (!) 185/93, pulse 80, temperature 98.2 F (36.8 C), temperature source Oral, resp. rate 17, height _0  (1.702 m), weight 65 kg, SpO2 100 %.  Physical Exam Constitutional:      Appearance: Normal appearance. She is not ill-appearing.     Comments: Sitting upright on stretcher. Comfortable overall but tired appearing.   HENT:     Mouth/Throat:     Mouth: Mucous membranes are moist.     Pharynx: Oropharynx is clear. No oropharyngeal exudate.  Eyes:     General: No scleral icterus.    Extraocular Movements: Extraocular movements intact.     Pupils: Pupils are equal, round, and reactive to light.  Cardiovascular:     Rate and Rhythm: Normal rate  and regular rhythm.     Pulses: Normal pulses.     Heart sounds: Normal heart sounds.  Pulmonary:     Effort: Pulmonary effort is normal. No respiratory distress.     Breath sounds: No rhonchi.     Comments: Posterior fibrotic crackles noted L>R Abdominal:     General: Bowel sounds are normal. There is no distension.  Musculoskeletal:     Cervical back: Normal range of motion. No tenderness.  Skin:    General: Skin is warm and dry.     Capillary Refill: Capillary refill takes less than 2 seconds.     Findings: No erythema or rash.  Neurological:     Mental Status: She is alert.     Motor: No weakness.  Coordination: Coordination abnormal.     Gait: Gait abnormal (Shuffled gait described ).     Comments: Oriented x 3 but states she has had intermittent trouble for almost a week now  Psychiatric:        Mood and Affect: Mood normal.        Behavior: Behavior normal.        Judgment: Judgment normal.     Lab Results Lab Results  Component Value Date   WBC 6.0 07/14/2019   HGB 11.8 (L) 07/14/2019   HCT 38.3 07/14/2019   MCV 97.7 07/14/2019   PLT 112 (L) 07/14/2019    Lab Results  Component Value Date   CREATININE 3.11 (H) 07/14/2019   BUN 44 (H) 07/14/2019   NA 141 07/14/2019   K 4.7 07/14/2019   CL 103 07/14/2019   CO2 28 07/14/2019    Lab Results  Component Value Date   ALT 12 07/14/2019   AST 22 07/14/2019   ALKPHOS 50 07/14/2019   BILITOT 0.6 07/14/2019     Microbiology: Recent Results (from the past 240 hour(s))  SARS CORONAVIRUS 2 (TAT 6-24 HRS) Nasopharyngeal Nasopharyngeal Swab     Status: None   Collection Time: 07/14/19  5:29 AM   Specimen: Nasopharyngeal Swab  Result Value Ref Range Status   SARS Coronavirus 2 NEGATIVE NEGATIVE Final    Comment: (NOTE) SARS-CoV-2 target nucleic acids are NOT DETECTED. The SARS-CoV-2 RNA is generally detectable in upper and lower respiratory specimens during the acute phase of infection. Negative results do  not preclude SARS-CoV-2 infection, do not rule out co-infections with other pathogens, and should not be used as the sole basis for treatment or other patient management decisions. Negative results must be combined with clinical observations, patient history, and epidemiological information. The expected result is Negative. Fact Sheet for Patients: SugarRoll.be Fact Sheet for Healthcare Providers: https://www.woods-mathews.com/ This test is not yet approved or cleared by the [place] FDA and  has been authorized for detection and/or diagnosis of SARS-CoV-2 by FDA under an Emergency Use Authorization (EUA). This EUA will remain  in effect (meaning this test can be used) for the duration of the COVID-19 declaration under Section 56 4(b)(1) of the Act, 21 U.S.C. section 360bbb-3(b)(1), unless the authorization is terminated or revoked sooner. Performed at [facility], [place] [address], [place], [place] [address]      Janene Madeira, MSN, NP-C [facility] [email or web address] Pager: [phone] Office: [phone] [place]: [phone]

## 2019-07-14 NOTE — Progress Notes (Signed)
Physical Therapy Evaluation Patient Details Name: Stacy Sanchez MRN: [id number] DOB: 1945/10/22 Today's Date: 07/14/2019   History of Present Illness  Stacy Sanchez is a 74 y.o. female with medical history significant for bronchiectasis, chronic hypoxic respiratory failure, CAD, CKD II-IIIa, and recent exacerbation of her bronchiectasis with Pseudomonas infection treated with cefepime at home, now presenting to the emergency department with uncontrolled jerking movements, confusion, stuttering speech.  Patient completed a 3-week course of cefepime via PICC at home on the night of 07/12/2019, reports that her respiratory status has returned to baseline, but she has developed uncontrolled jerking movements, mainly in the bilateral arms, but also affecting both legs.  She saw her PCP for this yesterday, but since that appointment, has developed some mild confusion and stuttering speech that prompted her ED visit.  Pt currently presents to PT secondary to uncontrolled gait, balance deficits, and uncoordinated & involuntary movements.  Clinical Impression  PTA patient was fully independent with functional mobility, being able to walk dog and pick up feces multiple times a day. Pt required no assistance with ADLs and IADLs. Pt currently has access to all needed equipment other than RW (which is recommended due to current balance). She demonstrates high fall risk without AD due to uncontrolled movements causing near LOB x 3 during ambulation & room mobility.  Pt demonstrated controlled but slow movements when attempting coordination tasks (alt foot tap/flipping hands over) for UE & LE. Pt states she has a recent episode of attending PT for the last 3 months but was plateau with reduced ability to perform sit to stand with continued LE weakness.  Pt states having prior LB surgery and numbness in LLE secondary to adverse medication reactions. Pt grossly 4-/5 LE with main limiting factor being uncontrolled movements  today. Discussed at length, exercise progression, intensity, and exercise physiology of muscles and need for consistency for progress to be made. Pt verbalized understanding. Due to patient wanting to return home and severe uncoordinated movements/balance deficits currently recommend HHPT (due to danger with driving currently) and acute PT to follow until transition to next level of care.    Follow Up Recommendations Home health PT;Supervision - Intermittent;Other (comment)(RW use when up)    Equipment Recommendations  Rolling walker with 5" wheels    Recommendations for Other Services       Precautions / Restrictions Precautions Precautions: Fall Restrictions Weight Bearing Restrictions: No      Mobility  Bed Mobility Overal bed mobility: Needs Assistance Bed Mobility: Supine to Sit;Sit to Supine     Supine to sit: Supervision Sit to supine: Supervision   General bed mobility comments: Pt demonstrates fair bed mobility with VCs required for sequencing  Transfers Overall transfer level: Needs assistance Equipment used: Rolling walker (2 wheeled);None Transfers: Sit to/from Stand Sit to Stand: Min guard;Supervision         General transfer comment: Pt demonstrated need for CGA for all transfers today due to involuntary movements in LE & UE.   Ambulation/Gait Ambulation/Gait assistance: Min assist;Min guard Gait Distance (Feet): 225 Feet Assistive device: Rolling walker (2 wheeled) Gait Pattern/deviations: Step-through pattern;Ataxic Gait velocity: near community Gait velocity interpretation: >2.62 ft/sec, indicative of community ambulatory General Gait Details: Intermittent ataxic gait no LOB evident with RW      Balance Overall balance assessment: Needs assistance Sitting-balance support: No upper extremity supported Sitting balance-Leahy Scale: Good     Standing balance support: Bilateral upper extremity supported Standing balance-Leahy Scale: Poor Standing  balance comment: reliant on UE  support to keep balance due to ataxic movements          Pertinent Vitals/Pain Pain Assessment: 0-10 Pain Score: 4  Pain Location: Headache Pain Descriptors / Indicators: Aching Pain Intervention(s): Monitored during session;Limited activity within patient's tolerance    Home Living Family/patient expects to be discharged to:: Private residence Living Arrangements: Alone(dog (labordoodle)) Available Help at Discharge: Available PRN/intermittently;Home health;Neighbor(Often stays at sister in [place] when warrent) Type of Home: Other(Comment)(town home) Home Access: Level entry     Home Layout: Two level;Able to live on main level with bedroom/bathroom Home Equipment: Gilmer Mor - single point;Cane - quad;Bedside commode;Shower seat;Grab bars - toilet;Grab bars - tub/shower(3-in-1) Additional Comments: Plans to borrow RW from friend.    Prior Function Level of Independence: Independent(No AD)    Comments: Good reactive balance, has large dog, able to pick up feces etc. Worked with PT jan, feb, march due to balance & LE strength     Hand Dominance   Dominant Hand: Right    Extremity/Trunk Assessment   Upper Extremity Assessment Upper Extremity Assessment: Overall WFL for tasks assessed;Defer to OT evaluation;RUE deficits/detail;LUE deficits/detail RUE Coordination: decreased fine motor;decreased gross motor LUE Coordination: decreased fine motor;decreased gross motor    Lower Extremity Assessment Lower Extremity Assessment: Generalized weakness;RLE deficits/detail;LLE deficits/detail RLE Deficits / Details: Grossly 4-/5 RLE Coordination: decreased fine motor LLE Deficits / Details: Grossly 4-/5 LLE Coordination: decreased fine motor       Communication   Communication: Expressive difficulties  Cognition Arousal/Alertness: Awake/alert Behavior During Therapy: WFL for tasks assessed/performed Overall Cognitive Status: Within Functional  Limits for tasks assessed         General Comments: Difficulty stuttering due to medication changes      General Comments General comments (skin integrity, edema, etc.): VSS throughout tx with no lightheadness or dizziness reported. Only ataxic movements    Exercises Other Exercises Other Exercises: Spoke to patient about expectations with therapy, progression, specific goals, intensity of training based on current levels Other Exercises: Transition to independent living   Assessment/Plan    PT Assessment Patient needs continued PT services  PT Problem List Decreased strength;Decreased activity tolerance;Decreased balance;Decreased mobility;Decreased coordination;Decreased safety awareness       PT Treatment Interventions Balance training;Therapeutic activities;Therapeutic exercise;Functional mobility training;Gait training;DME instruction;Stair training;Neuromuscular re-education;Patient/family education    PT Goals (Current goals can be found in the Care Plan section)  Acute Rehab PT Goals Patient Stated Goal: Return home to live independently PT Goal Formulation: With patient/family Time For Goal Achievement: 07/28/19 Potential to Achieve Goals: Good    Frequency Min 5X/week   Barriers to discharge Decreased caregiver support Pt has neighbors and family that can help/live with if needed       AM-PAC PT "6 Clicks" Mobility  Outcome Measure Help needed turning from your back to your side while in a flat bed without using bedrails?: None Help needed moving from lying on your back to sitting on the side of a flat bed without using bedrails?: None Help needed moving to and from a bed to a chair (including a wheelchair)?: A Little Help needed standing up from a chair using your arms (e.g., wheelchair or bedside chair)?: A Little Help needed to walk in hospital room?: A Little Help needed climbing 3-5 steps with a railing? : A Lot 6 Click Score: 19    End of Session  Equipment Utilized During Treatment: Gait belt Activity Tolerance: Patient tolerated treatment well Patient left: in bed;with family/visitor present;with call bell/phone within  reach Nurse Communication: Mobility status PT Visit Diagnosis: Unsteadiness on feet (R26.81);History of falling (Z91.81);Muscle weakness (generalized) (M62.81);Ataxic gait (R26.0)    Time: 4383-8184 PT Time Calculation (min) (ACUTE ONLY): 51 min   Charges:   PT Evaluation $PT Eval High Complexity: 1 High PT Treatments $Therapeutic Activity: 8-22 mins        Ann Held PT, DPT Acute Rehab [facility] P: [phone]   Mishal Probert A Tima Curet 07/14/2019, 5:54 PM

## 2019-07-14 NOTE — Progress Notes (Signed)
Patient arrived to 6N30 from ED with daughter at bedside, AOx4, denies pain, VSS, oriented to room, use of call light and bed controls, informed about visitation policy. Will continue to monitor patient.

## 2019-07-14 NOTE — ED Notes (Signed)
Admitting Provider at bedside. 

## 2019-07-14 NOTE — Telephone Encounter (Signed)
Received call from patient's daughter letting Dr Drue Second know that Stacy Sanchez is at [facility] ER, will be admitted, for possible reaction to the antibiotics. She is just waiting on a bed. Will 'cc Dr Orvan Falconer as well. Andree Coss, RN

## 2019-07-14 NOTE — Progress Notes (Signed)
PROGRESS NOTE    Stacy Sanchez  ZYY:[id number] DOB: 01-14-1946 DOA: 07/13/2019 PCP: Glenna Durand, MD    Brief Narrative:  [phone] with hx CAD, CKD 2-3a and recent exacerbation of bronchiectasis with Pseudomonias, completing course of cefepime. Presented with confusion, stuttering words, and jerking movements. Found to have ARF with Cr of 3.11. Pt admitted for further work up  Assessment & Plan:   Principal Problem:   Acute renal failure superimposed on stage 3 chronic kidney disease (HCC) Active Problems:   Bronchiectasis (HCC)   CAD (coronary artery disease)   Generalized anxiety disorder   Chronic respiratory failure with hypoxia (HCC)   Thrombocytopenia (HCC)   Hypothyroidism   Acute metabolic encephalopathy   Myoclonus  1. Acute kidney injury superimposed on CKD II-IIIa   - SCr is 3.11 on admission, up from 1.16 on 07/05/19  - Potassium and bicarbonate are normal  - She denies N/V/D or loss of appetite and is not hypotensive; she avoids NSAIDs and not on ACE/ARB   - Renal US reviewed. Finding of medical renal disease with moderately distended bladder -Will check bladder US and I/o cath if >300cc -Cont to hold abx per ID recs, appreciate input -Repeat bmet in AM  2. Encephalopathy; myoclonus  - Jerking movements involving bilateral arms mainly but also b/l LE's began 2-3 days ago, stuttering speech and mild confusion began over the past 24 hours  - She completed a course of cefepime on 3/22  - Hold gabapentin and Lexapro, continue IVF hydration and eval/tx of AKI as above,  - Ammonia normal - Repeat bmet in AM. If no improvement despite improving renal function ,consider formal Neuro consult   3. Thrombocytopenia  - Platelets 112k on admission  - No bleeding, no infectious s/s  - Recheck cbc in AM  4. Bronchiectasis; chronic hypoxic respiratory failure  - Back to baseline per patient following recent treatment of pseudomonal infection - Continue neb treatments and  supplemental O2  - ID following, appreciate input. Recommendation to cont to hold abx for now  5. Hypertension  - BP elevated in ED - Will continue with nadolol with renal dosing and use hydralazine as needed   - BP currently stable  DVT prophylaxis: Heparin subq Code Status: Full Family Communication: Pt in room, family at bedside Disposition Plan: From home, plan HHPT when renal function normalizes  Consultants:   ID  Procedures:     Antimicrobials: Anti-infectives (From admission, onward)   None       Subjective: Reports feeling somewhat better. Weak in legs when walking to bathroom. Still feeling a little confused  Objective: Vitals:   07/14/19 1430 07/14/19 1445 07/14/19 1515 07/14/19 1557  BP: (!) 157/84 (!) 171/91 (!) 167/90 (!) 161/93  Pulse: 72 68 71 74  Resp: 14 19 (!) 28 20  Temp:    99.1 F (37.3 C)  TempSrc:    Oral  SpO2: 95% 96% 100% 100%  Weight:      Height:        Intake/Output Summary (Last 24 hours) at 07/14/2019 1852 Last data filed at 07/14/2019 1622 Gross per 24 hour  Intake 1566.67 ml  Output --  Net 1566.67 ml   Filed Weights   07/13/19 2351  Weight: 65 kg    Examination:  General exam: Appears calm and comfortable  Respiratory system: Clear to auscultation. Respiratory effort normal. Cardiovascular system: S1 & S2 heard, Regular Gastrointestinal system: Abdomen is nondistended, pos BS Central nervous system: Alert and oriented. No  focal neurological deficits. Extremities: Symmetric 5 x 5 power. Skin: No rashes, lesions Psychiatry: Judgement and insight appear normal. Mood & affect appropriate.   Data Reviewed: I have personally reviewed following labs and imaging studies  CBC: Recent Labs  Lab 07/14/19 0002  WBC 6.0  NEUTROABS 3.3  HGB 11.8*  HCT 38.3  MCV 97.7  PLT 250*   Basic Metabolic Panel: Recent Labs  Lab 07/14/19 0002  NA 141  K 4.7  CL 103  CO2 28  GLUCOSE 108*  BUN 44*  CREATININE 3.11*   CALCIUM 8.6*   GFR: Estimated Creatinine Clearance: 15.2 mL/min (A) (by C-G formula based on SCr of 3.11 mg/dL (H)). Liver Function Tests: Recent Labs  Lab 07/14/19 0002  AST 22  ALT 12  ALKPHOS 50  BILITOT 0.6  PROT 6.9  ALBUMIN 2.9*   No results for input(s): LIPASE, AMYLASE in the last 168 hours. Recent Labs  Lab 07/14/19 0510  AMMONIA 24   Coagulation Profile: Recent Labs  Lab 07/14/19 1628  INR 1.1   Cardiac Enzymes: No results for input(s): CKTOTAL, CKMB, CKMBINDEX, TROPONINI in the last 168 hours. BNP (last 3 results) No results for input(s): PROBNP in the last 8760 hours. HbA1C: No results for input(s): HGBA1C in the last 72 hours. CBG: No results for input(s): GLUCAP in the last 168 hours. Lipid Profile: No results for input(s): CHOL, HDL, LDLCALC, TRIG, CHOLHDL, LDLDIRECT in the last 72 hours. Thyroid Function Tests: No results for input(s): TSH, T4TOTAL, FREET4, T3FREE, THYROIDAB in the last 72 hours. Anemia Panel: Recent Labs    07/14/19 1628  RETICCTPCT 0.4   Sepsis Labs: No results for input(s): PROCALCITON, LATICACIDVEN in the last 168 hours.  Recent Results (from the past 240 hour(s))  SARS CORONAVIRUS 2 (TAT 6-24 HRS) Nasopharyngeal Nasopharyngeal Swab     Status: None   Collection Time: 07/14/19  5:29 AM   Specimen: Nasopharyngeal Swab  Result Value Ref Range Status   SARS Coronavirus 2 NEGATIVE NEGATIVE Final    Comment: (NOTE) SARS-CoV-2 target nucleic acids are NOT DETECTED. The SARS-CoV-2 RNA is generally detectable in upper and lower respiratory specimens during the acute phase of infection. Negative results do not preclude SARS-CoV-2 infection, do not rule out co-infections with other pathogens, and should not be used as the sole basis for treatment or other patient management decisions. Negative results must be combined with clinical observations, patient history, and epidemiological information. The expected result is  Negative. Fact Sheet for Patients: SugarRoll.be Fact Sheet for Healthcare Providers: https://www.woods-mathews.com/ This test is not yet approved or cleared by the [place] FDA and  has been authorized for detection and/or diagnosis of SARS-CoV-2 by FDA under an Emergency Use Authorization (EUA). This EUA will remain  in effect (meaning this test can be used) for the duration of the COVID-19 declaration under Section 56 4(b)(1) of the Act, 21 U.S.C. section 360bbb-3(b)(1), unless the authorization is terminated or revoked sooner. Performed at [facility], [place] [address] [place], [place] [address]      Radiology Studies: US RENAL  Result Date: 07/14/2019 CLINICAL DATA:  Acute kidney injury EXAM: RENAL / URINARY TRACT ULTRASOUND COMPLETE COMPARISON:  None. FINDINGS: Right Kidney: Renal measurements: 9.3 x 4.0 x 5.1 cm = volume: 98 mL. Increased renal parenchymal echogenicity. 1.5 cm upper pole cyst. Additional 0.7 cm midpole cyst. No solid renal lesion, shadowing stone, or hydronephrosis. Left Kidney: Renal measurements: 10.1 x 4.8 x 3.4 cm = volume: 85 mL. Increased  renal parenchymal echogenicity. 5 mm shadowing calcification of the lower pole. 0.9 cm midpole cyst. Additional 1.4 cm lower pole left renal cyst. No solid renal lesion or hydronephrosis. Bladder: Moderately distended. Small diverticulum of the posterior bladder wall measuring approximately 1.5 cm. Somewhat irregular appearance of the bladder wall contour. No intraluminal abnormality seen. Other: None. IMPRESSION: 1. A single nonobstructing 5 mm left renal stone was seen. Otherwise, no evidence of obstructive uropathy. 2. Findings of chronic medical renal disease. 3. Posterior bladder wall diverticulum. 4. Somewhat irregular bladder wall contour, which is not well evaluated sonographically. Trabeculated bladder can be seen in the setting of neurogenic bladder or bladder outlet  obstruction. Electronically Signed   By: Duanne Guess D.O.   On: 07/14/2019 12:10    Scheduled Meds: . heparin  5,000 Units Subcutaneous Q8H  . [START ON 07/15/2019] levothyroxine  50 mcg Oral Q0600  . nadolol  10 mg Oral Daily  . rosuvastatin  10 mg Oral Daily  . sodium chloride HYPERTONIC  4 mL Nebulization BID   Continuous Infusions: . sodium chloride 100 mL/hr at 07/14/19 1042     LOS: 0 days   Rickey Barbara, MD [facility] Pager On Amion  If 7PM-7AM, please contact night-coverage 07/14/2019, 6:52 PM

## 2019-07-14 NOTE — ED Provider Notes (Signed)
[facility] EMERGENCY DEPARTMENT Provider Note   CSN: [id number] Arrival date & time: 07/13/19  2305     History Chief Complaint  Patient presents with  . Medication Side Effect    Stacy Sanchez is a 74 y.o. female.  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  Patient reports for the last week she has noted tremulousness.  She states she has dropped multiple things with both of her hands.  Her symptoms do not seem to lateralize.  At times she states that she staggers when she walks and just yesterday began to stutter.  Patient also reports some word finding difficulty.  She states that she was riding her home town and could not finish writing it.  She was aware that she could not find the word.  She finished her last injection of cefepime on Monday night.  She had her PICC line removed.  She is not had any fevers.  She reports good urine output.  At baseline she is on as needed oxygen.  She saw her primary doctor yesterday who suggested that some of her symptoms may be related to cefepime toxicity.  He recommended that she not drive and follow-up with her infectious disease doctor.  If not improving, she may need to see neurology.  Patient seen by Dr. Carlyle Basques.   Chart reviewed.  She finished a course of cefepime on Monday for Pseudomonas.  She is followed by Dr. Baxter Flattery.  Past Medical History:  Diagnosis Date  . Arthritis   . Hypertension   . Mycobacterium abscessus infection   . Mycobacterium avium complex ([place])   . Pseudomonas respiratory infection   . Thyroid disease     Patient Active Problem List   Diagnosis Date Noted  . Chronic respiratory failure with hypoxia ([place]) 12/07/2018  . Acute respiratory failure with  hypoxia ([place]) 11/27/2018  . Bronchiectasis with acute exacerbation ([place]) 11/27/2018  . 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 Thrush 11/27/2018  . CAD (coronary artery disease) 11/27/2018  . CKD (chronic kidney disease), stage III 11/27/2018  . Essential hypertension 11/27/2018  . Generalized anxiety disorder 11/27/2018  . Peripheral neuropathy 11/27/2018  . Mycobacterium avium complex ([place]) 11/27/2018    Past Surgical History:  Procedure Laterality Date  . ABDOMINAL HYSTERECTOMY    . BRONCHOSCOPY  2001  . CARDIAC SURGERY    . COLONOSCOPY  2008  . CYST REMOVAL TRUNK  1965   Left axilla; benign  . DILATION AND CURETTAGE OF UTERUS  1975  . HAND SURGERY Right 1983  . PATENT DUCTUS ARTERIOUS REPAIR  1952  . TONSILLECTOMY  1956  . [place]     OB History   No obstetric history on file.     Family History  Problem Relation Age of Onset  . Thyroid disease Daughter     Social History   Tobacco Use  . Smoking status: Never Smoker  . Smokeless tobacco: Never Used  Substance Use Topics  . Alcohol use: No  . Drug use: No    Home Medications Prior to Admission medications   Medication Sig Start Date End Date Taking? Authorizing Provider  acetaminophen (TYLENOL) 650 MG CR tablet Take 1,300 mg by mouth every 8 (eight) hours as needed for pain.  [provider]  aspirin EC 81 MG tablet Take 81 mg by mouth daily.    [provider]  escitalopram (LEXAPRO) 10 MG tablet Take 10 mg by mouth daily. 04/12/19   [provider]  gabapentin (NEURONTIN) 300 MG capsule Take 300 mg by mouth 2 (two) times daily.    [provider]  ipratropium-albuterol (DUONEB) 0.5-2.5 (3) MG/3ML SOLN Take 3 mLs by nebulization 3 (three) times daily as needed. Patient taking differently: Take 3 mLs by nebulization in the morning and at bedtime.  02/08/19   Martyn Ehrich, NP  levothyroxine (SYNTHROID, LEVOTHROID) 50 MCG tablet Take  50-100 mcg by mouth See admin instructions. Take 50 mcg daily on Monday through Saturday and take 100 mcg on Sunday    [provider]  Multiple Vitamin (MULTIVITAMIN WITH MINERALS) TABS tablet Take 1 tablet by mouth daily.    [provider]  nadolol (CORGARD) 20 MG tablet Take 10 mg by mouth 2 (two) times daily.     [provider]  Nebulizers (COMPRESSOR/NEBULIZER) MISC 1 Product by Does not apply route once for 1 dose. 12/01/18 06/18/19  Swayze, Ava, DO  polyethylene glycol powder (GLYCOLAX/MIRALAX) 17 GM/SCOOP powder Take 17 g by mouth daily as needed for moderate constipation.  07/13/09   [provider]  Probiotic CAPS Take 1 capsule by mouth daily.    [provider]  rosuvastatin (CRESTOR) 10 MG tablet Take 10 mg by mouth daily. 10/19/18   [provider]  sodium chloride HYPERTONIC 3 % nebulizer solution Take 4 mLs by nebulization 2 (two) times daily as needed for other. 06/21/19   Chesley Mires, MD    Allergies    Amikacin, Ethambutol, Levaquin [levofloxacin], Parafon forte dsc [chlorzoxazone], Rifabutin, Tequin [gatifloxacin], and Zyvox [linezolid]  Review of Systems   Review of Systems  Constitutional: Negative for fever.  Respiratory: Negative for shortness of breath.   Cardiovascular: Negative for chest pain.  Gastrointestinal: Negative for abdominal pain, nausea and vomiting.  Neurological: Positive for tremors, speech difficulty and weakness.  Psychiatric/Behavioral: Positive for confusion.  All other systems reviewed and are negative.   Physical Exam Updated Vital Signs BP (!) 169/84 (BP Location: Right Arm)   Pulse 67   Temp 98.2 F (36.8 C) (Oral)   Resp 16   Ht 1.702 m (_0 )   Wt 65 kg   SpO2 96%   BMI 22.44 kg/m   Physical Exam Vitals and nursing note reviewed.  Constitutional:      Appearance: She is well-developed. She is not ill-appearing.     Comments: Anxious appearing but nontoxic  HENT:     Head:  Normocephalic and atraumatic.     Mouth/Throat:     Mouth: Mucous membranes are moist.  Eyes:     Pupils: Pupils are equal, round, and reactive to light.  Cardiovascular:     Rate and Rhythm: Normal rate and regular rhythm.     Heart sounds: Normal heart sounds.  Pulmonary:     Effort: Pulmonary effort is normal. No respiratory distress.     Breath sounds: Rales present. No wheezing.     Comments: Nasal cannula in place, no respiratory distress Abdominal:     General: Bowel sounds are normal.     Palpations: Abdomen is soft.     Tenderness: There is no abdominal tenderness.  Musculoskeletal:     Cervical back: Neck supple.     Right lower leg: No edema.     Left  lower leg: No edema.  Skin:    General: Skin is warm and dry.  Neurological:     Mental Status: She is alert and oriented to person, place, and time.     Comments: Occasional word finding difficulty with stuttering noted on exam, cranial nerves II through XII intact, no tremor but some dysmetria noted bilaterally with finger-nose-finger, gait testing deferred, patient can name and repeat, she is oriented  Psychiatric:     Comments: Anxious     ED Results / Procedures / Treatments   Labs (all labs ordered are listed, but only abnormal results are displayed) Labs Reviewed  CBC WITH DIFFERENTIAL/PLATELET - Abnormal; Notable for the following components:      Result Value   Hemoglobin 11.8 (*)    Platelets 112 (*)    All other components within normal limits  COMPREHENSIVE METABOLIC PANEL - Abnormal; Notable for the following components:   Glucose, Bld 108 (*)    BUN 44 (*)    Creatinine, Ser 3.11 (*)    Calcium 8.6 (*)    Albumin 2.9 (*)    GFR calc non Af Amer 14 (*)    GFR calc Af Amer 16 (*)    All other components within normal limits  URINALYSIS, ROUTINE W REFLEX MICROSCOPIC  AMMONIA    EKG None  Radiology No results found.  Procedures Procedures (including critical care time)  Medications  Ordered in ED Medications  sodium chloride 0.9 % bolus 1,000 mL (has no administration in time range)    ED Course  I have reviewed the triage vital signs and the nursing notes.  Pertinent labs & imaging results that were available during my care of the patient were reviewed by me and considered in my medical decision making (see chart for details).    MDM Rules/Calculators/A&P                       Patient presents with confusion, word finding difficulty, staggering and stuttering.  Recent diagnosis of Pseudomonas for which she received a 3-week course of cefepime.  She is overall nontoxic and afebrile.  She does have some obvious stuttering and word finding difficulty on exam.  Her neurologic exam does not seem to lateralize.  Lab work reviewed from triage.  She has acute kidney injury with a creatinine greater than 3.  Baseline closer to 1.  Additionally she has some uremia with a BUN of 44.  However, I would not expect this to cause her symptoms at that level.  I have reviewed side effects from cefepime and neurotoxicity has been reported including encephalopathy, aphasia, myoclonus.  It also appears that this can be exacerbated by kidney injury.  This is certainly consideration.  I did run the patient by Dr. Rory Percy, neurology.  He states that neurotoxicity has been seen with cefepime in the past.  If her symptoms are not lateralizing, he would recommend admission for hydration to see if symptoms improve.  If not, she may need imaging.  Patient was given 1 L of fluids.  I have added urinalysis to rule out additional infectious cause and ammonia level.  Will discuss with hospitalist for admission.  Covid testing pending.  Final Clinical Impression(s) / ED Diagnoses Final diagnoses:  AKI (acute kidney injury) ([place])  Uremia  Altered mental status, unspecified altered mental status type    Rx / DC Orders ED Discharge Orders    None       Lux Meaders, Barbette Hair,  MD 07/14/19 6222

## 2019-07-14 NOTE — H&P (Signed)
History and Physical    Stacy Sanchez UVO:[id number] DOB: 1945-11-23 DOA: 07/13/2019  PCP: Burnett Sheng, MD   Patient coming from: Home   Chief Complaint: Uncontrolled jerking, stuttering speech, confusion   HPI: Stacy Sanchez is a 74 y.o. female with medical history significant for bronchiectasis, chronic hypoxic respiratory failure, CAD, CKD II-IIIa, and recent exacerbation of her bronchiectasis with Pseudomonas infection treated with cefepime at home, now presenting to the emergency department with uncontrolled jerking movements, confusion, stuttering speech.  Patient completed a 3-week course of cefepime via PICC at home on the night of 07/12/2019, reports that her respiratory status has returned to baseline, but she has developed uncontrolled jerking movements, mainly in the bilateral arms, but also affecting both legs.  She saw her PCP for this yesterday, but since that appointment, has developed some mild confusion and stuttering speech that prompted her ED visit.  She denies any recent fevers, chills, neck stiffness, or photophobia.  She had a mild headache yesterday that resolved with a dose of Tylenol.  She denies any recent vomiting, diarrhea, or loss of appetite.  She does not use NSAIDs and, aside from the cefepime, reports no new medications.  ED Course: Upon arrival to the ED, patient is found to be afebrile, saturating mid 90s on 2 L/min of supplemental oxygen, and with stable blood pressure.  Chemistry panel notable for BUN of 44 and creatinine of 3.11, up from 1.16 eight days earlier.  CBC with platelets 112,000, down from 144,000 earlier this month.  ED physician discussed the case with neurology who did not feel that imaging was indicated at this time but agreed with medical admission with evaluation and management of her acute renal failure and ongoing evaluation of the neurologic complaints.  Review of Systems:  All other systems reviewed and apart from HPI, are  negative.  Past Medical History:  Diagnosis Date  . Arthritis   . Hypertension   . Mycobacterium abscessus infection   . Mycobacterium avium complex ([place])   . Pseudomonas respiratory infection   . Thyroid disease     Past Surgical History:  Procedure Laterality Date  . ABDOMINAL HYSTERECTOMY    . BRONCHOSCOPY  2001  . CARDIAC SURGERY    . COLONOSCOPY  2008  . CYST REMOVAL TRUNK  1965   Left axilla; benign  . DILATION AND CURETTAGE OF UTERUS  1975  . HAND SURGERY Right 1983  . PATENT DUCTUS ARTERIOUS REPAIR  1952  . TONSILLECTOMY  1956  . [place]     reports that she has never smoked. She has never used smokeless tobacco. She reports that she does not drink alcohol or use drugs.  Allergies  Allergen Reactions  . Amikacin Other (See Comments)    nephropathy   . Ethambutol Other (See Comments)    neuropathy  . Levaquin [Levofloxacin] Other (See Comments)    Fainting and weakness  . Parafon [place] Dsc [Chlorzoxazone] Hives  . Rifabutin Diarrhea  . Tequin [Gatifloxacin] Other (See Comments)    Fainting and weakness  . Zyvox [Linezolid] Diarrhea    Family History  Problem Relation Age of Onset  . Thyroid disease Daughter      Prior to Admission medications   Medication Sig Start Date End Date Taking? Authorizing Provider  acetaminophen (TYLENOL) 650 MG CR tablet Take 1,300 mg by mouth every 8 (eight) hours as needed for pain.     [provider]  aspirin EC 81 MG tablet Take 81 mg  by mouth daily.    [provider]  escitalopram (LEXAPRO) 10 MG tablet Take 10 mg by mouth daily. 04/12/19   [provider]  gabapentin (NEURONTIN) 300 MG capsule Take 300 mg by mouth 2 (two) times daily.    [provider]  ipratropium-albuterol (DUONEB) 0.5-2.5 (3) MG/3ML SOLN Take 3 mLs by nebulization 3 (three) times daily as needed. Patient taking differently: Take 3 mLs by nebulization in the morning and at bedtime.  02/08/19    Martyn Ehrich, NP  levothyroxine (SYNTHROID, LEVOTHROID) 50 MCG tablet Take 50-100 mcg by mouth See admin instructions. Take 50 mcg daily on Monday through Saturday and take 100 mcg on Sunday    [provider]  Multiple Vitamin (MULTIVITAMIN WITH MINERALS) TABS tablet Take 1 tablet by mouth daily.    [provider]  nadolol (CORGARD) 20 MG tablet Take 10 mg by mouth 2 (two) times daily.     [provider]  Nebulizers (COMPRESSOR/NEBULIZER) MISC 1 Product by Does not apply route once for 1 dose. 12/01/18 06/18/19  Swayze, Ava, DO  polyethylene glycol powder (GLYCOLAX/MIRALAX) 17 GM/SCOOP powder Take 17 g by mouth daily as needed for moderate constipation.  07/13/09   [provider]  Probiotic CAPS Take 1 capsule by mouth daily.    [provider]  rosuvastatin (CRESTOR) 10 MG tablet Take 10 mg by mouth daily. 10/19/18   [provider]  sodium chloride HYPERTONIC 3 % nebulizer solution Take 4 mLs by nebulization 2 (two) times daily as needed for other. 06/21/19   Chesley Mires, MD    Physical Exam: Vitals:   07/13/19 2336 07/13/19 2351 07/14/19 0500 07/14/19 0515  BP: (!) 169/84  (!) 175/98 (!) 191/91  Pulse: 67  63 60  Resp: _0 Temp: 98.2 F (36.8 C)     TempSrc: Oral     SpO2: 96%  100% 100%  Weight:  65 kg    Height:  _1  (1.702 m)      Constitutional: NAD, calm  Eyes: PERTLA, lids and conjunctivae normal ENMT: Mucous membranes are moist. Posterior pharynx clear of any exudate or lesions.   Neck: normal, supple, no masses, no thyromegaly Respiratory: no wheezing, no crackles. No accessory muscle use.  Cardiovascular: S1 & S2 heard, regular rate and rhythm. No extremity edema.   Abdomen: No distension, no tenderness, soft. Bowel sounds active.  Musculoskeletal: no clubbing / cyanosis. No joint deformity upper and lower extremities.   Skin: no significant rashes, lesions, ulcers. Warm, dry, well-perfused. Neurologic:  CN 2-12 grossly intact. Stuttering speech. Sensation to light touch intact. Strength 5/5 in all 4 limbs. Myoclonus bilateral UEs.  Psychiatric: Alert and oriented to person, place, date, situation. Very pleasant and cooperative.    Labs and Imaging on Admission: I have personally reviewed following labs and imaging studies  CBC: Recent Labs  Lab 07/14/19 0002  WBC 6.0  NEUTROABS 3.3  HGB 11.8*  HCT 38.3  MCV 97.7  PLT 536*   Basic Metabolic Panel: Recent Labs  Lab 07/14/19 0002  NA 141  K 4.7  CL 103  CO2 28  GLUCOSE 108*  BUN 44*  CREATININE 3.11*  CALCIUM 8.6*   GFR: Estimated Creatinine Clearance: 15.2 mL/min (A) (by C-G formula based on SCr of 3.11 mg/dL (H)). Liver Function Tests: Recent Labs  Lab 07/14/19 0002  AST 22  ALT 12  ALKPHOS 50  BILITOT 0.6  PROT 6.9  ALBUMIN 2.9*  No results for input(s): LIPASE, AMYLASE in the last 168 hours. No results for input(s): AMMONIA in the last 168 hours. Coagulation Profile: No results for input(s): INR, PROTIME in the last 168 hours. Cardiac Enzymes: No results for input(s): CKTOTAL, CKMB, CKMBINDEX, TROPONINI in the last 168 hours. BNP (last 3 results) No results for input(s): PROBNP in the last 8760 hours. HbA1C: No results for input(s): HGBA1C in the last 72 hours. CBG: No results for input(s): GLUCAP in the last 168 hours. Lipid Profile: No results for input(s): CHOL, HDL, LDLCALC, TRIG, CHOLHDL, LDLDIRECT in the last 72 hours. Thyroid Function Tests: No results for input(s): TSH, T4TOTAL, FREET4, T3FREE, THYROIDAB in the last 72 hours. Anemia Panel: No results for input(s): VITAMINB12, FOLATE, FERRITIN, TIBC, IRON, RETICCTPCT in the last 72 hours. Urine analysis:    Component Value Date/Time   COLORURINE YELLOW 11/27/2018 1450   APPEARANCEUR CLEAR 11/27/2018 1450   LABSPEC 1.009 11/27/2018 1450   PHURINE 7.0 11/27/2018 1450   GLUCOSEU NEGATIVE 11/27/2018 1450   HGBUR NEGATIVE 11/27/2018 1450    BILIRUBINUR NEGATIVE 11/27/2018 1450   KETONESUR NEGATIVE 11/27/2018 1450   PROTEINUR NEGATIVE 11/27/2018 1450   NITRITE NEGATIVE 11/27/2018 1450   LEUKOCYTESUR NEGATIVE 11/27/2018 1450   Sepsis Labs: _0 (procalcitonin:4,lacticidven:4) )No results found for this or any previous visit (from the past 240 hour(s)).   Radiological Exams on Admission: No results found.  Assessment/Plan   1. 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 Encephalopathy; myoclonus  - Jerking movements involving bilateral arms mainly but also b/l LE's began 2-3 days ago, stuttering speech and mild confusion began over the past 24 hours  - She completed a course of cefepime on 3/22  - Hold gabapentin and Lexapro, continue IVF hydration and eval/tx of AKI as above, follow-up pending ammonia level, continue supportive care    3. Thrombocytopenia  - Platelets 112k on admission  - No bleeding, no infectious s/s  - Monitor with daily CBC    4. Bronchiectasis; chronic hypoxic respiratory failure  - Back to baseline per patient following recent treatment of pseudomonal infection - Continue neb treatments and supplemental O2   5. Hypertension  - BP elevated in ED, continue nadolol with renal dosing and use hydralazine as needed     DVT prophylaxis: sq heparin  Code Status: Full  Family Communication: Discussed with patient  Disposition Plan: Likely back home in 3-4 days if encephalopathy and myoclonus have significantly improved and renal function significantly improved  Consults called: None  Admission status: Inpatient     Vianne Bulls, MD [facility] Pager: See  [email or web address]  If 7AM-7PM, please contact the daytime attending [email or web address]  07/14/2019, 5:54 AM

## 2019-07-14 NOTE — ED Notes (Signed)
Patient transported to US 

## 2019-07-15 ENCOUNTER — Inpatient Hospital Stay (HOSPITAL_COMMUNITY): Payer: Medicare HMO

## 2019-07-15 DIAGNOSIS — I2510 Atherosclerotic heart disease of native coronary artery without angina pectoris: Secondary | ICD-10-CM

## 2019-07-15 DIAGNOSIS — F985 Adult onset fluency disorder: Secondary | ICD-10-CM

## 2019-07-15 DIAGNOSIS — E039 Hypothyroidism, unspecified: Secondary | ICD-10-CM

## 2019-07-15 DIAGNOSIS — R7989 Other specified abnormal findings of blood chemistry: Secondary | ICD-10-CM

## 2019-07-15 DIAGNOSIS — G9341 Metabolic encephalopathy: Secondary | ICD-10-CM

## 2019-07-15 DIAGNOSIS — F411 Generalized anxiety disorder: Secondary | ICD-10-CM

## 2019-07-15 LAB — CBC WITH DIFFERENTIAL/PLATELET
Abs Immature Granulocytes: 0.01 10*3/uL (ref 0.00–0.07)
Basophils Absolute: 0 10*3/uL (ref 0.0–0.1)
Basophils Relative: 1 %
Eosinophils Absolute: 0.3 10*3/uL (ref 0.0–0.5)
Eosinophils Relative: 7 %
HCT: 34.2 % — ABNORMAL LOW (ref 36.0–46.0)
Hemoglobin: 10.6 g/dL — ABNORMAL LOW (ref 12.0–15.0)
Immature Granulocytes: 0 %
Lymphocytes Relative: 38 %
Lymphs Abs: 1.9 10*3/uL (ref 0.7–4.0)
MCH: 29.5 pg (ref 26.0–34.0)
MCHC: 31 g/dL (ref 30.0–36.0)
MCV: 95.3 fL (ref 80.0–100.0)
Monocytes Absolute: 0.4 10*3/uL (ref 0.1–1.0)
Monocytes Relative: 8 %
Neutro Abs: 2.3 10*3/uL (ref 1.7–7.7)
Neutrophils Relative %: 46 %
Platelets: 89 10*3/uL — ABNORMAL LOW (ref 150–400)
RBC: 3.59 MIL/uL — ABNORMAL LOW (ref 3.87–5.11)
RDW: 14.5 % (ref 11.5–15.5)
WBC: 5 10*3/uL (ref 4.0–10.5)
nRBC: 0 % (ref 0.0–0.2)

## 2019-07-15 LAB — BASIC METABOLIC PANEL
Anion gap: 8 (ref 5–15)
BUN: 44 mg/dL — ABNORMAL HIGH (ref 8–23)
CO2: 25 mmol/L (ref 22–32)
Calcium: 8.1 mg/dL — ABNORMAL LOW (ref 8.9–10.3)
Chloride: 106 mmol/L (ref 98–111)
Creatinine, Ser: 2.95 mg/dL — ABNORMAL HIGH (ref 0.44–1.00)
GFR calc Af Amer: 17 mL/min — ABNORMAL LOW (ref >60)
GFR calc non Af Amer: 15 mL/min — ABNORMAL LOW (ref >60)
Glucose, Bld: 94 mg/dL (ref 70–99)
Potassium: 4.9 mmol/L (ref 3.5–5.1)
Sodium: 139 mmol/L (ref 135–145)

## 2019-07-15 LAB — PROTEIN / CREATININE RATIO, URINE
Creatinine, Urine: 18.04 mg/dL
Protein Creatinine Ratio: 1.94 mg/mg{Cre} — ABNORMAL HIGH (ref 0.00–0.15)
Total Protein, Urine: 35 mg/dL

## 2019-07-15 LAB — FERRITIN: Ferritin: 2 ng/mL — ABNORMAL LOW (ref 11–307)

## 2019-07-15 LAB — RETICULOCYTES
Immature Retic Fract: 10.1 % (ref 2.3–15.9)
RBC.: 3.91 MIL/uL (ref 3.87–5.11)
Retic Count, Absolute: 16 10*3/uL — ABNORMAL LOW (ref 19.0–186.0)
Retic Ct Pct: 0.5 % (ref 0.4–3.1)

## 2019-07-15 LAB — IRON AND TIBC
Iron: 75 ug/dL (ref 28–170)
Saturation Ratios: 24 % (ref 10.4–31.8)
TIBC: 309 ug/dL (ref 250–450)
UIBC: 234 ug/dL

## 2019-07-15 LAB — VITAMIN B12: Vitamin B-12: 279 pg/mL (ref 180–914)

## 2019-07-15 LAB — TSH: TSH: 2.81 u[IU]/mL (ref 0.350–4.500)

## 2019-07-15 LAB — FOLATE: Folate: 8.6 ng/mL (ref >5.9)

## 2019-07-15 LAB — LACTATE DEHYDROGENASE: LDH: 223 U/L — ABNORMAL HIGH (ref 98–192)

## 2019-07-15 IMAGING — MR MR HEAD W/O CM
9 of 10 series · 37 of 48 positions shown · non-contrast
Comparison: None.

CLINICAL DATA: Neuro deficits, subacute. Confusion, stuttering, and
jerking of the upper and lower extremities.

EXAM:
MRI HEAD WITHOUT CONTRAST
TECHNIQUE: Multiplanar, multiecho pulse sequences of the brain and surrounding
structures were obtained without intravenous contrast.

[Series 3: DWI · axial · 3.0mm · 1.09mm/px · z∈[-115,+18]mm · 9 of 94 slices shown (1 of 4)]
[im 1/94]
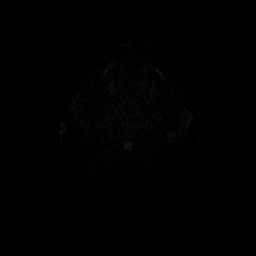
[im 12/94]
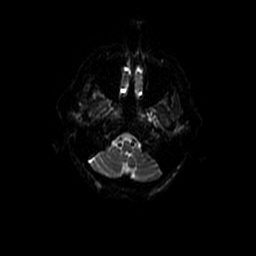
[im 24/94]
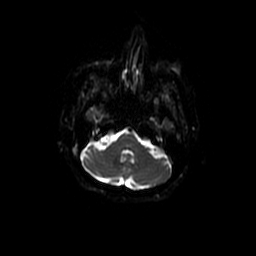
[im 35/94]
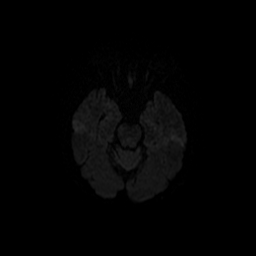
[im 47/94]
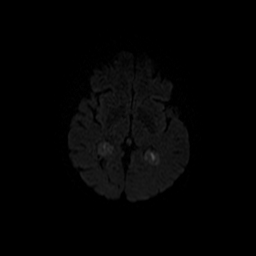
[im 59/94]
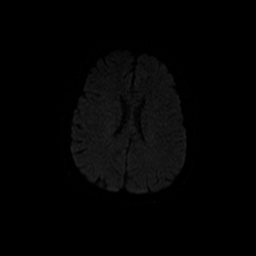
[im 70/94]
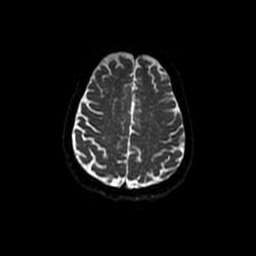
[im 82/94]
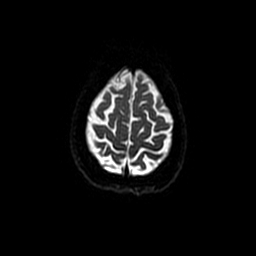
[im 94/94]
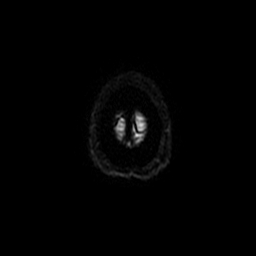

[Series 4: T1 · sagittal · 5.0mm · 0.47mm/px · 3 of 25 slices shown (1 of 2)]
[im 1/25]
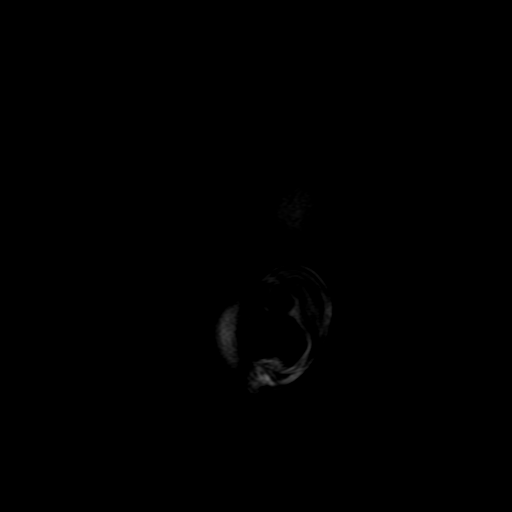
[im 13/25]
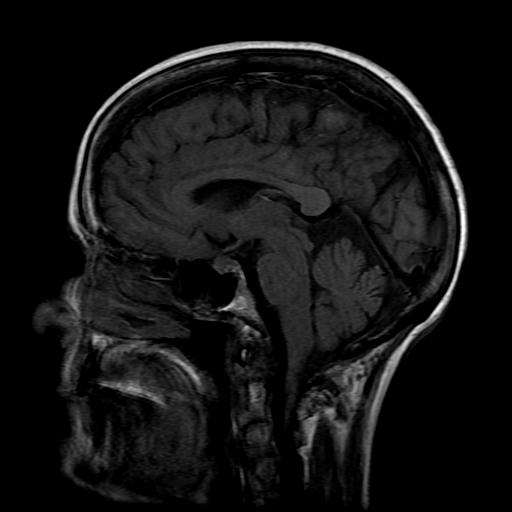
[im 25/25]
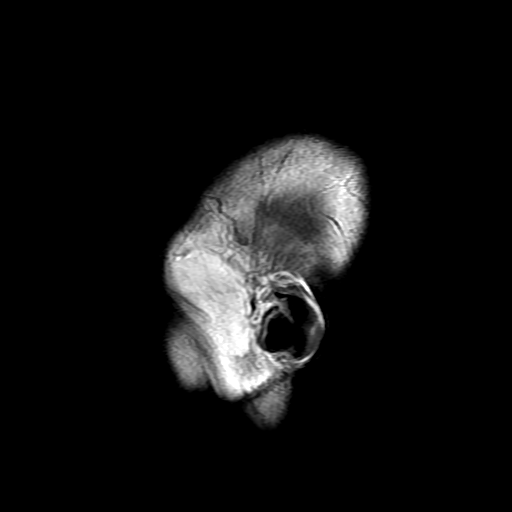

[Series 5: DWI · coronal · 5.0mm · 1.09mm/px · 7 of 72 slices shown (2 of 4)]
[im 1/72]
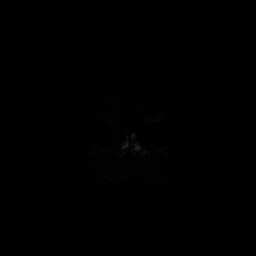
[im 12/72]
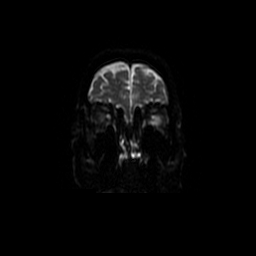
[im 24/72]
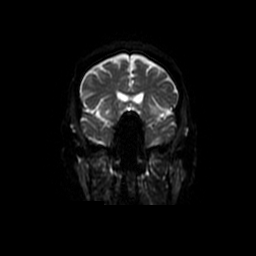
[im 36/72]
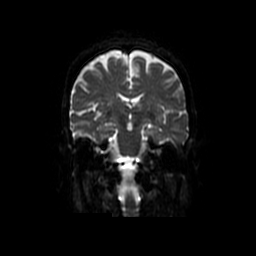
[im 48/72]
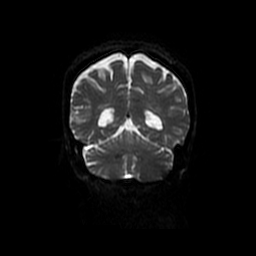
[im 60/72]
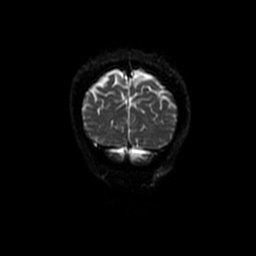
[im 72/72]
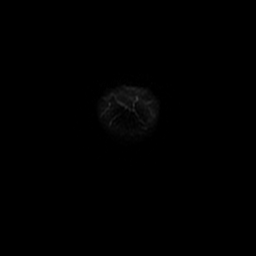

[Series 6: T2 · axial · 5.0mm · 0.43mm/px · z∈[-103,+31]mm · 2 of 24 slices shown (1 of 2)]
[im 1/24]
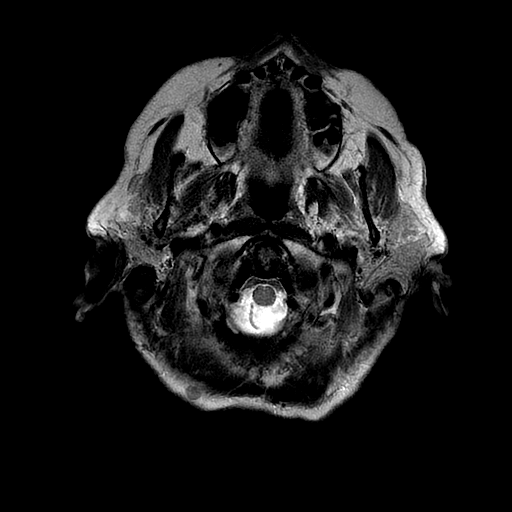
[im 24/24]
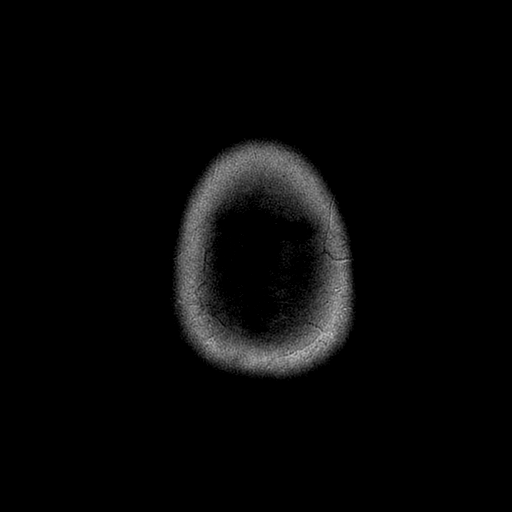

[Series 7: FLAIR · axial · 3.0mm · 0.43mm/px · z∈[-103,+43]mm · 3 of 26 slices shown]
[im 1/26]
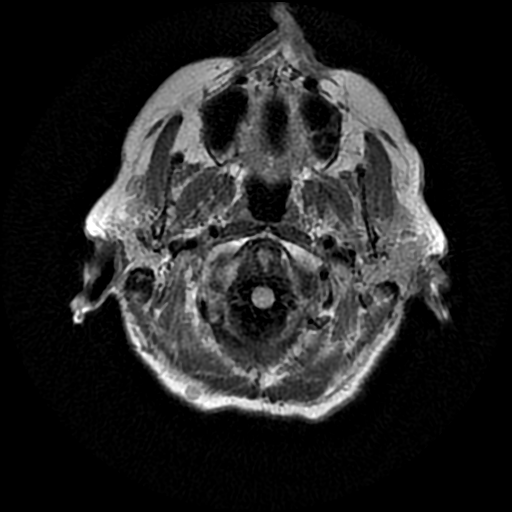
[im 13/26]
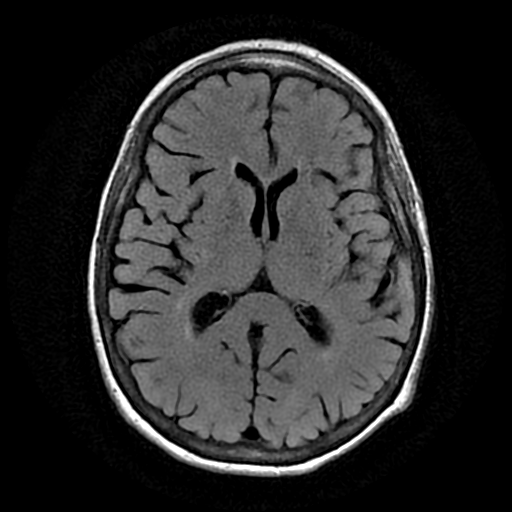
[im 26/26]
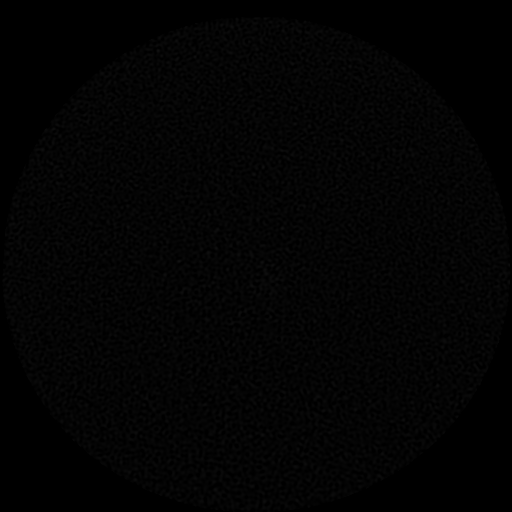

[Series 9: T1 · axial · 3.0mm · 0.47mm/px · 1 of 104 slices shown (2 of 2)]
[im 1/104]
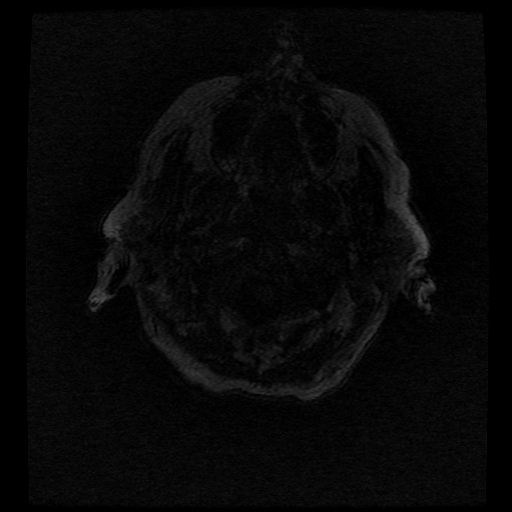

[Series 10: T2 · coronal · 5.0mm · 0.39mm/px · 3 of 28 slices shown (2 of 2)]
[im 1/28]
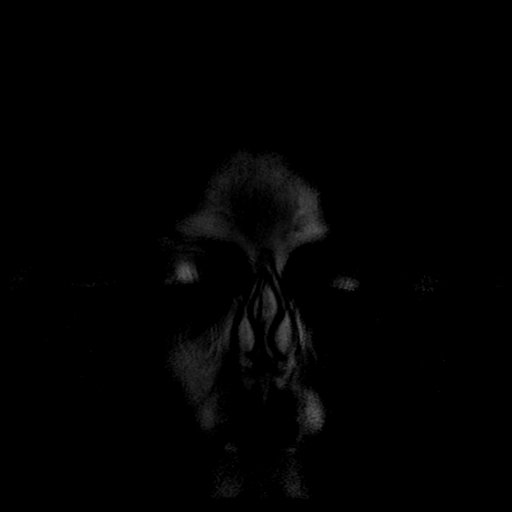
[im 14/28]
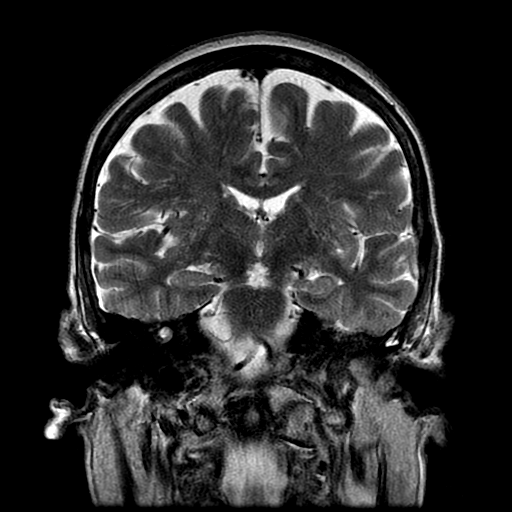
[im 28/28]
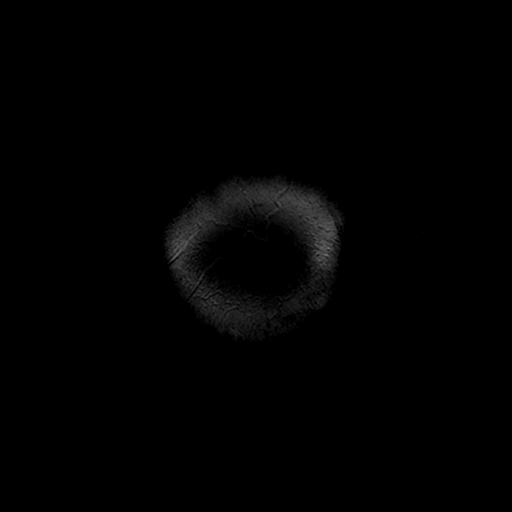

[Series 300: DWI · axial · 3.0mm · 1.09mm/px · z∈[-115,+18]mm · 5 of 47 slices shown (3 of 4)]
[im 1/47]
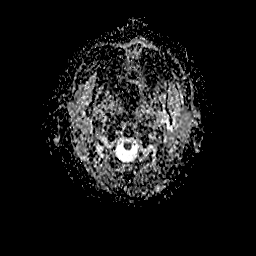
[im 12/47]
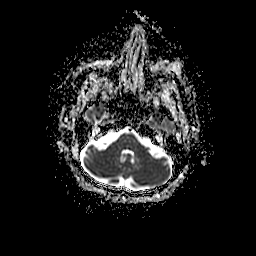
[im 24/47]
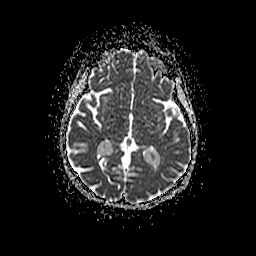
[im 35/47]
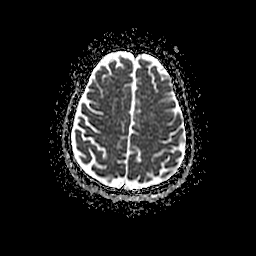
[im 47/47]
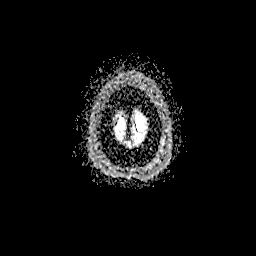

[Series 500: DWI · coronal · 5.0mm · 1.09mm/px · 4 of 36 slices shown (4 of 4)]
[im 1/36]
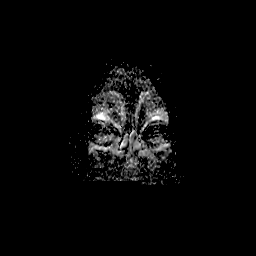
[im 12/36]
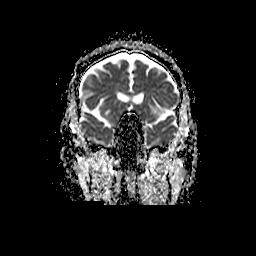
[im 24/36]
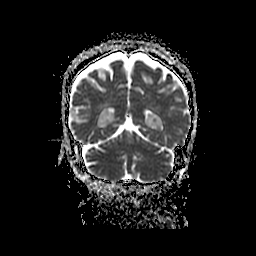
[im 36/36]
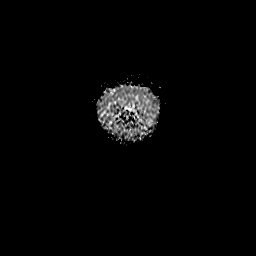

[37 of 48 positions shown; findings below may reference images not displayed]

FINDINGS: Brain: There is no evidence of acute infarct, intracranial
hemorrhage, mass, midline shift, or extra-axial fluid collection.
The ventricles and sulci are within normal limits for age. Small
foci of T2 hyperintensity in cerebral white matter bilaterally and
in the pons are nonspecific but compatible with mild chronic small
vessel ischemic disease.

Vascular: Major intracranial vascular flow voids are preserved.

Skull and upper cervical spine: Mild nonspecific diffuse bone marrow
heterogeneity without destructive lesion.

Sinuses/Orbits: Bilateral cataract extraction. Trace right mastoid
effusion. Clear paranasal sinuses.

Other: None.
IMPRESSION: 1. No acute intracranial abnormality.
2. Mild chronic small vessel ischemic disease.

## 2019-07-15 MED ORDER — SODIUM CHLORIDE 0.9 % IV SOLN: INTRAVENOUS | Status: AC

## 2019-07-15 MED ORDER — SODIUM CHLORIDE 0.9 % IV SOLN: INTRAVENOUS | Status: DC

## 2019-07-15 MED ORDER — ESCITALOPRAM OXALATE 10 MG PO TABS
10.0000 mg | ORAL_TABLET | Freq: Every day | ORAL | Status: DC
  Administered 2019-07-15 – 2019-07-17 (×3): 10 mg via ORAL
  Filled 2019-07-15 (×3): qty 1

## 2019-07-15 MED ORDER — AMLODIPINE BESYLATE 10 MG PO TABS
10.0000 mg | ORAL_TABLET | Freq: Every day | ORAL | Status: DC
  Administered 2019-07-15 – 2019-07-17 (×3): 10 mg via ORAL
  Filled 2019-07-15 (×3): qty 1

## 2019-07-15 MED ORDER — TRAZODONE HCL 50 MG PO TABS
50.0000 mg | ORAL_TABLET | Freq: Every evening | ORAL | Status: DC | PRN
  Administered 2019-07-15 – 2019-07-16 (×3): 50 mg via ORAL
  Filled 2019-07-15 (×3): qty 1

## 2019-07-15 MED ORDER — CHLORHEXIDINE GLUCONATE CLOTH 2 % EX PADS
6.0000 | MEDICATED_PAD | Freq: Every day | CUTANEOUS | Status: DC
  Administered 2019-07-15 – 2019-07-16 (×2): 6 via TOPICAL

## 2019-07-15 MED ORDER — ASPIRIN EC 81 MG PO TBEC
81.0000 mg | DELAYED_RELEASE_TABLET | Freq: Every day | ORAL | Status: DC
  Administered 2019-07-15 – 2019-07-17 (×3): 81 mg via ORAL
  Filled 2019-07-15 (×3): qty 1

## 2019-07-15 MED ORDER — GABAPENTIN 100 MG PO CAPS
200.0000 mg | ORAL_CAPSULE | Freq: Once | ORAL | Status: AC
  Administered 2019-07-15: 200 mg via ORAL
  Filled 2019-07-15: qty 2

## 2019-07-15 MED ORDER — GABAPENTIN 300 MG PO CAPS
300.0000 mg | ORAL_CAPSULE | Freq: Two times a day (BID) | ORAL | Status: DC
  Administered 2019-07-15 – 2019-07-16 (×2): 300 mg via ORAL
  Filled 2019-07-15 (×2): qty 1

## 2019-07-15 NOTE — TOC Initial Note (Addendum)
Transition of Care [facility]) - Initial/Assessment Note    Patient Details  Name: Stacy Sanchez MRN: [id number] Date of Birth: 03/29/1946  Transition of Care [facility]) CM/SW Contact:    Marilu Favre, RN Phone Number: 07/15/2019, 2:32 PM  Clinical Narrative:                 Patient from home alone. Epic address is correct.   Spoke to patient and daughter [name] at bedside.  Plan is patient will discharge to [facility] address which is [address], [place] ,[place] [address]. [name] is a Pharmacist, hospital and after tomorrow she will be on spring break for a week. Patient's other daughter Judson Roch lives in [place] and is coming tomorrow.   Patient has home oxygen already through [facility], patient and [name] understand family will have to move home oxygen equipment to [place] address.   Patient does need walker, ordered with Zack with [facility].  Provided Medicare.gov list of home health agencies. [name] would [facility]. Left message for [facility]. Awaiting call back. [facility] unable to provide home health due to insurance is not in network, (insurance changed since they had her as a patient).  Patient and daughter aware , they have no preference.  Butch Penny with [facility] unable to accept due to staffing.    Tiffany with [facility] can accept.   Expected Discharge Plan: [place]     Patient Goals and CMS Choice Patient states their goals for this hospitalization and ongoing recovery are:: to go home CMS Medicare.gov Compare Post Acute Care list provided to:: Patient Choice offered to / list presented to : Patient, Adult Children  Expected Discharge Plan and Services Expected Discharge Plan: [place]   Discharge Planning Services: CM Consult Post Acute Care Choice: Home Health, Durable Medical Equipment Living arrangements for the past 2 months: Single Family Home                 DME Arranged: Walker rolling DME Agency: [employer] Date DME Agency  Contacted: 07/15/19 Time DME Agency Contacted: 1914 Representative spoke with at DME Agency: [place]: PT, OT          Prior Living Arrangements/Services Living arrangements for the past 2 months: [place] with:: Self Patient language and need for interpreter reviewed:: Yes Do you feel safe going back to the place where you live?: Yes      Need for Family Participation in Patient Care: Yes (Comment) Care giver support system in place?: Yes (comment)   Criminal Activity/Legal Involvement Pertinent to Current Situation/Hospitalization: No - Comment as needed  Activities of Daily Living      Permission Sought/Granted   Permission granted to share information with : Yes, Verbal Permission Granted  Share Information with NAME: [name] and Bunnie Philips daughters  Permission granted to share info w AGENCY: [facility]        Emotional Assessment Appearance:: Appears stated age Attitude/Demeanor/Rapport: Engaged Affect (typically observed): Accepting Orientation: : Oriented to Self, Oriented to Place, Oriented to  Time, Oriented to Situation Alcohol / Substance Use: Not Applicable Psych Involvement: No (comment)  Admission diagnosis:  Uremia [N19] ARF (acute renal failure) (HCC) [N17.9] AKI (acute kidney injury) ([place]) [N17.9] Acute kidney injury ([place]) [N17.9] Altered mental status, unspecified altered mental status type [R41.82] Patient Active Problem List   Diagnosis Date Noted  . AKI (acute kidney injury) ([place]) 07/14/2019  . Thrombocytopenia ([place]) 07/14/2019  . Hypothyroidism 07/14/2019  . Acute metabolic  encephalopathy 07/14/2019  . 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 Chronic respiratory failure with hypoxia ([place]) 12/07/2018  . Bronchiectasis ([place]) 11/27/2018  . 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 Thrush 11/27/2018  . CAD (coronary artery disease) 11/27/2018  . CKD (chronic kidney disease), stage III 11/27/2018  . Essential hypertension 11/27/2018  .  Generalized anxiety disorder 11/27/2018  . Peripheral neuropathy 11/27/2018  . 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

## 2019-07-15 NOTE — Progress Notes (Signed)
Patient ID: Stacy Sanchez, female   DOB: [birth year], 74 y.o.   MRN: [id number]         [facility]  Date of Admission:  07/13/2019     ASSESSMENT: She seems to be about the same over the past 24 hours but is clearly improved from the video that her daughter showed me taken 48 hours ago.  Her mental fogginess has improved and she is not having the frequent myoclonic jerks that she had prior to admission.  Her renal function has improved slightly.  PLAN: 1. Continue observation off of antibiotics  Principal Problem:   AKI (acute kidney injury) ([place]) Active Problems:   Acute metabolic encephalopathy   Myoclonus   Bronchiectasis (HCC)   CAD (coronary artery disease)   Generalized anxiety disorder   Chronic respiratory failure with hypoxia (HCC)   Thrombocytopenia (HCC)   Hypothyroidism   Scheduled Meds: . amLODipine  10 mg Oral Daily  . heparin  5,000 Units Subcutaneous Q8H  . levothyroxine  50 mcg Oral Q0600  . nadolol  10 mg Oral Daily  . rosuvastatin  10 mg Oral Daily  . sodium chloride HYPERTONIC  4 mL Nebulization BID   Continuous Infusions: . sodium chloride 100 mL/hr at 07/15/19 0517   PRN Meds:.acetaminophen **OR** acetaminophen, hydrALAZINE, ipratropium-albuterol, ondansetron **OR** ondansetron (ZOFRAN) IV, traZODone   SUBJECTIVE: She says that she is still having some intermittent jerking of her hands.  She still has stuttering speech.  Review of Systems: Review of Systems  Constitutional: Negative for fever.  Respiratory: Negative for cough.   Cardiovascular: Negative for chest pain.  Gastrointestinal: Negative for abdominal pain, diarrhea, nausea and vomiting.  Genitourinary: Negative for dysuria and urgency.  Musculoskeletal: Positive for back pain.  Neurological: Positive for tremors.       Intermittent tremor, myoclonic jerks and stuttering speech    Allergies  Allergen Reactions  . Amikacin Other (See Comments)   nephropathy   . Ethambutol Other (See Comments)    neuropathy  . Levaquin [Levofloxacin] Other (See Comments)    Fainting and weakness  . Parafon Forte Dsc [Chlorzoxazone] Hives  . Rifabutin Diarrhea  . Tequin [Gatifloxacin] Other (See Comments)    Fainting and weakness  . Zyvox [Linezolid] Diarrhea    OBJECTIVE: Vitals:   07/15/19 0120 07/15/19 0345 07/15/19 0804 07/15/19 1337  BP: (!) 177/90 (!) 171/87  (!) 151/74  Pulse: 67 70  (!) 56  Resp: 16 16  18   Temp: 98.3 F (36.8 C) 97.8 F (36.6 C)  (!) 97.4 F (36.3 C)  TempSrc: Oral Oral  Oral  SpO2: 93% 93% 96% 100%  Weight:      Height:       Body mass index is 22.44 kg/m.  Physical Exam  Lab Results Lab Results  Component Value Date   WBC 5.0 07/15/2019   HGB 10.6 (L) 07/15/2019   HCT 34.2 (L) 07/15/2019   MCV 95.3 07/15/2019   PLT 89 (L) 07/15/2019    Lab Results  Component Value Date   CREATININE 2.95 (H) 07/15/2019   BUN 44 (H) 07/15/2019   NA 139 07/15/2019   K 4.9 07/15/2019   CL 106 07/15/2019   CO2 25 07/15/2019    Lab Results  Component Value Date   ALT 12 07/14/2019   AST 22 07/14/2019   ALKPHOS 50 07/14/2019   BILITOT 0.6 07/14/2019     Microbiology: Recent Results (from the past 240 hour(s))  SARS CORONAVIRUS 2 (TAT  6-24 HRS) Nasopharyngeal Nasopharyngeal Swab     Status: None   Collection Time: 07/14/19  5:29 AM   Specimen: Nasopharyngeal Swab  Result Value Ref Range Status   SARS Coronavirus 2 NEGATIVE NEGATIVE Final    Comment: (NOTE) SARS-CoV-2 target nucleic acids are NOT DETECTED. The SARS-CoV-2 RNA is generally detectable in upper and lower respiratory specimens during the acute phase of infection. Negative results do not preclude SARS-CoV-2 infection, do not rule out co-infections with other pathogens, and should not be used as the sole basis for treatment or other patient management decisions. Negative results must be combined with clinical observations, patient history,  and epidemiological information. The expected result is Negative. Fact Sheet for Patients: HairSlick.no Fact Sheet for Healthcare Providers: quierodirigir.com This test is not yet approved or cleared by the [place] FDA and  has been authorized for detection and/or diagnosis of SARS-CoV-2 by FDA under an Emergency Use Authorization (EUA). This EUA will remain  in effect (meaning this test can be used) for the duration of the COVID-19 declaration under Section 56 4(b)(1) of the Act, 21 U.S.C. section 360bbb-3(b)(1), unless the authorization is terminated or revoked sooner. Performed at [facility], [address] [place], [place] [address]     Cliffton Asters, MD [facility] [phone] pager   [phone] cell 07/15/2019, 3:30 PM

## 2019-07-15 NOTE — Progress Notes (Addendum)
Physical Therapy Treatment Patient Details Name: Stacy Sanchez MRN: [id number] DOB: Mar 26, 1946 Today's Date: 07/15/2019    History of Present Illness Stacy Sanchez is a 74 y.o. female with medical history significant for bronchiectasis, chronic hypoxic respiratory failure, CAD, CKD II-IIIa, and recent exacerbation of her bronchiectasis with Pseudomonas infection treated with cefepime at home, now presenting to the emergency department with uncontrolled jerking movements, confusion, stuttering speech.  Patient completed a 3-week course of cefepime via PICC at home on the night of 07/12/2019, reports that her respiratory status has returned to baseline, but she has developed uncontrolled jerking movements, mainly in the bilateral arms, but also affecting both legs.  She has developed some mild confusion and stuttering speech that prompted her ED visit.      PT Comments    Pt continues to progress towards PT goals. She demonstrates much improved gait today with fewer ataxic moments (3-4) for similar distance as prior day. Pt educated and given HEP handout for balance and LE strength progression. Pt reports no further questions and agrees to use RW for OOB activities currently. Due to random ataxic movement recommend patient should not drive and should have supervision for OOB activities to ensure safety with RW self management over first week returning home. Pt demonstrated reduced stuttering today (0 instances noted) than yesterday (>8), being able to talk while ambulating better. Recommended patient to follow up with OP PT if ataxic movements continue after discharge and if unable to get home health (would be best due to acute fall risk). Pt verbalized understanding. PT will continue following acutely to improve safety with transition to next level care.    Follow Up Recommendations  Home health PT;Other (comment);Supervision/Assistance - 24 hour;Outpatient PT(HHPT vs OP based on insurance approval)      Equipment Recommendations  Rolling walker with 5" wheels    Recommendations for Other Services       Precautions / Restrictions Precautions Precautions: Fall Restrictions Weight Bearing Restrictions: No    Mobility  Bed Mobility Overal bed mobility: Modified Independent     Transfers Overall transfer level: Needs assistance   Transfers: Sit to/from Stand Sit to Stand: Min guard Stand pivot transfers: Min assist       General transfer comment: CGA for safety due to ataxic gait/movements  Ambulation/Gait Ambulation/Gait assistance: Supervision;Min guard Gait Distance (Feet): 230 Feet Assistive device: Rolling walker (2 wheeled) Gait Pattern/deviations: Step-through pattern;Ataxic Gait velocity: near community Gait velocity interpretation: 1.31 - 2.62 ft/sec, indicative of limited community ambulator General Gait Details: Intermittent ataxic gait no LOB evident with RW      Balance Overall balance assessment: Needs assistance Sitting-balance support: Feet supported;No upper extremity supported Sitting balance-Leahy Scale: Fair     Standing balance support: Bilateral upper extremity supported Standing balance-Leahy Scale: Poor Standing balance comment: reliant on external support; unable to maintain balance when eyes are closed      Cognition Arousal/Alertness: Awake/alert Behavior During Therapy: WFL for tasks assessed/performed Overall Cognitive Status: Impaired/Different from baseline Area of Impairment: Attention;Orientation;Memory       Orientation Level: Disoriented to;Time Current Attention Level: Selective Memory: Decreased short-term memory         General Comments: Pt states she has been having more difficulty remembering things at time sand feels that she is in a "brain fog"      Exercises Other Exercises Other Exercises: Discussed exercise progression Other Exercises: Transition to community services to meet goals of functional  mobility for increased duration of performance.  Pertinent Vitals/Pain Pain Assessment: Faces Faces Pain Scale: Hurts little more Pain Location: Headache Pain Descriptors / Indicators: Aching Pain Intervention(s): Limited activity within patient's tolerance;Relaxation    Home Living Family/patient expects to be discharged to:: Private residence Living Arrangements: Alone Available Help at Discharge: Family;Neighbor;Available 24 hours/day(unsure) Type of Home: Other(Comment)(condo) Home Access: Level entry   Home Layout: Two level;Able to live on main level with bedroom/bathroom Home Equipment: Kasandra Knudsen - single point;Cane - quad;Shower seat;Grab bars - toilet;Grab bars - tub/shower;Toilet riser      Prior Function Level of Independence: Independent      Comments: drove; Independent with IADL taks; has dog   PT Goals (current goals can now be found in the care plan section) Acute Rehab PT Goals Patient Stated Goal: Return home to live independently PT Goal Formulation: With patient/family Time For Goal Achievement: 07/28/19 Potential to Achieve Goals: Good Progress towards PT goals: Progressing toward goals    Frequency    Min 5X/week      PT Plan Current plan remains appropriate       AM-PAC PT "6 Clicks" Mobility   Outcome Measure  Help needed turning from your back to your side while in a flat bed without using bedrails?: None Help needed moving from lying on your back to sitting on the side of a flat bed without using bedrails?: None Help needed moving to and from a bed to a chair (including a wheelchair)?: A Little Help needed standing up from a chair using your arms (e.g., wheelchair or bedside chair)?: A Little Help needed to walk in hospital room?: A Little Help needed climbing 3-5 steps with a railing? : A Lot 6 Click Score: 19    End of Session Equipment Utilized During Treatment: Gait belt Activity Tolerance: Patient tolerated treatment  well Patient left: in bed;with family/visitor present;with call bell/phone within reach Nurse Communication: Mobility status PT Visit Diagnosis: Unsteadiness on feet (R26.81);History of falling (Z91.81);Muscle weakness (generalized) (M62.81);Ataxic gait (R26.0)     Time: 2947-6546 PT Time Calculation (min) (ACUTE ONLY): 21 min  Charges:  $Gait Training: 8-22 mins                     Ann Held PT, DPT Acute Rehab [facility] P: [phone]    Renato Gails 07/15/2019, 3:41 PM

## 2019-07-15 NOTE — Progress Notes (Signed)
OT Treatment Note  Asked to see pt's daughter. Discussed concerns regarding pt being alone and daughter verbalized understanding. Pt plans to stay with daughter at her house at this time. Pt is hoping to be more mobile and able to move into her own house in a week with assistance of friends if needed. Daughter was unaware that pt suffered a fall PTA. Discussed options of hiring in-home care if needed once daughter returns to work. Daughter expressing concerns over her Mom's balance and possible forgetfulness/difficulty staying on task. Pt scheduled for WRI. Will plan to see again tomorrow to administer the Short Blessed Test in addition to family education. Will continue to follow.     07/15/19 1600  OT Visit Information  Last OT Received On 07/15/19  Assistance Needed +1  History of Present Illness Stacy Sanchez is a 74 y.o. female with medical history significant for bronchiectasis, chronic hypoxic respiratory failure, CAD, CKD II-IIIa, and recent exacerbation of her bronchiectasis with Pseudomonas infection treated with cefepime at home, now presenting to the emergency department with uncontrolled jerking movements, confusion, stuttering speech.  Patient completed a 3-week course of cefepime via PICC at home on the night of 07/12/2019, reports that her respiratory status has returned to baseline, but she has developed uncontrolled jerking movements, mainly in the bilateral arms, but also affecting both legs.  She has developed some mild confusion and stuttering speech that prompted her ED visit.    Precautions  Precautions Fall  Pain Assessment  Pain Assessment Faces  Faces Pain Scale 2  Pain Location Headache  Pain Descriptors / Indicators Aching  Pain Intervention(s) Monitored during session  Cognition  Arousal/Alertness Awake/alert  Behavior During Therapy WFL for tasks assessed/performed  Overall Cognitive Status Impaired/Different from baseline  Area of Impairment Attention;Memory   Current Attention Level Selective  Memory Decreased short-term memory  General Comments Repeated several topics that pt discussed with this OT eariler session; may be affected by poor sleep  ADL  Toilet Transfer Min guard;Ambulation;RW  Toileting- Architect and Hygiene Supervision/safety;Sit to/from stand  General ADL Comments Improved mobility with use of RW  Bed Mobility  Overal bed mobility Modified Independent  Balance  Overall balance assessment Needs assistance  Sitting balance-Leahy Scale Fair  Standing balance-Leahy Scale Poor  Standing balance comment reliant on external support; unable to maintain balance when eyes are closed  Transfers  Overall transfer level Needs assistance  Transfers Sit to/from Stand  Sit to Stand Min guard  General transfer comment unsteady with LOB without use of RW  General Comments  General comments (skin integrity, edema, etc.) Asked to see pt's daughter. Discussed concerns for DC adn need for S wtih all mobility @ RW level adn ADL. Pt will need to use oher shower seat and have direct S for ADL. Pt will need assistance for all meal prep. Discussed recommendation for fall alert system. Daughter reports concerns regarding pt's balance adn increase in forgetfulness/repeating herself at times.   Other Exercises  Other Exercises rthymic staiblization x 10 with pressure to upper dhest/posterior trunk. PT with difficulty modulating control adn weight shifts  Other Exercises sitting and reaching out of base of support  OT - End of Session  Equipment Utilized During Treatment Gait belt;Rolling walker  Activity Tolerance Patient tolerated treatment well  Patient left in bed;with call bell/phone within reach;with bed alarm set;with family/visitor present  Nurse Communication Mobility status  OT Assessment/Plan  OT Plan Discharge plan remains appropriate  OT Visit Diagnosis Unsteadiness on feet (  R26.81);Other abnormalities of gait and mobility  (R26.89);History of falling (Z91.81);Muscle weakness (generalized) (M62.81);Ataxia, unspecified (R27.0);Other symptoms and signs involving cognitive function;Pain  Pain - part of body  (head)  OT Frequency (ACUTE ONLY) Min 3X/week  Follow Up Recommendations Home health OT;Outpatient OT;Supervision/Assistance - 24 hour  OT Equipment None recommended by OT  AM-PAC OT "6 Clicks" Daily Activity Outcome Measure (Version 2)  Help from another person eating meals? 4  Help from another person taking care of personal grooming? 3  Help from another person toileting, which includes using toliet, bedpan, or urinal? 3  Help from another person bathing (including washing, rinsing, drying)? 3  Help from another person to put on and taking off regular upper body clothing? 3  Help from another person to put on and taking off regular lower body clothing? 3  6 Click Score 19  OT Goal Progression  Progress towards OT goals Progressing toward goals  Acute Rehab OT Goals  Patient Stated Goal Return home to live independently  OT Goal Formulation With patient  Time For Goal Achievement 07/29/19  Potential to Achieve Goals Good  ADL Goals  Pt Will Perform Lower Body Bathing with modified independence;sit to/from stand  Pt Will Perform Lower Body Dressing with modified independence;sit to/from stand  Pt Will Transfer to Toilet with modified independence;ambulating  Pt Will Perform Toileting - Clothing Manipulation and hygiene with modified independence;sitting/lateral leans  Pt/caregiver will Perform Home Exercise Program Both right and left upper extremity;Independently;With written HEP provided  Additional ADL Goal #1 Pt will independently verbalize 3 strategiegs to reduce risk fo falls  OT Time Calculation  OT Start Time (ACUTE ONLY) 1430  OT Stop Time (ACUTE ONLY) 1500  OT Time Calculation (min) 30 min  OT General Charges  $OT Visit 1 Visit  OT Treatments  $Self Care/Home Management  8-22 mins   $Therapeutic Activity 8-22 mins  Maurie Boettcher, OT/L   Acute OT Clinical Specialist [place] Pager [phone] Office [phone]

## 2019-07-15 NOTE — Progress Notes (Signed)
PROGRESS NOTE  Stacy Sanchez TGG:[id number] DOB: [birth year]   PCP: Glenna Durand, MD  Patient is from: Home.  DOA: 07/13/2019 LOS: 1  Brief Narrative / Interim history: 74 year old female with history of CAD, CKD-3A, HTN, depression, hypothyroidism, peripheral neuropathy, chronic respiratory failure recent exacerbation of bronchiectasis with Pseudomonas for which she completed 3 weeks of IV cefepime via PICC at home on 3/22 presenting with with confusion, stuttering and jerking upper and lower extremities.   In ED, HDS.  Saturating in 90s on 2 L. Cr 3.11 (1.168 days earlier).  Platelets 112.  Per H&P, EDP discussed case with neurology who did not feel imaging is warranted but recommended medical admission.    Patient was a started on IV fluid.  ID consulted the next day.  Concern about rare cefepime related neurotoxicity and nephrotoxicity.  ID recommended observation off antibiotics.  Neuro symptoms and renal function improving slowly.  Subjective: Seen and examined earlier this morning.  Confusion seems to have resolved.  Still with stuttering and myoclonic jerking.  She did not appreciate significant improvement.  Denies headache, vision change, GI or UTI symptoms.  Objective: Vitals:   07/15/19 0120 07/15/19 0345 07/15/19 0804 07/15/19 1337  BP: (!) 177/90 (!) 171/87  (!) 151/74  Pulse: 67 70  (!) 56  Resp: 16 16  18   Temp: 98.3 F (36.8 C) 97.8 F (36.6 C)  (!) 97.4 F (36.3 C)  TempSrc: Oral Oral  Oral  SpO2: 93% 93% 96% 100%  Weight:      Height:        Intake/Output Summary (Last 24 hours) at 07/15/2019 1645 Last data filed at 07/15/2019 07/17/2019 Gross per 24 hour  Intake 2108.77 ml  Output 925 ml  Net 1183.77 ml   Filed Weights   07/13/19 2351  Weight: 65 kg    Examination:  GENERAL: No acute distress.  Appears well.  HEENT: MMM.  Vision and hearing grossly intact.  NECK: Supple.  No apparent JVD.  RESP:  No IWOB. Good air movement bilaterally. CVS:  RRR.  Heart sounds normal.  ABD/GI/GU: Bowel sounds present. Soft. Non tender.  MSK/EXT:  Moves extremities. No apparent deformity. No edema.  SKIN: no apparent skin lesion or wound NEURO: Awake, alert and oriented appropriately.  Stuttering with myoclonic jerking in her arms.  Cranial nerves intact.  Motor 5/5 in BUE, 4/5 in BLE.  PSYCH: Calm. Normal affect.  Procedures:  None  Assessment & Plan: AKI on CKD-3a/azotemia: Cr 1.29 on 2/17 (0.8-0.9 prior to that)> 3.11 (admit)> 2.95.  BUN 44.  Renal 3/17 with nonobstructing 5 mm renal stone but no other acute finding.  UA with 100 protein and moderate Hgb.  Unclear cause of AKI at this time.  Concern about rare cefepime nephrotoxicity.  FeNa 0.8% suggesting prerenal etiology.  She has acute on chronic thrombocytopenia but stable Hgb.  -Continue IV NS at 100 cc an hour -Check autoimmune labs -Consult nephrology if no improvement  Acute metabolic encephalopathy with stuttering and myoclonus-concern about cefepime toxicity.  She is on gabapentin but appropriate dose for renal function.  Mental status improved.  Still with a stuttering and myoclonus.  No focal neuro deficit.  Ammonia within normal. -Check TSH and vitamin B12 -Check MRI brain without contrast. -Continue monitoring  Acute on chronic thrombocytopenia: 171 (on 2/17)> 112> 89.  H&H relatively stable.  No signs of bleeding. -Autoimmune labs as above -Check LDH and haptoglobin  Chronic respiratory failure/bronchiectasis-recently completed 3 weeks of IV cefepime for Pseudomonas  bronchiectasis.  She is currently at baseline from respiratory standpoint. -Continue monitoring  Essential hypertension: SBP in 170s. -Continue home nadolol -Add amlodipine 10 mg daily  Hypothyroidism -Check TSH -Continue home Synthroid  Mood disorder -Continue home Celexa  Neuropathy: Complaints neuropathic pain -Resume gabapentin-she is on 300 mg twice daily-which is appropriate for her renal  function.  History of CAD: No anginal symptoms. -Continue Crestor  Acute urinary retention?  Per RN, bladder scan 506 after voiding. -I&O cath, indwelling Foley if greater than 600 cc on I&O.                 DVT prophylaxis: Subcu heparin Code Status: Full code Family Communication: Patient and/or RN. Available if any question.  Discharge barrier: AKI-needs IV fluid.  Also further work-up for myoclonus and stuttering. Patient is from: Home Final disposition: Likely home once medically stable likely the next 48 hours  Consultants: Infectious disease   Microbiology summarized: COVID-19 negative.  Sch Meds:  Scheduled Meds: . amLODipine  10 mg Oral Daily  . aspirin EC  81 mg Oral Daily  . escitalopram  10 mg Oral Daily  . gabapentin  300 mg Oral BID  . heparin  5,000 Units Subcutaneous Q8H  . levothyroxine  50 mcg Oral Q0600  . nadolol  10 mg Oral Daily  . rosuvastatin  10 mg Oral Daily  . sodium chloride HYPERTONIC  4 mL Nebulization BID   Continuous Infusions:  PRN Meds:.acetaminophen **OR** acetaminophen, hydrALAZINE, ipratropium-albuterol, ondansetron **OR** ondansetron (ZOFRAN) IV, traZODone  Antimicrobials: Anti-infectives (From admission, onward)   None       I have personally reviewed the following labs and images: CBC: Recent Labs  Lab 07/14/19 0002 07/15/19 0137  WBC 6.0 5.0  NEUTROABS 3.3 2.3  HGB 11.8* 10.6*  HCT 38.3 34.2*  MCV 97.7 95.3  PLT 112* 89*   BMP &GFR Recent Labs  Lab 07/14/19 0002 07/15/19 0137  NA 141 139  K 4.7 4.9  CL 103 106  CO2 28 25  GLUCOSE 108* 94  BUN 44* 44*  CREATININE 3.11* 2.95*  CALCIUM 8.6* 8.1*   Estimated Creatinine Clearance: 16 mL/min (A) (by C-G formula based on SCr of 2.95 mg/dL (H)). Liver & Pancreas: Recent Labs  Lab 07/14/19 0002  AST 22  ALT 12  ALKPHOS 50  BILITOT 0.6  PROT 6.9  ALBUMIN 2.9*   No results for input(s): LIPASE, AMYLASE in the last 168 hours. Recent Labs  Lab  07/14/19 0510  AMMONIA 24   Diabetic: No results for input(s): HGBA1C in the last 72 hours. No results for input(s): GLUCAP in the last 168 hours. Cardiac Enzymes: No results for input(s): CKTOTAL, CKMB, CKMBINDEX, TROPONINI in the last 168 hours. No results for input(s): PROBNP in the last 8760 hours. Coagulation Profile: Recent Labs  Lab 07/14/19 1628  INR 1.1   Thyroid Function Tests: No results for input(s): TSH, T4TOTAL, FREET4, T3FREE, THYROIDAB in the last 72 hours. Lipid Profile: No results for input(s): CHOL, HDL, LDLCALC, TRIG, CHOLHDL, LDLDIRECT in the last 72 hours. Anemia Panel: Recent Labs    07/14/19 1628  RETICCTPCT 0.4   Urine analysis:    Component Value Date/Time   COLORURINE YELLOW 07/14/2019 0529   APPEARANCEUR HAZY (A) 07/14/2019 0529   LABSPEC 1.014 07/14/2019 0529   PHURINE 5.0 07/14/2019 0529   GLUCOSEU NEGATIVE 07/14/2019 0529   HGBUR MODERATE (A) 07/14/2019 0529   BILIRUBINUR NEGATIVE 07/14/2019 [place] 07/14/2019 0529   PROTEINUR  100 (A) 07/14/2019 0529   NITRITE NEGATIVE 07/14/2019 0529   LEUKOCYTESUR NEGATIVE 07/14/2019 0529   Sepsis Labs: Invalid input(s): PROCALCITONIN, LACTICIDVEN  Microbiology: Recent Results (from the past 240 hour(s))  SARS CORONAVIRUS 2 (TAT 6-24 HRS) Nasopharyngeal Nasopharyngeal Swab     Status: None   Collection Time: 07/14/19  5:29 AM   Specimen: Nasopharyngeal Swab  Result Value Ref Range Status   SARS Coronavirus 2 NEGATIVE NEGATIVE Final    Comment: (NOTE) SARS-CoV-2 target nucleic acids are NOT DETECTED. The SARS-CoV-2 RNA is generally detectable in upper and lower respiratory specimens during the acute phase of infection. Negative results do not preclude SARS-CoV-2 infection, do not rule out co-infections with other pathogens, and should not be used as the sole basis for treatment or other patient management decisions. Negative results must be combined with clinical  observations, patient history, and epidemiological information. The expected result is Negative. Fact Sheet for Patients: HairSlick.no Fact Sheet for Healthcare Providers: quierodirigir.com This test is not yet approved or cleared by the [place] FDA and  has been authorized for detection and/or diagnosis of SARS-CoV-2 by FDA under an Emergency Use Authorization (EUA). This EUA will remain  in effect (meaning this test can be used) for the duration of the COVID-19 declaration under Section 56 4(b)(1) of the Act, 21 U.S.C. section 360bbb-3(b)(1), unless the authorization is terminated or revoked sooner. Performed at [facility], [address] [place], [place] [address]     Radiology Studies: No results found.    Arsh Feutz T. Saretta Dahlem [facility]  If 7PM-7AM, please contact night-coverage [email or web address] Password [email or web address] 07/15/2019, 4:45 PM

## 2019-07-15 NOTE — Progress Notes (Signed)
Occupational Therapy Evaluation Patient Details Name: Stacy Sanchez MRN: [id number] DOB: October 06, 1945 Today's Date: 07/15/2019    History of Present Illness Stacy Sanchez is a 74 y.o. female with medical history significant for bronchiectasis, chronic hypoxic respiratory failure, CAD, CKD II-IIIa, and recent exacerbation of her bronchiectasis with Pseudomonas infection treated with cefepime at home, now presenting to the emergency department with uncontrolled jerking movements, confusion, stuttering speech.  Patient completed a 3-week course of cefepime via PICC at home on the night of 07/12/2019, reports that her respiratory status has returned to baseline, but she has developed uncontrolled jerking movements, mainly in the bilateral arms, but also affecting both legs.  She has developed some mild confusion and stuttering speech that prompted her ED visit.     Clinical Impression   PTA, pt lived alone and was independent with ADL and mobility. Pt uses 1.5 L O2 at home @ baseline. Pt states she had a fall at home PTA due to losing her balance. Pt currently requires min A with mobility and minguard A with ADL tasks. Pt continues to demonstrate an ataxic gait pattern and poor coordination with BUE, increasing her risk for falls and making IADL tasks, i.e. cooking, difficult and unsafe. Recommend pt have initial 24/7 S after DC and follow up [place]. If pt is unable to get [facility] services, she will benefit fomo follow up with OT at the neuro outpt.Center. Pt should refrain from driving at this time. Discussed with MD and CM. Will follow acutely to facilitate safe DC home.   Pt would benefit form information on fall alert call system for home.     Follow Up Recommendations  Home health OT;Supervision/Assistance - 24 hour(If HH not available, will need outpt OT/PT at neuro outpt)    Equipment Recommendations  None recommended by OT    Recommendations for Other Services       Precautions / Restrictions  Precautions Precautions: Fall      Mobility Bed Mobility Overal bed mobility: Modified Independent                Transfers Overall transfer level: Needs assistance   Transfers: Sit to/from Stand;Stand Pivot Transfers Sit to Stand: Min guard Stand pivot transfers: Min assist            Balance Overall balance assessment: Needs assistance Sitting-balance support: Feet supported;No upper extremity supported Sitting balance-Leahy Scale: Fair       Standing balance-Leahy Scale: Poor Standing balance comment: reliant on external support; unable to maintain balance when eyes are closed                           ADL either performed or assessed with clinical judgement   ADL Overall ADL's : Needs assistance/impaired Eating/Feeding: Modified independent   Grooming: Standing;Min guard   Upper Body Bathing: Set up;Sitting   Lower Body Bathing: Min guard;Sit to/from stand   Upper Body Dressing : Set up;Sitting   Lower Body Dressing: Min guard;Sit to/from stand   Toilet Transfer: Minimal assistance;Ambulation   Toileting- Clothing Manipulation and Hygiene: Supervision/safety;Sit to/from stand Toileting - Clothing Manipulation Details (indicate cue type and reason): having difficulty with urination     Functional mobility during ADLs: Minimal assistance       Vision Baseline Vision/History: Wears glasses Patient Visual Report: No change from baseline       Perception     Praxis      Pertinent Vitals/Pain Pain Assessment: Faces Faces Pain  Scale: Hurts little more Pain Location: Headache Pain Descriptors / Indicators: Aching Pain Intervention(s): Limited activity within patient's tolerance;Patient requesting pain meds-RN notified     Hand Dominance Right   Extremity/Trunk Assessment Upper Extremity Assessment Upper Extremity Assessment: RUE deficits/detail;LUE deficits/detail RUE Deficits / Details: apparent difficultywith fine  motor/gross motor coordination and functional use of UE; pt states she is dropping items frequently; generalized weakness; difficulty with finger to nose RUE Coordination: decreased fine motor;decreased gross motor LUE Deficits / Details: similar to R LUE Coordination: decreased fine motor;decreased gross motor   Lower Extremity Assessment Lower Extremity Assessment: Defer to PT evaluation(note buckling in knees)   Cervical / Trunk Assessment Cervical / Trunk Assessment: Other exceptions(dysmetric movements of trunk)   Communication Communication Communication: Expressive difficulties(stuttering)   Cognition Arousal/Alertness: Awake/alert Behavior During Therapy: WFL for tasks assessed/performed Overall Cognitive Status: Impaired/Different from baseline Area of Impairment: Attention;Orientation;Memory                 Orientation Level: Disoriented to;Time Current Attention Level: Selective Memory: Decreased short-term memory         General Comments: Pt states she has been having more difficulty remembering things at time sand feels that she is in a "brain fog"   General Comments       Exercises     Shoulder Instructions      Home Living Family/patient expects to be discharged to:: Private residence Living Arrangements: Alone Available Help at Discharge: Family;Neighbor;Available 24 hours/day(unsure) Type of Home: Other(Comment)(condo) Home Access: Level entry     Home Layout: Two level;Able to live on main level with bedroom/bathroom Alternate Level Stairs-Number of Steps: 1 step up into   Bathroom Shower/Tub: Producer, television/film/video: Standard Bathroom Accessibility: Yes How Accessible: Accessible via walker Home Equipment: Cane - single point;Cane - quad;Shower seat;Grab bars - toilet;Grab bars - tub/shower;Toilet riser          Prior Functioning/Environment Level of Independence: Independent        Comments: drove; Independent with IADL  taks; has dog        OT Problem List: Decreased strength;Impaired balance (sitting and/or standing);Decreased coordination;Decreased safety awareness;Decreased cognition;Decreased knowledge of use of DME or AE;Impaired tone;Impaired UE functional use;Pain      OT Treatment/Interventions: Self-care/ADL training;Therapeutic exercise;Neuromuscular education;DME and/or AE instruction;Therapeutic activities;Cognitive remediation/compensation;Patient/family education;Balance training    OT Goals(Current goals can be found in the care plan section) Acute Rehab OT Goals Patient Stated Goal: Return home to live independently OT Goal Formulation: With patient Time For Goal Achievement: 07/29/19 Potential to Achieve Goals: Good  OT Frequency: Min 3X/week   Barriers to D/C:            Co-evaluation              AM-PAC OT "6 Clicks" Daily Activity     Outcome Measure Help from another person eating meals?: None Help from another person taking care of personal grooming?: A Little Help from another person toileting, which includes using toliet, bedpan, or urinal?: A Little Help from another person bathing (including washing, rinsing, drying)?: A Little Help from another person to put on and taking off regular upper body clothing?: A Little Help from another person to put on and taking off regular lower body clothing?: A Little 6 Click Score: 19   End of Session Equipment Utilized During Treatment: Gait belt Nurse Communication: Mobility status;Patient requests pain meds;Other (comment)(DC plans)  Activity Tolerance: Patient tolerated treatment well Patient left: in bed;with call  bell/phone within reach;with bed alarm set  OT Visit Diagnosis: Unsteadiness on feet (R26.81);Other abnormalities of gait and mobility (R26.89);History of falling (Z91.81);Muscle weakness (generalized) (M62.81);Ataxia, unspecified (R27.0);Other symptoms and signs involving cognitive function;Pain Pain - part of  body: (headache)                Time: 0932-6712 OT Time Calculation (min): 40 min Charges:  OT General Charges $OT Visit: 1 Visit OT Evaluation $OT Eval Moderate Complexity: 1 Mod OT Treatments $Self Care/Home Management : 23-37 mins  Luisa Dago, OT/L   Acute OT Clinical Specialist Acute Rehabilitation Services Pager [phone] Office [phone]   [facility] 07/15/2019, 1:50 PM

## 2019-07-16 DIAGNOSIS — M549 Dorsalgia, unspecified: Secondary | ICD-10-CM

## 2019-07-16 DIAGNOSIS — R4182 Altered mental status, unspecified: Secondary | ICD-10-CM

## 2019-07-16 DIAGNOSIS — D509 Iron deficiency anemia, unspecified: Secondary | ICD-10-CM

## 2019-07-16 DIAGNOSIS — F8081 Childhood onset fluency disorder: Secondary | ICD-10-CM

## 2019-07-16 LAB — CBC
HCT: 35.4 % — ABNORMAL LOW (ref 36.0–46.0)
Hemoglobin: 10.9 g/dL — ABNORMAL LOW (ref 12.0–15.0)
MCH: 29.6 pg (ref 26.0–34.0)
MCHC: 30.8 g/dL (ref 30.0–36.0)
MCV: 96.2 fL (ref 80.0–100.0)
Platelets: 99 10*3/uL — ABNORMAL LOW (ref 150–400)
RBC: 3.68 MIL/uL — ABNORMAL LOW (ref 3.87–5.11)
RDW: 14.6 % (ref 11.5–15.5)
WBC: 4.8 10*3/uL (ref 4.0–10.5)
nRBC: 0 % (ref 0.0–0.2)

## 2019-07-16 LAB — C4 COMPLEMENT: Complement C4, Body Fluid: 15 mg/dL (ref 12–38)

## 2019-07-16 LAB — ANTINUCLEAR ANTIBODIES, IFA: ANA Ab, IFA: NEGATIVE

## 2019-07-16 LAB — ANTI-DNA ANTIBODY, DOUBLE-STRANDED: ds DNA Ab: <1 IU/mL (ref 0–9)

## 2019-07-16 LAB — RENAL FUNCTION PANEL
Albumin: 2.5 g/dL — ABNORMAL LOW (ref 3.5–5.0)
Anion gap: 7 (ref 5–15)
BUN: 42 mg/dL — ABNORMAL HIGH (ref 8–23)
CO2: 25 mmol/L (ref 22–32)
Calcium: 8.1 mg/dL — ABNORMAL LOW (ref 8.9–10.3)
Chloride: 112 mmol/L — ABNORMAL HIGH (ref 98–111)
Creatinine, Ser: 2.66 mg/dL — ABNORMAL HIGH (ref 0.44–1.00)
GFR calc Af Amer: 20 mL/min — ABNORMAL LOW (ref >60)
GFR calc non Af Amer: 17 mL/min — ABNORMAL LOW (ref >60)
Glucose, Bld: 83 mg/dL (ref 70–99)
Phosphorus: 4.6 mg/dL (ref 2.5–4.6)
Potassium: 4.7 mmol/L (ref 3.5–5.1)
Sodium: 144 mmol/L (ref 135–145)

## 2019-07-16 LAB — HEMOGLOBIN AND HEMATOCRIT, BLOOD
HCT: 34.4 % — ABNORMAL LOW (ref 36.0–46.0)
Hemoglobin: 10.6 g/dL — ABNORMAL LOW (ref 12.0–15.0)

## 2019-07-16 LAB — C3 COMPLEMENT: C3 Complement: 96 mg/dL (ref 82–167)

## 2019-07-16 MED ORDER — POLYETHYLENE GLYCOL 3350 17 G PO PACK
17.0000 g | PACK | Freq: Every day | ORAL | Status: DC | PRN
  Administered 2019-07-17: 17 g via ORAL
  Filled 2019-07-16: qty 1

## 2019-07-16 MED ORDER — GABAPENTIN 100 MG PO CAPS
200.0000 mg | ORAL_CAPSULE | Freq: Two times a day (BID) | ORAL | Status: DC
  Administered 2019-07-16 – 2019-07-17 (×2): 200 mg via ORAL
  Filled 2019-07-16 (×2): qty 2

## 2019-07-16 MED ORDER — SENNOSIDES-DOCUSATE SODIUM 8.6-50 MG PO TABS: 1.0000 | ORAL_TABLET | Freq: Two times a day (BID) | ORAL | Status: DC | PRN

## 2019-07-16 MED ORDER — BETHANECHOL CHLORIDE 25 MG PO TABS
25.0000 mg | ORAL_TABLET | Freq: Three times a day (TID) | ORAL | Status: DC
  Administered 2019-07-16 – 2019-07-17 (×4): 25 mg via ORAL
  Filled 2019-07-16 (×4): qty 1

## 2019-07-16 MED ORDER — FERROUS SULFATE 325 (65 FE) MG PO TABS
325.0000 mg | ORAL_TABLET | Freq: Two times a day (BID) | ORAL | Status: DC
  Administered 2019-07-16 – 2019-07-17 (×2): 325 mg via ORAL
  Filled 2019-07-16 (×2): qty 1

## 2019-07-16 MED ORDER — LORAZEPAM 2 MG/ML IJ SOLN
0.5000 mg | Freq: Two times a day (BID) | INTRAMUSCULAR | Status: DC
  Administered 2019-07-16: 0.5 mg via INTRAVENOUS
  Filled 2019-07-16: qty 1

## 2019-07-16 NOTE — TOC Progression Note (Signed)
Transition of Care [facility]) - Progression Note    Patient Details  Name: Lyn Joens MRN: [id number] Date of Birth: 1946/02/18  Transition of [facility] CM/SW Contact  Nadene Rubins Adria Devon, RN Phone Number: 07/16/2019, 11:22 AM  Clinical Narrative:     Spoke to patient and daughter Maralyn Sago at bedside. [facility]iscussed [facility] provide [facility]   Expected Discharge Plan: Home w Home Health Services    Expected Discharge Plan and Services Expected Discharge Plan: Home w Home Health Services   Discharge Planning Services: CM Consult Post Acute Care Choice: Home Health, Durable Medical Equipment Living arrangements for the past 2 months: Single Family Home                 DME Arranged: Walker rolling DME Agency: [employer] Date DME Agency Contacted: 07/15/19 Time DME Agency Contacted: 1431 Representative spoke with at DME Agency: [facility] HH Arranged: PT, OT           Social Determinants of Health (SDOH) Interventions    Readmission Risk Interventions No flowsheet data found.

## 2019-07-16 NOTE — Plan of Care (Signed)
  Problem: Education: Goal: Knowledge of General Education information will improve Description Including pain rating scale, medication(s)/side effects and non-pharmacologic comfort measures Outcome: Progressing   Problem: Clinical Measurements: Goal: Ability to maintain clinical measurements within normal limits will improve Outcome: Progressing   Problem: Activity: Goal: Risk for activity intolerance will decrease Outcome: Progressing   

## 2019-07-16 NOTE — Progress Notes (Signed)
PROGRESS NOTE  Stacy Sanchez EQA:[id number] DOB: Feb 07, 1946   PCP: Glenna Durand, MD  Patient is from: Home.  DOA: 07/13/2019 LOS: 2  Brief Narrative / Interim history: 74 year old female with history of CAD, CKD-3A, HTN, depression, hypothyroidism, peripheral neuropathy, chronic respiratory failure on 1.5 L at baseline, recent exacerbation of bronchiectasis with Pseudomonas for which she completed 3 weeks of IV cefepime via PICC at home on 3/22 presenting with with confusion, stuttering and jerking upper and lower extremities.   In ED, HDS.  Saturating in 90s on 2 L. Cr 3.11 (1.168 days earlier).  Platelets 112.  Per H&P, EDP discussed case with neurology who did not feel imaging is warranted but recommended medical admission.    Patient was a started on IV fluid.  ID consulted the next day.  Concern about rare cefepime related neurotoxicity and nephrotoxicity.  ID recommended observation off antibiotics.  MRI brain without significant finding.  Neuro symptoms and renal function improving slowly.  She developed acute urinary retention requiring Foley catheter.  Subjective: Seen and examined earlier this morning.  Still with significant stuttering and myoclonus/jerking.  No other complaints.  Denies headache, vision change, chest pain, dyspnea, GI symptoms.  She states she had difficulty completely emptying her bladder  Objective: Vitals:   07/15/19 2121 07/15/19 2128 07/16/19 0442 07/16/19 0812  BP:   135/74   Pulse:   63   Resp:   18   Temp:   98.6 F (37 C)   TempSrc:   Oral   SpO2: 92% 94% 98% 95%  Weight:      Height:        Intake/Output Summary (Last 24 hours) at 07/16/2019 1620 Last data filed at 07/16/2019 0443 Gross per 24 hour  Intake 593.09 ml  Output 2300 ml  Net -1706.91 ml   Filed Weights   07/13/19 2351  Weight: 65 kg    Examination:  GENERAL: No apparent distress.  Nontoxic. HEENT: MMM.  Vision and hearing grossly intact.  NECK: Supple.  No  apparent JVD.  RESP:  No IWOB. Good air movement bilaterally. CVS:  RRR. Heart sounds normal.  ABD/GI/GU: Bowel sounds present. Soft. Non tender.  Indwelling Foley catheter in place. MSK/EXT:  Moves extremities. No apparent deformity. No edema.  SKIN: no apparent skin lesion or wound NEURO: Awake, alert and oriented appropriately.  No apparent focal neuro deficit except for stuttering and myoclonic jerking of the extremities and whole body.  Cranial nerves and motor strength is grossly intact. PSYCH: Calm. Normal affect.  Procedures:  None  Assessment & Plan: AKI on CKD-3a/azotemia due to rash cefepime nephrotoxicity: Cr 1.29 on 2/17 (0.8-0.9 prior to that)> 3.11 (admit)> 2.95> 2.66.  BUN 44.  Renal US with nonobstructing 5 mm renal stone but no other acute finding.  UA with 100 protein and moderate Hgb. FeNa 0.8% suggesting prerenal etiology.  She has acute on chronic thrombocytopenia but stable Hgb.  -Increase IV NS to 125 cc an hour -Follow autoimmune labs. -Consult nephrology if no improvement  Acute metabolic encephalopathy with stuttering and myoclonus-concern about cefepime toxicity.  She is on gabapentin but appropriate dose for her renal function.  MRI brain without contrast without acute finding.  Mental status improved.  Still with a stuttering and myoclonus. Ammonia within normal.  Discussed with neurology, Dr. Laurence Slate who suggested low-dose Ativan twice daily and titrating as appropriate for symptom management. -Ativan 0.5 mg two times daily  Normocytic anemia: H&H relatively stable.  Iron saturation normal but low  ferritin at 2.  LDH 223 -P.o. ferrous sulfate with bowel regimen -Follow haptoglobin.  Acute on chronic thrombocytopenia: 171 (on 2/17)> 112> 89> 99.  Improving. -Autoimmune labs as above  Chronic respiratory failure/bronchiectasis-O1 0.5 L at baseline.  Recently completed 3 weeks of IV cefepime for Pseudomonas bronchiectasis.  She is currently at baseline from  respiratory standpoint. -Continue monitoring  Essential hypertension: Normotensive.. -Continue home nadolol -Added amlodipine 10 mg daily on 3/25.  Hypothyroidism: TSH within normal. -Continue home Synthroid  Mood disorder -Continue home Celexa  Neuropathy: Complaints neuropathic pain -Resume gabapentin-she is on 300 mg twice daily-which is appropriate for her renal function.  History of CAD: No anginal symptoms. -Continue Crestor  Acute urinary retention: PVR 506 cc.  Indwelling Foley inserted on 3/25. -Start bethanechol 25 mg 3 times daily -Voiding trial with intermittent bladder scan                 DVT prophylaxis: Subcu heparin Code Status: Full code Family Communication: Patient and/or RN. Available if any question.  Discharge barrier: AKI-needs IV fluid and safe disposition.  Patient to be discharged home with her daughter who will be home to provide 24/7 supervision.  Daughter will be home for spring break starting 3/27. Patient is from: Home Final disposition: Likely home with her daughter once medically stable likely the next 24 to 48 hours.  Consultants: Infectious disease, neurology (over the phone)   Microbiology summarized: COVID-19 negative.  Sch Meds:  Scheduled Meds: . amLODipine  10 mg Oral Daily  . aspirin EC  81 mg Oral Daily  . bethanechol  25 mg Oral TID  . Chlorhexidine Gluconate Cloth  6 each Topical Daily  . escitalopram  10 mg Oral Daily  . ferrous sulfate  325 mg Oral BID WC  . gabapentin  300 mg Oral BID  . heparin  5,000 Units Subcutaneous Q8H  . levothyroxine  50 mcg Oral Q0600  . LORazepam  0.5 mg Intravenous Q12H  . nadolol  10 mg Oral Daily  . rosuvastatin  10 mg Oral Daily  . sodium chloride HYPERTONIC  4 mL Nebulization BID   Continuous Infusions: . sodium chloride 125 mL/hr at 07/16/19 1530   PRN Meds:.acetaminophen **OR** acetaminophen, hydrALAZINE, ipratropium-albuterol, ondansetron **OR** ondansetron (ZOFRAN) IV,  polyethylene glycol, senna-docusate, traZODone  Antimicrobials: Anti-infectives (From admission, onward)   None       I have personally reviewed the following labs and images: CBC: Recent Labs  Lab 07/14/19 0002 07/15/19 0137 07/16/19 0153 07/16/19 0809  WBC 6.0 5.0  --  4.8  NEUTROABS 3.3 2.3  --   --   HGB 11.8* 10.6* 10.6* 10.9*  HCT 38.3 34.2* 34.4* 35.4*  MCV 97.7 95.3  --  96.2  PLT 112* 89*  --  99*   BMP &GFR Recent Labs  Lab 07/14/19 0002 07/15/19 0137 07/16/19 0153  NA 141 139 144  K 4.7 4.9 4.7  CL 103 106 112*  CO2 28 25 25   GLUCOSE 108* 94 83  BUN 44* 44* 42*  CREATININE 3.11* 2.95* 2.66*  CALCIUM 8.6* 8.1* 8.1*  PHOS  --   --  4.6   Estimated Creatinine Clearance: 17.8 mL/min (A) (by C-G formula based on SCr of 2.66 mg/dL (H)). Liver & Pancreas: Recent Labs  Lab 07/14/19 0002 07/16/19 0153  AST 22  --   ALT 12  --   ALKPHOS 50  --   BILITOT 0.6  --   PROT 6.9  --  ALBUMIN 2.9* 2.5*   No results for input(s): LIPASE, AMYLASE in the last 168 hours. Recent Labs  Lab 07/14/19 0510  AMMONIA 24   Diabetic: No results for input(s): HGBA1C in the last 72 hours. No results for input(s): GLUCAP in the last 168 hours. Cardiac Enzymes: No results for input(s): CKTOTAL, CKMB, CKMBINDEX, TROPONINI in the last 168 hours. No results for input(s): PROBNP in the last 8760 hours. Coagulation Profile: Recent Labs  Lab 07/14/19 1628  INR 1.1   Thyroid Function Tests: Recent Labs    07/15/19 1854  TSH 2.810   Lipid Profile: No results for input(s): CHOL, HDL, LDLCALC, TRIG, CHOLHDL, LDLDIRECT in the last 72 hours. Anemia Panel: Recent Labs    07/14/19 1628 07/15/19 1854  VITAMINB12  --  279  FOLATE  --  8.6  FERRITIN  --  2*  TIBC  --  309  IRON  --  75  RETICCTPCT 0.4 0.5   Urine analysis:    Component Value Date/Time   COLORURINE YELLOW 07/14/2019 0529   APPEARANCEUR HAZY (A) 07/14/2019 0529   LABSPEC 1.014 07/14/2019 0529    PHURINE 5.0 07/14/2019 0529   GLUCOSEU NEGATIVE 07/14/2019 0529   HGBUR MODERATE (A) 07/14/2019 0529   BILIRUBINUR NEGATIVE 07/14/2019 0529   KETONESUR NEGATIVE 07/14/2019 0529   PROTEINUR 100 (A) 07/14/2019 0529   NITRITE NEGATIVE 07/14/2019 0529   LEUKOCYTESUR NEGATIVE 07/14/2019 0529   Sepsis Labs: Invalid input(s): PROCALCITONIN, LACTICIDVEN  Microbiology: Recent Results (from the past 240 hour(s))  SARS CORONAVIRUS 2 (TAT 6-24 HRS) Nasopharyngeal Nasopharyngeal Swab     Status: None   Collection Time: 07/14/19  5:29 AM   Specimen: Nasopharyngeal Swab  Result Value Ref Range Status   SARS Coronavirus 2 NEGATIVE NEGATIVE Final    Comment: (NOTE) SARS-CoV-2 target nucleic acids are NOT DETECTED. The SARS-CoV-2 RNA is generally detectable in upper and lower respiratory specimens during the acute phase of infection. Negative results do not preclude SARS-CoV-2 infection, do not rule out co-infections with other pathogens, and should not be used as the sole basis for treatment or other patient management decisions. Negative results must be combined with clinical observations, patient history, and epidemiological information. The expected result is Negative. Fact Sheet for Patients: HairSlick.no Fact Sheet for Healthcare Providers: quierodirigir.com This test is not yet approved or cleared by the [place] FDA and  has been authorized for detection and/or diagnosis of SARS-CoV-2 by FDA under an Emergency Use Authorization (EUA). This EUA will remain  in effect (meaning this test can be used) for the duration of the COVID-19 declaration under Section 56 4(b)(1) of the Act, 21 U.S.C. section 360bbb-3(b)(1), unless the authorization is terminated or revoked sooner. Performed at [facility], [address] [place], [place] [address]     Radiology Studies: MR BRAIN WO CONTRAST  Result Date: 07/15/2019 CLINICAL  DATA:  Neuro deficits, subacute. Confusion, stuttering, and jerking of the upper and lower extremities. EXAM: MRI HEAD WITHOUT CONTRAST TECHNIQUE: Multiplanar, multiecho pulse sequences of the brain and surrounding structures were obtained without intravenous contrast. COMPARISON:  None. FINDINGS: Brain: There is no evidence of acute infarct, intracranial hemorrhage, mass, midline shift, or extra-axial fluid collection. The ventricles and sulci are within normal limits for age. Small foci of T2 hyperintensity in cerebral white matter bilaterally and in the pons are nonspecific but compatible with mild chronic small vessel ischemic disease. Vascular: Major intracranial vascular flow voids are preserved. Skull and upper cervical spine: Mild nonspecific  diffuse bone marrow heterogeneity without destructive lesion. Sinuses/Orbits: Bilateral cataract extraction. Trace right mastoid effusion. Clear paranasal sinuses. Other: None. IMPRESSION: 1. No acute intracranial abnormality. 2. Mild chronic small vessel ischemic disease. Electronically Signed   By: Logan Bores M.D.   On: 07/15/2019 17:23      Camyra Vaeth T. [place]  If 7PM-7AM, please contact night-coverage [email or web address] Password [email or web address] 07/16/2019, 4:20 PM

## 2019-07-16 NOTE — Progress Notes (Signed)
Patient ID: Stacy Sanchez, female   DOB: [birth year], 74 y.o.   MRN: [id number]         [facility] for Infectious Disease  Date of Admission:  07/13/2019     ASSESSMENT: She seems to be improving slowly since stopping cefepime 4 days ago.  Her kidney function has improved and her neurologic status seems to be improving.  Her daughter was concerned that the Ativan may be making some of her symptoms worse.  I would consider using it only as needed.  PLAN: 1. Continue observation off of antibiotics 2. Please call Dr. Tommy Medal [phone]) for any infectious disease questions this weekend  Principal Problem:   AKI (acute kidney injury) ([place]) Active Problems:   Acute metabolic encephalopathy   Myoclonus   Bronchiectasis ([place])   CAD (coronary artery disease)   Generalized anxiety disorder   Chronic respiratory failure with hypoxia (H[facility])   Thrombocytopenia ([facility])   Hypothyroidism   Scheduled Meds: . amLODipine  10 mg Oral Daily  . aspirin EC  81 mg Oral Daily  . bethanechol  25 mg Oral TID  . Chlorhexidine Gluconate Cloth  6 each Topical Daily  . escitalopram  10 mg Oral Daily  . ferrous sulfate  325 mg Oral BID WC  . gabapentin  300 mg Oral BID  . heparin  5,000 Units Subcutaneous Q8H  . levothyroxine  50 mcg Oral Q0600  . LORazepam  0.5 mg Intravenous Q12H  . nadolol  10 mg Oral Daily  . rosuvastatin  10 mg Oral Daily  . sodium chloride HYPERTONIC  4 mL Nebulization BID   Continuous Infusions: . sodium chloride 125 mL/hr at 07/16/19 1530   PRN Meds:.acetaminophen **OR** acetaminophen, hydrALAZINE, ipratropium-albuterol, ondansetron **OR** ondansetron (ZOFRAN) IV, polyethylene glycol, senna-docusate, traZODone   SUBJECTIVE: She was able to walk a lap in the hallway today with physical therapy.  Her daughter notes that this morning she seemed to be doing better but that this afternoon after receiving Ativan she is more lethargic and her stuttering is more  noticeable.  Review of Systems: Review of Systems  Constitutional: Negative for fever.  Respiratory: Negative for cough.   Cardiovascular: Negative for chest pain.  Gastrointestinal: Negative for abdominal pain, diarrhea, nausea and vomiting.  Genitourinary: Negative for dysuria and urgency.  Musculoskeletal: Positive for back pain.  Neurological: Positive for tremors.       Intermittent tremor, myoclonic jerks and stuttering speech    Allergies  Allergen Reactions  . Amikacin Other (See Comments)    nephropathy   . Ethambutol Other (See Comments)    neuropathy  . Levaquin [Levofloxacin] Other (See Comments)    Fainting and weakness  . Parafon Forte Dsc [Chlorzoxazone] Hives  . Rifabutin Diarrhea  . Tequin [Gatifloxacin] Other (See Comments)    Fainting and weakness  . Zyvox [Linezolid] Diarrhea    OBJECTIVE: Vitals:   07/15/19 2121 07/15/19 2128 07/16/19 0442 07/16/19 0812  BP:   135/74   Pulse:   63   Resp:   18   Temp:   98.6 F (37 C)   TempSrc:   Oral   SpO2: 92% 94% 98% 95%  Weight:      Height:       Body mass index is 22.44 kg/m.  Physical Exam Constitutional:      Comments: She is slightly groggy.  Neurological:     Comments: She still has some intermittent stuttering.  I did not witness any myoclonic jerks during my  exam.     Lab Results Lab Results  Component Value Date   WBC 4.8 07/16/2019   HGB 10.9 (L) 07/16/2019   HCT 35.4 (L) 07/16/2019   MCV 96.2 07/16/2019   PLT 99 (L) 07/16/2019    Lab Results  Component Value Date   CREATININE 2.66 (H) 07/16/2019   BUN 42 (H) 07/16/2019   NA 144 07/16/2019   K 4.7 07/16/2019   CL 112 (H) 07/16/2019   CO2 25 07/16/2019    Lab Results  Component Value Date   ALT 12 07/14/2019   AST 22 07/14/2019   ALKPHOS 50 07/14/2019   BILITOT 0.6 07/14/2019     Microbiology: Recent Results (from the past 240 hour(s))  SARS CORONAVIRUS 2 (TAT 6-24 HRS) Nasopharyngeal Nasopharyngeal Swab     Status:  None   Collection Time: 07/14/19  5:29 AM   Specimen: Nasopharyngeal Swab  Result Value Ref Range Status   SARS Coronavirus 2 NEGATIVE NEGATIVE Final    Comment: (NOTE) SARS-CoV-2 target nucleic acids are NOT DETECTED. The SARS-CoV-2 RNA is generally detectable in upper and lower respiratory specimens during the acute phase of infection. Negative results do not preclude SARS-CoV-2 infection, do not rule out co-infections with other pathogens, and should not be used as the sole basis for treatment or other patient management decisions. Negative results must be combined with clinical observations, patient history, and epidemiological information. The expected result is Negative. Fact Sheet for Patients: HairSlick.no Fact Sheet for Healthcare Providers: quierodirigir.com This test is not yet approved or cleared by the [place] FDA and  has been authorized for detection and/or diagnosis of SARS-CoV-2 by FDA under an Emergency Use Authorization (EUA). This EUA will remain  in effect (meaning this test can be used) for the duration of the COVID-19 declaration under Section 56 4(b)(1) of the Act, 21 U.S.C. section 360bbb-3(b)(1), unless the authorization is terminated or revoked sooner. Performed at [facility], [address], [place], [place] [address]     Cliffton Asters, MD [facility] [phone] pager   [phone] cell 07/16/2019, 4:27 PM

## 2019-07-16 NOTE — Progress Notes (Signed)
Physical Therapy Treatment Patient Details Name: Stacy Sanchez MRN: [id number] DOB: 08-Sep-1945 Today's Date: 07/16/2019    History of Present Illness Stacy Sanchez is a 74 y.o. female with medical history significant for bronchiectasis, chronic hypoxic respiratory failure, CAD, CKD II-IIIa, and recent exacerbation of her bronchiectasis with Pseudomonas infection treated with cefepime at home, now presenting to the emergency department with uncontrolled jerking movements, confusion, stuttering speech.  Patient completed a 3-week course of cefepime via PICC at home on the night of 07/12/2019, reports that her respiratory status has returned to baseline, but she has developed uncontrolled jerking movements, mainly in the bilateral arms, but also affecting both legs.  She has developed some mild confusion and stuttering speech that prompted her ED visit.      PT Comments    Pt was seen for mobility and follow up was discussed with pt and daughter.  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  Follow up will continue with progression of safety and gait training, but must emphasize that her recovery has no guaranteed time frame to occur.     Follow Up Recommendations  Home health PT;Other (comment);Supervision/Assistance - 24 hour;Outpatient PT     Equipment Recommendations  Rolling walker with 5" wheels    Recommendations for Other Services       Precautions / Restrictions Precautions Precautions: Fall Restrictions Weight Bearing Restrictions: No    Mobility  Bed Mobility Overal bed mobility: Needs Assistance Bed Mobility: Supine to Sit;Sit to Supine     Supine to sit: Supervision Sit to supine: Supervision      Transfers Overall transfer level: Needs assistance Equipment used: Rolling walker (2 wheeled);None Transfers: Sit to/from Stand Sit to Stand: Min  guard         General transfer comment: cued hand placement and set up to sit  Ambulation/Gait Ambulation/Gait assistance: Min guard Gait Distance (Feet): 250 Feet Assistive device: Rolling walker (2 wheeled) Gait Pattern/deviations: Step-through pattern;Ataxic;Drifts right/left   Gait velocity interpretation: <1.31 ft/sec, indicative of household ambulator General Gait Details: ataxic moments increased at end of walk, sudden forward push on walker   Stairs             Wheelchair Mobility    Modified Rankin (Stroke Patients Only)       Balance     Sitting balance-Leahy Scale: Fair       Standing balance-Leahy Scale: Poor                              Cognition Arousal/Alertness: Awake/alert Behavior During Therapy: WFL for tasks assessed/performed Overall Cognitive Status: Impaired/Different from baseline Area of Impairment: Awareness;Safety/judgement                   Current Attention Level: Selective Memory: Decreased recall of precautions   Safety/Judgement: Decreased awareness of deficits;Decreased awareness of safety Awareness: Intellectual   General Comments: requires redirection at every hallway obstacle      Exercises General Exercises - Lower Extremity Ankle Circles/Pumps: AROM;5 reps Quad Sets: AROM;10 reps Gluteal Sets: AROM;10 reps    General Comments General comments (skin integrity, edema, etc.): pt is only going to have help for a week but ataxia may persist      Pertinent Vitals/Pain Pain Assessment: Faces Pain Score: 0-No pain Faces Pain Scale: No  hurt    Home Living                      Prior Function            PT Goals (current goals can now be found in the care plan section) Acute Rehab PT Goals Patient Stated Goal: Return home to live independently    Frequency    Min 5X/week      PT Plan Current plan remains appropriate    Co-evaluation              AM-PAC PT "6  Clicks" Mobility   Outcome Measure  Help needed turning from your back to your side while in a flat bed without using bedrails?: None Help needed moving from lying on your back to sitting on the side of a flat bed without using bedrails?: None Help needed moving to and from a bed to a chair (including a wheelchair)?: A Little Help needed standing up from a chair using your arms (e.g., wheelchair or bedside chair)?: A Little Help needed to walk in hospital room?: A Little Help needed climbing 3-5 steps with a railing? : A Lot 6 Click Score: 19    End of Session Equipment Utilized During Treatment: Gait belt Activity Tolerance: Patient tolerated treatment well Patient left: in bed;with family/visitor present;with call bell/phone within reach Nurse Communication: Mobility status PT Visit Diagnosis: Unsteadiness on feet (R26.81);History of falling (Z91.81);Muscle weakness (generalized) (M62.81);Ataxic gait (R26.0)     Time: 2376-2831 PT Time Calculation (min) (ACUTE ONLY): 30 min  Charges:  $Gait Training: 8-22 mins $Therapeutic Exercise: 8-22 mins                    Ivar Drape 07/16/2019, 4:04 PM  Samul Dada, PT MS Acute Rehab Dept. Number: [facility] [id number] and [facility] [phone]

## 2019-07-16 NOTE — Progress Notes (Signed)
Occupational Therapy Treatment Patient Details Name: Stacy Sanchez MRN: [id number] DOB: 01/24/1946 Today's Date: 07/16/2019    History of present illness Stacy Sanchez is a 74 y.o. female with medical history significant for bronchiectasis, chronic hypoxic respiratory failure, CAD, CKD II-IIIa, and recent exacerbation of her bronchiectasis with Pseudomonas infection treated with cefepime at home, now presenting to the emergency department with uncontrolled jerking movements, confusion, stuttering speech.  Patient completed a 3-week course of cefepime via PICC at home on the night of 07/12/2019, reports that her respiratory status has returned to baseline, but she has developed uncontrolled jerking movements, mainly in the bilateral arms, but also affecting both legs.  She has developed some mild confusion and stuttering speech that prompted her ED visit.     OT comments  Pt had Ativan earlier which appears to have made her symptoms worse. Planned to assess cognition with the Short Blessed Test, however unable to do so at this time due to pt having Ativan. Educated pt/daughte ron completing HEP with theraband over the weekend. [employer] Brochure given for information on fall alert system. Will Continue to follow acutely to facilitate safe DC home with 24/7 S.   Follow Up Recommendations  Home health OT;Outpatient OT;Supervision/Assistance - 24 hour    Equipment Recommendations  None recommended by OT    Recommendations for Other Services      Precautions / Restrictions Precautions Precautions: Fall Restrictions Weight Bearing Restrictions: No       Mobility Bed Mobility Overal bed mobility: Needs Assistance Bed Mobility: Supine to Sit;Sit to Supine     Supine to sit: Supervision Sit to supine: Supervision      Transfers Overall transfer level: Needs assistance Equipment used: Rolling walker (2 wheeled);None Transfers: Sit to/from Stand Sit to Stand: Min assist  Appears more  unsteady this pm - loses balance easily if unsupported       General transfer comment: reliant on external support    Balance     Sitting balance-Leahy Scale: Fair       Standing balance-Leahy Scale: Poor                             ADL either performed or assessed with clinical judgement   ADL                                       Functional mobility during ADLs: Minimal assistance General ADL Comments: Pt now has indwelling cath     Vision  wears glasses     Perception     Praxis      Cognition Arousal/Alertness: Awake/alert Behavior During Therapy: WFL for tasks assessed/performed Overall Cognitive Status: Impaired/Different from baseline Area of Impairment: Awareness;Safety/judgement                   Current Attention Level: Selective Memory: Decreased short-term memory   Safety/Judgement: Decreased awareness of safety Awareness: Anticipatory   General Comments: Plan to attempt short Blessed Test, however pt had Ativan which most likely affecting cognition        Exercises EOther Exercises Other Exercises: Educated on general written HEP with level 2 theraband; seated marching   Shoulder Instructions       General Comments Educated pt's daughter on need for assistance with all mobility and ADL    Pertinent Vitals/ Pain  Pain Assessment: Faces Pain Score: 0-No pain Faces Pain Scale: No hurt  Home Living                                          Prior Functioning/Environment              Frequency  Min 3X/week        Progress Toward Goals  OT Goals(current goals can now be found in the care plan section)  Progress towards OT goals: Progressing toward goals  Acute Rehab OT Goals Patient Stated Goal: Return home to live independently OT Goal Formulation: With patient Time For Goal Achievement: 07/29/19 Potential to Achieve Goals: Good ADL Goals Pt Will Perform Lower  Body Bathing: with modified independence;sit to/from stand Pt Will Perform Lower Body Dressing: with modified independence;sit to/from stand Pt Will Transfer to Toilet: with modified independence;ambulating Pt Will Perform Toileting - Clothing Manipulation and hygiene: with modified independence;sitting/lateral leans Pt/caregiver will Perform Home Exercise Program: Both right and left upper extremity;Independently;With written HEP provided Additional ADL Goal #1: Pt will independently verbalize 3 strategiegs to reduce risk fo falls  Plan Discharge plan remains appropriate    Co-evaluation                 AM-PAC OT "6 Clicks" Daily Activity     Outcome Measure   Help from another person eating meals?: None Help from another person taking care of personal grooming?: A Little Help from another person toileting, which includes using toliet, bedpan, or urinal?: A Little Help from another person bathing (including washing, rinsing, drying)?: A Little Help from another person to put on and taking off regular upper body clothing?: A Little Help from another person to put on and taking off regular lower body clothing?: A Little 6 Click Score: 19    End of Session Equipment Utilized During Treatment: Oxygen  OT Visit Diagnosis: Unsteadiness on feet (R26.81);Other abnormalities of gait and mobility (R26.89);History of falling (Z91.81);Muscle weakness (generalized) (M62.81);Ataxia, unspecified (R27.0);Other symptoms and signs involving cognitive function;Pain   Activity Tolerance Patient tolerated treatment well   Patient Left in bed;with call bell/phone within reach;with bed alarm set;with family/visitor present   Nurse Communication Mobility status        Time: 4403-4742 OT Time Calculation (min): 21 min  Charges: OT General Charges $OT Visit: 1 Visit OT Treatments $Self Care/Home Management : 8-22 mins  Maurie Boettcher, OT/L   Acute OT Clinical Specialist [place] Pager [phone] Office [phone]    [facility] 07/16/2019, 5:14 PM

## 2019-07-17 ENCOUNTER — Other Ambulatory Visit: Payer: Self-pay | Admitting: Student

## 2019-07-17 DIAGNOSIS — D500 Iron deficiency anemia secondary to blood loss (chronic): Secondary | ICD-10-CM

## 2019-07-17 LAB — RENAL FUNCTION PANEL
Albumin: 2.5 g/dL — ABNORMAL LOW (ref 3.5–5.0)
Anion gap: 8 (ref 5–15)
BUN: 33 mg/dL — ABNORMAL HIGH (ref 8–23)
CO2: 24 mmol/L (ref 22–32)
Calcium: 8.2 mg/dL — ABNORMAL LOW (ref 8.9–10.3)
Chloride: 112 mmol/L — ABNORMAL HIGH (ref 98–111)
Creatinine, Ser: 2.4 mg/dL — ABNORMAL HIGH (ref 0.44–1.00)
GFR calc Af Amer: 22 mL/min — ABNORMAL LOW (ref >60)
GFR calc non Af Amer: 19 mL/min — ABNORMAL LOW (ref >60)
Glucose, Bld: 91 mg/dL (ref 70–99)
Phosphorus: 3.9 mg/dL (ref 2.5–4.6)
Potassium: 4.7 mmol/L (ref 3.5–5.1)
Sodium: 144 mmol/L (ref 135–145)

## 2019-07-17 LAB — CBC
HCT: 34.1 % — ABNORMAL LOW (ref 36.0–46.0)
Hemoglobin: 10.6 g/dL — ABNORMAL LOW (ref 12.0–15.0)
MCH: 29.9 pg (ref 26.0–34.0)
MCHC: 31.1 g/dL (ref 30.0–36.0)
MCV: 96.3 fL (ref 80.0–100.0)
Platelets: 95 10*3/uL — ABNORMAL LOW (ref 150–400)
RBC: 3.54 MIL/uL — ABNORMAL LOW (ref 3.87–5.11)
RDW: 14.6 % (ref 11.5–15.5)
WBC: 4.3 10*3/uL (ref 4.0–10.5)
nRBC: 0 % (ref 0.0–0.2)

## 2019-07-17 LAB — MAGNESIUM: Magnesium: 1.5 mg/dL — ABNORMAL LOW (ref 1.7–2.4)

## 2019-07-17 MED ORDER — BETHANECHOL CHLORIDE 10 MG PO TABS: 10.0000 mg | ORAL_TABLET | Freq: Three times a day (TID) | ORAL | 0 refills | Status: DC

## 2019-07-17 MED ORDER — SENNOSIDES-DOCUSATE SODIUM 8.6-50 MG PO TABS: 1.0000 | ORAL_TABLET | Freq: Two times a day (BID) | ORAL | Status: DC | PRN

## 2019-07-17 MED ORDER — AMLODIPINE BESYLATE 5 MG PO TABS: 5.0000 mg | ORAL_TABLET | Freq: Every day | ORAL | 1 refills | Status: DC

## 2019-07-17 MED ORDER — FERROUS SULFATE 325 (65 FE) MG PO TABS: 325.0000 mg | ORAL_TABLET | Freq: Two times a day (BID) | ORAL | 1 refills | Status: DC

## 2019-07-17 NOTE — Progress Notes (Signed)
Stacy Sanchez to be D/C'd  per MD order. Discussed with the patient and patient daughter and all questions fully answered.  VSS, Skin clean, dry and intact without evidence of skin break down, no evidence of skin tears noted.  IV catheter discontinued intact. Site without signs and symptoms of complications. Dressing and pressure applied.  An After Visit Summary was printed and given to the patient.  D/c education completed with patient/daughter including follow up instructions, medication list, d/c activities limitations if indicated, with other d/c instructions as indicated by MD - patient able to verbalize understanding, all questions fully answered.   Patient instructed to return to ED, call 911, or call MD for any changes in condition.   Patient to be escorted via WC, and D/C home via private auto.

## 2019-07-17 NOTE — Progress Notes (Signed)
Physical Therapy Treatment Patient Details Name: Ellizabeth Dacruz MRN: [id number] DOB: 1945/12/28 Today's Date: 07/17/2019    History of Present Illness Corrin Sieling is a 74 y.o. female with medical history significant for bronchiectasis, chronic hypoxic respiratory failure, CAD, CKD II-IIIa, and recent exacerbation of her bronchiectasis with Pseudomonas infection treated with cefepime at home, now presenting to the emergency department with uncontrolled jerking movements, confusion, stuttering speech.  Patient completed a 3-week course of cefepime via PICC at home on the night of 07/12/2019, reports that her respiratory status has returned to baseline, but she has developed uncontrolled jerking movements, mainly in the bilateral arms, but also affecting both legs.  She has developed some mild confusion and stuttering speech that prompted her ED visit.      PT Comments    Pt tolerated treatment well and demonstrates improved gait quality and activity tolerance at this time. Pt is able to negotiate stairs with limited assistance to ensure safety. PT provides education on energy conservation techniques for the home setting and for family vacation if approved by medical team. Acute PT will continue to follow until discharge is complete.   Follow Up Recommendations  Home health PT;Other (comment);Supervision/Assistance - 24 hour;Outpatient PT     Equipment Recommendations  Rolling walker with 5" wheels(pt has received RW and utilized during session)    Recommendations for Other Services       Precautions / Restrictions Precautions Precautions: Fall Restrictions Weight Bearing Restrictions: No    Mobility  Bed Mobility                  Transfers Overall transfer level: Needs assistance Equipment used: Rolling walker (2 wheeled) Transfers: Sit to/from Stand Sit to Stand: Supervision            Ambulation/Gait Ambulation/Gait assistance: Supervision Gait Distance (Feet): 600  Feet(2 additional trials of 100') Assistive device: Rolling walker (2 wheeled) Gait Pattern/deviations: Step-through pattern Gait velocity: limited community Gait velocity interpretation: 1.31 - 2.62 ft/sec, indicative of limited community ambulator General Gait Details: pt with slow but steady step through gait, one instance of decreased foot clearance with R foot although no noticeable LOB. Pt also reports feeling as if she leans further to R side at times when ambulating, however PT is unable to discenrably notice this   Stairs Stairs: Yes Stairs assistance: Min guard Stair Management: One rail Right;Alternating pattern;Step to pattern(alternating pattern up, step to down) Number of Stairs: 3     Wheelchair Mobility    Modified Rankin (Stroke Patients Only)       Balance Overall balance assessment: Needs assistance Sitting-balance support: No upper extremity supported;Feet supported Sitting balance-Leahy Scale: Good Sitting balance - Comments: supervision   Standing balance support: Single extremity supported;During functional activity Standing balance-Leahy Scale: Good Standing balance comment: supervision                            Cognition Arousal/Alertness: Awake/alert Behavior During Therapy: WFL for tasks assessed/performed Overall Cognitive Status: Impaired/Different from baseline Area of Impairment: Problem solving                             Problem Solving: Slow processing        Exercises      General Comments General comments (skin integrity, edema, etc.): VSS on RA      Pertinent Vitals/Pain Pain Assessment: No/denies pain    Home Living  Prior Function            PT Goals (current goals can now be found in the care plan section) Acute Rehab PT Goals Patient Stated Goal: Return home to live independently Progress towards PT goals: Progressing toward goals    Frequency    Min  5X/week      PT Plan Current plan remains appropriate    Co-evaluation              AM-PAC PT "6 Clicks" Mobility   Outcome Measure  Help needed turning from your back to your side while in a flat bed without using bedrails?: None Help needed moving from lying on your back to sitting on the side of a flat bed without using bedrails?: None Help needed moving to and from a bed to a chair (including a wheelchair)?: None Help needed standing up from a chair using your arms (e.g., wheelchair or bedside chair)?: None Help needed to walk in hospital room?: A Little Help needed climbing 3-5 steps with a railing? : A Little 6 Click Score: 22    End of Session   Activity Tolerance: Patient tolerated treatment well Patient left: (standing in room with daughter present) Nurse Communication: Mobility status PT Visit Diagnosis: Unsteadiness on feet (R26.81);History of falling (Z91.81);Muscle weakness (generalized) (M62.81);Ataxic gait (R26.0)     Time: 3299-2426 PT Time Calculation (min) (ACUTE ONLY): 25 min  Charges:  $Gait Training: 23-37 mins                     Arlyss Gandy, PT, DPT Acute Rehabilitation Pager: [phone]    Arlyss Gandy 07/17/2019, 12:45 PM

## 2019-07-17 NOTE — Progress Notes (Signed)
Patient's regular pharmacy closed. Sent Rxs to [facility] on [address] per daughter's request.

## 2019-07-17 NOTE — Discharge Summary (Signed)
Physician Discharge Summary  Stacy Sanchez CVK:[id number] DOB: October 07, 1945 DOA: 07/13/2019  PCP: Glenna Durand, MD  Admit date: 07/13/2019 Discharge date: 07/17/2019  Admitted From: Home. Disposition: Home.  Recommendations for Outpatient Follow-up:  1. Follow ups as below. 2. Please obtain CBC/BMP/Mag at follow up 3. Please follow up on the following pending results: SPEP, haptoglobin, ANCA  Home Health: PT/OT Equipment/Devices: Patient has appropriate DME  Discharge Condition: Stable CODE STATUS: Full code  Follow-up Information    Home, [facility] up.   Specialty: Home Health Services Why: home health  Contact information: [address] [place] [address] [phone]        Glenna Durand, MD. Schedule an appointment as soon as possible for a visit in 1 week(s).   Specialty: Family Medicine Contact information: [address] [place] [address] [phone]          Hospital Course: 74 year old female with history of CAD, CKD-3A, HTN, depression, hypothyroidism, peripheral neuropathy, chronic respiratory failure on 1.5 L at baseline, recent exacerbation of bronchiectasis with Pseudomonas for which she completed 3 weeks of IV cefepime via PICC at home on 3/22 presenting with with confusion, stuttering and jerking upper and lower extremities.   In ED, HDS.  Saturating in 90s on 2 L. Cr 3.11 (1.168 days earlier).  Platelets 112.  Per H&P, EDP discussed case with neurology who did not feel imaging is warranted but recommended medical admission.    Patient was a started on IV fluid.  ID consulted the next day.  Concern about rare cefepime related neurotoxicity and nephrotoxicity.  ID recommended observation off antibiotics.  MRI brain without significant finding.  Patient continued to have stuttering and jerking myoclonus.  Discussed with neurology, Dr. Laurence Slate on 3/26 who recommended low-dose benzo such as Ativan 0.5 mg twice daily.   However, patient did not tolerate Ativan due to somnolence.  So Ativan was discontinued.  Hospital course complicated by acute urinary retention with postvoid residual of 500 cc.  Indwelling Foley catheter placed on 3/25.  Started on bethanechol.  Foley catheter removed on 3/26.  She passed voiding trial.  Discharged on low-dose bethanechol 10 mg 3 times daily for 5 more days.  Overall, neuro symptoms and renal function improved slowly.  Home health PT/OT ordered.  She has appropriate DME.  Discharged home with daughter.  Recommended follow-up with PCP in 1 week.  See individual problem list below for more on hospital course.  Discharge Diagnoses:  AKI on CKD-3a/azotemia due to rash cefepime nephrotoxicity: Cr 1.29 on 2/17 (0.8-0.9 prior to that)> 3.11 (admit)> 2.95> 2.66>2.40.  BUN 44.  Renal US with nonobstructing 5 mm renal stone but no other acute finding.  UA with 100 protein and moderate Hgb. FeNa 0.8% suggesting prerenal etiology.  She has acute on chronic thrombocytopenia but stable Hgb.  Autoimmune work-up not revealing.  SPEP and ANCA pending. -Encouraged good hydration at home -Recheck renal function in 1 week.  Acute metabolic encephalopathy with stuttering and myoclonus-concern about cefepime toxicity.  MRI brain without contrast without acute finding.  Ammonia within normal.  Only on low-dose gabapentin at home.  Encephalopathy resolved.  Still with a stuttering and myoclonus but improved. Discussed with neurology, Dr. Laurence Slate who suggested low-dose Ativan twice daily and titrating as appropriate for symptom management.  However, patient did not tolerate Ativan.   Normocytic anemia:  Baseline Hgb 11-12 >> 11.8 (admit)>> 10.6>> 10.6.  H&H relatively stable.  Iron saturation normal but low ferritin  at 2.  LDH 223 -P.o. ferrous sulfate with bowel regimen -Haptoglobin pending. -Recheck CBC at follow-up.  Acute on chronic thrombocytopenia: 171 (on 2/17)> 112> 89> 99. HIT score low.   ITP?  Autoimmune labs negative so far. -Recheck CBC at follow-up  Chronic respiratory failure/bronchiectasis-on 1.5 L at baseline.  Recently completed 3 weeks of IV cefepime for Pseudomonas bronchiectasis.  She is currently at baseline from respiratory standpoint. -Continue home oxygen and hypertonic saline  Essential hypertension:  BP slightly elevated.  Likely due to IV fluid. -Continue home nadolol -Added amlodipine 5 mg daily  Hypothyroidism: TSH within normal. -Continue home Synthroid  Mood disorder -Continue home Celexa  Neuropathy: Complaints neuropathic pain -Resume gabapentin-she is on 300 mg twice daily-which is appropriate for her renal function.  History of CAD: No anginal symptoms. -Continue Crestor  Acute urinary retention: PVR 506 cc.  Indwelling Foley 3/25-3/26.  Passed voiding trial. -Discharged on bethanechol 10 mg 3 times daily for 5 more days.  Family question: Updated patient's daughter, [name] over the phone on the day of discharge.  Discharge Instructions  Discharge Instructions    Diet - low sodium heart healthy   Complete by: As directed    Discharge instructions   Complete by: As directed    It has been a pleasure taking care of you! You were hospitalized with some confusion, stuttering, jerking movements and acute kidney injury thought to be due to cefepime toxicity.  Your symptoms and kidney injury improved with intravenous fluid and supportive care.  We encourage you to keep yourself hydrated.  We will also encourage you to take precautions to prevent fall.  We have started you on iron pills and blood pressure medication during this hospitalization.  Please follow-up with your primary care doctor in 1 to 2 weeks to have your blood pressure, hemoglobin and kidney numbers checked.   Take care,   Increase activity slowly   Complete by: As directed      Allergies as of 07/17/2019      Reactions   Amikacin Other (See Comments)    nephropathy    Cefepime Other (See Comments)   Tremors Slurred speech  AKI   Ethambutol Other (See Comments)   neuropathy   Levaquin [levofloxacin] Other (See Comments)   Fainting and weakness   Parafon Forte Dsc [chlorzoxazone] Hives   Rifabutin Diarrhea   Tequin [gatifloxacin] Other (See Comments)   Fainting and weakness   Zyvox [linezolid] Diarrhea      Medication List    TAKE these medications   acetaminophen 650 MG CR tablet Commonly known as: TYLENOL Take 1,300 mg by mouth every 8 (eight) hours as needed for pain.   amLODipine 5 MG tablet Commonly known as: NORVASC Take 1 tablet (5 mg total) by mouth daily. Start taking on: July 18, 2019   aspirin EC 81 MG tablet Take 81 mg by mouth daily.   bethanechol 10 MG tablet Commonly known as: URECHOLINE Take 1 tablet (10 mg total) by mouth 3 (three) times daily.   Compressor/Nebulizer Misc 1 Product by Does not apply route once for 1 dose.   escitalopram 10 MG tablet Commonly known as: LEXAPRO Take 10 mg by mouth daily.   ferrous sulfate 325 (65 FE) MG tablet Take 1 tablet (325 mg total) by mouth 2 (two) times daily with a meal.   gabapentin 300 MG capsule Commonly known as: NEURONTIN Take 300 mg by mouth 2 (two) times daily.   ipratropium-albuterol 0.5-2.5 (3) MG/3ML Soln Commonly  known as: DUONEB Take 3 mLs by nebulization 3 (three) times daily as needed. What changed: when to take this   levothyroxine 50 MCG tablet Commonly known as: SYNTHROID Take 50-100 mcg by mouth See admin instructions. Take 1 tablet daily on Monday through Saturday and take 2 tablets on Saturday   multivitamin with minerals Tabs tablet Take 1 tablet by mouth daily.   nadolol 20 MG tablet Commonly known as: CORGARD Take 10 mg by mouth 2 (two) times daily.   polyethylene glycol powder 17 GM/SCOOP powder Commonly known as: GLYCOLAX/MIRALAX Take 17 g by mouth daily as needed for moderate constipation.   rosuvastatin 10 MG  tablet Commonly known as: CRESTOR Take 10 mg by mouth daily.   senna-docusate 8.6-50 MG tablet Commonly known as: Senokot-S Take 1 tablet by mouth 2 (two) times daily as needed for moderate constipation.   sodium chloride HYPERTONIC 3 % nebulizer solution Take 4 mLs by nebulization 2 (two) times daily as needed for other. What changed: when to take this            Durable Medical Equipment  (From admission, onward)         Start     Ordered   07/15/19 1429  For home use only DME Walker rolling  Once    Question Answer Comment  Walker: With 5 Inch Wheels   Patient needs a walker to treat with the following condition Weakness      07/15/19 1428          Consultations:  Infectious disease, neurology (over the phone)  Procedures/Studies:   CT CHEST WO CONTRAST  Result Date: 06/21/2019 CLINICAL DATA:  Bronchiectasis. Progressive productive cough concerning for worsening infection. EXAM: CT CHEST WITHOUT CONTRAST TECHNIQUE: Multidetector CT imaging of the chest was performed following the standard protocol without IV contrast. COMPARISON:  11/27/2018 FINDINGS: Cardiovascular: Mild cardiac enlargement. No pericardial effusion. Aortic atherosclerosis. Lad coronary artery calcification. Mediastinum/Nodes: Normal appearance of the thyroid gland. The trachea appears patent and is midline. Insert esophagus no enlarged axillary or supraclavicular lymph nodes. Subcarinal lymph node measures 1.5 cm, image 78/2. Unchanged from previous exam. Hilar structures are suboptimally evaluated due to lack of IV contrast material. Lungs/Pleura: No pleural effusion. Severe bilateral cylindrical bronchiectasis is again noted. Peribronchovascular nodularity and peripheral mucoid impaction again noted. Chronic scarring and volume loss within the lingula is similar to previous exam. Progressive subpleural consolidation within the posterior and lateral right upper lobe is identified, image 51/8. Upper  Abdomen: No acute abnormality. Musculoskeletal: Scoliosis and degenerative disc disease identified. IMPRESSION: 1. Severe bilateral cylindrical bronchiectasis with peribronchovascular nodularity and peripheral mucoid impaction. 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 2. Progressive subpleural consolidation within the posterior and lateral right upper lobe. Likely postinflammatory or infectious in etiology. Advise follow-up imaging with repeat CT of the chest in 3-6 months to ensure stability or resolution 3. Aortic atherosclerosis and multi vessel coronary artery calcification. Aortic Atherosclerosis (ICD10-I70.0). Electronically Signed   By: Signa Kell M.D.   On: 06/21/2019 16:30   MR BRAIN WO CONTRAST  Result Date: 07/15/2019 CLINICAL DATA:  Neuro deficits, subacute. Confusion, stuttering, and jerking of the upper and lower extremities. EXAM: MRI HEAD WITHOUT CONTRAST TECHNIQUE: Multiplanar, multiecho pulse sequences of the brain and surrounding structures were obtained without intravenous contrast. COMPARISON:  None. FINDINGS: Brain: There is no evidence of acute infarct, intracranial hemorrhage, mass, midline shift, or extra-axial fluid collection. The ventricles and sulci are within normal limits  for age. Small foci of T2 hyperintensity in cerebral white matter bilaterally and in the pons are nonspecific but compatible with mild chronic small vessel ischemic disease. Vascular: Major intracranial vascular flow voids are preserved. Skull and upper cervical spine: Mild nonspecific diffuse bone marrow heterogeneity without destructive lesion. Sinuses/Orbits: Bilateral cataract extraction. Trace right mastoid effusion. Clear paranasal sinuses. Other: None. IMPRESSION: 1. No acute intracranial abnormality. 2. Mild chronic small vessel ischemic disease. Electronically Signed   By: Sebastian Ache M.D.   On: 07/15/2019 17:23   US RENAL  Result Date: 07/14/2019 CLINICAL  DATA:  Acute kidney injury EXAM: RENAL / URINARY TRACT ULTRASOUND COMPLETE COMPARISON:  None. FINDINGS: Right Kidney: Renal measurements: 9.3 x 4.0 x 5.1 cm = volume: 98 mL. Increased renal parenchymal echogenicity. 1.5 cm upper pole cyst. Additional 0.7 cm midpole cyst. No solid renal lesion, shadowing stone, or hydronephrosis. Left Kidney: Renal measurements: 10.1 x 4.8 x 3.4 cm = volume: 85 mL. Increased renal parenchymal echogenicity. 5 mm shadowing calcification of the lower pole. 0.9 cm midpole cyst. Additional 1.4 cm lower pole left renal cyst. No solid renal lesion or hydronephrosis. Bladder: Moderately distended. Small diverticulum of the posterior bladder wall measuring approximately 1.5 cm. Somewhat irregular appearance of the bladder wall contour. No intraluminal abnormality seen. Other: None. IMPRESSION: 1. A single nonobstructing 5 mm left renal stone was seen. Otherwise, no evidence of obstructive uropathy. 2. Findings of chronic medical renal disease. 3. Posterior bladder wall diverticulum. 4. Somewhat irregular bladder wall contour, which is not well evaluated sonographically. Trabeculated bladder can be seen in the setting of neurogenic bladder or bladder outlet obstruction. Electronically Signed   By: Duanne Guess D.O.   On: 07/14/2019 12:10   IR PICC PLACEMENT RIGHT >5 YRS INC IMG GUIDE  Result Date: 06/21/2019 INDICATION: 74 year old with Pseudomonas infection. PICC line requested for IV antibiotics. EXAM: PICC LINE PLACEMENT WITH ULTRASOUND AND FLUOROSCOPIC GUIDANCE MEDICATIONS: None ANESTHESIA/SEDATION: None FLUOROSCOPY TIME:  Fluoroscopy Time: 48 seconds, 1 mGy COMPLICATIONS: None immediate. PROCEDURE: The patient was advised of the possible risks and complications and agreed to undergo the procedure. The patient was then brought to the angiographic suite for the procedure. The right arm was prepped with chlorhexidine, draped in the usual sterile fashion using maximum barrier technique  (cap and mask, sterile gown, sterile gloves, large sterile sheet, hand hygiene and cutaneous antiseptic). Local anesthesia was attained by infiltration with 1% lidocaine. Ultrasound demonstrated patency of the right brachial vein, and this was documented with an image. Under real-time ultrasound guidance, this vein was accessed with a 21 gauge micropuncture needle and image documentation was performed. The needle was exchanged over a guidewire for a peel-away sheath through which a 36 cm 5 [place] single lumen power injectable PICC was advanced, and positioned with its tip in the lower SVC. Fluoroscopy during the procedure and fluoro spot radiograph confirms appropriate catheter position. The catheter was flushed, capped, secured to the skin, and covered with a sterile dressing. IMPRESSION: Successful placement of a right arm PICC with sonographic and fluoroscopic guidance. The catheter is ready for use. Electronically Signed   By: Richarda Overlie M.D.   On: 06/21/2019 13:51       Discharge Exam: Vitals:   07/16/19 2202 07/17/19 0545  BP:  (!) 151/88  Pulse:  68  Resp:    Temp:  98 F (36.7 C)  SpO2: 92% 95%    GENERAL: No acute distress.  Appears well.  HEENT: MMM.  Vision and hearing  grossly intact.  NECK: Supple.  No apparent JVD.  RESP: 95% on 2 L by [place].  No IWOB.  Fair aeration bilaterally. CVS:  RRR. Heart sounds normal.  ABD/GI/GU: Bowel sounds present. Soft. Non tender.  MSK/EXT:  Moves extremities. No apparent deformity or edema.  SKIN: no apparent skin lesion or wound NEURO: Awake, alert and oriented appropriately.  Stuttering with jerking myoclonus.  No facial asymmetry.  Fair strength in all extremities. PSYCH: Calm. Normal affect.   The results of significant diagnostics from this hospitalization (including imaging, microbiology, ancillary and laboratory) are listed below for reference.     Microbiology: Recent Results (from the past 240 hour(s))  SARS CORONAVIRUS 2 (TAT 6-24  HRS) Nasopharyngeal Nasopharyngeal Swab     Status: None   Collection Time: 07/14/19  5:29 AM   Specimen: Nasopharyngeal Swab  Result Value Ref Range Status   SARS Coronavirus 2 NEGATIVE NEGATIVE Final    Comment: (NOTE) SARS-CoV-2 target nucleic acids are NOT DETECTED. The SARS-CoV-2 RNA is generally detectable in upper and lower respiratory specimens during the acute phase of infection. Negative results do not preclude SARS-CoV-2 infection, do not rule out co-infections with other pathogens, and should not be used as the sole basis for treatment or other patient management decisions. Negative results must be combined with clinical observations, patient history, and epidemiological information. The expected result is Negative. Fact Sheet for Patients: HairSlick.no Fact Sheet for Healthcare Providers: quierodirigir.com This test is not yet approved or cleared by the [place] FDA and  has been authorized for detection and/or diagnosis of SARS-CoV-2 by FDA under an Emergency Use Authorization (EUA). This EUA will remain  in effect (meaning this test can be used) for the duration of the COVID-19 declaration under Section 56 4(b)(1) of the Act, 21 U.S.C. section 360bbb-3(b)(1), unless the authorization is terminated or revoked sooner. Performed at [facility], [address], [place], [place] [address]      Labs: BNP (last 3 results) No results for input(s): BNP in the last 8760 hours. Basic Metabolic Panel: Recent Labs  Lab 07/14/19 0002 07/15/19 0137 07/16/19 0153 07/17/19 0436  NA 141 139 144 144  K 4.7 4.9 4.7 4.7  CL 103 106 112* 112*  CO2 28 25 25 24   GLUCOSE 108* 94 83 91  BUN 44* 44* 42* 33*  CREATININE 3.11* 2.95* 2.66* 2.40*  CALCIUM 8.6* 8.1* 8.1* 8.2*  MG  --   --   --  1.5*  PHOS  --   --  4.6 3.9   Liver Function Tests: Recent Labs  Lab 07/14/19 0002 07/16/19 0153 07/17/19 0436  AST 22   --   --   ALT 12  --   --   ALKPHOS 50  --   --   BILITOT 0.6  --   --   PROT 6.9  --   --   ALBUMIN 2.9* 2.5* 2.5*   No results for input(s): LIPASE, AMYLASE in the last 168 hours. Recent Labs  Lab 07/14/19 0510  AMMONIA 24   CBC: Recent Labs  Lab 07/14/19 0002 07/15/19 0137 07/16/19 0153 07/16/19 0809 07/17/19 0436  WBC 6.0 5.0  --  4.8 4.3  NEUTROABS 3.3 2.3  --   --   --   HGB 11.8* 10.6* 10.6* 10.9* 10.6*  HCT 38.3 34.2* 34.4* 35.4* 34.1*  MCV 97.7 95.3  --  96.2 96.3  PLT 112* 89*  --  99* 95*   Cardiac Enzymes:  No results for input(s): CKTOTAL, CKMB, CKMBINDEX, TROPONINI in the last 168 hours. BNP: Invalid input(s): POCBNP CBG: No results for input(s): GLUCAP in the last 168 hours. D-Dimer No results for input(s): DDIMER in the last 72 hours. Hgb A1c No results for input(s): HGBA1C in the last 72 hours. Lipid Profile No results for input(s): CHOL, HDL, LDLCALC, TRIG, CHOLHDL, LDLDIRECT in the last 72 hours. Thyroid function studies Recent Labs    07/15/19 1854  TSH 2.810   Anemia work up Recent Labs    07/14/19 1628 07/15/19 1854  VITAMINB12  --  279  FOLATE  --  8.6  FERRITIN  --  2*  TIBC  --  309  IRON  --  75  RETICCTPCT 0.4 0.5   Urinalysis    Component Value Date/Time   COLORURINE YELLOW 07/14/2019 0529   APPEARANCEUR HAZY (A) 07/14/2019 0529   LABSPEC 1.014 07/14/2019 0529   PHURINE 5.0 07/14/2019 0529   GLUCOSEU NEGATIVE 07/14/2019 0529   HGBUR MODERATE (A) 07/14/2019 0529   BILIRUBINUR NEGATIVE 07/14/2019 0529   KETONESUR NEGATIVE 07/14/2019 0529   PROTEINUR 100 (A) 07/14/2019 0529   NITRITE NEGATIVE 07/14/2019 0529   LEUKOCYTESUR NEGATIVE 07/14/2019 0529   Sepsis Labs Invalid input(s): PROCALCITONIN,  WBC,  LACTICIDVEN   Time coordinating discharge: 35 minutes  SIGNED:  Almon Hercules, MD  [facility] 07/17/2019, 2:30 PM  If 7PM-7AM, please contact night-coverage [email or web address] Password [email or web address]

## 2019-07-18 LAB — MPO/PR-3 (ANCA) ANTIBODIES
ANCA Proteinase 3: <3.5 U/mL (ref 0.0–3.5)
Myeloperoxidase Abs: <9 U/mL (ref 0.0–9.0)

## 2019-07-19 LAB — PROTEIN ELECTROPHORESIS, SERUM
A/G Ratio: 1.1 (ref 0.7–1.7)
Albumin ELP: 3.3 g/dL (ref 2.9–4.4)
Alpha-1-Globulin: 0.2 g/dL (ref 0.0–0.4)
Alpha-2-Globulin: 0.7 g/dL (ref 0.4–1.0)
Beta Globulin: 0.7 g/dL (ref 0.7–1.3)
Gamma Globulin: 1.2 g/dL (ref 0.4–1.8)
Globulin, Total: 2.9 g/dL (ref 2.2–3.9)
Total Protein ELP: 6.2 g/dL (ref 6.0–8.5)

## 2019-07-20 LAB — HAPTOGLOBIN: Haptoglobin: 133 mg/dL (ref 42–346)

## 2019-07-26 LAB — AEROBIC CULTURE
MICRO NUMBER:: 10163249
SPECIMEN QUALITY:: ADEQUATE

## 2019-07-26 LAB — MYCOBACTERIA,CULT W/FLUOROCHROME SMEAR
MICRO NUMBER:: 10163454
SMEAR:: NONE SEEN
SPECIMEN QUALITY:: ADEQUATE

## 2019-09-02 ENCOUNTER — Ambulatory Visit: Payer: Managed Care, Other (non HMO) | Admitting: Internal Medicine

## 2019-09-24 ENCOUNTER — Encounter: Payer: Self-pay | Admitting: Pulmonary Disease

## 2019-09-24 ENCOUNTER — Ambulatory Visit (INDEPENDENT_AMBULATORY_CARE_PROVIDER_SITE_OTHER): Payer: Managed Care, Other (non HMO) | Admitting: Pulmonary Disease

## 2019-09-24 ENCOUNTER — Other Ambulatory Visit: Payer: Self-pay

## 2019-09-24 VITALS — BP 118/82 | HR 54 | Temp 97.1°F | Ht 67.0 in | Wt 123.0 lb

## 2019-09-24 DIAGNOSIS — J9611 Chronic respiratory failure with hypoxia: Secondary | ICD-10-CM | POA: Diagnosis not present

## 2019-09-24 DIAGNOSIS — J479 Bronchiectasis, uncomplicated: Secondary | ICD-10-CM | POA: Diagnosis not present

## 2019-09-24 NOTE — Progress Notes (Signed)
[facility] [facility], [facility] Sleep [facility]  Chief Complaint  Patient presents with  . Follow-up    Chronic Respiratory failure, O2 as needed.    Constitutional:  BP 118/82 (BP Location: Left Arm, Cuff Size: Normal)   Pulse (!) 54   Temp (!) 97.1 F (36.2 C) (Oral)   Ht 5\' 7"  (1.702 m)   Wt 123 lb (55.8 kg)   SpO2 95%   BMI 19.26 kg/m   Past Medical History:  Hypothyroidism, HTN, OA, CKD 3a, HLD, Depression, Anxiety, CAD  Brief Summary:  Stacy Sanchez is a 74 y.o. female dx with MAI in 2992 complicated by bronchiectasis with recurrent Pseudomonal infections.  Subjective:  She is here with her daughter.  She had an exacerbation in March.  Had Pseudomonas in her sputum that was pan sensitive.  She was started on cefepime through PICC line as outpt.  In her 3rd week of therapy she developed confusion and tremor felt to be related to cefepime.  She required hospitalization.  These have since resolved.  She has cough with clear to green sputum.  About once per week she will cough up a plug, and this takes a lot of effort to expectorate.  She is not having wheeze, fever, sweats.  Maintaining her weight.  She was able to visit her son in [place] after she completed her COVID vaccinations.  She is using oxygen at night.  She has pulse oximeter and during the day her SpO2 has been consistently above 90% on room air.  She is using hypertonic saline and duoneb bid.  She hasn't been using mucinex recently.  She uses flutter valve.   Physical Exam:   Appearance - well kempt, thin  ENMT - no sinus tenderness, no oral exudate, no LAN, Mallampati 2 airway, no stridor  Respiratory - equal breath sounds bilaterally, no wheezing or rales  CV - s1s2 regular rate and rhythm, no murmurs  Ext - no clubbing, no edema  Skin - no rashes  Psych - normal mood and affect   Assessment/Plan:   Bronchiectasis with hx of MAI and Pseudomonas. - continue hypertonic saline and duoneb  bid - advised her to use mucinex on more regular basis - continue flutter valve - don't think she needs chest vest at this time - if she need ID follow up she would prefer to have this with Dr. Megan Salon - will determine at follow up in 3 months whether she needs repeat CT chest to monitor RUL area of consolidation on CT chest from 06/21/19  Chronic respiratory failure with hypoxia. - goal SpO2 > 90% - she monitors her pulse oximeter at home - continue 1.5 liters oxygen at night and prn with exertion  A total of  32 minutes spent addressing patient care issues on day of visit.   Follow up:   Patient Instructions  Follow up in 3 months   Signature:  Chesley Mires, MD [facility] Pager: [phone] 09/24/2019, 1:04 PM  Flow Sheet      Chest imaging:   CT angio chest 11/27/18 >> prominent adenopathy, mosaic attenuation, BTX, central airway thickening, mucus plugging, mild emphysema, density in RUL  CT chest 06/22/19 >> severe b/l cylindrical BTX, peribronchovascular nodularity and peripheral mucoid impaction, scarring and volume lose in lingula, subpleural consolidation in RUL  Micro data:   Sputum 11/30/18 >> Pseudomonas aeruginosa, pan-S  Medications:   Allergies as of 09/24/2019      Reactions   Amikacin Other (See Comments)  nephropathy    Cefepime Other (See Comments)   Tremors Slurred speech  AKI   Ethambutol Other (See Comments)   neuropathy   Levaquin [levofloxacin] Other (See Comments)   Fainting and weakness   Parafon Forte Dsc [chlorzoxazone] Hives   Rifabutin Diarrhea   Tequin [gatifloxacin] Other (See Comments)   Fainting and weakness   Zyvox [linezolid] Diarrhea      Medication List       Accurate as of September 24, 2019  1:04 PM. If you have any questions, ask your nurse or doctor.        acetaminophen 650 MG CR tablet Commonly known as: TYLENOL Take 1,300 mg by mouth every 8 (eight) hours as needed for pain.   amLODipine 5  MG tablet Commonly known as: NORVASC Take 1 tablet (5 mg total) by mouth daily.   aspirin EC 81 MG tablet Take 81 mg by mouth daily.   bethanechol 10 MG tablet Commonly known as: URECHOLINE Take 1 tablet (10 mg total) by mouth 3 (three) times daily.   Compressor/Nebulizer Misc 1 Product by Does not apply route once for 1 dose.   escitalopram 10 MG tablet Commonly known as: LEXAPRO Take 10 mg by mouth daily.   ferrous sulfate 325 (65 FE) MG tablet Take 1 tablet (325 mg total) by mouth 2 (two) times daily with a meal.   gabapentin 300 MG capsule Commonly known as: NEURONTIN Take 300 mg by mouth 2 (two) times daily.   ipratropium-albuterol 0.5-2.5 (3) MG/3ML Soln Commonly known as: DUONEB Take 3 mLs by nebulization 3 (three) times daily as needed. What changed: when to take this   levothyroxine 50 MCG tablet Commonly known as: SYNTHROID Take 50-100 mcg by mouth See admin instructions. Take 1 tablet daily on Monday through Saturday and take 2 tablets on Saturday   multivitamin with minerals Tabs tablet Take 1 tablet by mouth daily.   nadolol 20 MG tablet Commonly known as: CORGARD Take 10 mg by mouth 2 (two) times daily.   polyethylene glycol powder 17 GM/SCOOP powder Commonly known as: GLYCOLAX/MIRALAX Take 17 g by mouth daily as needed for moderate constipation.   rosuvastatin 10 MG tablet Commonly known as: CRESTOR Take 10 mg by mouth daily.   senna-docusate 8.6-50 MG tablet Commonly known as: Senokot-S Take 1 tablet by mouth 2 (two) times daily as needed for moderate constipation.   sodium chloride HYPERTONIC 3 % nebulizer solution Take 4 mLs by nebulization 2 (two) times daily as needed for other. What changed: when to take this       Past Surgical History:  She  has a past surgical history that includes Cardiac surgery; Abdominal hysterectomy; Tonsillectomy (1956); Bronchoscopy (2001); Colonoscopy (2008); Cyst removal trunk (1965); Dilation and curettage  of uterus (1975); Hand surgery (Right, 1983); Patent ductus arterious repair (1952); and Varicose vein surgery (1972).  Family History:  Her family history includes Thyroid disease in her daughter.  Social History:  She  reports that she has never smoked. She has never used smokeless tobacco. She reports that she does not drink alcohol or use drugs.

## 2019-09-24 NOTE — Patient Instructions (Signed)
Follow up in 3 months

## 2019-11-09 ENCOUNTER — Telehealth: Payer: Self-pay | Admitting: Pulmonary Disease

## 2019-11-09 DIAGNOSIS — Z9989 Dependence on other enabling machines and devices: Secondary | ICD-10-CM

## 2019-11-09 DIAGNOSIS — G4733 Obstructive sleep apnea (adult) (pediatric): Secondary | ICD-10-CM

## 2019-11-09 MED ORDER — IPRATROPIUM-ALBUTEROL 0.5-2.5 (3) MG/3ML IN SOLN: 3.0000 mL | Freq: Three times a day (TID) | RESPIRATORY_TRACT | 1 refills | Status: AC | PRN

## 2019-11-09 MED ORDER — SODIUM CHLORIDE 3 % IN NEBU: 4.0000 mL | INHALATION_SOLUTION | Freq: Two times a day (BID) | RESPIRATORY_TRACT | 2 refills | Status: AC | PRN

## 2019-11-09 NOTE — Telephone Encounter (Signed)
Rx for both neb sol has been sent to pharmacy for pt. Nothing further needed.

## 2019-12-17 ENCOUNTER — Ambulatory Visit (INDEPENDENT_AMBULATORY_CARE_PROVIDER_SITE_OTHER): Payer: Medicare HMO | Admitting: Pulmonary Disease

## 2019-12-17 ENCOUNTER — Encounter: Payer: Self-pay | Admitting: Pulmonary Disease

## 2019-12-17 ENCOUNTER — Other Ambulatory Visit: Payer: Self-pay

## 2019-12-17 ENCOUNTER — Telehealth: Payer: Self-pay | Admitting: Pulmonary Disease

## 2019-12-17 VITALS — BP 132/76 | HR 53 | Temp 97.6°F | Ht 67.0 in | Wt 126.0 lb

## 2019-12-17 DIAGNOSIS — J479 Bronchiectasis, uncomplicated: Secondary | ICD-10-CM | POA: Diagnosis not present

## 2019-12-17 DIAGNOSIS — G4733 Obstructive sleep apnea (adult) (pediatric): Secondary | ICD-10-CM | POA: Diagnosis not present

## 2019-12-17 DIAGNOSIS — J9611 Chronic respiratory failure with hypoxia: Secondary | ICD-10-CM | POA: Diagnosis not present

## 2019-12-17 NOTE — Telephone Encounter (Signed)
Called and spoke with pt to make sure she was aware of the cpap titration appt and she verbalized that she had just received a call from [place], [facility]. Nothing further needed.

## 2019-12-17 NOTE — Patient Instructions (Signed)
Will arrange for CPAP titration study  Will call to schedule follow up after titration study reviewed 

## 2019-12-17 NOTE — Progress Notes (Signed)
[place] [facility], Critical [facility], [facility] Sleep Medicine  Chief Complaint  Patient presents with   Follow-up    go over sleep study result    Constitutional:  BP 132/76 (BP Location: Left Arm, Cuff Size: Normal)    Pulse (!) 53    Temp 97.6 F (36.4 C) (Other (Comment)) Comment (Src): wrist   Ht 5\' 7"  (1.702 m)    Wt 126 lb (57.2 kg)    SpO2 96% Comment: 1L pulse O2   BMI 19.73 kg/m   Past Medical History:  Hypothyroidism, HTN, OA, CKD 3a, HLD, Depression, Anxiety, CAD  Past Surgical History:  Her  has a past surgical history that includes Cardiac surgery; Abdominal hysterectomy; Tonsillectomy (1956); Bronchoscopy (2001); Colonoscopy (2008); Cyst removal trunk (1965); Dilation and curettage of uterus (1975); Hand surgery (Right, 1983); Patent ductus arterious repair (1952); and Varicose vein surgery (1972).  Brief Summary:  Stacy Sanchez is a 74 y.o. female with MAI in 2001 complicated by bronchiectasis with recurrent Pseudomonal infections.      Subjective:  She reported to her PCP symptoms of fatigue and sleep disruption.  She had in lab sleep study with [facility].  Found to have severe sleep apnea, but only had limited sleep time.  She was set up with auto CPAP.  She has been using oxygen with CPAP.    She has intermittent cough, mostly in the morning.  She is not having fever, wheeze, chest pain, or hemoptysis.  Physical Exam:   Appearance - well kempt, thin  ENMT - no sinus tenderness, no oral exudate, no LAN, Mallampati 3 airway, no stridor  Respiratory - equal breath sounds bilaterally, no wheezing or rales  CV - s1s2 regular rate and rhythm, no murmurs  Ext - no clubbing, no edema  Skin - no rashes  Psych - normal mood and affect   Chest Imaging:   CT angio chest 11/27/18 >> prominent adenopathy, mosaic attenuation, BTX, central airway thickening, mucus plugging, mild emphysema, density in RUL  CT chest 06/22/19 >> severe b/l cylindrical BTX, peribronchovascular  nodularity and peripheral mucoid impaction, scarring and volume lose in lingula, subpleural consolidation in RUL  Sleep Tests:   PSG 12/03/19 >> AHI 66, SpO2 low 75%.  Limited sleep time.  Micro data:   Sputum 11/30/18 >> Pseudomonas aeruginosa, pan-S  Social History:  She  reports that she has never smoked. She has never used smokeless tobacco. She reports that she does not drink alcohol and does not use drugs.  Family History:  Her family history includes Thyroid disease in her daughter.      Assessment/Plan:   Bronchiectasis with history of MAI and Pseudomonas. - continue hypertonic saline and duoneb - continue mucinex and flutter valve - will discuss f/u CT imaging at her next visit based on her symptom status  Chronic respiratory failure with hypoxia. - secondary to bronchiectasis - she uses 1.5 liters oxygen with exertion and at night  Obstructive sleep apnea. - reviewed her sleep study with her - discussed how her sleep study showed severe sleep apnea, but she had reduced sleep time during the study - given that she has respiratory failure needing supplemental oxygen, I think she needs to have an in lab titration study to determine whether she needs CPAP versus Bipap and how much supplemental oxygen she needs with PAP therapy - she would prefer to have sleep apnea management through [facility] - I have ordered a CPAP titration study  COVID advice. - discussed timing of COVID  vaccine booster, especially in relation to her influenza vaccination  Time Spent Involved in Patient Care on Day of Examination:  34 minutes  Follow up:  Patient Instructions  Will arrange for CPAP titration study  Will call to schedule follow up after titration study reviewed   Medication List:   Allergies as of 12/17/2019      Reactions   Amikacin Other (See Comments)   nephropathy    Cefepime Other (See Comments)   Tremors Slurred speech  AKI   Ethambutol Other (See Comments)    neuropathy   Levaquin [levofloxacin] Other (See Comments)   Fainting and weakness   Parafon Forte Dsc [chlorzoxazone] Hives   Rifabutin Diarrhea   Tequin [gatifloxacin] Other (See Comments)   Fainting and weakness   Zyvox [linezolid] Diarrhea      Medication List       Accurate as of December 17, 2019 12:58 PM. If you have any questions, ask your nurse or doctor.        STOP taking these medications   amLODipine 5 MG tablet Commonly known as: NORVASC Stopped by: Coralyn Helling, MD   bethanechol 10 MG tablet Commonly known as: URECHOLINE Stopped by: Coralyn Helling, MD   ferrous sulfate 325 (65 FE) MG tablet Stopped by: Coralyn Helling, MD   senna-docusate 8.6-50 MG tablet Commonly known as: Senokot-S Stopped by: Coralyn Helling, MD     TAKE these medications   acetaminophen 650 MG CR tablet Commonly known as: TYLENOL Take 1,300 mg by mouth every 8 (eight) hours as needed for pain.   aspirin EC 81 MG tablet Take 81 mg by mouth daily.   buPROPion 150 MG 24 hr tablet Commonly known as: WELLBUTRIN XL Take 150 mg by mouth every morning.   Compressor/Nebulizer Misc 1 Product by Does not apply route once for 1 dose.   escitalopram 10 MG tablet Commonly known as: LEXAPRO Take 10 mg by mouth daily.   gabapentin 300 MG capsule Commonly known as: NEURONTIN Take 300 mg by mouth 2 (two) times daily.   ipratropium-albuterol 0.5-2.5 (3) MG/3ML Soln Commonly known as: DUONEB Take 3 mLs by nebulization 3 (three) times daily as needed.   levothyroxine 50 MCG tablet Commonly known as: SYNTHROID Take 50-100 mcg by mouth See admin instructions. Take 1 tablet daily on Monday through Saturday and take 2 tablets on Saturday   multivitamin with minerals Tabs tablet Take 1 tablet by mouth daily.   nadolol 20 MG tablet Commonly known as: CORGARD Take 10 mg by mouth 2 (two) times daily.   polyethylene glycol powder 17 GM/SCOOP powder Commonly known as: GLYCOLAX/MIRALAX Take 17 g by mouth  daily as needed for moderate constipation.   rosuvastatin 10 MG tablet Commonly known as: CRESTOR Take 10 mg by mouth daily.   sodium chloride HYPERTONIC 3 % nebulizer solution Take 4 mLs by nebulization 2 (two) times daily as needed for other.       Signature:  Coralyn Helling, MD [facility]/Critical [facility] Pager - [phone] 12/17/2019, 12:58 PM

## 2019-12-28 ENCOUNTER — Encounter (HOSPITAL_BASED_OUTPATIENT_CLINIC_OR_DEPARTMENT_OTHER): Payer: Medicare HMO | Admitting: Pulmonary Disease

## 2019-12-28 ENCOUNTER — Telehealth: Payer: Self-pay | Admitting: Pulmonary Disease

## 2019-12-28 NOTE — Telephone Encounter (Signed)
Spoke to pt she is aware the study was denied and cancelled until we can get a peer to peer done by dr sood or one of our np's Tobe Sos

## 2019-12-29 ENCOUNTER — Ambulatory Visit (INDEPENDENT_AMBULATORY_CARE_PROVIDER_SITE_OTHER): Payer: Medicare HMO | Admitting: Primary Care

## 2019-12-29 DIAGNOSIS — G4733 Obstructive sleep apnea (adult) (pediatric): Secondary | ICD-10-CM

## 2019-12-29 NOTE — Patient Instructions (Signed)
Peer-to-peer approved for CPAP titration

## 2019-12-29 NOTE — Progress Notes (Signed)
PEER-TO PEER. Patient has hx severe OSA. Sleep study showed severe sleep apnea. PSG 12/03/19 at [facility] >> AHI 66, SpO2 low 75%. Limited sleep time. Given patient has respiratory failure needing supplemental oxygen patient was ordered in in-lab titration study to determine whether or not she needs CPAP versus BIPAP and how much oxygen she needs with PAP therapy.    Authorization approval - [id number]

## 2020-01-02 ENCOUNTER — Ambulatory Visit (HOSPITAL_BASED_OUTPATIENT_CLINIC_OR_DEPARTMENT_OTHER): Payer: Medicare HMO | Attending: Pulmonary Disease | Admitting: Pulmonary Disease

## 2020-01-02 ENCOUNTER — Other Ambulatory Visit: Payer: Self-pay

## 2020-01-02 DIAGNOSIS — J479 Bronchiectasis, uncomplicated: Secondary | ICD-10-CM | POA: Diagnosis not present

## 2020-01-02 DIAGNOSIS — G4733 Obstructive sleep apnea (adult) (pediatric): Secondary | ICD-10-CM | POA: Insufficient documentation

## 2020-01-02 DIAGNOSIS — G4736 Sleep related hypoventilation in conditions classified elsewhere: Secondary | ICD-10-CM | POA: Insufficient documentation

## 2020-01-02 DIAGNOSIS — J9611 Chronic respiratory failure with hypoxia: Secondary | ICD-10-CM | POA: Insufficient documentation

## 2020-01-02 DIAGNOSIS — Z79899 Other long term (current) drug therapy: Secondary | ICD-10-CM | POA: Diagnosis not present

## 2020-01-02 DIAGNOSIS — Z9989 Dependence on other enabling machines and devices: Secondary | ICD-10-CM | POA: Diagnosis not present

## 2020-01-06 ENCOUNTER — Telehealth: Payer: Self-pay | Admitting: Pulmonary Disease

## 2020-01-06 ENCOUNTER — Other Ambulatory Visit: Payer: Self-pay

## 2020-01-06 DIAGNOSIS — G4733 Obstructive sleep apnea (adult) (pediatric): Secondary | ICD-10-CM

## 2020-01-06 NOTE — Telephone Encounter (Signed)
Called and spoke with patient about CPAP titration study results per Dr Craige Cotta. Put in order for Cpap settings with 3L O2 with CPAP at night per Dr Craige Cotta. Explained this to patient. Patient stated she already has a CPAP machine and is going through [employer]. All questions answered and patient expressed understanding. Patient requesting copy of CPAP titration study to be sent by mail to her. Results sent by mail per patient request. Two month follow up scheduled for November 17th, 2021 at 11am in [place] with Dr Craige Cotta. Nothing further needed at this time.

## 2020-01-06 NOTE — Telephone Encounter (Signed)
CPAP titration 01/02/20 >> CPAP 7 cm H2O with 3 liters oxygen >> AHI 0.9, no REM.   Please let her know that she did well with CPAP during the titration study.  Please send order to arrange for her be set up with CPAP at 7 cm H2O with heated humidity and mask of choice.  She needs to have 3 liters oxygen run in with CPAP at night.  Please schedule ROV in 2 months after getting CPAP set up.

## 2020-01-06 NOTE — Procedures (Signed)
    Patient Name: Stacy Sanchez, Stacy Sanchez Date: 01/02/2020 Gender: Female D.O.B: 05-03-1945 Age (years): 61 Referring Provider: Coralyn Helling MD, ABSM Height (inches): 67 Interpreting Physician: Coralyn Helling MD, ABSM Weight (lbs): 125 RPSGT: Cherylann Parr BMI: 20 MRN: [id number] Neck Size: 13.00  CLINICAL INFORMATION The patient is referred for a CPAP titration to treat sleep apnea.  Date of NPSG 12/03/19: AHI 66, SpO2 low 75%.  SLEEP STUDY TECHNIQUE As per the AASM Manual for the Scoring of Sleep and Associated Events v2.3 (April 2016) with a hypopnea requiring 4% desaturations.  The channels recorded and monitored were frontal, central and occipital EEG, electrooculogram (EOG), submentalis EMG (chin), nasal and oral airflow, thoracic and abdominal wall motion, anterior tibialis EMG, snore microphone, electrocardiogram, and pulse oximetry. Continuous positive airway pressure (CPAP) was initiated at the beginning of the study and titrated to treat sleep-disordered breathing.  MEDICATIONS Medications self-administered by patient taken the night of the study : AMBIEN  TECHNICIAN COMMENTS Comments added by technician: O2 initiated due to low sats. 3 LITERS OF OXYGEN WAS ADDED Comments added by scorer: N/A  RESPIRATORY PARAMETERS Optimal PAP Pressure (cm): 7 AHI at Optimal Pressure (/hr): 0.9 Overall Minimal O2 (%): 77.0 Supine % at Optimal Pressure (%): 5 Minimal O2 at Optimal Pressure (%): 77.0   SLEEP ARCHITECTURE The study was initiated at 10:42:01 PM and ended at 4:43:51 AM.  Sleep onset time was 3.9 minutes and the sleep efficiency was 90.6%%. The total sleep time was 328 minutes.  The patient spent 3.7%% of the night in stage N1 sleep, 96.3%% in stage N2 sleep, 0.0%% in stage N3 and 0% in REM.Stage REM latency was N/A minutes  Wake after sleep onset was 30.0. Alpha intrusion was absent. Supine sleep was 4.41%.  CARDIAC DATA The 2 lead EKG demonstrated sinus rhythm. The  mean heart rate was 55.3 beats per minute. Other EKG findings include: None.  LEG MOVEMENT DATA The total Periodic Limb Movements of Sleep (PLMS) were 0. The PLMS index was 0.0. A PLMS index of <15 is considered normal in adults.  IMPRESSIONS - She did well with CPAP 7 cm H2O. - She had continued oxygen desaturation in the abscence of obstructive respiratory events and this lasted more than 5 minutes.  Her oxygenation stabilized once she had 3 liters supplemental oxygen added to CPAP 7 cm H2O.  DIAGNOSIS - Obstructive Sleep Apnea  - Nocturnal Hypoxemia  RECOMMENDATIONS - Trial of CPAP therapy on 7 cm H2O with 3 liters supplemental oxygen. - She was fitted with a Medium size Resmed Full Face Mask AirFit F30i mask and heated humidification.  [Electronically signed] 01/06/2020 09:06 AM  Coralyn Helling MD, ABSM Diplomate, American Board of Sleep Medicine   NPI: [id number]

## 2020-01-25 ENCOUNTER — Telehealth: Payer: Self-pay | Admitting: Pulmonary Disease

## 2020-01-25 DIAGNOSIS — J479 Bronchiectasis, uncomplicated: Secondary | ICD-10-CM

## 2020-01-25 NOTE — Telephone Encounter (Signed)
Tried calling Lawson Fiscal and there was no answer so LMTCB.

## 2020-01-25 NOTE — Telephone Encounter (Signed)
Per pt daughter, pt is in hospital due to fall,lots of tests being done to see why. Did lung scan, looks worse.  MD at hospital wants to give abx, family wants Infectious disease to see her after d/c from hospital. VS had mentioned that pt needed to see Dr Orvan Falconer- Infectious Disease specialist. Pt and family had held off and now wants a referral. Please advise. [phone]

## 2020-01-27 NOTE — Telephone Encounter (Signed)
Spoke with daughter Lawson Fiscal, states that pt is currently admitted to [facility] after a fall related to a heart issue.  Pt is wanting to follow up with Infectious Disease after discharge.  Per 09/24/19 note, Dr. Craige Cotta had wanted pt to follow up with Dr. Orvan Falconer.  Daughter is requesting that we place a referral to Dr. Orvan Falconer after discharge.    VS ok to place referral?  Thanks!

## 2020-01-28 NOTE — Telephone Encounter (Signed)
I think Dr. Orvan Falconer is retiring soon.  Please check with infectious disease office.  If he is still seeing patients, then okay to send referral to have her see Dr. Orvan Falconer.

## 2020-01-28 NOTE — Telephone Encounter (Signed)
Referral sent to [facility] for pt to see Dr Orvan Falconer if he has not retired

## 2020-01-29 DIAGNOSIS — R55 Syncope and collapse: Secondary | ICD-10-CM | POA: Insufficient documentation

## 2020-01-31 DIAGNOSIS — I428 Other cardiomyopathies: Secondary | ICD-10-CM | POA: Insufficient documentation

## 2020-02-01 ENCOUNTER — Telehealth (HOSPITAL_COMMUNITY): Payer: Self-pay | Admitting: Vascular Surgery

## 2020-02-01 HISTORY — PX: ICD IMPLANT: EP1208

## 2020-02-01 NOTE — Telephone Encounter (Signed)
Left pt daughter message to see db, 1st week of Nov 9:40 appt OR 3:20

## 2020-02-15 ENCOUNTER — Ambulatory Visit: Payer: Medicare HMO | Admitting: Internal Medicine

## 2020-02-22 NOTE — Progress Notes (Signed)
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 History also notable for severe OSA, patent ductus repair at age 39. She has been referred by Lenis Noon, PA at [facility] for further evaluation of new-onset systolic HF,    She was admitted to [facility] in 10/21 with syncopal spell and diagnosed with new nonischemic cardiomyopathy. Echocardiogram showed EF 20-25%, normal RV, LVIDD 4.6 cm. (Previous echocardiogram 2019 showed EF 50-55%, and either a torn chordae or redundant chordae by report.) Cardiac cath revealed no CAD. EKG showed sinus rhythm with T wave abnormalities with narrow QRS. CT and MRI of head were unremarkable. EP was consulted and ICD was placed. Post procedure she developed a hematoma. She was initially placed on Entresto, Aldactone and Jardiance along with beta-blocker. She developed symptomatic hypotension and her medications had to be changed. Currently on low-dose beta-blocker and ACE. Not requiring a diuretic.   Here with her daughter who teaches Kindergarten at [employer]. Prior to syncopal episode, not very active and SOB with mild activity. Since d/c she remains fatigued. SOB with minimal activities. No edema, orthopnea or PND. Frequent cough.  BP has been labile in past.    Review of Systems: [y] = yes, _0  = no   General: Weight gain _1 ; Weight loss Blue.Reese ]; Anorexia _2 ; Fatigue Blue.Reese ]; Fever _3 ; Chills _4 ; Weakness Blue.Reese ]  Cardiac: Chest pain/pressure _5 ; Resting SOB Blue.Reese ]; Exertional SOB [ y]; Orthopnea _6 ; Pedal Edema _7 ; Palpitations _8 ; Syncope _9 ; Presyncope _10 ; Paroxysmal nocturnal dyspnea_11   Pulmonary: Cough [ y]; Wheezing_12 ; Hemoptysis_13 ; Sputum _14 ; Snoring [ y]  GI: Vomiting_15 ; Dysphagia_16 ; Melena_17 ; Hematochezia _18 ; Heartburn_19 ; Abdominal pain _20 ;  Constipation _21 ; Diarrhea _22 ; BRBPR _23   GU: Hematuria_24 ; Dysuria _25 ; Nocturia_26   Vascular: Pain in legs with walking _27 ; Pain in feet with lying flat _28 ; Non-healing sores _29 ; Stroke _30 ; TIA _31 ; Slurred speech _32 ;  Neuro: Headaches_33 ; Vertigo_34 ; Seizures_35 ; Paresthesias_36 ;Blurred vision _37 ; Diplopia _38 ; Vision changes _39   Ortho/Skin: Arthritis Blue.Reese ]; Joint pain Blue.Reese ]; Muscle pain _40 ; Joint swelling _41 ; Back Pain _42 ; Rash _43   Psych: Depression[y ]; Anxiety[y ]  Heme: Bleeding problems _44 ; Clotting disorders _45 ; Anemia _46   Endocrine: Diabetes _47 ; Thyroid dysfunction[y ]   Past Medical History:  Diagnosis Date  . Arthritis   . Hypertension   . Mycobacterium abscessus infection   . Mycobacterium avium complex ([place])   . Pseudomonas respiratory infection   . Thyroid disease     Current Outpatient Medications  Medication Sig Dispense Refill  . acetaminophen (TYLENOL) 650 MG CR tablet Take 1,300 mg by mouth every 8 (eight) hours as needed for pain.     Marland Kitchen aspirin EC 81 MG tablet Take 81 mg by mouth daily.    Marland Kitchen buPROPion (WELLBUTRIN XL) 150 MG 24 hr tablet Take 150 mg by mouth every morning.    . gabapentin (NEURONTIN) 300 MG capsule Take 300 mg by mouth 2 (two) times daily.    Marland Kitchen ipratropium-albuterol (DUONEB) 0.5-2.5 (3) MG/3ML SOLN Take 3 mLs by nebulization 3 (three) times daily as  needed. 810 mL 1  . levothyroxine (SYNTHROID, LEVOTHROID) 50 MCG tablet Take 50-100 mcg by mouth See admin instructions. Take 1 tablet daily on Monday through Saturday and take 2 tablets on Saturday    . lisinopril (ZESTRIL) 5 MG tablet Take 2.5 mg by mouth daily.    . metoprolol tartrate (LOPRESSOR) 25 MG tablet Take 25 mg by mouth daily. ER Succonate    . Multiple Vitamin (MULTIVITAMIN WITH MINERALS) TABS tablet Take 1 tablet by mouth daily.    . Nebulizers (COMPRESSOR/NEBULIZER) MISC 1 Product by Does not apply route once for 1 dose. 1 each 0  . OXYGEN Inhale into the lungs. As needed.  2l during day.    . polyethylene glycol powder (GLYCOLAX/MIRALAX) 17 GM/SCOOP powder Take 17 g by mouth daily as needed for moderate constipation.     . rosuvastatin (CRESTOR) 10 MG tablet Take 10 mg by mouth daily.    . sodium chloride HYPERTONIC 3 % nebulizer solution Take 4 mLs by nebulization 2 (two) times daily as needed for other. 720 mL 2  . escitalopram (LEXAPRO) 10 MG tablet Take 10 mg by mouth daily.     No current facility-administered medications for this encounter.    Allergies  Allergen Reactions  . Amikacin Other (See Comments)    nephropathy   . Cefepime Other (See Comments)    Tremors Slurred speech  AKI  . Ethambutol Other (See Comments)    neuropathy  . Levaquin [Levofloxacin] Other (See Comments)    Fainting and weakness  . Parafon Forte Dsc [Chlorzoxazone] Hives  . Rifabutin Diarrhea  . Tequin [Gatifloxacin] Other (See Comments)    Fainting and weakness  . Zyvox [Linezolid] Diarrhea      Social History   Socioeconomic History  . Marital status: Widowed    Spouse name: Not on file  . Number of children: Not on file  . Years of education: Not on file  . Highest education level: Not on file  Occupational History  . Not on file  Tobacco Use  . Smoking status: Never Smoker  . Smokeless tobacco: Never Used  Substance and Sexual Activity  . Alcohol use: No  . Drug use: No  . Sexual activity: Not on file  Other Topics Concern  . Not on file  Social History Narrative  . Not on file   Social Determinants of Health   Financial Resource Strain:   . Difficulty of Paying Living Expenses: Not on file  Food Insecurity:   . Worried About Charity fundraiser in the Last Year: Not on file  . Ran Out of Food in the Last Year: Not on file  Transportation Needs:   . Lack of Transportation (Medical): Not on file  . Lack of Transportation (Non-Medical): Not on file  Physical Activity:   . Days of Exercise per Week: Not on file  . Minutes of Exercise per  Session: Not on file  Stress:   . Feeling of Stress : Not on file  Social Connections:   . Frequency of Communication with Friends and Family: Not on file  . Frequency of Social Gatherings with Friends and Family: Not on file  . Attends Religious Services: Not on file  . Active Member of Clubs or Organizations: Not on file  . Attends Archivist Meetings: Not on file  . Marital Status: Not on file  Intimate Partner Violence:   . Fear of Current or Ex-Partner: Not on file  . Emotionally Abused: Not  on file  . Physically Abused: Not on file  . Sexually Abused: Not on file      Family History  Problem Relation Age of Onset  . Thyroid disease Daughter     Vitals:   02/24/20 0946  BP: 112/70  Pulse: 93  SpO2: 96%  Weight: 53.6 kg (118 lb 3.2 oz)    PHYSICAL EXAM: General:  Weak frail appearing. No respiratory difficulty HEENT: normal Neck: supple. no JVD. Carotids 2+ bilat; no bruits. No lymphadenopathy or thryomegaly appreciated. Cor: PMI nondisplaced. Regular rate & rhythm. No rubs, gallops or murmurs. Small hematoma over ICD Lungs: clear Abdomen: soft, nontender, nondistended. No hepatosplenomegaly. No bruits or masses. Good bowel sounds. Extremities: no cyanosis, clubbing, rash, edema Neuro: alert & oriented x 3, cranial nerves grossly intact. moves all 4 extremities w/o difficulty. Affect pleasant.  ECG:  NSR narrow qrs. Personally reviewed   ASSESSMENT & PLAN:  1. 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 Echo at [facility] EF 25-30%  - etiology unclear  - cath at [facility] 10/21 no CAD.  - s/p Biotronik ICD in setting of syncopal episode with presumed VT - NYHA III-IIIB - Volume status ok - GDT limited bt low BP initially - On lisinopril 2.5 daily but unable to tolerate due to cough. Switch to losartan 25 qhs - Continue toprol 25 daily - Add digoxin 0.125  - Would like to add spiro but K has been 4.7-5.0. Recheck.  - Etiology of NICM unclear. Ideally  would have liked cMRI to look for infiltrative process to explain cardiomyopathy and possible VT but unable to do this with ICD now present - Focus on titration of GDMT. Changes as above.   - F/u pharmd then see me in 2 months with echo.    2. H/o syncope - ICD placed 10/21 at [facility]  3. 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 - followed by Dr. Halford Chessman  4. Severe OSA - followed by Dr. Halford Chessman  5. H/o patent ductus repair   Glori Bickers, MD  11:15 PM

## 2020-02-22 NOTE — H&P (View-Only) (Signed)
 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 History also notable for severe OSA, patent ductus repair at age 5. She has been referred by Stacy Crawford, PA at [facility] for further evaluation of new-onset systolic HF,    She was admitted to [facility] in 10/21 with syncopal spell and diagnosed with new nonischemic cardiomyopathy. Echocardiogram showed EF 20-25%, normal RV, LVIDD 4.6 cm. (Previous echocardiogram 2019 showed EF 50-55%, and either a torn chordae or redundant chordae by report.) Cardiac cath revealed no CAD. EKG showed sinus rhythm with T wave abnormalities with narrow QRS. CT and MRI of head were unremarkable. EP was consulted and ICD was placed. Post procedure she developed a hematoma. She was initially placed on Entresto, Aldactone and Jardiance along with beta-blocker. She developed symptomatic hypotension and her medications had to be changed. Currently on low-dose beta-blocker and ACE. Not requiring a diuretic.   Here with her daughter who teaches Kindergarten at [employer]. Prior to syncopal episode, not very active and SOB with mild activity. Since d/c she remains fatigued. SOB with minimal activities. No edema, orthopnea or PND. Frequent cough.  BP has been labile in past.    Review of Systems: [y] = yes, [ ] = no   General: Weight gain [ ]; Weight loss [y ]; Anorexia [ ]; Fatigue [y ]; Fever [ ]; Chills [ ]; Weakness [y ]  Cardiac: Chest pain/pressure [ ]; Resting SOB [y ]; Exertional SOB [ y]; Orthopnea [ ]; Pedal Edema [ ]; Palpitations [ ]; Syncope [ ]; Presyncope [ ]; Paroxysmal nocturnal dyspnea[ ]  Pulmonary: Cough [ y]; Wheezing[ ]; Hemoptysis[ ]; Sputum [ ]; Snoring [ y]  GI: Vomiting[ ]; Dysphagia[ ]; Melena[ ]; Hematochezia [ ]; Heartburn[ ]; Abdominal pain [ ];  Constipation [ ]; Diarrhea [ ]; BRBPR [ ]  GU: Hematuria[ ]; Dysuria [ ]; Nocturia[ ]  Vascular: Pain in legs with walking [ ]; Pain in feet with lying flat [ ]; Non-healing sores [ ]; Stroke [ ]; TIA [ ]; Slurred speech [ ];  Neuro: Headaches[ ]; Vertigo[ ]; Seizures[ ]; Paresthesias[ ];Blurred vision [ ]; Diplopia [ ]; Vision changes [ ]  Ortho/Skin: Arthritis [y ]; Joint pain [y ]; Muscle pain [ ]; Joint swelling [ ]; Back Pain [ ]; Rash [ ]  Psych: Depression[y ]; Anxiety[y ]  Heme: Bleeding problems [ ]; Clotting disorders [ ]; Anemia [ ]  Endocrine: Diabetes [ ]; Thyroid dysfunction[y ]   Past Medical History:  Diagnosis Date  . Arthritis   . Hypertension   . Mycobacterium abscessus infection   . Mycobacterium avium complex ([facility])   . Pseudomonas respiratory infection   . Thyroid disease     Current Outpatient Medications  Medication Sig Dispense Refill  . acetaminophen (TYLENOL) 650 MG CR tablet Take 1,300 mg by mouth every 8 (eight) hours as needed for pain.     . aspirin EC 81 MG tablet Take 81 mg by mouth daily.    . buPROPion (WELLBUTRIN XL) 150 MG 24 hr tablet Take 150 mg by mouth every morning.    . gabapentin (NEURONTIN) 300 MG capsule Take 300 mg by mouth 2 (two) times daily.    . ipratropium-albuterol (DUONEB) 0.5-2.5 (3) MG/3ML SOLN Take 3 mLs by nebulization 3 (three) times daily as   needed. 810 mL 1  . levothyroxine (SYNTHROID, LEVOTHROID) 50 MCG tablet Take 50-100 mcg by mouth See admin instructions. Take 1 tablet daily on Monday through Saturday and take 2 tablets on Saturday    . lisinopril (ZESTRIL) 5 MG tablet Take 2.5 mg by mouth daily.    . metoprolol tartrate (LOPRESSOR) 25 MG tablet Take 25 mg by mouth daily. ER Succonate    . Multiple Vitamin (MULTIVITAMIN WITH MINERALS) TABS tablet Take 1 tablet by mouth daily.    . Nebulizers (COMPRESSOR/NEBULIZER) MISC 1 Product by Does not apply route once for 1 dose. 1 each 0  . OXYGEN Inhale into the lungs. As needed.  2l during day.    . polyethylene glycol powder (GLYCOLAX/MIRALAX) 17 GM/SCOOP powder Take 17 g by mouth daily as needed for moderate constipation.     . rosuvastatin (CRESTOR) 10 MG tablet Take 10 mg by mouth daily.    . sodium chloride HYPERTONIC 3 % nebulizer solution Take 4 mLs by nebulization 2 (two) times daily as needed for other. 720 mL 2  . escitalopram (LEXAPRO) 10 MG tablet Take 10 mg by mouth daily.     No current facility-administered medications for this encounter.    Allergies  Allergen Reactions  . Amikacin Other (See Comments)    nephropathy   . Cefepime Other (See Comments)    Tremors Slurred speech  AKI  . Ethambutol Other (See Comments)    neuropathy  . Levaquin [Levofloxacin] Other (See Comments)    Fainting and weakness  . Parafon Forte Dsc [Chlorzoxazone] Hives  . Rifabutin Diarrhea  . Tequin [Gatifloxacin] Other (See Comments)    Fainting and weakness  . Zyvox [Linezolid] Diarrhea      Social History   Socioeconomic History  . Marital status: Widowed    Spouse name: Not on file  . Number of children: Not on file  . Years of education: Not on file  . Highest education level: Not on file  Occupational History  . Not on file  Tobacco Use  . Smoking status: Never Smoker  . Smokeless tobacco: Never Used  Substance and Sexual Activity  . Alcohol use: No  . Drug use: No  . Sexual activity: Not on file  Other Topics Concern  . Not on file  Social History Narrative  . Not on file   Social Determinants of Health   Financial Resource Strain:   . Difficulty of Paying Living Expenses: Not on file  Food Insecurity:   . Worried About Running Out of Food in the Last Year: Not on file  . Ran Out of Food in the Last Year: Not on file  Transportation Needs:   . Lack of Transportation (Medical): Not on file  . Lack of Transportation (Non-Medical): Not on file  Physical Activity:   . Days of Exercise per Week: Not on file  . Minutes of Exercise per  Session: Not on file  Stress:   . Feeling of Stress : Not on file  Social Connections:   . Frequency of Communication with Friends and Family: Not on file  . Frequency of Social Gatherings with Friends and Family: Not on file  . Attends Religious Services: Not on file  . Active Member of Clubs or Organizations: Not on file  . Attends Club or Organization Meetings: Not on file  . Marital Status: Not on file  Intimate Partner Violence:   . Fear of Current or Ex-Partner: Not on file  . Emotionally Abused: Not   on file  . Physically Abused: Not on file  . Sexually Abused: Not on file      Family History  Problem Relation Age of Onset  . Thyroid disease Daughter     Vitals:   02/24/20 0946  BP: 112/70  Pulse: 93  SpO2: 96%  Weight: 53.6 kg (118 lb 3.2 oz)    PHYSICAL EXAM: General:  Weak frail appearing. No respiratory difficulty HEENT: normal Neck: supple. no JVD. Carotids 2+ bilat; no bruits. No lymphadenopathy or thryomegaly appreciated. Cor: PMI nondisplaced. Regular rate & rhythm. No rubs, gallops or murmurs. Small hematoma over ICD Lungs: clear Abdomen: soft, nontender, nondistended. No hepatosplenomegaly. No bruits or masses. Good bowel sounds. Extremities: no cyanosis, clubbing, rash, edema Neuro: alert & oriented x 3, cranial nerves grossly intact. moves all 4 extremities w/o difficulty. Affect pleasant.  ECG:  NSR narrow qrs. Personally reviewed   ASSESSMENT & PLAN:  1. 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 Echo at [facility] EF 25-30%  - etiology unclear  - cath at [facility] 10/21 no CAD.  - s/p Biotronik ICD in setting of syncopal episode with presumed VT - NYHA III-IIIB - Volume status ok - GDT limited bt low BP initially - On lisinopril 2.5 daily but unable to tolerate due to cough. Switch to losartan 25 qhs - Continue toprol 25 daily - Add digoxin 0.125  - Would like to add spiro but K has been 4.7-5.0. Recheck.  - Etiology of NICM unclear. Ideally  would have liked cMRI to look for infiltrative process to explain cardiomyopathy and possible VT but unable to do this with ICD now present - Focus on titration of GDMT. Changes as above.   - F/u pharmd then see me in 2 months with echo.    2. H/o syncope - ICD placed 10/21 at [facility]  3. 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 - followed by Stacy Sanchez  4. Severe OSA - followed by Stacy Sanchez  5. H/o patent ductus repair   Stacy Bickers, MD  11:15 PM

## 2020-02-24 ENCOUNTER — Other Ambulatory Visit: Payer: Self-pay

## 2020-02-24 ENCOUNTER — Ambulatory Visit (HOSPITAL_COMMUNITY)
Admission: RE | Admit: 2020-02-24 | Discharge: 2020-02-24 | Disposition: A | Discharge status: Home / Self Care | Payer: Medicare HMO | Source: Ambulatory Visit | Attending: Internal Medicine | Admitting: Internal Medicine

## 2020-02-24 ENCOUNTER — Encounter (HOSPITAL_COMMUNITY): Payer: Self-pay | Admitting: Internal Medicine

## 2020-02-24 VITALS — BP 112/70 | HR 93 | Wt 118.2 lb

## 2020-02-24 DIAGNOSIS — J9611 Chronic respiratory failure with hypoxia: Secondary | ICD-10-CM | POA: Insufficient documentation

## 2020-02-24 DIAGNOSIS — Z881 Allergy status to other antibiotic agents status: Secondary | ICD-10-CM | POA: Insufficient documentation

## 2020-02-24 DIAGNOSIS — Z9981 Dependence on supplemental oxygen: Secondary | ICD-10-CM | POA: Diagnosis not present

## 2020-02-24 DIAGNOSIS — Z8619 Personal history of other infectious and parasitic diseases: Secondary | ICD-10-CM | POA: Insufficient documentation

## 2020-02-24 DIAGNOSIS — I110 Hypertensive heart disease with heart failure: Secondary | ICD-10-CM | POA: Diagnosis present

## 2020-02-24 DIAGNOSIS — Z79899 Other long term (current) drug therapy: Secondary | ICD-10-CM | POA: Insufficient documentation

## 2020-02-24 DIAGNOSIS — Z79890 Hormone replacement therapy (postmenopausal): Secondary | ICD-10-CM | POA: Insufficient documentation

## 2020-02-24 DIAGNOSIS — Z7982 Long term (current) use of aspirin: Secondary | ICD-10-CM | POA: Diagnosis not present

## 2020-02-24 DIAGNOSIS — I428 Other cardiomyopathies: Secondary | ICD-10-CM | POA: Diagnosis not present

## 2020-02-24 DIAGNOSIS — I5021 Acute systolic (congestive) heart failure: Secondary | ICD-10-CM | POA: Insufficient documentation

## 2020-02-24 DIAGNOSIS — G4733 Obstructive sleep apnea (adult) (pediatric): Secondary | ICD-10-CM | POA: Insufficient documentation

## 2020-02-24 DIAGNOSIS — I5022 Chronic systolic (congestive) heart failure: Secondary | ICD-10-CM

## 2020-02-24 DIAGNOSIS — Z888 Allergy status to other drugs, medicaments and biological substances status: Secondary | ICD-10-CM | POA: Insufficient documentation

## 2020-02-24 DIAGNOSIS — R55 Syncope and collapse: Secondary | ICD-10-CM | POA: Diagnosis not present

## 2020-02-24 DIAGNOSIS — Z95810 Presence of automatic (implantable) cardiac defibrillator: Secondary | ICD-10-CM | POA: Diagnosis not present

## 2020-02-24 HISTORY — DX: Heart failure, unspecified: I50.9

## 2020-02-24 LAB — CBC
HCT: 38.1 % (ref 36.0–46.0)
Hemoglobin: 11.6 g/dL — ABNORMAL LOW (ref 12.0–15.0)
MCH: 29.8 pg (ref 26.0–34.0)
MCHC: 30.4 g/dL (ref 30.0–36.0)
MCV: 97.9 fL (ref 80.0–100.0)
Platelets: 189 10*3/uL (ref 150–400)
RBC: 3.89 MIL/uL (ref 3.87–5.11)
RDW: 15.1 % (ref 11.5–15.5)
WBC: 7.1 10*3/uL (ref 4.0–10.5)
nRBC: 0 % (ref 0.0–0.2)

## 2020-02-24 LAB — COMPREHENSIVE METABOLIC PANEL
ALT: 13 U/L (ref 0–44)
AST: 22 U/L (ref 15–41)
Albumin: 3.7 g/dL (ref 3.5–5.0)
Alkaline Phosphatase: 45 U/L (ref 38–126)
Anion gap: 8 (ref 5–15)
BUN: 29 mg/dL — ABNORMAL HIGH (ref 8–23)
CO2: 28 mmol/L (ref 22–32)
Calcium: 9.5 mg/dL (ref 8.9–10.3)
Chloride: 102 mmol/L (ref 98–111)
Creatinine, Ser: 1.4 mg/dL — ABNORMAL HIGH (ref 0.44–1.00)
GFR, Estimated: 39 mL/min — ABNORMAL LOW (ref >60)
Glucose, Bld: 118 mg/dL — ABNORMAL HIGH (ref 70–99)
Potassium: 4.6 mmol/L (ref 3.5–5.1)
Sodium: 138 mmol/L (ref 135–145)
Total Bilirubin: 0.8 mg/dL (ref 0.3–1.2)
Total Protein: 7.5 g/dL (ref 6.5–8.1)

## 2020-02-24 LAB — BRAIN NATRIURETIC PEPTIDE: B Natriuretic Peptide: 193.2 pg/mL — ABNORMAL HIGH (ref 0.0–100.0)

## 2020-02-24 MED ORDER — FUROSEMIDE 20 MG PO TABS: 20.0000 mg | ORAL_TABLET | Freq: Every day | ORAL | 6 refills | Status: DC | PRN

## 2020-02-24 MED ORDER — LOSARTAN POTASSIUM 25 MG PO TABS: 25.0000 mg | ORAL_TABLET | Freq: Every day | ORAL | 3 refills | Status: DC

## 2020-02-24 MED ORDER — METOPROLOL SUCCINATE ER 25 MG PO TB24: 25.0000 mg | ORAL_TABLET | Freq: Every day | ORAL | Status: DC

## 2020-02-24 MED ORDER — DIGOXIN 125 MCG PO TABS: 0.1250 mg | ORAL_TABLET | Freq: Every day | ORAL | 3 refills | Status: DC

## 2020-02-24 NOTE — Patient Instructions (Signed)
Stop Lisinopril  Start Losartan 25 mg Daily at Bedtime  Start Digoxin 0.125 mg Daily  Take Furosemide 20 mg AS NEEDED  Labs done today, your results will be available in MyChart, we will contact you for abnormal readings.  Please follow up with our heart failure pharmacist in 2-3 weeks  Your physician recommends that you schedule a follow-up appointment in: 2 months with echocardiogram  If you have any questions or concerns before your next appointment please send [place] a message through [place] or call our office at [phone].    TO LEAVE A MESSAGE FOR THE NURSE SELECT OPTION 2, PLEASE LEAVE A MESSAGE INCLUDING: . YOUR NAME . DATE OF BIRTH . CALL BACK NUMBER . REASON FOR CALL**this is important as we prioritize the call backs  YOU WILL RECEIVE A CALL BACK THE SAME DAY AS LONG AS YOU CALL BEFORE 4:00 PM  At the [facility], you and your health needs are our priority. As part of our continuing mission to provide you with exceptional heart care, we have created designated Provider Care Teams. These Care Teams include your primary Cardiologist (physician) and Advanced Practice Providers (APPs- Physician Assistants and Nurse Practitioners) who all work together to provide you with the care you need, when you need it.   You may see any of the following providers on your designated Care Team at your next follow up: Marland Kitchen Dr Arvilla Meres . Dr Marca Ancona . Tonye Becket, NP . Robbie Lis, PA . Karle Plumber, PharmD   Please be sure to bring in all your medications bottles to every appointment.

## 2020-02-29 ENCOUNTER — Other Ambulatory Visit (HOSPITAL_COMMUNITY): Payer: Self-pay | Admitting: *Deleted

## 2020-02-29 MED ORDER — METOPROLOL SUCCINATE ER 25 MG PO TB24
25.0000 mg | ORAL_TABLET | Freq: Every day | ORAL | 3 refills | Status: DC
Start: 2020-02-29 — End: 2020-05-01

## 2020-03-01 ENCOUNTER — Telehealth (HOSPITAL_COMMUNITY): Payer: Self-pay | Admitting: *Deleted

## 2020-03-01 ENCOUNTER — Other Ambulatory Visit: Payer: Self-pay

## 2020-03-01 ENCOUNTER — Ambulatory Visit (HOSPITAL_COMMUNITY)
Admission: RE | Admit: 2020-03-01 | Discharge: 2020-03-01 | Disposition: A | Discharge status: Home / Self Care | Payer: Medicare HMO | Source: Ambulatory Visit | Attending: Internal Medicine | Admitting: Internal Medicine

## 2020-03-01 DIAGNOSIS — R000 Tachycardia, unspecified: Secondary | ICD-10-CM

## 2020-03-01 NOTE — Telephone Encounter (Signed)
Place Zio patch for 14 days. Ok to continue exercise as long as HR comes back down with rest breaks.

## 2020-03-01 NOTE — Telephone Encounter (Signed)
Pt aware. Pts daughter otw to the office so that I can show her how to place zio patch on pt.

## 2020-03-01 NOTE — Telephone Encounter (Signed)
Pts HHRN called back to report physical therapy saw pt today and her heart rate was in the 130s they are concerned about continuing physical therapy with her heart rate that high.  RN asked if we needed to make any medication changes.    Routed to Dr.Bensimhon for advice

## 2020-03-01 NOTE — Telephone Encounter (Signed)
Stacy Sanchez w/[facility] left vm reporting pts heart rate is 120bpm at rest. Pt also reports fatigue. All other vitals normal and no other complaints at this time. Per Stacy Sanchez patient requests a return call. I called pt at [phone] no answer/no vm.   Stacy Sanchez call back # [phone]

## 2020-03-07 ENCOUNTER — Encounter: Payer: Self-pay | Admitting: Internal Medicine

## 2020-03-07 ENCOUNTER — Other Ambulatory Visit: Payer: Self-pay

## 2020-03-07 ENCOUNTER — Ambulatory Visit (INDEPENDENT_AMBULATORY_CARE_PROVIDER_SITE_OTHER): Payer: Medicare HMO | Admitting: Internal Medicine

## 2020-03-07 DIAGNOSIS — A310 Pulmonary mycobacterial infection: Secondary | ICD-10-CM | POA: Diagnosis not present

## 2020-03-07 DIAGNOSIS — A498 Other bacterial infections of unspecified site: Secondary | ICD-10-CM

## 2020-03-07 NOTE — Assessment & Plan Note (Signed)
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  He does not sound like she has worsening pulmonary symptoms indicating a recurrence at this time.  Her most recent AFB cultures were negative.  I will obtain repeat cultures now and see her back in 6 weeks.  With her multiple medication intolerances and allergies it would be impossible to craft a curative regimen if she does develop a recurrence.

## 2020-03-07 NOTE — Telephone Encounter (Signed)
pts daughter called to report pts heart still 104-130s. Pt is very tired and can only walk with physical therapy for about without being exhausted and out of breath. Daughter asked if we can make any medication changes since this has been going on about 9 days.   Routed to Dr.Bensimhon

## 2020-03-07 NOTE — Progress Notes (Signed)
[facility] for Infectious Disease  Patient Active Problem List   Diagnosis Date Noted  . AKI (acute kidney injury) ([place]) 07/14/2019    Priority: High  . Acute metabolic encephalopathy 29/92/4268    Priority: High  . 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 Altered mental status   . Thrombocytopenia ([place]) 07/14/2019  . Hypothyroidism 07/14/2019  . Chronic respiratory failure with hypoxia ([place]) 12/07/2018  . Bronchiectasis ([place]) 11/27/2018  . 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 Thrush 11/27/2018  . CAD (coronary artery disease) 11/27/2018  . CKD (chronic kidney disease), stage III ([place]) 11/27/2018  . Essential hypertension 11/27/2018  . Generalized anxiety disorder 11/27/2018  . Peripheral neuropathy 11/27/2018  . 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    ASPIRIN EC 81 MG TABLET    Take 81 mg by mouth daily.   BUPROPION (WELLBUTRIN XL) 150 MG 24 HR TABLET    Take 150 mg by mouth every morning.   DIGOXIN (LANOXIN) 0.125 MG TABLET    Take 1 tablet (0.125 mg total) by mouth daily.   ESCITALOPRAM (LEXAPRO) 10 MG TABLET    Take 10 mg by mouth daily.   FUROSEMIDE (LASIX) 20 MG TABLET    Take 1 tablet (20 mg total) by mouth daily as needed.   GABAPENTIN (NEURONTIN) 300 MG CAPSULE    Take 300 mg by mouth 2 (two) times daily.   IPRATROPIUM-ALBUTEROL (DUONEB) 0.5-2.5 (3) MG/3ML SOLN    Take 3 mLs by nebulization 3 (three) times daily as needed.   LEVOTHYROXINE (SYNTHROID, LEVOTHROID) 50 MCG TABLET    Take 50-100 mcg by mouth See admin instructions. Take 1 tablet daily on Monday through Saturday and take 2 tablets on Saturday   LOSARTAN (COZAAR) 25 MG TABLET    Take 1 tablet (25 mg total) by mouth at bedtime.   METOPROLOL SUCCINATE (TOPROL XL) 25 MG 24 HR  TABLET    Take 1 tablet (25 mg total) by mouth daily.   MULTIPLE VITAMIN (MULTIVITAMIN WITH MINERALS) TABS TABLET    Take 1 tablet by mouth daily.   NEBULIZERS (COMPRESSOR/NEBULIZER) MISC    1 Product by Does not apply route once for 1 dose.   OXYGEN    Inhale into the lungs. As needed. 2l during day.   POLYETHYLENE GLYCOL POWDER (GLYCOLAX/MIRALAX) 17 GM/SCOOP POWDER    Take 17 g by mouth daily as needed for moderate constipation.    ROSUVASTATIN (CRESTOR) 10 MG TABLET    Take 10 mg by mouth daily.   SODIUM CHLORIDE HYPERTONIC 3 % NEBULIZER SOLUTION    Take 4 mLs by nebulization 2 (two) times daily as needed for other.  Modified Medications   No medications on file  Discontinued Medications   No medications on file    Subjective: Stacy Sanchez is in for her hospital follow-up visit.  She is accompanied by her daughter.  She had a recent fall leading to the hospitalization.  While hospitalized she was found to have some heart issues and was started on multiple new medications including lisinopril.  It sounds like there was a great deal of difficulty adjusting her medications so she can tolerate them and maintain appropriate blood pressure and pulse.  She developed a bad  cough that improved when lisinopril was stopped.  She was recently started on losartan and her cough has worsened again.  She has been feeling more short of breath.  Her blood pressure and pulse remain quite elevated and she is concerned.  She says that she is not really concerned about having any new infectious complications of her chronic bronchiectasis.  She is also noted a bad taste in her mouth since starting on her new medications.  As a result she is eating less and has lost some weight.  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  She has seen Dr. Brigitte Pulse at [facility] in the past for this.  Her last visit there was in April 2019.  She has a significant history  medication allergies and intolerances including amikacin, ethambutol, rifabutin, linezolid, and quinolones which greatly complicated treatment for Mycobacterium avium.  She was receiving aerosolized gentamicin but that was stopped sometime in the last few years.  Dr. Lorenda Cahill said that he would consider aerosolized amikacin and oral azithromycin if she needed it in the future.  Her most recent AFB cultures were negative last August and in February.   Review of Systems: Review of Systems  Constitutional: Positive for malaise/fatigue and weight loss. Negative for chills, diaphoresis and fever.  Respiratory: Positive for cough, sputum production and shortness of breath. Negative for hemoptysis and wheezing.   Cardiovascular: Negative for chest pain.    Past Medical History:  Diagnosis Date  . Arthritis   . CHF (congestive heart failure) ([place])   . Hypertension   . Mycobacterium abscessus infection   . Mycobacterium avium complex ([place])   . Pseudomonas respiratory infection   . Thyroid disease     Social History   Tobacco Use  . Smoking status: Never Smoker  . Smokeless tobacco: Never Used  Substance Use Topics  . Alcohol use: No  . Drug use: No    Family History  Problem Relation Age of Onset  . Thyroid disease Daughter     Allergies  Allergen Reactions  . Amikacin Other (See Comments)    nephropathy   . Cefepime Other (See Comments)    Tremors Slurred speech  AKI  . Ethambutol Other (See Comments)    neuropathy  . Levaquin [Levofloxacin] Other (See Comments)    Fainting and weakness  . Parafon Forte Dsc [Chlorzoxazone] Hives  . Rifabutin Diarrhea  . Tequin [Gatifloxacin] Other (See Comments)    Fainting and weakness  . Zyvox [Linezolid] Diarrhea    Objective: Vitals:   03/07/20 1506  BP: (!) 155/94  Pulse: (!) 109  SpO2: 98%  Weight: 119 lb (54 kg)   Body mass index is 18.64 kg/m.  Physical Exam Constitutional:      Comments: She is very pleasant.  She is  concerned about her recent heart issues.  Cardiovascular:     Rate and Rhythm: Regular rhythm. Tachycardia present.  Pulmonary:     Effort: Pulmonary effort is normal.         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  He does not sound like she has worsening pulmonary symptoms indicating a recurrence at this time.  Her most recent AFB cultures were negative.  I will obtain repeat cultures now and see her back in 6 weeks.  With her multiple medication intolerances and allergies it would be impossible to craft a curative regimen if she does develop a recurrence.      Relevant Orders   MYCOBACTERIA, CULTURE, WITH FLUOROCHROME SMEAR   Pseudomonas aeruginosa infection   Relevant Orders   Respiratory or Resp and Sputum Culture       Michel Bickers, MD [facility] [place] [facility] [phone] pager   [phone] cell 03/07/2020, 5:07 PM

## 2020-03-07 NOTE — Telephone Encounter (Signed)
Can she get a nurse visit for an ECG this week?

## 2020-03-07 NOTE — Progress Notes (Addendum)
Referring Physician: Glenna Durand, MD HF Cardiologist: Dr. Gala Romney  HPI:  Stacy Sanchez is a 74 y/o woman with chronic hypoxic respiratory failure severe in setting of MAI infection complicated by bronchiectasiswith recurrent Pseudomonal infections. History also notable for severe OSA, patent ductus repair at age 84. She has been referred by Marlin Canary, PA at [facility] for further evaluation of new-onset systolic HF.    She was admitted to [facility] in 10/21 with syncopal spell and diagnosed with new nonischemic cardiomyopathy. Echocardiogram showed EF 20-25%, normal RV, LVIDD 4.6 cm. (Previous echocardiogram 2019 showed EF 50-55%, and either a torn chordae or redundant chordae by report.) Cardiac cath revealed no CAD. EKG showed sinus rhythm with T wave abnormalities with narrow QRS. CT and MRI of head were unremarkable. EP was consulted and ICD was placed. Post procedure she developed a hematoma. She was initially placed on Entresto, spironolactone and Jardiance along with beta-blocker. She developed symptomatic hypotension and her medications had to be changed. Currently on low-dose beta-blocker and ACEI. Not requiring a diuretic.   Recently presented to HF Clinic for follow up on 02/24/20 with Dr. Gala Romney. Came with her daughter who teaches Kindergarten at [employer]. Prior to syncopal episode, was not very active and was SOB with mild activity. Since discharge, she remains fatigued. SOB with minimal activities. No edema, orthopnea or PND. Frequent cough.  BP had been labile in past.   Today she returns to HF clinic for pharmacist medication titration. At last visit with MD, lisinopril 2.5 mg daily was changed to losartan 25 mg daily due to cough. Additionally, digoxin 0.125 mg daily was initiated. A 14 day Zio patch was placed on 03/01/20 due to tachycardia. Overall she is feeling poorly today. Feels worn out. She used to like baking and cooking and now feels like she can't complete those tasks.  Says that something that used to take her 10 minutes now takes 45 minutes because she has to stop frequently to rest. No dizziness, lightheadedness, chest pain or palpitations. Continues to complain of dry cough, nausea and a bitter taste in her mouth. She also feels like she has "brain fog". Gets SOB easily. Is only able to walk up and down the hallway before needing to rest.  Her weights have been stable at home. She has not needed any PRN furosemide. No LEE, PND or orthopnea. Appetite is poor, partly because she has no energy to cook. Her SBPs at home have ranged 130s-150s. She checks her HR at home and it is typically 110-130 during the day. BP in clinic was 154/94 and HR was 82.      HF Medications: Metoprolol succinate 25 mg daily Losartan 25 mg daily Digoxin 0.125 mg daily Furosemide 20 mg daily PRN  Does the patient have any problems obtaining medications due to transportation or finances?   no  Understanding of regimen: good Understanding of indications: good Potential of compliance: good Patient understands to avoid NSAIDs. Patient understands to avoid decongestants.    Pertinent Lab Values: . 02/24/20: Serum creatinine 1.40, BUN 29, Potassium 4.6, Sodium 138, BNP 193.2 . BMET and digoxin level today pending   Vital Signs: . Weight: 118 lbs (last clinic weight: 118.6 lbs) . Blood pressure: 154/94  . Heart rate: 82   Assessment: 1. 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 Echo at [facility] EF 25-30%  - etiology unclear  - cath at [facility] 10/21 no CAD.  - s/p Biotronik ICD in setting of syncopal episode with presumed  VT - NYHA III-IIIB, euvolemic on exam.  - Discussed case with Dr. Gala Romney. Given significant fatigue and ST on most recent EKG, need to rule out low output heart failure.  Will schedule her for RHC this Thursday.  - BMET and digoxin level pending - Continue metoprolol succinate 25 mg daily - Continue losartan 25 mg daily. Unable to tolerate lisinopril  due to cough. - Continue digoxin 0.125 mg daily - Would like to add spironolactone but K has been 4.7-5.0. - Etiology of NICM unclear. Ideally would have liked cMRI to look for infiltrative process to explain cardiomyopathy and possible VT but unable to do this with ICD now present      2. H/o syncope - ICD placed 10/21 at [facility]  3. 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 - followed by Dr. Craige Cotta  4. Severe OSA - followed by Dr. Craige Cotta  5. H/o patent ductus repair    Karle Plumber, PharmD, BCPS, BCCP, CPP Heart Failure Clinic Pharmacist [phone]  Patient seen and examined with the above-signed Advanced Practice Provider and/or Housestaff. I personally reviewed laboratory data, imaging studies and relevant notes. I independently examined the patient and formulated the important aspects of the plan. I have edited the note to reflect any of my changes or salient points. I have personally discussed the plan with the patient and/or family.  74 y/o woman with severe lung disease. Diagnosed with NICM EF 20-25% at [facility] in 10/21. Over past few weeks has had progressive sinus tachycardia with class IIIB symptoms. ECG with sinus tach. Monitor placed which confirmed sinus tach. Rates 60-130. Average 101. Rates slower at night.   General:  Thin frail. No resp difficulty HEENT: normal Neck: supple. no JVD. Carotids 2+ bilat; no bruits. No lymphadenopathy or thryomegaly appreciated. Cor: PMI nondisplaced. Regular rate & rhythm. No rubs, gallops or murmurs. Lungs: decreased throughout  Abdomen: soft, nontender, nondistended. No hepatosplenomegaly. No bruits or masses. Good bowel sounds. Extremities: no cyanosis, clubbing, rash, edema Neuro: alert & orientedx3, cranial nerves grossly intact. moves all 4 extremities w/o difficulty. Affect pleasant  I worry sinus tach could be related to low  cardiac output. No other clear causes though I a concerned that anxiety over recent diagnosis of systolic HF. Will plan RHC to [place]uste. Meds as above.   Total time spent 45 minutes. Over half that time spent discussing above.   Arvilla Meres, MD  10:58 PM

## 2020-03-08 ENCOUNTER — Ambulatory Visit (HOSPITAL_COMMUNITY)
Admission: RE | Admit: 2020-03-08 | Discharge: 2020-03-08 | Disposition: A | Discharge status: Home / Self Care | Payer: Medicare HMO | Source: Ambulatory Visit | Attending: Internal Medicine | Admitting: Internal Medicine

## 2020-03-08 ENCOUNTER — Encounter (HOSPITAL_COMMUNITY): Payer: Self-pay

## 2020-03-08 ENCOUNTER — Ambulatory Visit (INDEPENDENT_AMBULATORY_CARE_PROVIDER_SITE_OTHER): Payer: Medicare HMO | Admitting: Pulmonary Disease

## 2020-03-08 ENCOUNTER — Encounter: Payer: Self-pay | Admitting: Pulmonary Disease

## 2020-03-08 ENCOUNTER — Telehealth (HOSPITAL_COMMUNITY): Payer: Self-pay

## 2020-03-08 VITALS — BP 128/82 | HR 110 | Temp 97.9°F | Ht 67.0 in | Wt 118.8 lb

## 2020-03-08 VITALS — BP 164/100 | HR 104 | Wt 118.6 lb

## 2020-03-08 DIAGNOSIS — R000 Tachycardia, unspecified: Secondary | ICD-10-CM | POA: Diagnosis not present

## 2020-03-08 DIAGNOSIS — G4733 Obstructive sleep apnea (adult) (pediatric): Secondary | ICD-10-CM

## 2020-03-08 DIAGNOSIS — Z0130 Encounter for examination of blood pressure without abnormal findings: Secondary | ICD-10-CM

## 2020-03-08 DIAGNOSIS — I471 Supraventricular tachycardia: Secondary | ICD-10-CM

## 2020-03-08 DIAGNOSIS — Z9989 Dependence on other enabling machines and devices: Secondary | ICD-10-CM | POA: Diagnosis not present

## 2020-03-08 NOTE — Telephone Encounter (Signed)
Patient came in today for a nurse/EKG appointment(EKG in chart). Vitals are listed below:  Blood Pressure:164/104  o2 stat:96(1L of o2)  HR:104 WT:118.6lbs RedsClip:22%  Patient reports she still feels short of breath but not worse than before. Discussed patient with Dinah Beers and she advised that Stacy Sanchez should discontinue using the Zio monitor today and send it back to the company so we can get the results soon. Please advise if further action is needed.

## 2020-03-08 NOTE — Patient Instructions (Signed)
Will get copy of your CPAP report from [employer]  Follow up in 4 months

## 2020-03-08 NOTE — Telephone Encounter (Signed)
ECG shows sinus tach. No change from previous. Fluid level ok.

## 2020-03-08 NOTE — Progress Notes (Signed)
[name]  Chief Complaint  Patient presents with   Follow-up    Cpap settings. Seems to be removing the mask in here sleep.    Constitutional:  BP 128/82 (BP Location: Left Arm, Cuff Size: Normal)    Pulse (!) 110    Temp 97.9 F (36.6 C) (Oral)    Ht 5\' 7"  (1.702 m)    Wt 118 lb 12.8 oz (53.9 kg)    SpO2 97%    BMI 18.61 kg/m   Past Medical History:  Hypothyroidism, HTN, OA, CKD 3a, HLD, Depression, Anxiety, CAD  Past Surgical History:  Her  has a past surgical history that includes Cardiac surgery; Abdominal hysterectomy; Tonsillectomy (1956); Bronchoscopy (2001); Colonoscopy (2008); Cyst removal trunk (1965); Dilation and curettage of uterus (1975); Hand surgery (Right, 1983); Patent ductus arterious repair (1952); Varicose vein surgery (1972); and ICD IMPLANT (Left, 02/01/2020).  Brief Summary:  Stacy Sanchez is a 74 y.o. female with MAI in 2001 complicated by bronchiectasis with recurrent Pseudomonal infections.      Subjective:   She was in hospital in [facility] for non-ischemic CM.  Had defibrillator placed.  Has been using CPAP nightly.  Feels pressure is okay.  Using 1.5 to 2 liters oxygen during the day.  Uses 3 liters oxygen with CPAP at night.    She has been feeling more fatigued recently.  Has noticed her heart rate running in the 100's to 120's.  Her SpO2 stays between 88 to 95%.  She is not having cough, wheeze, or sputum.  No recent fever, or hemoptysis.  No having chest pain.  CT scan from earlier this month reported stable findings from bronchiectasis.  She maintained her SpO2 > 90% with 2 liters using her inogen device during ambulatory oximetry assessment today.  Physical Exam:   Appearance - well kempt, thin  ENMT - no sinus tenderness, no oral exudate, no LAN, Mallampati 3 airway, no stridor  Respiratory - decreased breath sounds bilaterally, no wheezing or rales  CV - s1s2 regular, tachycardic, no  murmurs  Ext - no clubbing, no edema  Skin - no rashes  Psych - normal mood and affect   Chest Imaging:   CT angio chest 11/27/18 >> prominent adenopathy, mosaic attenuation, BTX, central airway thickening, mucus plugging, mild emphysema, density in RUL  CT chest 06/22/19 >> severe b/l cylindrical BTX, peribronchovascular nodularity and peripheral mucoid impaction, scarring and volume lose in lingula, subpleural consolidation in RUL  CT angio chest 03/03/20 >> changes of BTX  Sleep Tests:   PSG 12/03/19 >> AHI 66, SpO2 low 75%.  Limited sleep time.  CPAP titration 01/02/20 >> CPAP 7 cm H2O with 3 liters oxygen >> AHI 0.9, no REM.  Micro data:   Sputum 11/30/18 >> Pseudomonas aeruginosa, pan-S  Cardiac Tests:   Echo 01/25/20 >> EF 20 to 25%  Social History:  She  reports that she has never smoked. She has never used smokeless tobacco. She reports that she does not drink alcohol and does not use drugs.  Family History:  Her family history includes Thyroid disease in her daughter.      Assessment/Plan:   Bronchiectasis with history of MAI and Pseudomonas. - prn duoneb and hypertonic saline - prn mucinex and flutter valve - followed by Dr. 03/26/20 with [facility] for Infectious Disease  Chronic respiratory failure with hypoxia. - secondary to bronchiectasis - 2 liters oxygen during the day - uses 3 liters oxygen  at night with CPAP  Obstructive sleep apnea. - she is compliant with CPAP and reports benefit from therapy - she uses [employer] for her DME - continue CPAP 7 cm H2O - will get copy of her download and then determine if she needs overnight oximetry with CPAP and 3 liters  Tachycardia. Non ischemic cardiomyopathy. - she will follow up with Dr. Prescott Gum office later today  Time Spent Involved in Patient Care on Day of Examination:  47 minutes  Follow up:  Patient Instructions  Will get copy of your CPAP report from [facility]  Follow up  in 4 months   Medication List:   Allergies as of 03/08/2020      Reactions   Amikacin Other (See Comments)   nephropathy    Cefepime Other (See Comments)   Tremors Slurred speech  AKI   Ethambutol Other (See Comments)   neuropathy   Levaquin [levofloxacin] Other (See Comments)   Fainting and weakness   Parafon Forte Dsc [chlorzoxazone] Hives   Rifabutin Diarrhea   Tequin [gatifloxacin] Other (See Comments)   Fainting and weakness   Zyvox [linezolid] Diarrhea      Medication List       Accurate as of March 08, 2020  1:39 PM. If you have any questions, ask your nurse or doctor.        acetaminophen 650 MG CR tablet Commonly known as: TYLENOL Take 1,300 mg by mouth every 8 (eight) hours as needed for pain.   aspirin EC 81 MG tablet Take 81 mg by mouth daily.   buPROPion 150 MG 24 hr tablet Commonly known as: WELLBUTRIN XL Take 150 mg by mouth every morning.   Compressor/Nebulizer Misc 1 Product by Does not apply route once for 1 dose.   digoxin 0.125 MG tablet Commonly known as: LANOXIN Take 1 tablet (0.125 mg total) by mouth daily.   escitalopram 10 MG tablet Commonly known as: LEXAPRO Take 10 mg by mouth daily.   furosemide 20 MG tablet Commonly known as: LASIX Take 1 tablet (20 mg total) by mouth daily as needed.   gabapentin 300 MG capsule Commonly known as: NEURONTIN Take 300 mg by mouth 2 (two) times daily.   ipratropium-albuterol 0.5-2.5 (3) MG/3ML Soln Commonly known as: DUONEB Take 3 mLs by nebulization 3 (three) times daily as needed.   levothyroxine 50 MCG tablet Commonly known as: SYNTHROID Take 50-100 mcg by mouth See admin instructions. Take 1 tablet daily on Monday through Saturday and take 2 tablets on Saturday   losartan 25 MG tablet Commonly known as: COZAAR Take 1 tablet (25 mg total) by mouth at bedtime.   metoprolol succinate 25 MG 24 hr tablet Commonly known as: Toprol XL Take 1 tablet (25 mg total) by mouth daily.    multivitamin capsule Take 1 capsule by mouth daily.   multivitamin with minerals Tabs tablet Take 1 tablet by mouth daily.   OXYGEN Inhale into the lungs. As needed. 2l during day.   polyethylene glycol powder 17 GM/SCOOP powder Commonly known as: GLYCOLAX/MIRALAX Take 17 g by mouth daily as needed for moderate constipation.   rosuvastatin 10 MG tablet Commonly known as: CRESTOR Take 10 mg by mouth daily.   senna 8.6 MG tablet Commonly known as: SENOKOT Take by mouth.   sodium chloride HYPERTONIC 3 % nebulizer solution Take 4 mLs by nebulization 2 (two) times daily as needed for other.       Signature:  Coralyn Helling, MD [facility] Pulmonary/Critical Care Pager - [phone] 03/08/2020, 1:39 PM

## 2020-03-08 NOTE — Telephone Encounter (Signed)
Left VM for daughter Lawson Fiscal and pt to return call to schedule nurse visit and ekg.

## 2020-03-10 ENCOUNTER — Telehealth: Payer: Self-pay | Admitting: Pulmonary Disease

## 2020-03-10 NOTE — Telephone Encounter (Signed)
Called and left message on voicemail to please return phone call. Contact number provided. 

## 2020-03-10 NOTE — Telephone Encounter (Signed)
Patient returned phone call and writer went over CPAP download result per Dr Craige Cotta with patient. All questions answered and patient expressed full understanding to continue using CPAP at night with 3 Liters of oxygen. Nothing further needed at this time.

## 2020-03-10 NOTE — Telephone Encounter (Signed)
CPAP 02/27/20 to 03/06/20 >> used on 8 of 9 nights with average 5 hrs 1 min.  Average AHI 5.5 with CPAP 7 cm H2O.   Please let her know her CPAP download looks good.  She should continue using CPAP at night with 3 liters oxygen.  She doesn't need an overnight oxygen test at this time.

## 2020-03-14 ENCOUNTER — Telehealth (HOSPITAL_COMMUNITY): Payer: Self-pay | Admitting: *Deleted

## 2020-03-14 NOTE — Telephone Encounter (Signed)
Melissa w/[facility] called to report pts heart rate is still 120s-130s. Melissa also added pt is very tired with poor endurance. Pt c/o not sleeping well at night.  Melissa asked that we call the patient directly. I called the patient no answer left VM requesting return.

## 2020-03-15 ENCOUNTER — Telehealth (HOSPITAL_COMMUNITY): Payer: Self-pay | Admitting: Vascular Surgery

## 2020-03-15 NOTE — Telephone Encounter (Signed)
2nd attempt to reach pt by phone. Called the only number she has listed in her chart and left 2nd voice message. [phone] (H)  [phone] (M)

## 2020-03-15 NOTE — Telephone Encounter (Signed)
Left detailed VM.  

## 2020-03-15 NOTE — Telephone Encounter (Signed)
[facility] called again today about pts heart rate and fatigue. Patient has an office visit with the pharmacy clinic 11/29. Spoke with pt her heart rate is still 120s-130s at rest bp 140/90 c/o shortness of breath and fatigue. Denies swelling, chest pain, and cough.  Patient said her heart rate was in the 80's normally before adding digoxin and stopping lisinopril and switching to losartan on 11/4. Pt would also like the results from her zio patch.  Routed to Dr.Bensimhon for advice

## 2020-03-15 NOTE — Telephone Encounter (Signed)
Please call pt about weakness and heart rate, nurse from [facility] called stating pt called with no call back, I saw note in chart from you calling pt back .. please advise

## 2020-03-15 NOTE — Telephone Encounter (Signed)
Her ECG was totally normal when she was here. Zio results not back yet.

## 2020-03-20 ENCOUNTER — Ambulatory Visit (HOSPITAL_COMMUNITY)
Admission: RE | Admit: 2020-03-20 | Discharge: 2020-03-20 | Disposition: A | Discharge status: Home / Self Care | Payer: Medicare HMO | Source: Ambulatory Visit | Attending: Internal Medicine | Admitting: Internal Medicine

## 2020-03-20 ENCOUNTER — Other Ambulatory Visit: Payer: Self-pay

## 2020-03-20 ENCOUNTER — Other Ambulatory Visit (HOSPITAL_COMMUNITY): Payer: Self-pay | Admitting: *Deleted

## 2020-03-20 VITALS — BP 154/94 | HR 82 | Wt 118.0 lb

## 2020-03-20 DIAGNOSIS — Z95810 Presence of automatic (implantable) cardiac defibrillator: Secondary | ICD-10-CM | POA: Insufficient documentation

## 2020-03-20 DIAGNOSIS — G4733 Obstructive sleep apnea (adult) (pediatric): Secondary | ICD-10-CM | POA: Insufficient documentation

## 2020-03-20 DIAGNOSIS — J9611 Chronic respiratory failure with hypoxia: Secondary | ICD-10-CM | POA: Insufficient documentation

## 2020-03-20 DIAGNOSIS — I5022 Chronic systolic (congestive) heart failure: Secondary | ICD-10-CM | POA: Insufficient documentation

## 2020-03-20 DIAGNOSIS — R000 Tachycardia, unspecified: Secondary | ICD-10-CM

## 2020-03-20 DIAGNOSIS — I428 Other cardiomyopathies: Secondary | ICD-10-CM | POA: Insufficient documentation

## 2020-03-20 DIAGNOSIS — I2510 Atherosclerotic heart disease of native coronary artery without angina pectoris: Secondary | ICD-10-CM

## 2020-03-20 LAB — BASIC METABOLIC PANEL
Anion gap: 10 (ref 5–15)
BUN: 28 mg/dL — ABNORMAL HIGH (ref 8–23)
CO2: 30 mmol/L (ref 22–32)
Calcium: 9.4 mg/dL (ref 8.9–10.3)
Chloride: 99 mmol/L (ref 98–111)
Creatinine, Ser: 1.8 mg/dL — ABNORMAL HIGH (ref 0.44–1.00)
GFR, Estimated: 29 mL/min — ABNORMAL LOW (ref >60)
Glucose, Bld: 78 mg/dL (ref 70–99)
Potassium: 5.1 mmol/L (ref 3.5–5.1)
Sodium: 139 mmol/L (ref 135–145)

## 2020-03-20 LAB — DIGOXIN LEVEL: Digoxin Level: 1.8 ng/mL (ref 1.0–2.0)

## 2020-03-20 NOTE — Patient Instructions (Addendum)
It was a pleasure seeing you today!  MEDICATIONS: -No medication changes today - Plan for a right heart catheterization Thursday of this week.  -Call if you have questions about your medications.  LABS: -We will call you if your labs need attention.  NEXT APPOINTMENT: Return to clinic Thursday for a right heart catheterization  In general, to take care of your heart failure: -Limit your fluid intake to 2 Liters (half-gallon) per day.   -Limit your salt intake to ideally 2-3 grams (2000-3000 mg) per day. -Weigh yourself daily and record, and bring that "weight diary" to your next appointment.  (Weight gain of 2-3 pounds in 1 day typically means fluid weight.) -The medications for your heart are to help your heart and help you live longer.   -Please contact us before stopping any of your heart medications.  Call the clinic at [phone] with questions or to reschedule future appointments.    Catheterization Instructions:  You are scheduled for a Cardiac Catheterization on Thursday, December 12 with Dr. Arvilla Meres.  1. Please arrive at the [facility] (Main Entrance A) at [facility]: [address] [place], [place] [address] at 5:30 AM . Free valet parking service is available.   Special note: Every effort is made to have your procedure done on time. Please understand that emergencies sometimes delay scheduled procedures.  2. Diet: Do not eat solid foods after midnight.  The patient may have clear liquids until 5am upon the day of the procedure.  3. COVID TEST: Tuesday 03/21/20 at 10:20 AM, this is a drive thru service located at: [address] [employer], [place]  4. Medication instructions in preparation for your procedure:   Thur 12/2 AM DO NOT TAKE FUROSEMIDE  On the morning of your procedure, take any morning medicines NOT listed above.  You may use sips of water.  5. Plan for one night stay--bring personal belongings. 6. Bring a current list of your  medications and current insurance cards. 7. You MUST have a responsible person to drive you home. 8. Someone MUST be with you the first 24 hours after you arrive home or your discharge will be delayed. 9. Please wear clothes that are easy to get on and off and wear slip-on shoes.  Thank you for allowing [place] to care for you!   -- [facility]

## 2020-03-21 ENCOUNTER — Encounter (HOSPITAL_COMMUNITY): Payer: Self-pay

## 2020-03-21 ENCOUNTER — Telehealth (HOSPITAL_COMMUNITY): Payer: Self-pay | Admitting: Pharmacist

## 2020-03-21 ENCOUNTER — Other Ambulatory Visit (HOSPITAL_COMMUNITY): Payer: Medicare HMO

## 2020-03-21 MED ORDER — DIGOXIN 125 MCG PO TABS
0.0625 mg | ORAL_TABLET | Freq: Every day | ORAL | 6 refills | Status: DC
Start: 2020-03-21 — End: 2020-05-01

## 2020-03-21 NOTE — Telephone Encounter (Signed)
Digoxin level elevated (7 hour level). Instructed patient to HOLD digoxin for 2 days, then decrease dose to 0.0625 mg daily. Repeat labs in 10-14 days. Instructed patient not to take digoxin before her labs are drawn 04/03/20. Patient expressed understanding.

## 2020-03-22 ENCOUNTER — Other Ambulatory Visit (HOSPITAL_COMMUNITY): Payer: Self-pay | Admitting: Internal Medicine

## 2020-03-22 ENCOUNTER — Telehealth (HOSPITAL_COMMUNITY): Payer: Self-pay | Admitting: *Deleted

## 2020-03-22 NOTE — Telephone Encounter (Signed)
Pt had a Rapid Covid test done 03/21/20 at [facility] [employer], test was negative, results scanned into epic

## 2020-03-23 ENCOUNTER — Encounter (HOSPITAL_COMMUNITY)
Admission: RE | Disposition: A | Discharge status: Home / Self Care | Payer: Self-pay | Source: Home / Self Care | Attending: Internal Medicine

## 2020-03-23 ENCOUNTER — Encounter (HOSPITAL_COMMUNITY): Payer: Self-pay | Admitting: Internal Medicine

## 2020-03-23 ENCOUNTER — Other Ambulatory Visit: Payer: Self-pay

## 2020-03-23 ENCOUNTER — Ambulatory Visit (HOSPITAL_COMMUNITY)
Admission: RE | Admit: 2020-03-23 | Discharge: 2020-03-23 | Disposition: A | Discharge status: Home / Self Care | Payer: Medicare HMO | Attending: Internal Medicine | Admitting: Internal Medicine

## 2020-03-23 DIAGNOSIS — Z20822 Contact with and (suspected) exposure to covid-19: Secondary | ICD-10-CM | POA: Insufficient documentation

## 2020-03-23 DIAGNOSIS — I428 Other cardiomyopathies: Secondary | ICD-10-CM | POA: Diagnosis not present

## 2020-03-23 DIAGNOSIS — J9611 Chronic respiratory failure with hypoxia: Secondary | ICD-10-CM | POA: Insufficient documentation

## 2020-03-23 DIAGNOSIS — Z95810 Presence of automatic (implantable) cardiac defibrillator: Secondary | ICD-10-CM | POA: Insufficient documentation

## 2020-03-23 DIAGNOSIS — Z79899 Other long term (current) drug therapy: Secondary | ICD-10-CM | POA: Insufficient documentation

## 2020-03-23 DIAGNOSIS — I110 Hypertensive heart disease with heart failure: Secondary | ICD-10-CM | POA: Diagnosis present

## 2020-03-23 DIAGNOSIS — Z881 Allergy status to other antibiotic agents status: Secondary | ICD-10-CM | POA: Insufficient documentation

## 2020-03-23 DIAGNOSIS — Z7982 Long term (current) use of aspirin: Secondary | ICD-10-CM | POA: Insufficient documentation

## 2020-03-23 DIAGNOSIS — I5022 Chronic systolic (congestive) heart failure: Secondary | ICD-10-CM | POA: Insufficient documentation

## 2020-03-23 DIAGNOSIS — G4733 Obstructive sleep apnea (adult) (pediatric): Secondary | ICD-10-CM | POA: Insufficient documentation

## 2020-03-23 HISTORY — PX: RIGHT HEART CATH: CATH118263

## 2020-03-23 LAB — POCT I-STAT EG7
Acid-Base Excess: 3 mmol/L — ABNORMAL HIGH (ref 0.0–2.0)
Acid-Base Excess: 4 mmol/L — ABNORMAL HIGH (ref 0.0–2.0)
Bicarbonate: 28.5 mmol/L — ABNORMAL HIGH (ref 20.0–28.0)
Bicarbonate: 29.2 mmol/L — ABNORMAL HIGH (ref 20.0–28.0)
Calcium, Ion: 1.07 mmol/L — ABNORMAL LOW (ref 1.15–1.40)
Calcium, Ion: 1.14 mmol/L — ABNORMAL LOW (ref 1.15–1.40)
HCT: 33 % — ABNORMAL LOW (ref 36.0–46.0)
HCT: 34 % — ABNORMAL LOW (ref 36.0–46.0)
Hemoglobin: 11.2 g/dL — ABNORMAL LOW (ref 12.0–15.0)
Hemoglobin: 11.6 g/dL — ABNORMAL LOW (ref 12.0–15.0)
O2 Saturation: 60 %
O2 Saturation: 61 %
Potassium: 4.3 mmol/L (ref 3.5–5.1)
Potassium: 4.6 mmol/L (ref 3.5–5.1)
Sodium: 142 mmol/L (ref 135–145)
Sodium: 141 mmol/L (ref 135–145)
TCO2: 30 mmol/L (ref 22–32)
TCO2: 31 mmol/L (ref 22–32)
pCO2, Ven: 47.1 mmHg (ref 44.0–60.0)
pCO2, Ven: 48 mmHg (ref 44.0–60.0)
pH, Ven: 7.39 (ref 7.250–7.430)
pH, Ven: 7.392 (ref 7.250–7.430)
pO2, Ven: 32 mmHg (ref 32.0–45.0)
pO2, Ven: 33 mmHg (ref 32.0–45.0)

## 2020-03-23 LAB — SARS CORONAVIRUS 2 BY RT PCR (HOSPITAL ORDER, PERFORMED IN ~~LOC~~ HOSPITAL LAB): SARS Coronavirus 2: NEGATIVE

## 2020-03-23 SURGERY — RIGHT HEART CATH
Anesthesia: LOCAL

## 2020-03-23 MED ORDER — SODIUM CHLORIDE 0.9 % IV SOLN: 250.0000 mL | INTRAVENOUS | Status: DC | PRN

## 2020-03-23 MED ORDER — ACETAMINOPHEN 325 MG PO TABS: 650.0000 mg | ORAL_TABLET | ORAL | Status: DC | PRN

## 2020-03-23 MED ORDER — HEPARIN (PORCINE) IN NACL 1000-0.9 UT/500ML-% IV SOLN
INTRAVENOUS | Status: DC | PRN
  Administered 2020-03-23: 500 mL

## 2020-03-23 MED ORDER — HYDRALAZINE HCL 20 MG/ML IJ SOLN: 10.0000 mg | INTRAMUSCULAR | Status: DC | PRN

## 2020-03-23 MED ORDER — HEPARIN (PORCINE) IN NACL 1000-0.9 UT/500ML-% IV SOLN
INTRAVENOUS | Status: AC
  Filled 2020-03-23: qty 500

## 2020-03-23 MED ORDER — ASPIRIN 81 MG PO CHEW
81.0000 mg | CHEWABLE_TABLET | ORAL | Status: AC
  Administered 2020-03-23: 81 mg via ORAL
  Filled 2020-03-23: qty 1

## 2020-03-23 MED ORDER — SODIUM CHLORIDE 0.9% FLUSH: 3.0000 mL | INTRAVENOUS | Status: DC | PRN

## 2020-03-23 MED ORDER — SODIUM CHLORIDE 0.9% FLUSH: 3.0000 mL | Freq: Two times a day (BID) | INTRAVENOUS | Status: DC

## 2020-03-23 MED ORDER — LIDOCAINE HCL (PF) 1 % IJ SOLN
INTRAMUSCULAR | Status: DC | PRN
  Administered 2020-03-23: 2 mL

## 2020-03-23 MED ORDER — LIDOCAINE HCL (PF) 1 % IJ SOLN
INTRAMUSCULAR | Status: AC
  Filled 2020-03-23: qty 30

## 2020-03-23 MED ORDER — ONDANSETRON HCL 4 MG/2ML IJ SOLN: 4.0000 mg | Freq: Four times a day (QID) | INTRAMUSCULAR | Status: DC | PRN

## 2020-03-23 MED ORDER — SODIUM CHLORIDE 0.9 % IV SOLN: INTRAVENOUS | Status: DC

## 2020-03-23 MED ORDER — LABETALOL HCL 5 MG/ML IV SOLN: 10.0000 mg | INTRAVENOUS | Status: DC | PRN

## 2020-03-23 SURGICAL SUPPLY — 6 items
CATH SWAN GANZ 7F STRAIGHT (CATHETERS) ×2 IMPLANT
GLIDESHEATH SLENDER 7FR .021G (SHEATH) ×2 IMPLANT
PACK CARDIAC CATHETERIZATION (CUSTOM PROCEDURE TRAY) ×2 IMPLANT
PROTECTION STATION PRESSURIZED (MISCELLANEOUS) ×2
STATION PROTECTION PRESSURIZED (MISCELLANEOUS) ×1 IMPLANT
TRANSDUCER W/STOPCOCK (MISCELLANEOUS) ×2 IMPLANT

## 2020-03-23 NOTE — Discharge Instructions (Signed)
Leave dressing in place for 3-4 hours.  Check site for any signs of infection over next week (redness, drainage, swelling, warmth, fever). Call Dr. Prescott Gum office with any questions.

## 2020-03-23 NOTE — Addendum Note (Signed)
Encounter addended by: Crissie Figures, RN on: 03/23/2020 9:30 AM  Actions taken: Imaging Exam ended

## 2020-03-23 NOTE — Progress Notes (Signed)
Discharge instructions reviewed with patient and family. Verbalized understanding. 

## 2020-03-23 NOTE — Interval H&P Note (Signed)
History and Physical Interval Note:  03/23/2020 8:19 AM  Stacy Sanchez  has presented today for surgery, with the diagnosis of heart failure.  The various methods of treatment have been discussed with the patient and family. After consideration of risks, benefits and other options for treatment, the patient has consented to  Procedure(s): RIGHT HEART CATH (N/A) as a surgical intervention.  The patient's history has been reviewed, patient examined, no change in status, stable for surgery.  I have reviewed the patient's chart and labs.  Questions were answered to the patient's satisfaction.     Seferina Brokaw

## 2020-03-25 NOTE — Addendum Note (Signed)
Encounter addended by: Dolores Patty, MD on: 03/25/2020 10:59 PM  Actions taken: Level of Service modified, Visit diagnoses modified

## 2020-03-25 NOTE — Addendum Note (Signed)
Encounter addended by: Dolores Patty, MD on: 03/25/2020 10:58 PM  Actions taken: Clinical Note Signed

## 2020-03-28 ENCOUNTER — Telehealth (HOSPITAL_COMMUNITY): Payer: Self-pay | Admitting: *Deleted

## 2020-03-28 NOTE — Telephone Encounter (Signed)
Pt has to have dental work. Has to have a crown fixed. Pt wants to know if she needs to hold any meds or if there any special instructions.   Routed to Dr.Bensimhon

## 2020-03-29 NOTE — Telephone Encounter (Signed)
No special instructions.

## 2020-03-30 NOTE — Telephone Encounter (Signed)
No answer/left vm requesting return call.

## 2020-03-31 ENCOUNTER — Other Ambulatory Visit (HOSPITAL_COMMUNITY): Payer: Self-pay | Admitting: *Deleted

## 2020-03-31 ENCOUNTER — Telehealth: Payer: Self-pay

## 2020-03-31 DIAGNOSIS — I5022 Chronic systolic (congestive) heart failure: Secondary | ICD-10-CM

## 2020-03-31 NOTE — Telephone Encounter (Signed)
Patient called office today to inform MD she is not able to provide a sputum sample. Is feeling well and has no complaints. Would like to know if she needs to keep upcoming appointment or follow up as needed. Will forward message to provider. Lorenso Courier, [place]

## 2020-04-03 ENCOUNTER — Ambulatory Visit (HOSPITAL_COMMUNITY)
Admission: RE | Admit: 2020-04-03 | Discharge: 2020-04-03 | Disposition: A | Discharge status: Home / Self Care | Payer: Medicare HMO | Source: Ambulatory Visit | Attending: Internal Medicine | Admitting: Internal Medicine

## 2020-04-03 ENCOUNTER — Other Ambulatory Visit: Payer: Self-pay

## 2020-04-03 DIAGNOSIS — I5022 Chronic systolic (congestive) heart failure: Secondary | ICD-10-CM | POA: Diagnosis not present

## 2020-04-03 LAB — DIGOXIN LEVEL: Digoxin Level: 1.1 ng/mL (ref 0.8–2.0)

## 2020-04-04 NOTE — Telephone Encounter (Signed)
She may follow-up as needed.

## 2020-04-11 ENCOUNTER — Ambulatory Visit: Payer: Medicare HMO | Admitting: Internal Medicine

## 2020-04-20 ENCOUNTER — Telehealth (HOSPITAL_COMMUNITY): Payer: Self-pay | Admitting: *Deleted

## 2020-04-20 NOTE — Telephone Encounter (Signed)
That's fine to stop losartan. Check BP at home and bring readings to appointment with Dr. Gala Romney next week.

## 2020-04-20 NOTE — Telephone Encounter (Signed)
Pt aware and agreeable with plan.  

## 2020-04-20 NOTE — Addendum Note (Signed)
Addended by: Modesta Messing on: 04/20/2020 01:13 PM   Modules accepted: Orders

## 2020-04-20 NOTE — Telephone Encounter (Signed)
Pt left VM stating she is stopping losartan she has experienced a dry cough that keeping her awake at night since starting losartan. Pt asked if there was another medication she needed to take to replace it.  Prince Rome, FNP

## 2020-04-30 NOTE — Progress Notes (Signed)
ADVANCED HF CLINIC NOTE  PCP: Glenna Durand, MD HF Cardiologist: Dr. 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 History also notable for severe OSA, patent ductus repair at age 75. She has been referred by Marlin Canary, PA at [facility] for further evaluation of new-onset systolic HF.    She was admitted to [facility] in 10/21 with syncopal spell and diagnosed with new nonischemic cardiomyopathy. Echocardiogram showed EF 20-25%, normal RV, LVIDD 4.6 cm. (Previous echocardiogram 2019 showed EF 50-55%, and either a torn chordae or redundant chordae by report.) Cardiac cath revealed no CAD. EKG showed sinus rhythm with T wave abnormalities with narrow QRS. CT and MRI of head were unremarkable. EP was consulted and ICD was placed.   Recently struggling with prominent sinus tachycardia and progressive HF symptoms.   Underwent RHC on 03/23/20 which showed Well compensated hemodynamics with very mild PAH  Zio patch 12/21 1. Sinus rhythm - avg HR of 101 bpm. +NSVT Rare PACs and PVCs  Here for routine f/u with her daughter. Says she remains very tired and SOB with any activity. No edema, orthopnea or PND. Just finished HHPT. SBP at home 130-140 HR 70s-110. Felt better after cutting digoxin in half. Wears CPAP and often takes mask off in her sleep.   Echo today 05/01/20 EF 45-50% RV ok. Personally reviewed    RHC 12/21 RA = 2 RV = 33/2 PA = 35/10 (22) PCW = 6 Fick cardiac output/index = 4.1/2.6 PVR = 3.8 WU Ao sat = 94% PA sat = 61%, 60%    Current Outpatient Medications on File Prior to Encounter  Medication Sig Dispense Refill  . acetaminophen (TYLENOL) 650 MG CR tablet Take 650 mg by mouth every 8 (eight) hours as needed for pain.     Marland Kitchen aspirin EC 81 MG tablet Take 81 mg by mouth daily.    Marland Kitchen buPROPion (WELLBUTRIN XL) 300 MG 24 hr tablet Take  300 mg by mouth daily.     . cetirizine (ZYRTEC) 10 MG tablet Take 10 mg by mouth daily as needed for allergies.    Marland Kitchen digoxin (LANOXIN) 0.125 MG tablet Take 0.5 tablets (0.0625 mg total) by mouth daily. 15 tablet 6  . furosemide (LASIX) 20 MG tablet Take 1 tablet (20 mg total) by mouth daily as needed. 30 tablet 6  . gabapentin (NEURONTIN) 300 MG capsule Take 300 mg by mouth 2 (two) times daily.    Marland Kitchen ipratropium-albuterol (DUONEB) 0.5-2.5 (3) MG/3ML SOLN Take 3 mLs by nebulization 3 (three) times daily as needed. 810 mL 1  . levothyroxine (SYNTHROID, LEVOTHROID) 50 MCG tablet Take 50-100 mcg by mouth See admin instructions. Take 50 mg  tablet daily on Monday through Friday  Take 100 mcg  Saturday and Sunday    . metoprolol succinate (TOPROL XL) 25 MG 24 hr tablet Take 1 tablet (25 mg total) by mouth daily. 30 tablet 3  . Multiple Vitamin (MULTIVITAMIN WITH MINERALS) TABS tablet Take 1 tablet by mouth daily.    . Nebulizers (COMPRESSOR/NEBULIZER) MISC 1 Product by Does not apply route once for 1 dose. 1 each 0  . OXYGEN Inhale 2-3 L into the lungs See admin instructions. As needed and at bedtime use 3 liters with CPAP    . polyethylene glycol powder (GLYCOLAX/MIRALAX) 17 GM/SCOOP powder Take 17 g by mouth daily as needed for moderate constipation.     Marland Kitchen  rosuvastatin (CRESTOR) 10 MG tablet Take 10 mg by mouth daily.    Marland Kitchen senna (SENOKOT) 8.6 MG tablet Take 1 tablet by mouth daily as needed for constipation.     . sodium chloride HYPERTONIC 3 % nebulizer solution Take 4 mLs by nebulization 2 (two) times daily as needed for other. 720 mL 2  . zolpidem (AMBIEN) 5 MG tablet Take 5 mg by mouth at bedtime as needed for sleep.     No current facility-administered medications on file prior to encounter.    Today's Vitals   05/01/20 1507  BP: (!) 160/90  Pulse: 86  SpO2: 94%  Weight: 52.6 kg (116 lb)   Body mass index is 18.17 kg/m.  General:  Weak appearing. No resp difficulty HEENT:  normal Neck: supple. no JVD. Carotids 2+ bilat; no bruits. No lymphadenopathy or thryomegaly appreciated. Cor: PMI nondisplaced. Regular rate & rhythm. No rubs, gallops or murmurs. Lungs: decreased BS mild RLL crackled Abdomen: soft, nontender, nondistended. No hepatosplenomegaly. No bruits or masses. Good bowel sounds. Extremities: no cyanosis, clubbing, rash, edema Neuro: alert & orientedx3, cranial nerves grossly intact. moves all 4 extremities w/o difficulty. Affect pleasant  Assessment: 1. systolic HF due to NICM - onset 10/21. Echo at [facility] EF 20-25%  - etiology unclear  - cath at [facility] 10/21 no CAD.  - s/p Biotronik ICD in setting of syncopal episode with presumed VT - RHC 12/21 - well compensated - Echo today 05/01/20 EF 45-50% RV ok. Personally reviewed - Remains NYHA IIIB despite almost complete recovery of LV function - doubt HF main driver of symptoms - volume status ok  - Continue metoprolol succinate 25 mg daily - Unable to tolerate losartan or lisinopril due to cough. - Stop digoxin with recovery of LV function  - Would like to add spironolactone but K has been 4.7-5.0.   2. H/o syncope - ICD placed 10/21 at [facility]  3. 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 - followed by Dr. Craige Cotta - ideally would refer to Pulmonary Rehab but avoiding with COVID risk  4. Severe OSA - Only interrmittent compliance with CPAP.  - We reviewed her downloads together and AHI often still in 15-20 range and does not wear many nights - I have asked her to f/u with Dr. Craige Cotta to discuss options.  5. H/o patent ductus repair  Total time spent 45 minutes. Over half that time spent discussing above.   Stacy Meres, MD  3:35 PM

## 2020-05-01 ENCOUNTER — Other Ambulatory Visit: Payer: Medicare HMO

## 2020-05-01 ENCOUNTER — Other Ambulatory Visit: Payer: Self-pay

## 2020-05-01 ENCOUNTER — Ambulatory Visit (HOSPITAL_COMMUNITY)
Admission: RE | Admit: 2020-05-01 | Discharge: 2020-05-01 | Disposition: A | Discharge status: Home / Self Care | Payer: Medicare HMO | Source: Ambulatory Visit | Attending: Internal Medicine | Admitting: Internal Medicine

## 2020-05-01 ENCOUNTER — Ambulatory Visit (INDEPENDENT_AMBULATORY_CARE_PROVIDER_SITE_OTHER)
Admission: RE | Admit: 2020-05-01 | Discharge: 2020-05-01 | Disposition: A | Discharge status: Home / Self Care | Payer: Medicare HMO | Source: Ambulatory Visit | Attending: Internal Medicine | Admitting: Internal Medicine

## 2020-05-01 ENCOUNTER — Encounter (HOSPITAL_COMMUNITY): Payer: Self-pay

## 2020-05-01 ENCOUNTER — Encounter (HOSPITAL_COMMUNITY): Payer: Self-pay | Admitting: Internal Medicine

## 2020-05-01 VITALS — BP 160/90 | HR 86 | Wt 116.0 lb

## 2020-05-01 DIAGNOSIS — Z7982 Long term (current) use of aspirin: Secondary | ICD-10-CM | POA: Insufficient documentation

## 2020-05-01 DIAGNOSIS — I351 Nonrheumatic aortic (valve) insufficiency: Secondary | ICD-10-CM | POA: Diagnosis not present

## 2020-05-01 DIAGNOSIS — Z79899 Other long term (current) drug therapy: Secondary | ICD-10-CM | POA: Diagnosis not present

## 2020-05-01 DIAGNOSIS — I5022 Chronic systolic (congestive) heart failure: Secondary | ICD-10-CM

## 2020-05-01 DIAGNOSIS — Z95810 Presence of automatic (implantable) cardiac defibrillator: Secondary | ICD-10-CM | POA: Diagnosis not present

## 2020-05-01 DIAGNOSIS — G4733 Obstructive sleep apnea (adult) (pediatric): Secondary | ICD-10-CM | POA: Insufficient documentation

## 2020-05-01 DIAGNOSIS — A310 Pulmonary mycobacterial infection: Secondary | ICD-10-CM

## 2020-05-01 DIAGNOSIS — I110 Hypertensive heart disease with heart failure: Secondary | ICD-10-CM | POA: Diagnosis not present

## 2020-05-01 DIAGNOSIS — J9611 Chronic respiratory failure with hypoxia: Secondary | ICD-10-CM | POA: Insufficient documentation

## 2020-05-01 DIAGNOSIS — I428 Other cardiomyopathies: Secondary | ICD-10-CM | POA: Diagnosis not present

## 2020-05-01 DIAGNOSIS — R55 Syncope and collapse: Secondary | ICD-10-CM

## 2020-05-01 DIAGNOSIS — A498 Other bacterial infections of unspecified site: Secondary | ICD-10-CM

## 2020-05-01 LAB — ECHOCARDIOGRAM COMPLETE
Area-P 1/2: 2.48 cm2
Calc EF: 44.5 %
P 1/2 time: 330 msec
S' Lateral: 3.1 cm
Single Plane A2C EF: 45.5 %
Single Plane A4C EF: 46.2 %

## 2020-05-01 MED ORDER — METOPROLOL SUCCINATE ER 25 MG PO TB24
25.0000 mg | ORAL_TABLET | Freq: Every day | ORAL | 3 refills | Status: DC
Start: 2020-05-01 — End: 2020-08-30

## 2020-05-01 NOTE — Patient Instructions (Signed)
Stop Digoxin  A refill for Metoprolol has been sent to [facility]  Please call Dr Evlyn Courier office to schedule a follow up appointment  Your physician recommends that you schedule a follow-up appointment in: 4 months  If you have any questions or concerns before your next appointment please send [place] a message through [place] or call our office at [phone].    TO LEAVE A MESSAGE FOR THE NURSE SELECT OPTION 2, PLEASE LEAVE A MESSAGE INCLUDING: . YOUR NAME . DATE OF BIRTH . CALL BACK NUMBER . REASON FOR CALL**this is important as we prioritize the call backs  YOU WILL RECEIVE A CALL BACK THE SAME DAY AS LONG AS YOU CALL BEFORE 4:00 PM  At the [facility], you and your health needs are our priority. As part of our continuing mission to provide you with exceptional heart care, we have created designated Provider Care Teams. These Care Teams include your primary Cardiologist (physician) and Advanced Practice Providers (APPs- Physician Assistants and Nurse Practitioners) who all work together to provide you with the care you need, when you need it.   You may see any of the following providers on your designated Care Team at your next follow up: Marland Kitchen Dr Arvilla Meres . Dr Marca Ancona . Tonye Becket, NP . Robbie Lis, PA . Karle Plumber, PharmD   Please be sure to bring in all your medications bottles to every appointment.

## 2020-05-01 NOTE — Progress Notes (Signed)
  Echocardiogram 2D Echocardiogram has been performed.  Stacy Sanchez 05/01/2020, 2:57 PM

## 2020-05-03 DIAGNOSIS — Z9989 Dependence on other enabling machines and devices: Secondary | ICD-10-CM

## 2020-05-03 DIAGNOSIS — G4733 Obstructive sleep apnea (adult) (pediatric): Secondary | ICD-10-CM

## 2020-05-03 NOTE — Telephone Encounter (Signed)
Patient sent email   Hi Dr. Craige Cotta,  I recently had an appointment with Dr. Gala Romney. Great news - there has been a big improvement in my heart function. However, I am really not feeling any better - still exhausted constantly. Dr. Gala Romney looked at my numbers from my sleep study test and on the app on my phone and was quite concerned at how many episodes I am still having each night, never reaching REM sleep.   I am wondering if you have any thoughts or ideas on how to improve my sleep?  Thank you,  Stacy Sanchez   Dr. Craige Cotta please advise.

## 2020-05-04 ENCOUNTER — Telehealth: Payer: Self-pay

## 2020-05-04 NOTE — Telephone Encounter (Signed)
RN spoke with patient, she reports that she has had a cough since about Christmas time with exhaustion and fatigue. RN offered follow-up appointment with Dr. Orvan Falconer, patient scheduled for 05/09/20.  Sandie Ano, RN

## 2020-05-04 NOTE — Telephone Encounter (Signed)
Dr. Craige Cotta patient asking about previous titration study  Just wondering if it would be possible to assess my condition and care by using the past titration test that I had not too long ago. I have already had two sleep studies in the past year and they are kind of expensive. However if this is what really needs to be done again, I will probably say yes. I just hoped we could try something else without doing the study.  Thank you,  Stacy Sanchez     Please advise

## 2020-05-04 NOTE — Telephone Encounter (Signed)
CPAP 04/04/20 to 05/03/20 >> used on 11 of 30 nights with average 3 hrs 53 min.  Average AHI 16.9 with CPAP 7 cm H2O.   Please let her know that her sleep apnea is not adequately controlled with current set up.  She needs to have in lab CPAP titration study to further assess.  If she is agreeable to arrange for titration study, then please order CPAP titration study and put instructions to start study on 7 cm H2O, and she will also need to be titrated for supplemental oxygen at night with CPAP - she currently uses 3 liters oxygen with CPAP.

## 2020-05-04 NOTE — Telephone Encounter (Signed)
I have printed out a download and have given it to Dr. Craige Cotta as we are both in the [place] office today.

## 2020-05-05 NOTE — Telephone Encounter (Signed)
Order has been sent to [facility] for pt's settings to be changed to 9cm. Nothing further needed.

## 2020-05-09 ENCOUNTER — Other Ambulatory Visit: Payer: Self-pay

## 2020-05-09 ENCOUNTER — Ambulatory Visit (INDEPENDENT_AMBULATORY_CARE_PROVIDER_SITE_OTHER): Payer: Medicare HMO | Admitting: Internal Medicine

## 2020-05-09 ENCOUNTER — Encounter: Payer: Self-pay | Admitting: Internal Medicine

## 2020-05-09 DIAGNOSIS — G253 Myoclonus: Secondary | ICD-10-CM | POA: Diagnosis not present

## 2020-05-09 DIAGNOSIS — J208 Acute bronchitis due to other specified organisms: Secondary | ICD-10-CM | POA: Diagnosis not present

## 2020-05-09 DIAGNOSIS — A310 Pulmonary mycobacterial infection: Secondary | ICD-10-CM

## 2020-05-09 DIAGNOSIS — J209 Acute bronchitis, unspecified: Secondary | ICD-10-CM | POA: Insufficient documentation

## 2020-05-09 DIAGNOSIS — R300 Dysuria: Secondary | ICD-10-CM

## 2020-05-09 MED ORDER — BENZONATATE 100 MG PO CAPS: 100.0000 mg | ORAL_CAPSULE | Freq: Three times a day (TID) | ORAL | 1 refills | Status: DC | PRN

## 2020-05-09 MED ORDER — CIPROFLOXACIN HCL 500 MG PO TABS: 500.0000 mg | ORAL_TABLET | Freq: Every day | ORAL | 0 refills | Status: DC

## 2020-05-09 NOTE — Progress Notes (Signed)
[facility] for Infectious [facility]  Patient Active Problem List   Diagnosis Date Noted  . Acute bronchitis 05/09/2020    Priority: High  . Dysuria 05/09/2020    Priority: High  . Bronchiectasis ([place]) 11/27/2018    Priority: High  . Altered mental status   . AKI (acute kidney injury) ([place]) 07/14/2019  . Thrombocytopenia ([place]) 07/14/2019  . Hypothyroidism 07/14/2019  . Acute metabolic encephalopathy 35/59/7416  . 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 Chronic respiratory failure with hypoxia ([place]) 12/07/2018  . 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 Thrush 11/27/2018  . CAD (coronary artery disease) 11/27/2018  . CKD (chronic kidney disease), stage III ([place]) 11/27/2018  . Essential hypertension 11/27/2018  . Generalized anxiety disorder 11/27/2018  . Peripheral neuropathy 11/27/2018  . 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   CIPROFLOXACIN (CIPRO) 500 MG TABLET    Take 1 tablet (500 mg total) by mouth daily.  Previous Medications   ACETAMINOPHEN (TYLENOL) 650 MG CR TABLET    Take 650 mg by mouth every 8 (eight) hours as needed for pain.    ASPIRIN EC 81 MG TABLET    Take 81 mg by mouth daily.   BUPROPION (WELLBUTRIN XL) 300 MG 24 HR TABLET    Take 300 mg by mouth daily.    CETIRIZINE (ZYRTEC) 10 MG TABLET    Take 10 mg by mouth daily as needed for allergies.   FUROSEMIDE (LASIX) 20 MG TABLET    Take 1 tablet (20 mg total) by mouth daily as needed.   GABAPENTIN (NEURONTIN) 300 MG CAPSULE    Take 300 mg by mouth 2 (two) times daily.   IPRATROPIUM-ALBUTEROL (DUONEB) 0.5-2.5 (3) MG/3ML SOLN    Take 3 mLs by nebulization 3 (three) times daily as needed.   LEVOTHYROXINE (SYNTHROID, LEVOTHROID) 50 MCG TABLET    Take 50-100 mcg by mouth See admin instructions. Take 50 mg  tablet daily on Monday  through Friday  Take 100 mcg  Saturday and Sunday   METOPROLOL SUCCINATE (TOPROL XL) 25 MG 24 HR TABLET    Take 1 tablet (25 mg total) by mouth daily.   MULTIPLE VITAMIN (MULTIVITAMIN WITH MINERALS) TABS TABLET    Take 1 tablet by mouth daily.   NEBULIZERS (COMPRESSOR/NEBULIZER) MISC    1 Product by Does not apply route once for 1 dose.   NITROFURANTOIN, MACROCRYSTAL-MONOHYDRATE, (MACROBID) 100 MG CAPSULE    Take 100 mg by mouth daily.   OXYGEN    Inhale 2-3 L into the lungs See admin instructions. As needed and at bedtime use 3 liters with CPAP   POLYETHYLENE GLYCOL POWDER (GLYCOLAX/MIRALAX) 17 GM/SCOOP POWDER    Take 17 g by mouth daily as needed for moderate constipation.    ROSUVASTATIN (CRESTOR) 10 MG TABLET    Take 10 mg by mouth daily.   SENNA (SENOKOT) 8.6 MG TABLET    Take 1 tablet by mouth daily as needed for constipation.    SODIUM CHLORIDE HYPERTONIC 3 % NEBULIZER SOLUTION    Take 4 mLs by nebulization 2 (two) times daily as needed for other.   ZOLPIDEM (AMBIEN) 5 MG TABLET    Take 5 mg by mouth at bedtime as needed for sleep.  Modified Medications   No medications on file  Discontinued  Medications   No medications on file    Subjective: Stacy Sanchez is in for a work in visit. She has chronic bronchiectasis and sleep apnea. She has a remote history of Mycobacterium avium infection diagnosed in 2008. She had been on multiple regimens under the care of Dr. Brigitte Pulse at [facility] up until 2019. Her treatment was complicated by multiple antibiotic allergies and intolerances. She was treated for acute bronchitis in March of last year with IV cefepime and developed acute neurotoxicity and nephrotoxicity. This resolved promptly after cefepime was stopped.  Shortly around Christmas she developed worsening cough productive of increased green sputum. She has not had any change in her chronic shortness of breath. She has not had any fever, chills or sweats. Her cough is  particularly bad at night and makes it difficult for her to wear her CPAP mask.  She saw her PCP for an annual wellness visit last week. He had been having some suprapubic pressure for several weeks. A urine culture was obtained that grew Enterococcus. She was prescribed nitrofurantoin but just picked up the prescription today and has not started it.  Review of Systems: Review of Systems  Constitutional: Negative for chills, diaphoresis, fever and weight loss.  Respiratory: Positive for cough, sputum production, shortness of breath and wheezing. Negative for hemoptysis.   Cardiovascular: Negative for chest pain.  Gastrointestinal: Negative for diarrhea, nausea and vomiting.  Genitourinary: Positive for dysuria.    Past Medical History:  Diagnosis Date  . Arthritis   . CHF (congestive heart failure) ([place])   . Hypertension   . Mycobacterium abscessus infection   . Mycobacterium avium complex ([place])   . Pseudomonas respiratory infection   . Thyroid disease     Social History   Tobacco Use  . Smoking status: Never Smoker  . Smokeless tobacco: Never Used  Substance Use Topics  . Alcohol use: No  . Drug use: No    Family History  Problem Relation Age of Onset  . Thyroid disease Daughter     Allergies  Allergen Reactions  . Losartan Cough  . Amikacin Other (See Comments)    nephropathy   . Cefepime Other (See Comments)    Tremors Slurred speech  AKI  . Ethambutol Other (See Comments)    neuropathy  . Levaquin [Levofloxacin] Other (See Comments)    Fainting and weakness  . Parafon Forte Dsc [Chlorzoxazone] Hives  . Rifabutin Diarrhea  . Tequin [Gatifloxacin] Other (See Comments)    Fainting and weakness  . Zyvox [Linezolid] Diarrhea  . Lisinopril Nausea Only    Very bitter taste in mouth caused anorexia    Objective: Vitals:   05/09/20 1543  BP: (!) 160/96  Pulse: 80  Temp: 98 F (36.7 C)  TempSrc: Oral  SpO2: 97%  Weight: 115 lb (52.2 kg)  Height: _0   (1.702 m)   Body mass index is 18.01 kg/m.  Physical Exam Constitutional:      General: She is not in acute distress.    Comments: She is calm and pleasant as usual.  Cardiovascular:     Rate and Rhythm: Normal rate and regular rhythm.     Heart sounds: No murmur heard.   Pulmonary:     Effort: Pulmonary effort is normal.     Breath sounds: Rhonchi present. No wheezing or rales.     Comments: She has a frequent loose cough during the exam. Abdominal:     Palpations: Abdomen is soft.  Skin:  Findings: No rash.  Neurological:     General: No focal deficit present.     Lab Results    Problem List Items Addressed This Visit      High   Acute bronchitis    She has an acute exacerbation of her chronic bronchitis. Sputum culture obtained last week grew Pseudomonas susceptible to all antibiotics tested. AFB stain was negative and AFB culture is pending. In addition to a serious adverse reaction to cefepime as she has a history of "allergies" to levofloxacin and gatifloxacin. She does not recall what sort of reaction she had. I talked to her about the potential risk and benefits of trying ciprofloxacin to treat possible acute Pseudomonas bronchitis. She would like to try ciprofloxacin to see if she improves. She knows to stop ciprofloxacin and call me right away if she has any significant side effects. She will follow-up in 1 week.      Relevant Medications   benzonatate (TESSALON PERLES) 100 MG capsule   Dysuria    I am not sure if she truly has a symptomatic urinary tract infection. I would not like to start her on both ciprofloxacin and nitrofurantoin (or better yet amoxicillin) until we know if she can tolerate ciprofloxacin. I told her to hold off on starting nitrofurantoin and will readdress this when she follows up with me next week.        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 AFB cultures are pending.      Relevant Medications   nitrofurantoin, macrocrystal-monohydrate, (MACROBID) 100 MG capsule   Myoclonus   Relevant Medications   ciprofloxacin (CIPRO) 500 MG tablet       Michel Bickers, MD [facility] for [facility] [phone] pager   [phone] cell 05/09/2020, 4:39 PM

## 2020-05-09 NOTE — Assessment & Plan Note (Signed)
I am not sure if she truly has a symptomatic urinary tract infection. I would not like to start her on both ciprofloxacin and nitrofurantoin (or better yet amoxicillin) until we know if she can tolerate ciprofloxacin. I told her to hold off on starting nitrofurantoin and will readdress this when she follows up with me next week.

## 2020-05-09 NOTE — Assessment & Plan Note (Signed)
She has an acute exacerbation of her chronic bronchitis. Sputum culture obtained last week grew Pseudomonas susceptible to all antibiotics tested. AFB stain was negative and AFB culture is pending. In addition to a serious adverse reaction to cefepime as she has a history of "allergies" to levofloxacin and gatifloxacin. She does not recall what sort of reaction she had. I talked to her about the potential risk and benefits of trying ciprofloxacin to treat possible acute Pseudomonas bronchitis. She would like to try ciprofloxacin to see if she improves. She knows to stop ciprofloxacin and call me right away if she has any significant side effects. She will follow-up in 1 week.

## 2020-05-09 NOTE — Assessment & Plan Note (Signed)
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 AFB cultures are pending.

## 2020-05-10 ENCOUNTER — Other Ambulatory Visit: Payer: Self-pay | Admitting: *Deleted

## 2020-05-10 DIAGNOSIS — J208 Acute bronchitis due to other specified organisms: Secondary | ICD-10-CM

## 2020-05-10 DIAGNOSIS — G253 Myoclonus: Secondary | ICD-10-CM

## 2020-05-10 NOTE — Progress Notes (Signed)
Prescriptions were picked up by wrong person at the pharmacy. Spoke with [employer], pharmacist, who will refile prescriptions for pickup today and will contact the other patient involved.  Andree Coss, RN

## 2020-05-16 ENCOUNTER — Ambulatory Visit: Payer: Medicare HMO | Admitting: Internal Medicine

## 2020-05-18 ENCOUNTER — Ambulatory Visit (INDEPENDENT_AMBULATORY_CARE_PROVIDER_SITE_OTHER): Payer: Medicare HMO | Admitting: Internal Medicine

## 2020-05-18 ENCOUNTER — Other Ambulatory Visit: Payer: Self-pay

## 2020-05-18 ENCOUNTER — Encounter: Payer: Self-pay | Admitting: Internal Medicine

## 2020-05-18 DIAGNOSIS — J208 Acute bronchitis due to other specified organisms: Secondary | ICD-10-CM | POA: Diagnosis not present

## 2020-05-18 DIAGNOSIS — R300 Dysuria: Secondary | ICD-10-CM

## 2020-05-18 NOTE — Assessment & Plan Note (Signed)
Her acute exacerbation of her chronic bronchiectasis improved with a short course of oral ciprofloxacin targeting Pseudomonas. Her AFB cultures remain negative.  We both agreed to monitor off of antibiotics for now.  She will follow-up here as needed.

## 2020-05-18 NOTE — Assessment & Plan Note (Signed)
Mild suprapubic discomfort and dysuria improved after a course of ciprofloxacin although recent cultures grew Enterococcus.  I would not treat her with any further antibiotics at this time.  Will be very important to use antibiotics in the future very judiciously given her history of multiple antibiotic allergies and intolerances and risk for development systemic organisms.

## 2020-05-18 NOTE — Progress Notes (Signed)
[facility] for Infectious Disease  Patient Active Problem List   Diagnosis Date Noted  . Acute bronchitis 05/09/2020    Priority: High  . Dysuria 05/09/2020    Priority: High  . Bronchiectasis ([place]) 11/27/2018    Priority: High  . Altered mental status   . AKI (acute kidney injury) ([place]) 07/14/2019  . Thrombocytopenia ([place]) 07/14/2019  . Hypothyroidism 07/14/2019  . Acute metabolic encephalopathy 59/56/3875  . 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 Chronic respiratory failure with hypoxia ([place]) 12/07/2018  . 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 Thrush 11/27/2018  . CAD (coronary artery disease) 11/27/2018  . CKD (chronic kidney disease), stage III ([place]) 11/27/2018  . Essential hypertension 11/27/2018  . Generalized anxiety disorder 11/27/2018  . Peripheral neuropathy 11/27/2018  . 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    ASPIRIN EC 81 MG TABLET    Take 81 mg by mouth daily.   BENZONATATE (TESSALON PERLES) 100 MG CAPSULE    Take 1 capsule (100 mg total) by mouth 3 (three) times daily as needed for cough.   BUPROPION (WELLBUTRIN XL) 300 MG 24 HR TABLET    Take 300 mg by mouth daily.    CETIRIZINE (ZYRTEC) 10 MG TABLET    Take 10 mg by mouth daily as needed for allergies.   CIPROFLOXACIN (CIPRO) 500 MG TABLET    Take 1 tablet (500 mg total) by mouth daily.   FUROSEMIDE (LASIX) 20 MG TABLET    Take 1 tablet (20 mg total) by mouth daily as needed.   GABAPENTIN (NEURONTIN) 300 MG CAPSULE    Take 300 mg by mouth 2 (two) times daily.   IPRATROPIUM-ALBUTEROL (DUONEB) 0.5-2.5 (3) MG/3ML SOLN    Take 3 mLs by nebulization 3 (three) times daily as needed.   LEVOTHYROXINE (SYNTHROID, LEVOTHROID) 50 MCG TABLET    Take 50-100 mcg by mouth See admin instructions. Take 50 mg   tablet daily on Monday through Friday  Take 100 mcg  Saturday and Sunday   METOPROLOL SUCCINATE (TOPROL XL) 25 MG 24 HR TABLET    Take 1 tablet (25 mg total) by mouth daily.   MULTIPLE VITAMIN (MULTIVITAMIN WITH MINERALS) TABS TABLET    Take 1 tablet by mouth daily.   NEBULIZERS (COMPRESSOR/NEBULIZER) MISC    1 Product by Does not apply route once for 1 dose.   NITROFURANTOIN, MACROCRYSTAL-MONOHYDRATE, (MACROBID) 100 MG CAPSULE    Take 100 mg by mouth daily.   OXYGEN    Inhale 2-3 L into the lungs See admin instructions. As needed and at bedtime use 3 liters with CPAP   POLYETHYLENE GLYCOL POWDER (GLYCOLAX/MIRALAX) 17 GM/SCOOP POWDER    Take 17 g by mouth daily as needed for moderate constipation.    ROSUVASTATIN (CRESTOR) 10 MG TABLET    Take 10 mg by mouth daily.   SENNA (SENOKOT) 8.6 MG TABLET    Take 1 tablet by mouth daily as needed for constipation.    SODIUM CHLORIDE HYPERTONIC 3 % NEBULIZER SOLUTION    Take 4 mLs by nebulization 2 (two) times daily as needed for other.   ZOLPIDEM (AMBIEN) 5 MG TABLET    Take 5 mg by mouth at bedtime as needed for sleep.  Modified Medications  No medications on file  Discontinued Medications   No medications on file    Subjective: Stacy Sanchez is in for a work in visit. She has chronic bronchiectasis and sleep apnea. She has a remote history of Mycobacterium avium infection diagnosed in 2008. She had been on multiple regimens under the care of Dr. Brigitte Pulse at [facility] up until 2019. Her treatment was complicated by multiple antibiotic allergies and intolerances. She was treated for acute bronchitis in March of last year with IV cefepime and developed acute neurotoxicity and nephrotoxicity. This resolved promptly after cefepime was stopped.  Shortly around Christmas she developed worsening cough productive of increased green sputum. She has not had any change in her chronic shortness of breath. She has not had any fever, chills or  sweats. Her cough is particularly bad at night and makes it difficult for her to wear her CPAP mask.  Sputum cultures were obtained and grew Pseudomonas susceptible to all antibiotics tested.  Sputum AFB stain was negative and AFB cultures remain negative.  She saw her PCP for an annual wellness visit several weeks ago. She had been having some suprapubic pressure for several weeks. A urine culture was obtained that grew Enterococcus. She was prescribed nitrofurantoin but just picked up the prescription today and has not started it.  When I saw her last week we discussed the pros and cons of a trial of oral ciprofloxacin.  She wanted to try that.  She did not have any problems tolerating it and noted some improvement in her cough.  She has also had some improvement in her suprapubic discomfort.  Review of Systems: Review of Systems  Constitutional: Negative for chills, diaphoresis, fever and weight loss.  Respiratory: Positive for cough, sputum production and shortness of breath. Negative for hemoptysis and wheezing.   Cardiovascular: Negative for chest pain.  Gastrointestinal: Negative for diarrhea, nausea and vomiting.  Genitourinary: Negative for dysuria.    Past Medical History:  Diagnosis Date  . Arthritis   . CHF (congestive heart failure) ([place])   . Hypertension   . Mycobacterium abscessus infection   . Mycobacterium avium complex ([place])   . Pseudomonas respiratory infection   . Thyroid disease     Social History   Tobacco Use  . Smoking status: Never Smoker  . Smokeless tobacco: Never Used  Substance Use Topics  . Alcohol use: No  . Drug use: No    Family History  Problem Relation Age of Onset  . Thyroid disease Daughter     Allergies  Allergen Reactions  . Losartan Cough  . Amikacin Other (See Comments)    nephropathy   . Cefepime Other (See Comments)    Tremors Slurred speech  AKI  . Ethambutol Other (See Comments)    neuropathy  . Levaquin [Levofloxacin]  Other (See Comments)    Fainting and weakness  . Parafon Forte Dsc [Chlorzoxazone] Hives  . Rifabutin Diarrhea  . Tequin [Gatifloxacin] Other (See Comments)    Fainting and weakness  . Zyvox [Linezolid] Diarrhea  . Lisinopril Nausea Only    Very bitter taste in mouth caused anorexia    Objective: Vitals:   05/18/20 1026  BP: (!) 145/90  Pulse: 80  Temp: (!) 97.5 F (36.4 C)  TempSrc: Oral  SpO2: 97%  Weight: 115 lb (52.2 kg)  Height: _0  (1.702 m)   Body mass index is 18.01 kg/m.  Physical Exam Constitutional:      General: She is not in acute  distress.    Comments: She is in good spirits.  Cardiovascular:     Rate and Rhythm: Normal rate and regular rhythm.     Heart sounds: No murmur heard.   Pulmonary:     Effort: Pulmonary effort is normal.     Breath sounds: No wheezing, rhonchi or rales.  Abdominal:     Palpations: Abdomen is soft.  Skin:    Findings: No rash.  Neurological:     General: No focal deficit present.     Lab Results    Problem List Items Addressed This Visit      High   Acute bronchitis    Her acute exacerbation of her chronic bronchiectasis improved with a short course of oral ciprofloxacin targeting Pseudomonas. Her AFB cultures remain negative.  We both agreed to monitor off of antibiotics for now.  She will follow-up here as needed.      Dysuria    Mild suprapubic discomfort and dysuria improved after a course of ciprofloxacin although recent cultures grew Enterococcus.  I would not treat her with any further antibiotics at this time.  Will be very important to use antibiotics in the future very judiciously given her history of multiple antibiotic allergies and intolerances and risk for development systemic organisms.          Michel Bickers, MD [facility] [place] [facility] [phone] pager   [phone] cell 05/18/2020, 10:49 AM

## 2020-06-06 NOTE — Telephone Encounter (Signed)
Please advise on patient mychart message     My [facility] pharmacy is unable to get Sodium Chloride 3% for inhalation .   I tried [facility] and they tried several pharmacies and no one seems to be able to get it.  Is it OK that I just use my Duoneb  for inhalation? Theotis Barrio

## 2020-06-08 ENCOUNTER — Encounter (HOSPITAL_COMMUNITY): Payer: Self-pay

## 2020-06-14 ENCOUNTER — Telehealth (HOSPITAL_COMMUNITY): Payer: Self-pay | Admitting: *Deleted

## 2020-06-14 NOTE — Telephone Encounter (Addendum)
Pt left vm stating yesterday she felt dizzy around 4pm and thought she was going to pass out but she fell to the ground. Pt did not injure herself. Per Melany Guernsey pt needs to come in for a nurse visit for bp and ekg check. Unable to access her biotronik device may need interrogation during nurse visit. Pt aware and agreeable with plan. Nurse visit scheduled.

## 2020-06-15 ENCOUNTER — Ambulatory Visit (HOSPITAL_COMMUNITY)
Admission: RE | Admit: 2020-06-15 | Discharge: 2020-06-15 | Disposition: A | Discharge status: Home / Self Care | Payer: Medicare HMO | Source: Ambulatory Visit | Attending: Internal Medicine | Admitting: Internal Medicine

## 2020-06-15 ENCOUNTER — Other Ambulatory Visit: Payer: Self-pay

## 2020-06-15 DIAGNOSIS — R9431 Abnormal electrocardiogram [ECG] [EKG]: Secondary | ICD-10-CM | POA: Diagnosis not present

## 2020-06-15 DIAGNOSIS — Z0130 Encounter for examination of blood pressure without abnormal findings: Secondary | ICD-10-CM | POA: Insufficient documentation

## 2020-06-15 LAB — MYCOBACTERIA,CULT W/FLUOROCHROME SMEAR
MICRO NUMBER:: 11403593
SMEAR:: NONE SEEN
SPECIMEN QUALITY:: ADEQUATE

## 2020-06-15 LAB — RESPIRATORY CULTURE OR RESPIRATORY AND SPUTUM CULTURE
MICRO NUMBER:: 11403461
SPECIMEN QUALITY:: ADEQUATE

## 2020-06-16 ENCOUNTER — Encounter (HOSPITAL_COMMUNITY): Payer: Self-pay

## 2020-08-29 NOTE — Progress Notes (Signed)
ADVANCED HF CLINIC NOTE  PCP: Glenna Durand, MD HF Cardiologist: Dr. Gala Romney  HPI:  Ms. Wendland is a 75 y/o woman with chronic hypoxic respiratory failure in setting of MAI infection complicated by bronchiectasiswith recurrent Pseudomonal infections. History also notable for severe OSA, patent ductus repair at age 47. She has been referred by Marlin Canary, PA at [facility] for further evaluation of new-onset systolic HF.    She was admitted to [facility] in 10/21 with syncopal spell and diagnosed with new nonischemic cardiomyopathy. Echocardiogram showed EF 20-25%, normal RV, LVIDD 4.6 cm. (Previous echocardiogram 2019 showed EF 50-55%, and either a torn chordae or redundant chordae by report.) Cardiac cath revealed no CAD. EKG showed sinus rhythm with T wave abnormalities with narrow QRS. CT and MRI of head were unremarkable. EP was consulted and ICD was placed.   Recently struggling with prominent sinus tachycardia and progressive HF symptoms.   Underwent RHC on 03/23/20 which showed well compensated hemodynamics with very mild PAH  Zio patch 12/21 1. Sinus rhythm - avg HR of 101 bpm. +NSVT Rare PACs and PVCs  Echo  05/01/20 EF 45-50% RV ok. Personally reviewed  Here for routine f/u with her daughter. Says she is feeling ok. Still SOB with mild activity. [facility] [employer] No edema, orthopnea or PND. Only taking Toprol 12.5 due to low BP. SBP at 80-100. Feels like Toprol gives her bitter taste in her mouth. Was on Nadolol in past and would like to try to go back.   RHC 12/21 RA = 2 RV = 33/2 PA = 35/10 (22) PCW = 6 Fick cardiac output/index = 4.1/2.6 PVR = 3.8 WU Ao sat = 94% PA sat = 61%, 60%    Current Outpatient Medications on File Prior to Encounter  Medication Sig Dispense Refill  . acetaminophen (TYLENOL) 650 MG CR tablet Take 650 mg by mouth every 8 (eight) hours as needed for pain.     Marland Kitchen aspirin EC 81 MG tablet Take 81 mg by mouth daily.    . benzonatate  (TESSALON PERLES) 100 MG capsule Take 1 capsule (100 mg total) by mouth 3 (three) times daily as needed for cough. 30 capsule 1  . buPROPion (WELLBUTRIN XL) 300 MG 24 hr tablet Take 300 mg by mouth daily.     . cetirizine (ZYRTEC) 10 MG tablet Take 10 mg by mouth daily as needed for allergies. (Patient not taking: Reported on 05/18/2020)    . ciprofloxacin (CIPRO) 500 MG tablet Take 1 tablet (500 mg total) by mouth daily. (Patient not taking: Reported on 05/18/2020) 5 tablet 0  . furosemide (LASIX) 20 MG tablet Take 1 tablet (20 mg total) by mouth daily as needed. (Patient not taking: No sig reported) 30 tablet 6  . gabapentin (NEURONTIN) 300 MG capsule Take 300 mg by mouth 2 (two) times daily.    Marland Kitchen ipratropium-albuterol (DUONEB) 0.5-2.5 (3) MG/3ML SOLN Take 3 mLs by nebulization 3 (three) times daily as needed. 810 mL 1  . levothyroxine (SYNTHROID, LEVOTHROID) 50 MCG tablet Take 50-100 mcg by mouth See admin instructions. Take 50 mg  tablet daily on Monday through Friday  Take 100 mcg  Saturday and Sunday    . metoprolol succinate (TOPROL XL) 25 MG 24 hr tablet Take 1 tablet (25 mg total) by mouth daily. 90 tablet 3  . Multiple Vitamin (MULTIVITAMIN WITH MINERALS) TABS tablet Take 1 tablet by mouth daily.    . Nebulizers (COMPRESSOR/NEBULIZER) MISC 1 Product by  Does not apply route once for 1 dose. 1 each 0  . nitrofurantoin, macrocrystal-monohydrate, (MACROBID) 100 MG capsule Take 100 mg by mouth daily. (Patient not taking: Reported on 05/18/2020)    . OXYGEN Inhale 2-3 L into the lungs See admin instructions. As needed and at bedtime use 3 liters with CPAP    . polyethylene glycol powder (GLYCOLAX/MIRALAX) 17 GM/SCOOP powder Take 17 g by mouth daily as needed for moderate constipation.     . rosuvastatin (CRESTOR) 10 MG tablet Take 10 mg by mouth daily.    Marland Kitchen senna (SENOKOT) 8.6 MG tablet Take 1 tablet by mouth daily as needed for constipation.  (Patient not taking: Reported on 05/18/2020)    . sodium  chloride HYPERTONIC 3 % nebulizer solution Take 4 mLs by nebulization 2 (two) times daily as needed for other. 720 mL 2  . zolpidem (AMBIEN) 5 MG tablet Take 5 mg by mouth at bedtime as needed for sleep.     No current facility-administered medications on file prior to encounter.    Today's Vitals   08/30/20 1457  BP: 120/80  Pulse: 75  SpO2: 94%  Weight: 52.1 kg (114 lb 12.8 oz)   Body mass index is 17.98 kg/m.  General:  Elderly. No resp difficulty HEENT: normal Neck: supple. no JVD. Carotids 2+ bilat; no bruits. No lymphadenopathy or thryomegaly appreciated. Cor: PMI nondisplaced. Regular rate & rhythm. No rubs, gallops or murmurs. Lungs: clear Abdomen: soft, nontender, nondistended. No hepatosplenomegaly. No bruits or masses. Good bowel sounds. Extremities: no cyanosis, clubbing, rash, edema Neuro: alert & orientedx3, cranial nerves grossly intact. moves all 4 extremities w/o difficulty. Affect pleasant  Assessment: 1. 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 Echo at [facility] EF 20-25%  - etiology unclear  - cath at [facility] 10/21 no CAD.  - s/p Biotronik ICD in setting of syncopal episode with presumed VT - RHC 12/21 - well compensated - Echo 05/01/20 EF 45-50% RV ok. - NYHA II-III - volume status ok   - On Torprol 12.5 but unable to tolerate due to bad taste in her mouth. Wants to switch to nadolol 10 qhs. Will try  - Unable to tolerate losartan or lisinopril due to cough and low BP - Off digoxin with recovery of LV function  - Would like to add spironolactone but K has been 4.7-5.0 and BP too low    2. H/o syncope - ICD placed 10/21 at [facility] - will switch monitoring to [facility]  3. 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 - followed by Dr. Craige Cotta - ideally would refer to Pulmonary Rehab but avoiding with COVID risk  4. Severe OSA - Only interrmittent  compliance with CPAP.  - Follows with Dr. Craige Cotta  5. H/o patent ductus repair  Arvilla Meres, MD  10:56 PM

## 2020-08-30 ENCOUNTER — Other Ambulatory Visit: Payer: Self-pay

## 2020-08-30 ENCOUNTER — Ambulatory Visit (HOSPITAL_COMMUNITY)
Admission: RE | Admit: 2020-08-30 | Discharge: 2020-08-30 | Disposition: A | Discharge status: Home / Self Care | Payer: Medicare HMO | Source: Ambulatory Visit | Attending: Internal Medicine | Admitting: Internal Medicine

## 2020-08-30 ENCOUNTER — Encounter (HOSPITAL_COMMUNITY): Payer: Self-pay | Admitting: Internal Medicine

## 2020-08-30 VITALS — BP 120/80 | HR 75 | Wt 114.8 lb

## 2020-08-30 DIAGNOSIS — Z95810 Presence of automatic (implantable) cardiac defibrillator: Secondary | ICD-10-CM | POA: Diagnosis not present

## 2020-08-30 DIAGNOSIS — Z7982 Long term (current) use of aspirin: Secondary | ICD-10-CM | POA: Diagnosis not present

## 2020-08-30 DIAGNOSIS — J9611 Chronic respiratory failure with hypoxia: Secondary | ICD-10-CM | POA: Diagnosis not present

## 2020-08-30 DIAGNOSIS — I5022 Chronic systolic (congestive) heart failure: Secondary | ICD-10-CM

## 2020-08-30 DIAGNOSIS — Z79899 Other long term (current) drug therapy: Secondary | ICD-10-CM | POA: Diagnosis not present

## 2020-08-30 DIAGNOSIS — G4733 Obstructive sleep apnea (adult) (pediatric): Secondary | ICD-10-CM | POA: Diagnosis not present

## 2020-08-30 DIAGNOSIS — I428 Other cardiomyopathies: Secondary | ICD-10-CM | POA: Diagnosis not present

## 2020-08-30 DIAGNOSIS — I5020 Unspecified systolic (congestive) heart failure: Secondary | ICD-10-CM | POA: Insufficient documentation

## 2020-08-30 MED ORDER — NADOLOL 20 MG PO TABS: 10.0000 mg | ORAL_TABLET | Freq: Every day | ORAL | 11 refills | Status: DC

## 2020-08-30 NOTE — Patient Instructions (Addendum)
No Labs done today.   STOP taking Metoprolol  START taking Nadolol 10mg  (1/2 tablet) by mouth daily at bedtime.  No other medication changes were made. Please continue all current medications as prescribed.  Your physician recommends that you schedule a follow-up appointment in: 6 months for an echo and an appointment with Dr. . Please contact our office in October for a November appointment.  Your physician has requested that you have an echocardiogram. Echocardiography is a painless test that uses sound waves to create images of your heart. It provides your doctor with information about the size and shape of your heart and how well your heart's chambers and valves are working. This procedure takes approximately one hour. There are no restrictions for this procedure.   If you have any questions or concerns before your next appointment please send December a message through [place] or call our office at [phone].    TO LEAVE A MESSAGE FOR THE NURSE SELECT OPTION 2, PLEASE LEAVE A MESSAGE INCLUDING: . YOUR NAME . DATE OF BIRTH . CALL BACK NUMBER . REASON FOR CALL**this is important as we prioritize the call backs  YOU WILL RECEIVE A CALL BACK THE SAME DAY AS LONG AS YOU CALL BEFORE 4:00 PM   Do the following things EVERYDAY: 1) Weigh yourself in the morning before breakfast. Write it down and keep it in a log. 2) Take your medicines as prescribed 3) Eat low salt foods--Limit salt (sodium) to 2000 mg per day.  4) Stay as active as you can everyday 5) Limit all fluids for the day to less than 2 liters   At the [facility], you and your health needs are our priority. As part of our continuing mission to provide you with exceptional heart care, we have created designated Provider Care Teams. These Care Teams include your primary Cardiologist (physician) and Advanced Practice Providers (APPs- Physician Assistants and Nurse Practitioners) who all work together to  provide you with the care you need, when you need it.   You may see any of the following providers on your designated Care Team at your next follow up: [phone] Dr Marland Kitchen . Dr Arvilla Meres . Marca Ancona, NP . Tonye Becket, PA . Robbie Lis, PharmD   Please be sure to bring in all your medications bottles to every appointment.

## 2020-08-30 NOTE — Addendum Note (Signed)
Encounter addended by: Dolores Patty, MD on: 08/30/2020 4:34 PM  Actions taken: Level of Service modified, Visit diagnoses modified

## 2020-12-24 ENCOUNTER — Emergency Department (HOSPITAL_BASED_OUTPATIENT_CLINIC_OR_DEPARTMENT_OTHER): Payer: Medicare HMO

## 2020-12-24 ENCOUNTER — Emergency Department (HOSPITAL_BASED_OUTPATIENT_CLINIC_OR_DEPARTMENT_OTHER)
Admission: EM | Admit: 2020-12-24 | Discharge: 2020-12-25 | Disposition: A | Discharge status: Home / Self Care | Payer: Medicare HMO | Attending: Emergency Medicine | Admitting: Emergency Medicine

## 2020-12-24 ENCOUNTER — Other Ambulatory Visit: Payer: Self-pay

## 2020-12-24 ENCOUNTER — Encounter (HOSPITAL_BASED_OUTPATIENT_CLINIC_OR_DEPARTMENT_OTHER): Payer: Self-pay | Admitting: Obstetrics and Gynecology

## 2020-12-24 DIAGNOSIS — I509 Heart failure, unspecified: Secondary | ICD-10-CM | POA: Insufficient documentation

## 2020-12-24 DIAGNOSIS — I2510 Atherosclerotic heart disease of native coronary artery without angina pectoris: Secondary | ICD-10-CM | POA: Diagnosis not present

## 2020-12-24 DIAGNOSIS — Z7982 Long term (current) use of aspirin: Secondary | ICD-10-CM | POA: Diagnosis not present

## 2020-12-24 DIAGNOSIS — Z79899 Other long term (current) drug therapy: Secondary | ICD-10-CM | POA: Diagnosis not present

## 2020-12-24 DIAGNOSIS — R0602 Shortness of breath: Secondary | ICD-10-CM | POA: Diagnosis not present

## 2020-12-24 DIAGNOSIS — R1013 Epigastric pain: Secondary | ICD-10-CM | POA: Diagnosis not present

## 2020-12-24 DIAGNOSIS — N183 Chronic kidney disease, stage 3 unspecified: Secondary | ICD-10-CM | POA: Insufficient documentation

## 2020-12-24 DIAGNOSIS — R531 Weakness: Secondary | ICD-10-CM | POA: Insufficient documentation

## 2020-12-24 DIAGNOSIS — R5383 Other fatigue: Secondary | ICD-10-CM | POA: Insufficient documentation

## 2020-12-24 DIAGNOSIS — I1310 Hypertensive heart and chronic kidney disease without heart failure, with stage 1 through stage 4 chronic kidney disease, or unspecified chronic kidney disease: Secondary | ICD-10-CM | POA: Insufficient documentation

## 2020-12-24 DIAGNOSIS — R1310 Dysphagia, unspecified: Secondary | ICD-10-CM | POA: Insufficient documentation

## 2020-12-24 DIAGNOSIS — E039 Hypothyroidism, unspecified: Secondary | ICD-10-CM | POA: Diagnosis not present

## 2020-12-24 DIAGNOSIS — Z7952 Long term (current) use of systemic steroids: Secondary | ICD-10-CM | POA: Insufficient documentation

## 2020-12-24 DIAGNOSIS — R634 Abnormal weight loss: Secondary | ICD-10-CM

## 2020-12-24 LAB — URINALYSIS, ROUTINE W REFLEX MICROSCOPIC
Bilirubin Urine: NEGATIVE
Glucose, UA: NEGATIVE mg/dL
Hgb urine dipstick: NEGATIVE
Ketones, ur: NEGATIVE mg/dL
Nitrite: NEGATIVE
Protein, ur: 30 mg/dL — AB
Specific Gravity, Urine: 1.021 (ref 1.005–1.030)
pH: 6 (ref 5.0–8.0)

## 2020-12-24 LAB — BASIC METABOLIC PANEL
Anion gap: 7 (ref 5–15)
BUN: 28 mg/dL — ABNORMAL HIGH (ref 8–23)
CO2: 35 mmol/L — ABNORMAL HIGH (ref 22–32)
Calcium: 9.5 mg/dL (ref 8.9–10.3)
Chloride: 94 mmol/L — ABNORMAL LOW (ref 98–111)
Creatinine, Ser: 1.4 mg/dL — ABNORMAL HIGH (ref 0.44–1.00)
GFR, Estimated: 39 mL/min — ABNORMAL LOW (ref >60)
Glucose, Bld: 88 mg/dL (ref 70–99)
Potassium: 4.4 mmol/L (ref 3.5–5.1)
Sodium: 136 mmol/L (ref 135–145)

## 2020-12-24 LAB — CBC
HCT: 42.3 % (ref 36.0–46.0)
Hemoglobin: 13.3 g/dL (ref 12.0–15.0)
MCH: 29.3 pg (ref 26.0–34.0)
MCHC: 31.4 g/dL (ref 30.0–36.0)
MCV: 93.2 fL (ref 80.0–100.0)
Platelets: 242 10*3/uL (ref 150–400)
RBC: 4.54 MIL/uL (ref 3.87–5.11)
RDW: 14.7 % (ref 11.5–15.5)
WBC: 8.4 10*3/uL (ref 4.0–10.5)
nRBC: 0 % (ref 0.0–0.2)

## 2020-12-24 LAB — BRAIN NATRIURETIC PEPTIDE: B Natriuretic Peptide: 263.9 pg/mL — ABNORMAL HIGH (ref 0.0–100.0)

## 2020-12-24 LAB — TROPONIN I (HIGH SENSITIVITY)
Troponin I (High Sensitivity): 16 ng/L (ref <18)
Troponin I (High Sensitivity): 16 ng/L (ref <18)

## 2020-12-24 LAB — LIPASE, BLOOD: Lipase: 35 U/L (ref 11–51)

## 2020-12-24 LAB — MAGNESIUM: Magnesium: 1.9 mg/dL (ref 1.7–2.4)

## 2020-12-24 IMAGING — CT CT NECK W/ CM
3 of 5 series · 10 of 35 positions shown, 12 images · IV contrast (APPLIED)
Comparison: For concomitant CT of the chest.

CLINICAL DATA: Initial evaluation for dysphagia, weight loss.

EXAM:
CT NECK WITH CONTRAST
TECHNIQUE: Multidetector CT imaging of the neck was performed using the
standard protocol following the bolus administration of intravenous
contrast.
CONTRAST:  50mL OMNIPAQUE IOHEXOL 350 MG/ML SOLN

[Series 5: cor neck (person_name) · coronal · 0.39mm/px · 3 of 90 slices shown]
[im 18/90  bone]
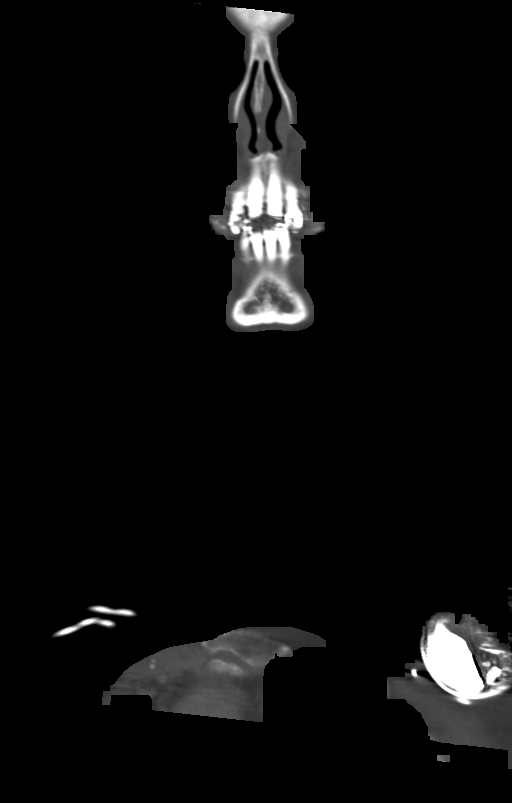
[im 36/90  bone]
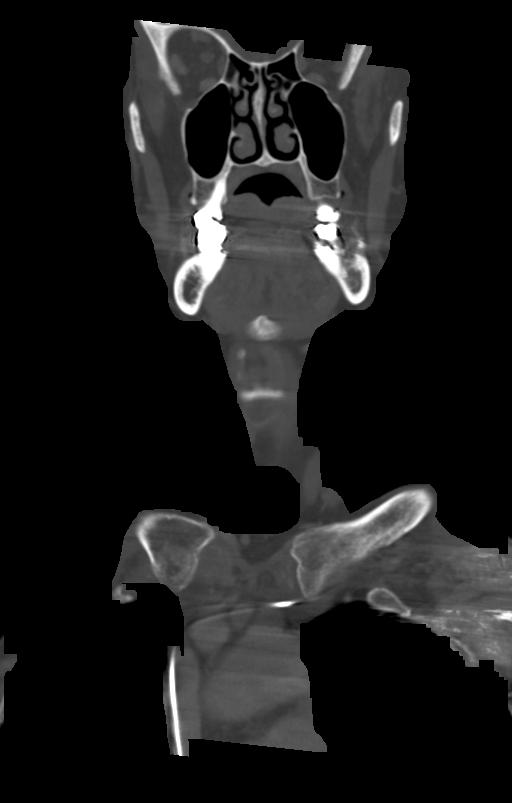
[im 54/90  bone]
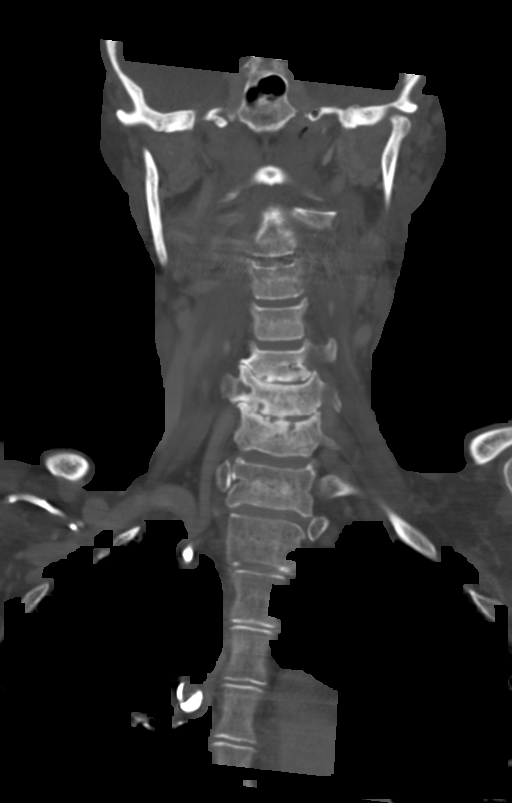

[Series 6: sag neck · sagittal · 0.36mm/px · 5 of 100 slices shown, 6 images]
[im 34/100  bone]
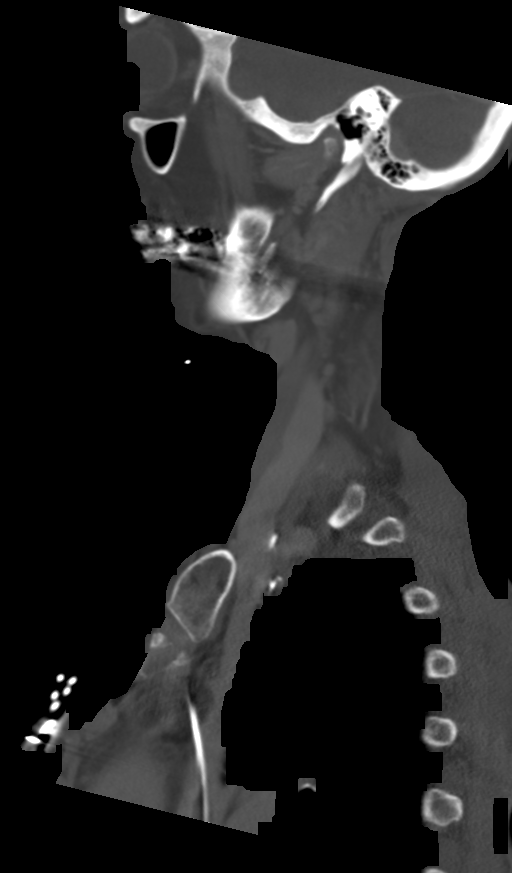
[im 42/100  bone]
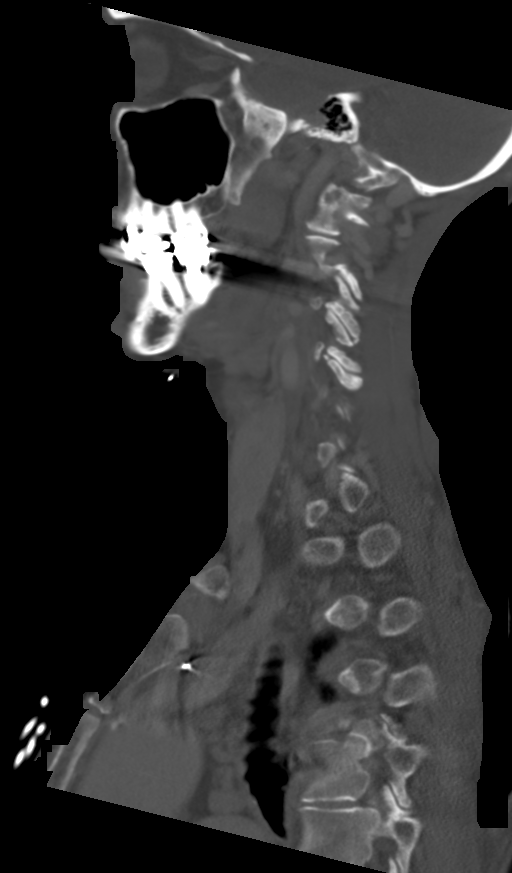
[im 50/100  soft-tissue]
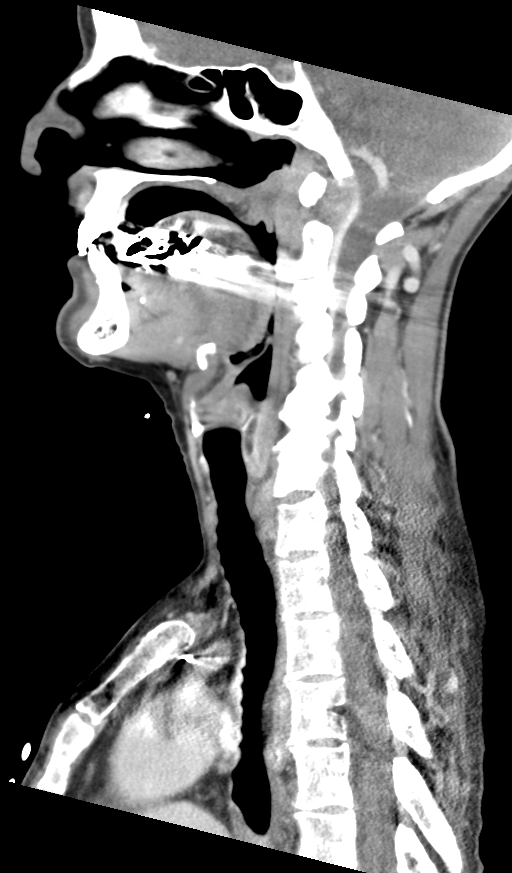
[im 50/100  bone]
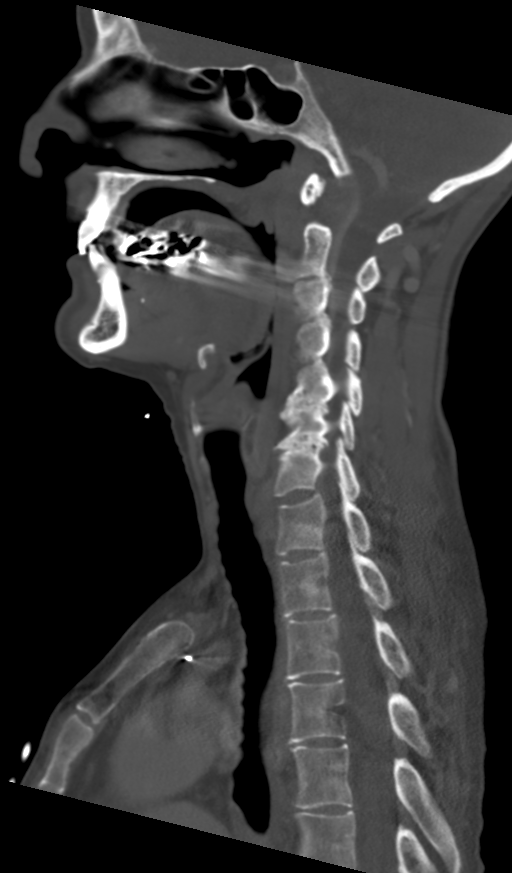
[im 58/100  bone]
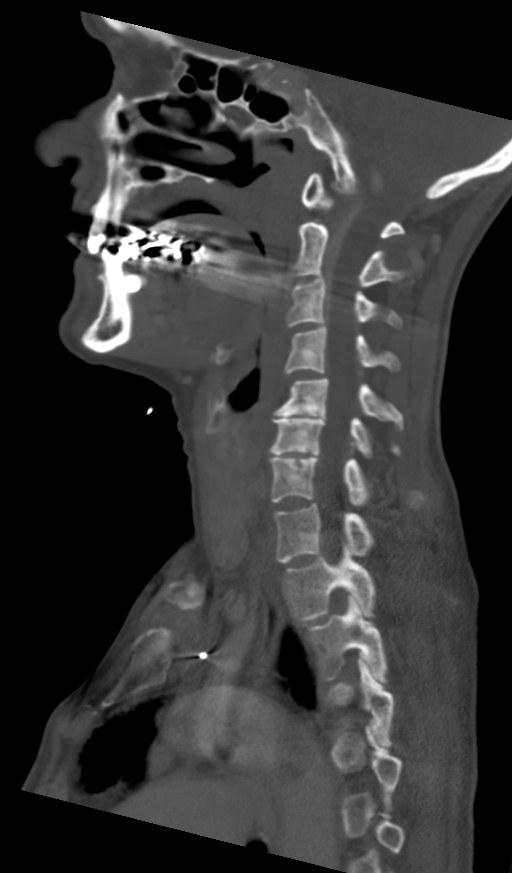
[im 67/100  bone]
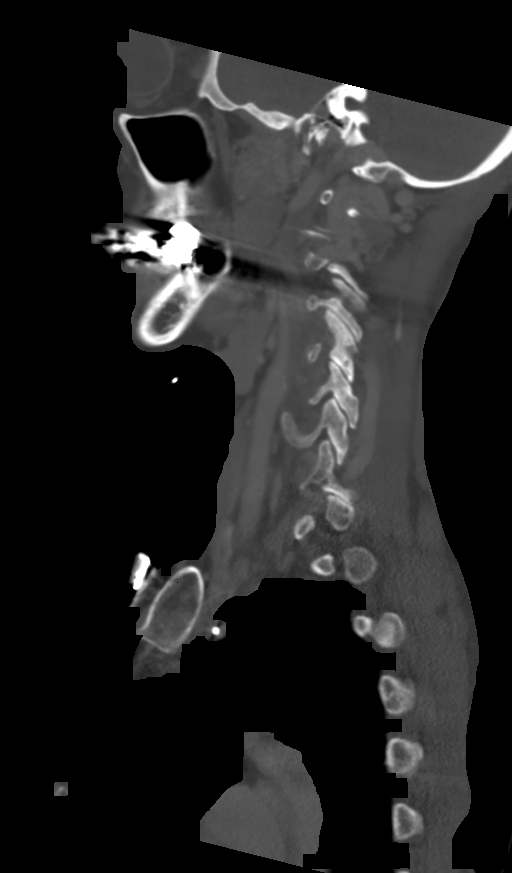

[Series 7: ax oropharynx (person_name) · axial · 0.36mm/px · z∈[-331,-178]mm · 2 of 154 slices shown, 3 images]
[im 39/154  soft-tissue]
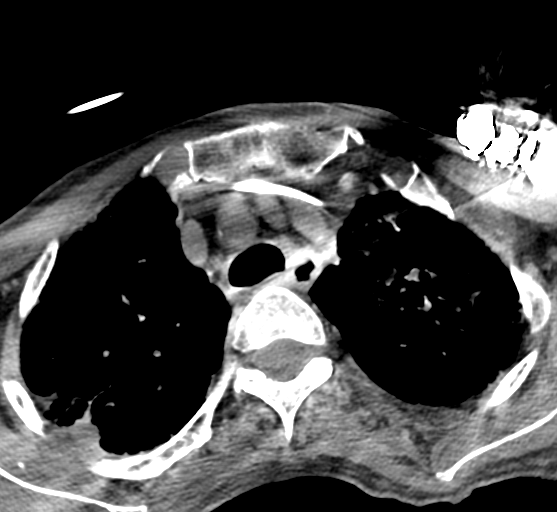
[im 39/154  bone]
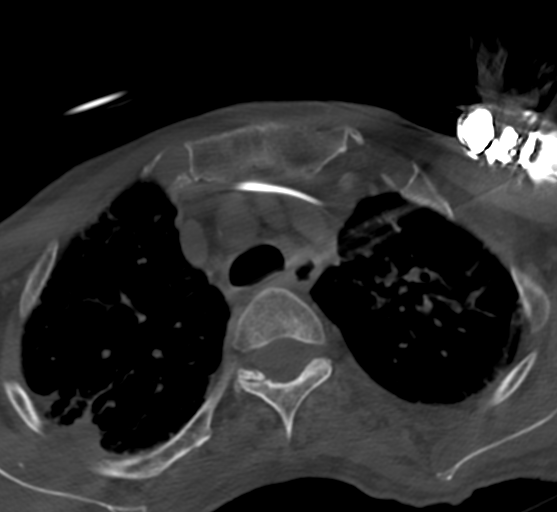
[im 115/154  bone]
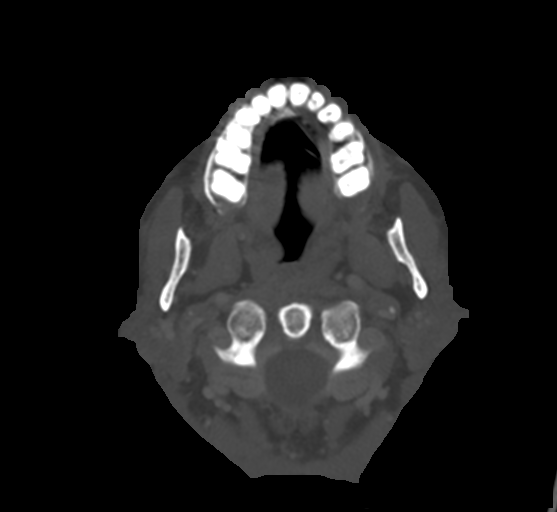

[10 of 35 positions shown; findings below may reference images not displayed]

FINDINGS: Pharynx and larynx: Evaluation of the oral cavity somewhat limited
by streak artifact from dental amalgam. No visible mass lesion or
other abnormality. No acute abnormality seen about the dentition. No
base of tongue lesion. Oropharynx and nasopharynx within normal
limits. No retropharyngeal collection or swelling. Epiglottis within
normal limits. Vallecula clear. Small amount of layering secretions
noted within the hypopharynx. Remainder of the hypopharynx and
supraglottic larynx otherwise unremarkable. Glottis grossly
symmetric and within normal limits. Subglottic airway patent clear.

Salivary glands: Salivary glands including the parotid and
submandibular glands are within normal limits.

Thyroid: 8 mm nodule present at the inferior left thyroid lobe, of
doubtful significance given size and patient age, no follow-up
imaging recommended (ref: [HOSPITAL]. [DATE]): 143-50).

Lymph nodes: No enlarged or pathologic adenopathy seen within the
neck.

Vascular: Normal intravascular enhancement seen throughout the neck.

Limited intracranial: Unremarkable.

Visualized orbits: Prior bilateral ocular lens replacement.
Otherwise unremarkable.

Mastoids and visualized paranasal sinuses: Minimal mucosal
thickening noted about the ethmoidal air cells. Visualized paranasal
sinuses are otherwise clear. Visualized mastoid air cells and middle
ear cavities are well pneumatized and free of fluid.

Skeleton: No visible acute osseous finding. No discrete or worrisome
osseous lesions. Moderate spondylosis noted at C5-6 and C6-7 without
high-grade spinal stenosis.

Upper chest: Extensive pleuroparenchymal scarring with
bronchiectatic changes noted within the visualized lungs. Left-sided
pacemaker/AICD in place. Findings better evaluated on concomitant CT
of the chest.

Other: None.
IMPRESSION: 1. Negative CT of the neck. No acute inflammatory changes or other
abnormality identified. No mass lesion or adenopathy.
2. Moderate spondylosis at C5-6 and C6-7 without high-grade spinal
stenosis.
3. Extensive pleuroparenchymal scarring with bronchiectatic changes
within the visualized lungs, better evaluated on concomitant CT of
the chest.

## 2020-12-24 IMAGING — DX DG CHEST 1V PORT
1 series · 1 of 1 positions shown · non-contrast
Comparison: Chest radiograph [DATE]

CLINICAL DATA: Weakness and dizziness

EXAM:
PORTABLE CHEST 1 VIEW

[chest]
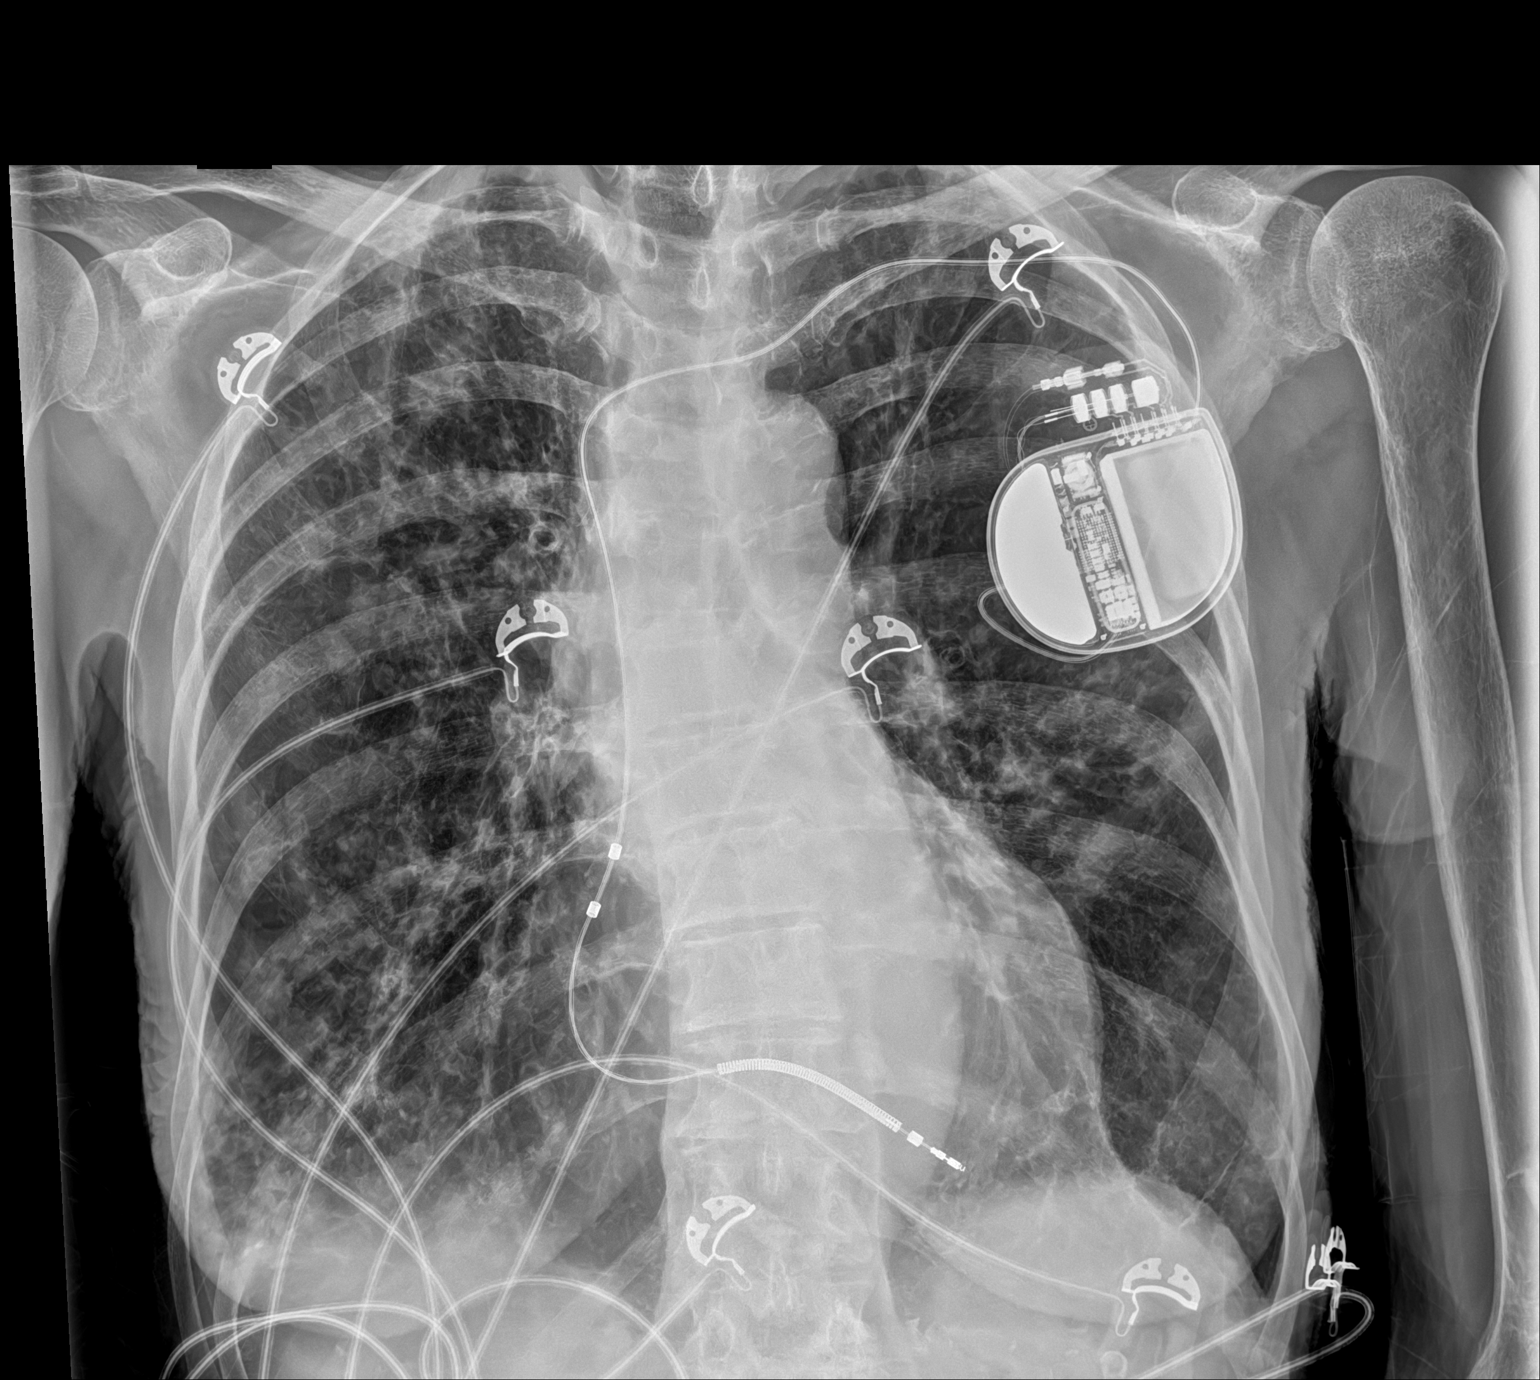

[1 of 1 positions shown; findings below may reference images not displayed]

FINDINGS: Single lead AICD device overlies left hemithorax there is tortuosity
of the thoracic aorta. Similar bilateral coarse interstitial
opacities. No new superimposed area of consolidation identified. No
pleural effusion or pneumothorax.
IMPRESSION: Redemonstrated scattered coarse interstitial opacities within the
right greater than left lungs.

## 2020-12-24 IMAGING — US US RENAL
1 series · 13 of 25 positions shown · non-contrast
Comparison: Contrast-enhanced CT earlier today. Renal ultrasound
[DATE]

CLINICAL DATA: Ultrasound characterization of left renal lesion on
CT.

EXAM:
RENAL / URINARY TRACT ULTRASOUND COMPLETE

[Series 1: us renal · 107 acquisitions, 13 frames shown]
[im 1/107]
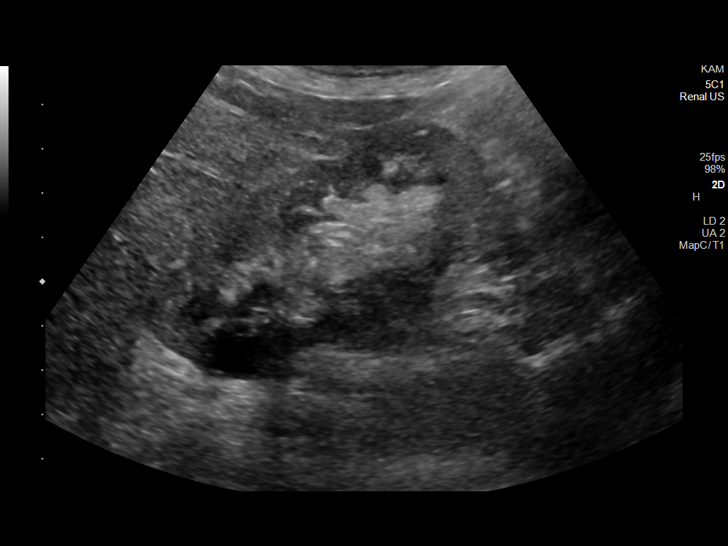
[im 9/107]
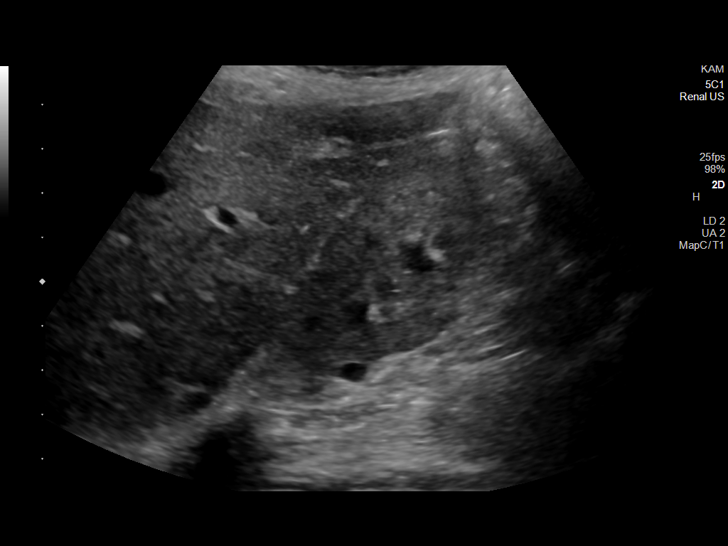
[im 18/107]
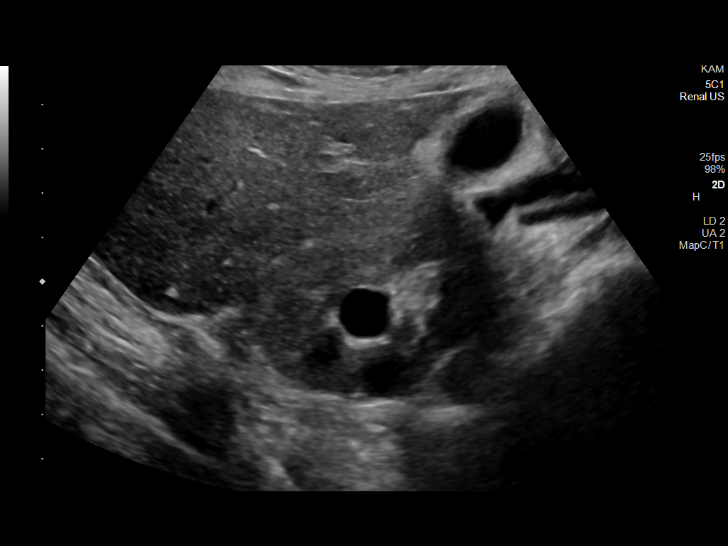
[im 27/107]
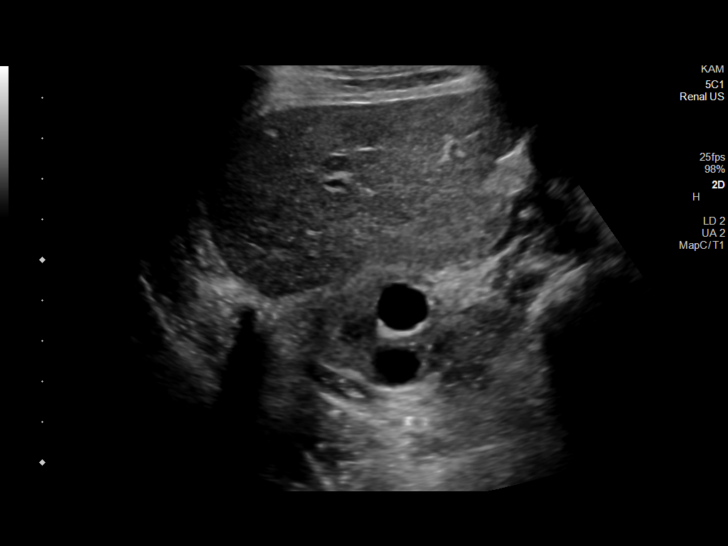
[im 36/107]
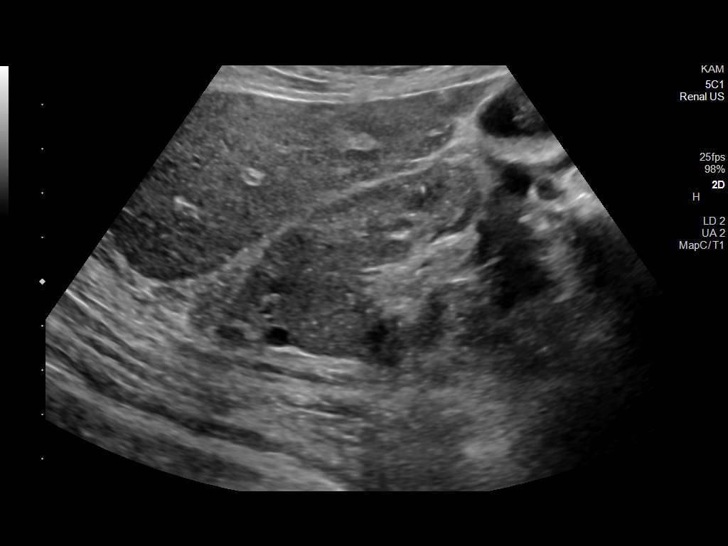
[im 45/107]
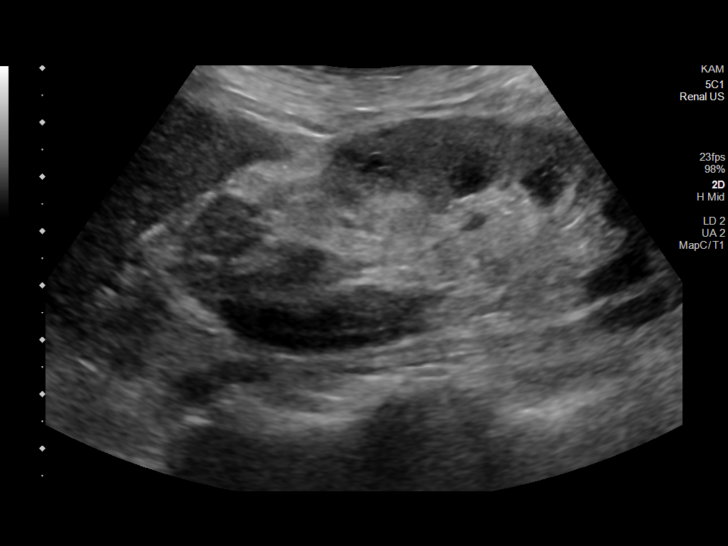
[im 54/107]
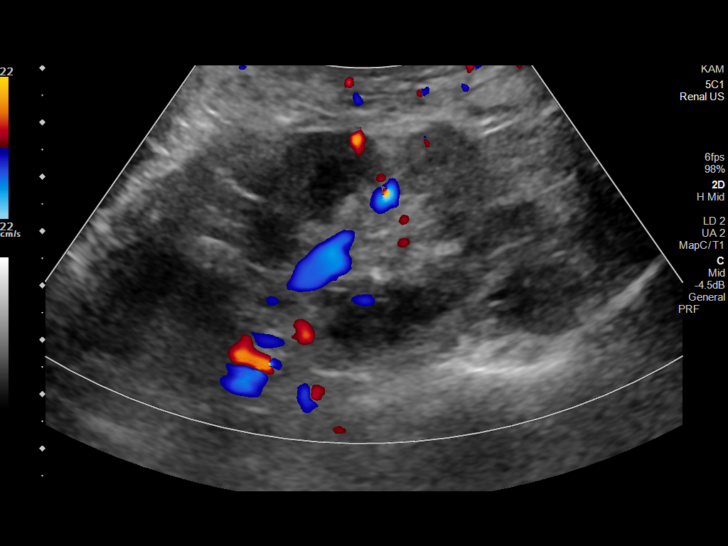
[im 62/107]
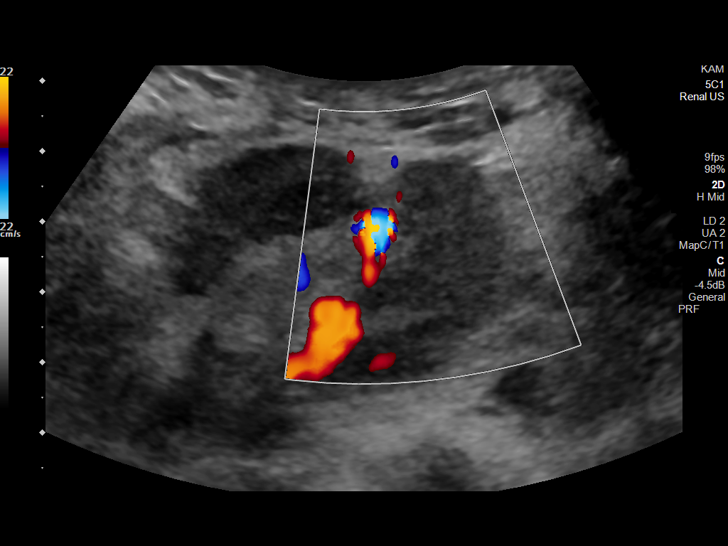
[im 71/107]
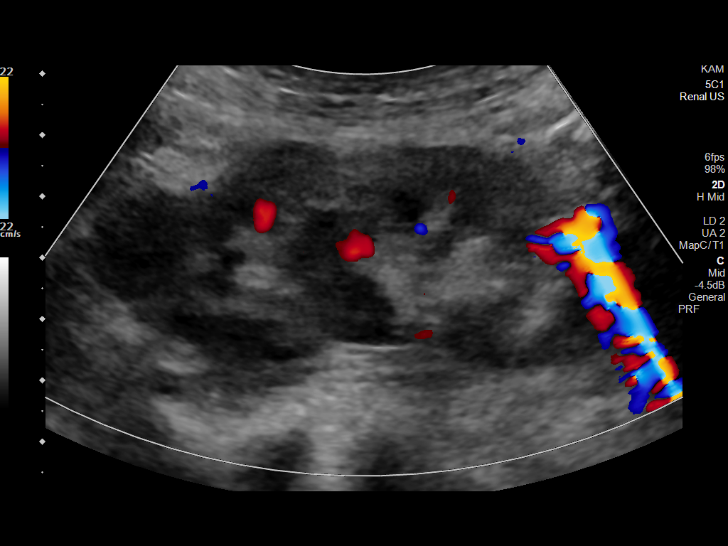
[im 80/107]
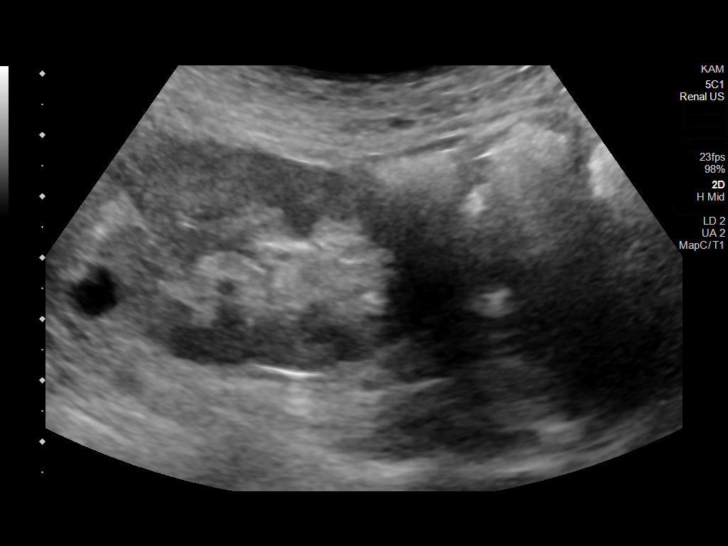
[im 89/107]
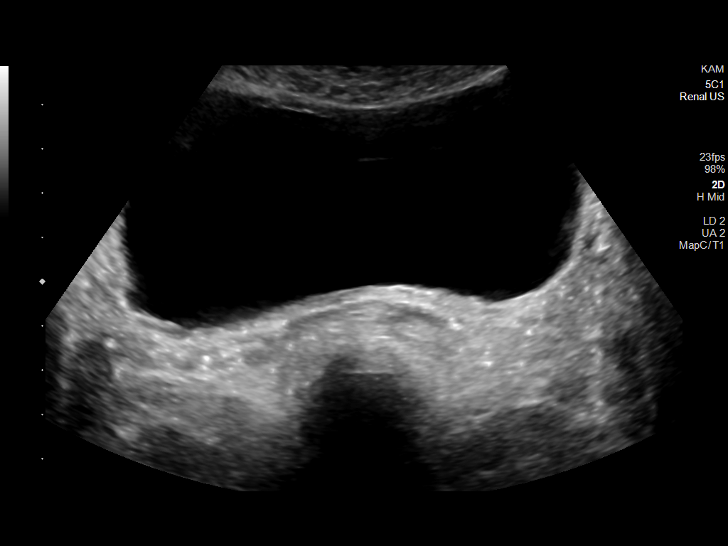
[im 98/107]
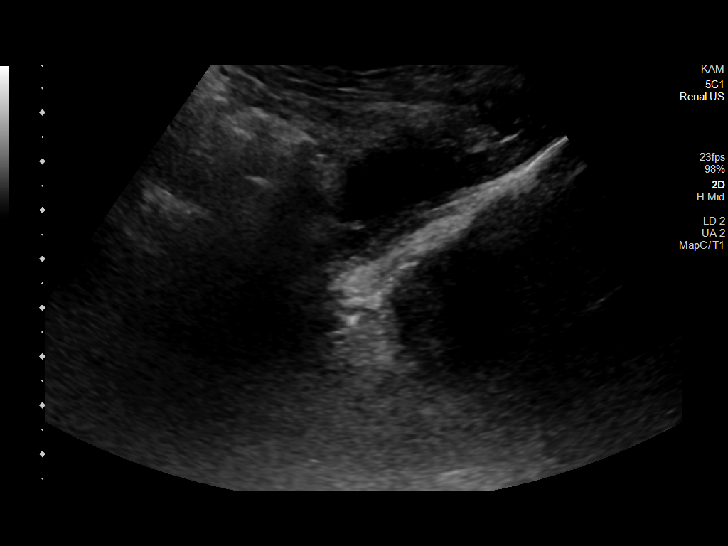
[im 107/107]
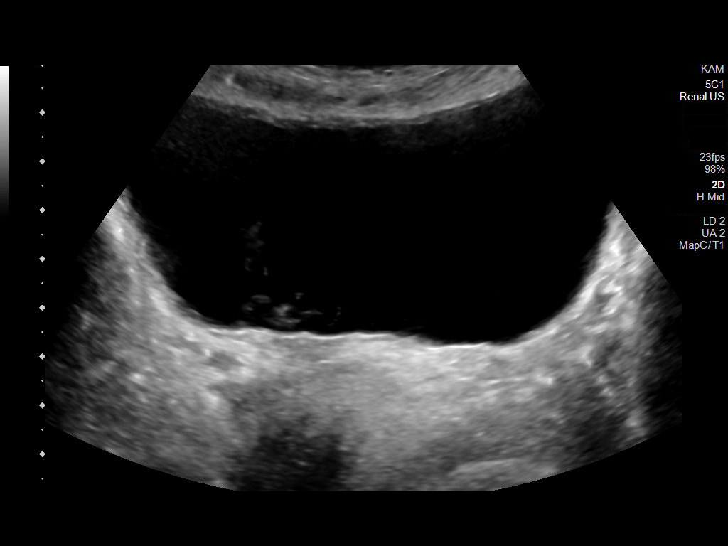

[13 of 25 positions shown; findings below may reference images not displayed]

FINDINGS: Right Kidney:

Renal measurements: 8.2 x 4 x 6.7 cm = volume: 112 mL. No
hydronephrosis. Increased renal parenchymal echogenicity. Small cyst
in the upper pole measures 1.5 x 1.1 x 1.2 cm appears simple. There
is an adjacent 1.4 x 1.1 x 1.3 cm cyst in the upper pole that may be
minimally complex. Additional smaller cysts are seen throughout the
right kidney that are too small to characterize.

Left Kidney:

Renal measurements: 9.8 x 3.7 x 4.2 cm = volume: 81 mL. Small simple
and minimally complex cysts, largest include a 1.2 cm cyst in the
mid kidney, 1.2 cm cyst in the lower kidney. Nonobstructing stone in
the lower pole. There is no definite solid lesion by ultrasound
corresponding to that questioned on CT.

Bladder:

Distended. Multiple bladder diverticula and trabeculated wall.
Layering intraluminal debris.

Other:

None.
IMPRESSION: 1. No sonographic evidence of solid lesion corresponding to the area
of clinical concern on CT.
2. Simple and mildly complex renal cysts within both kidneys. These
are small in size measuring 1.5 cm greatest dimension on the right
and 1.2 cm greatest dimension on the left. There are no suspicious
sonographic characteristics of any of the cysts.
3. Left renal stone.
4. Bladder diverticula and trabeculation, query neurogenic bladder
or chronic bladder outlet obstruction. This was also seen on prior
ultrasound.

## 2020-12-24 IMAGING — CT CT CHEST W/ CM
3 of 5 series · 15 of 36 positions shown, 17 images · IV contrast (APPLIED)
Comparison: [DATE]

CLINICAL DATA: 30lb weight loss, dysphagia, early satiety

EXAM:
CT CHEST WITH CONTRAST
TECHNIQUE: Multidetector CT imaging of the chest was performed during
intravenous contrast administration.
CONTRAST:  50mL OMNIPAQUE IOHEXOL 350 MG/ML SOLN

[Series 3: routine chest with · axial · 0.60mm/px · z∈[-525,-259]mm · 8 of 172 slices shown, 10 images]
[im 20/172  mediastinal]
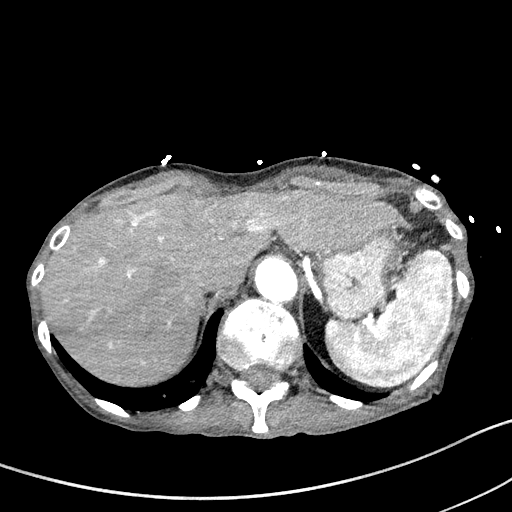
[im 20/172  lung]
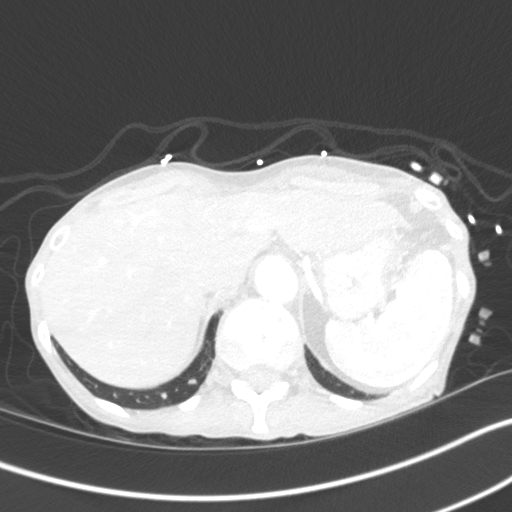
[im 39/172  lung]
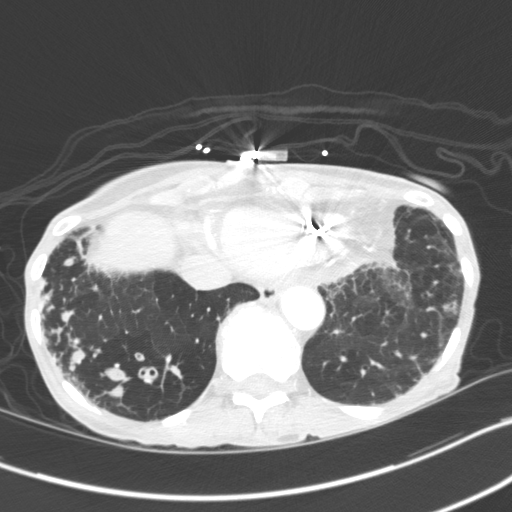
[im 58/172  lung]
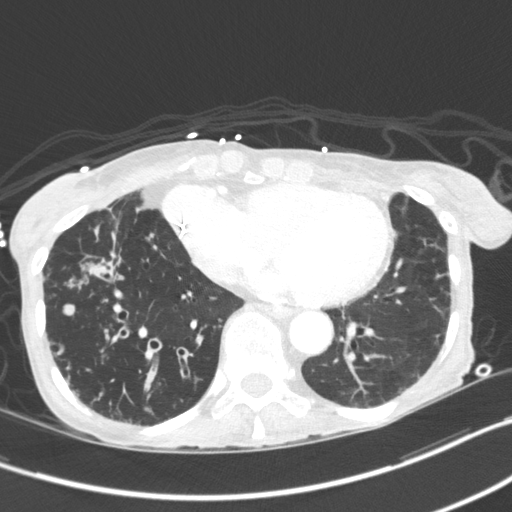
[im 77/172  lung]
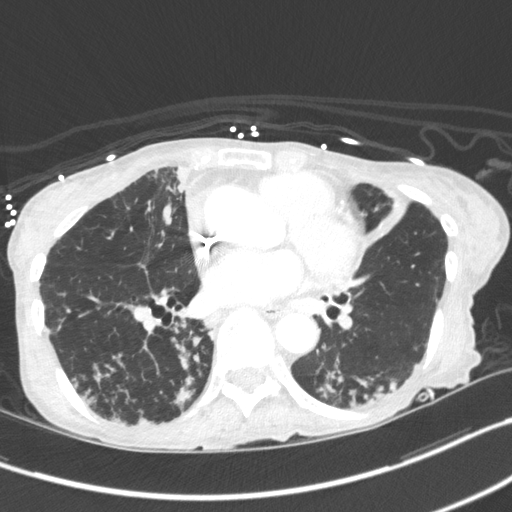
[im 96/172  mediastinal]
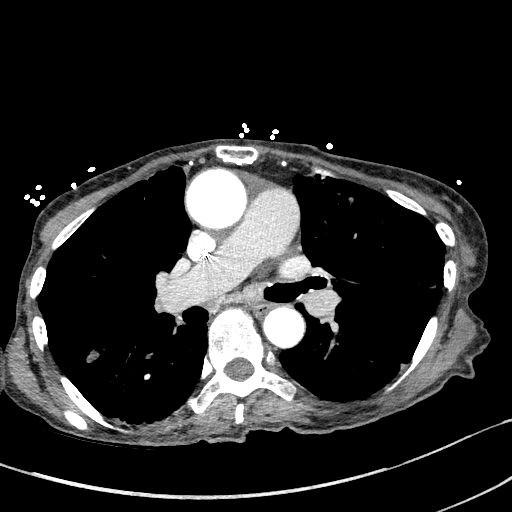
[im 96/172  lung]
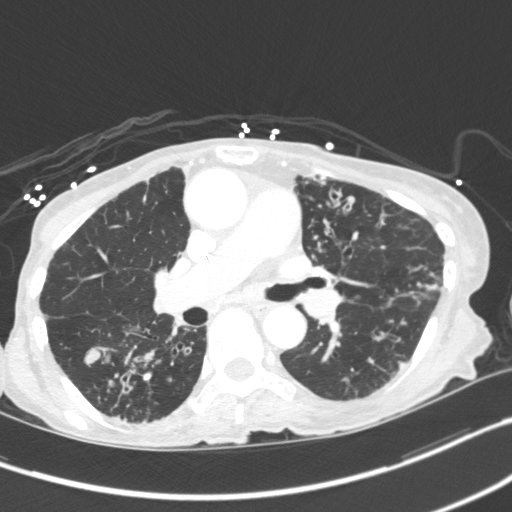
[im 115/172  lung]
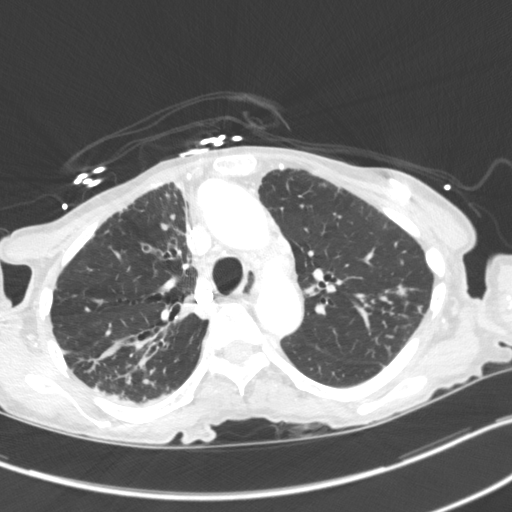
[im 134/172  lung]
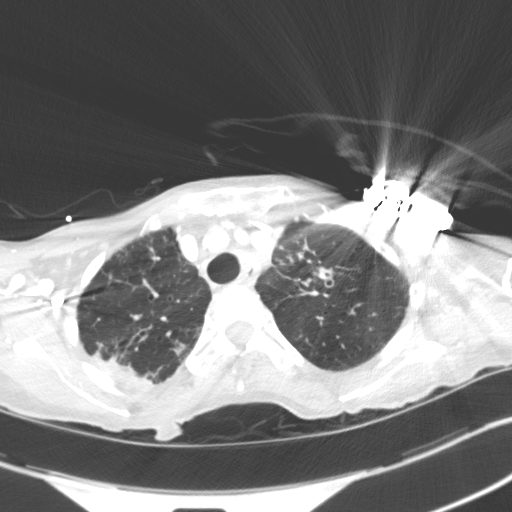
[im 153/172  lung]
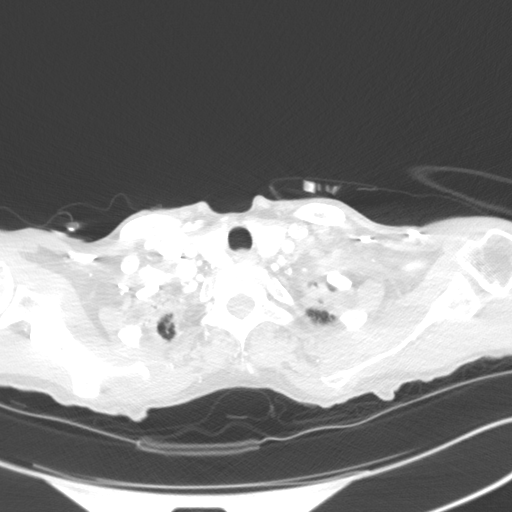

[Series 5: lungs · axial · 0.60mm/px · z∈[-521,-393]mm · 4 of 172 slices shown]
[im 22/172  lung]
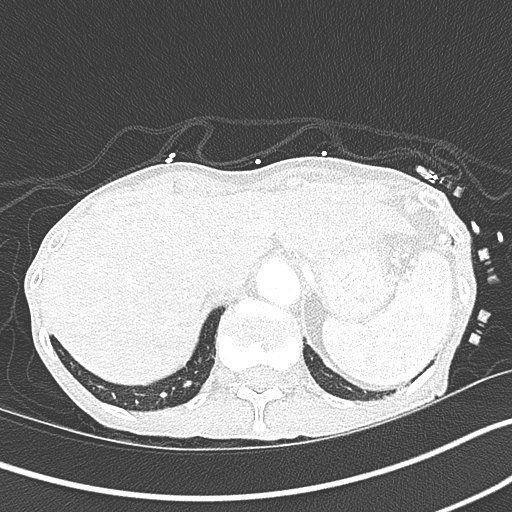
[im 43/172  lung]
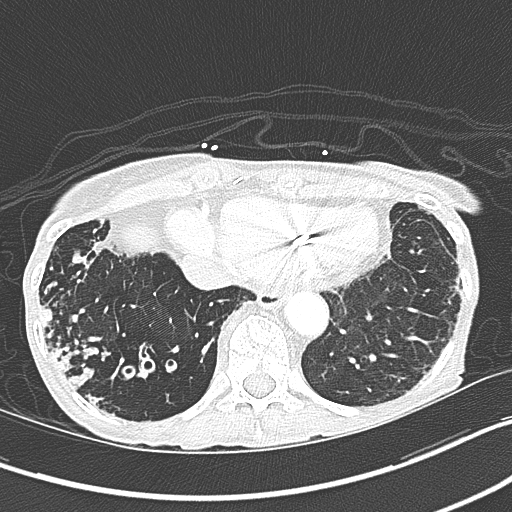
[im 65/172  lung]
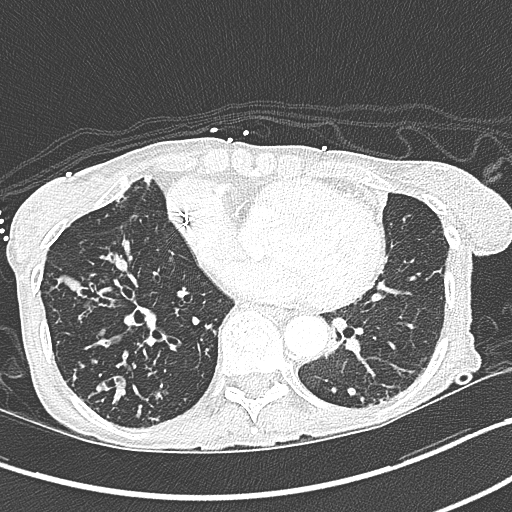
[im 86/172  lung]
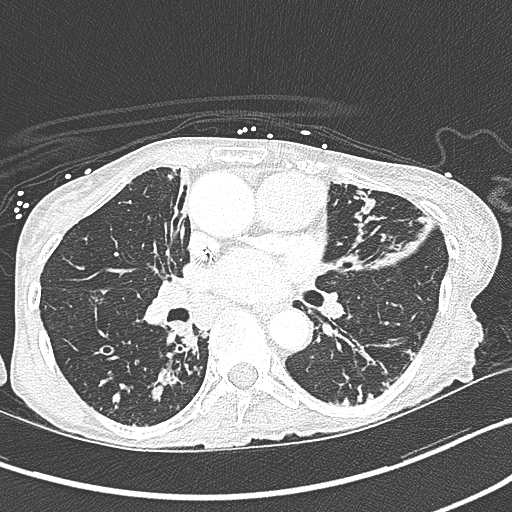

[Series 6: coronal (person_name) · coronal · 0.58mm/px · 3 of 98 slices shown]
[im 20/98  lung]
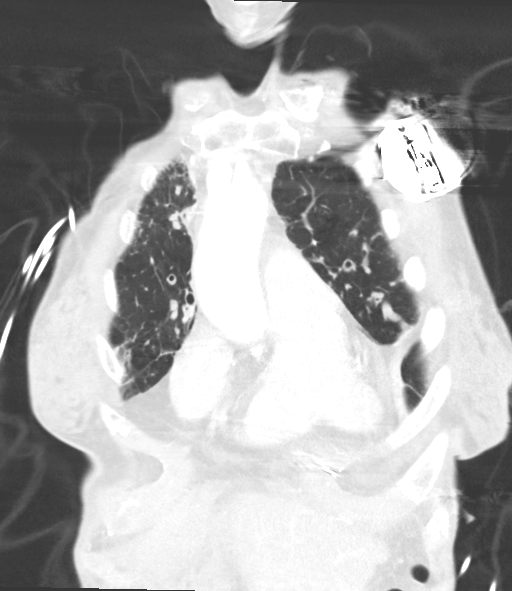
[im 39/98  lung]
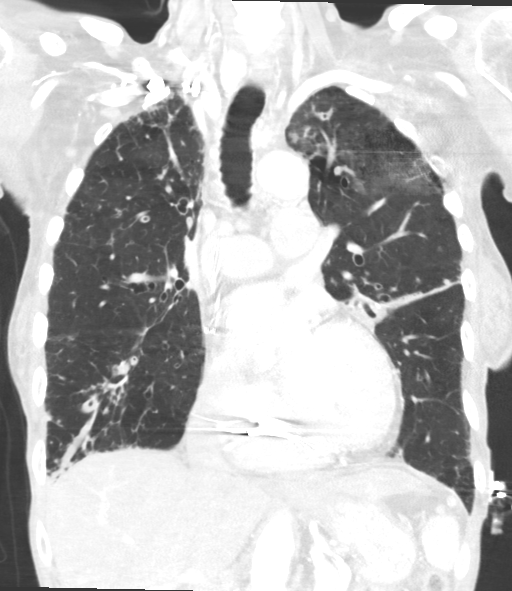
[im 59/98  lung]
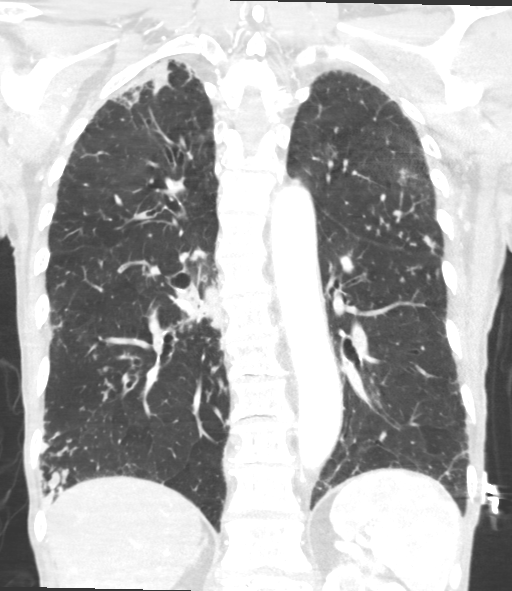

[15 of 36 positions shown; findings below may reference images not displayed]

FINDINGS: Cardiovascular: Moderate multi-vessel coronary artery calcification.
Global cardiac size within normal limits. Left subclavian pacemaker
is in place with single lead within the right ventricle at the apex.
Small pericardial effusion with fluid seen within the superior
pericardial recess. The central pulmonary arteries are enlarged in
keeping with changes of pulmonary arterial hypertension. The
thoracic aorta is of normal caliber. Mild atherosclerotic
calcification within the thoracic aorta.

Mediastinum/Nodes: The visualized thyroid is unremarkable. A single
pathologically enlarged subcarinal lymph node measures 2.2 x 2.5 cm
at axial image # 85/3, stable since prior examination when measured
in similar fashion. No additional pathologic thoracic adenopathy.
The esophagus is unremarkable.

Lungs/Pleura: Severe bilateral cylindrical bronchiectasis is again
identified and appears grossly stable in distribution and severity
when compared to prior examination. There has, however, developed
superimposed progressive airway impaction, best appreciated within
the right lung base, peribronchial nodularity, and bronchial wall
thickening in keeping with probable superimposed infection and
associated airway inflammation. This may reflect progression of of
an underlying chronic infectious process such as mycobacterial or
atypical fungal infection, or reflect a superimposed acute
infection. Chronic scarring in volume loss within the lingula and
peripheral consolidation within the posterior segment of the right
upper lobe appears stable. No pneumothorax or pleural effusion. No
central obstructing mass lesion.

Upper Abdomen: No acute abnormality.

Musculoskeletal: No acute bone abnormality. No lytic or blastic bone
lesion identified.
IMPRESSION: Moderate multi-vessel coronary artery calcification.

Morphologic changes in keeping with pulmonary arterial hypertension,
stable since prior examination.

Extensive asymmetric bilateral bronchiectasis with areas of
parenchymal scarring and peripheral consolidation likely reflecting
the sequela of a chronic infectious process such as mycobacterial or
atypical fungal infection. Progressive multifocal airway impaction,
peribronchial nodularity, and bronchial wall thickening in keeping
with either progression of the patient's indolent infectious process
or superimposed acute infection.

Aortic Atherosclerosis ([AS]-[AS]).

## 2020-12-24 IMAGING — CT CT ABD-PELV W/ CM
2 of 5 series · 15 of 46 positions shown, 17 images · IV contrast (omnipaque)
Comparison: None.

CLINICAL DATA: Epigastric abdominal pain. Generalized weakness,
dizziness, anorexia, unintentional 30 pound weight loss.

EXAM:
CT ABDOMEN AND PELVIS WITH CONTRAST
TECHNIQUE: Multidetector CT imaging of the abdomen and pelvis was performed
using the standard protocol following bolus administration of
intravenous contrast.
CONTRAST:  50mL OMNIPAQUE IOHEXOL 350 MG/ML SOLN

[Series 2: abd (person_name) · axial · 0.72mm/px · z∈[+954,+1314]mm · 12 of 82 slices shown, 14 images]
[im 5/82  soft-tissue]
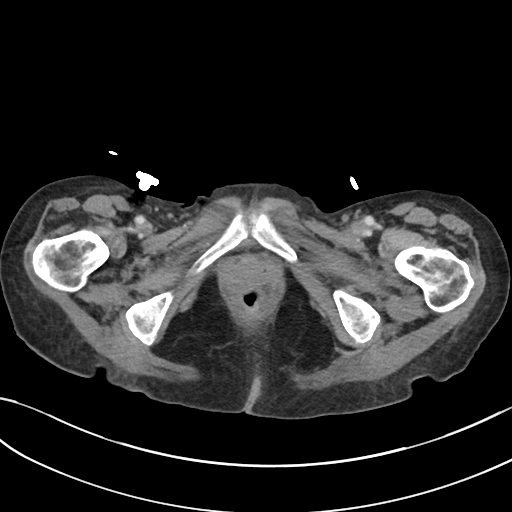
[im 5/82  bone]
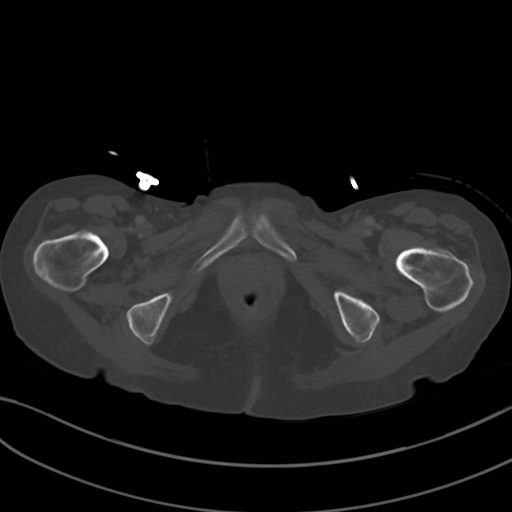
[im 13/82  soft-tissue]
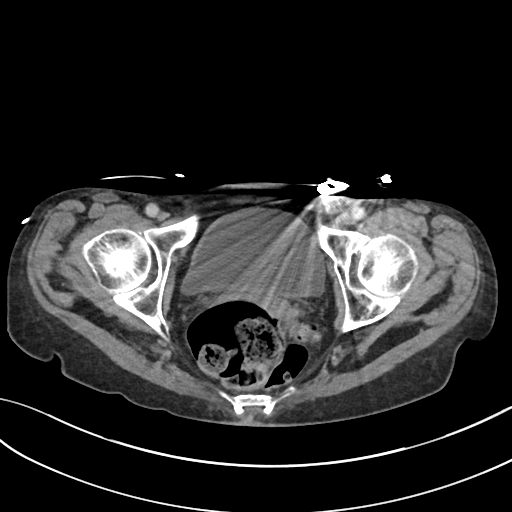
[im 18/82  soft-tissue]
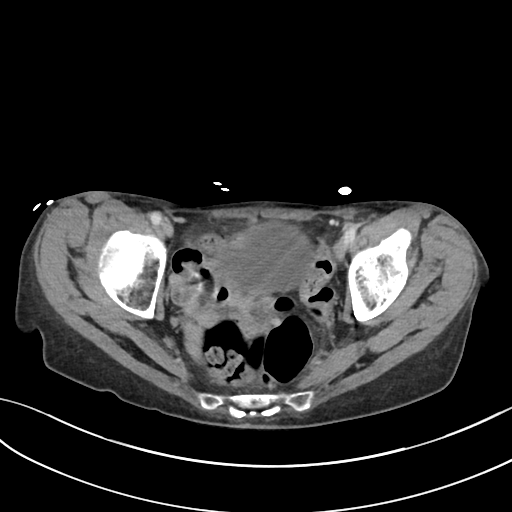
[im 26/82  soft-tissue]
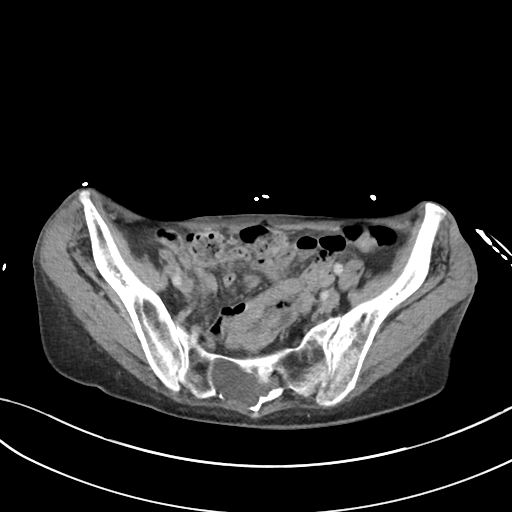
[im 30/82  soft-tissue]
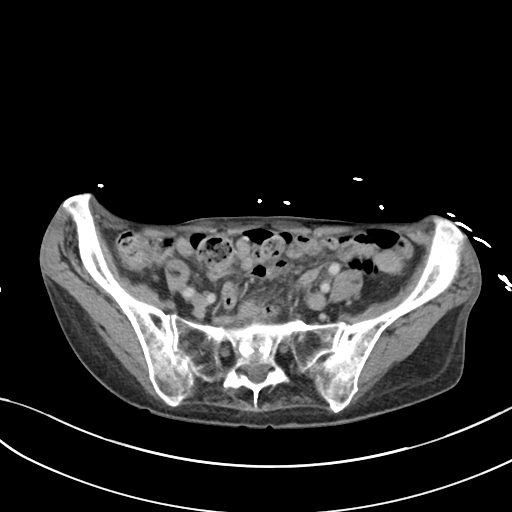
[im 39/82  soft-tissue]
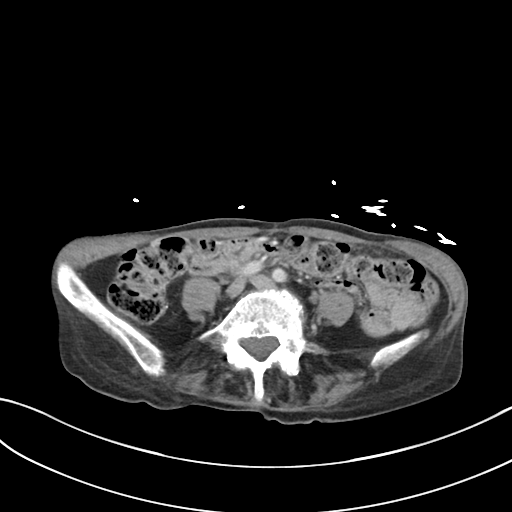
[im 43/82  soft-tissue]
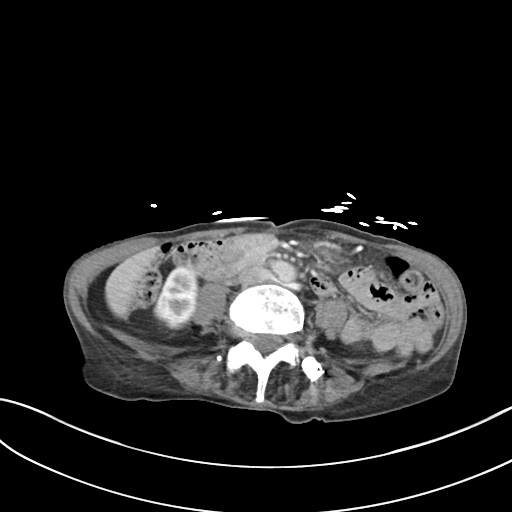
[im 52/82  soft-tissue]
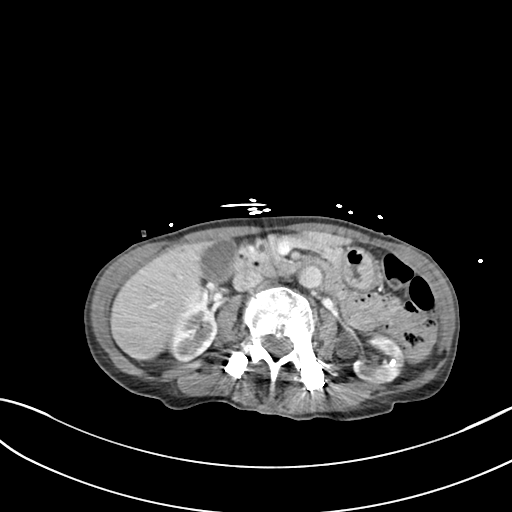
[im 56/82  soft-tissue]
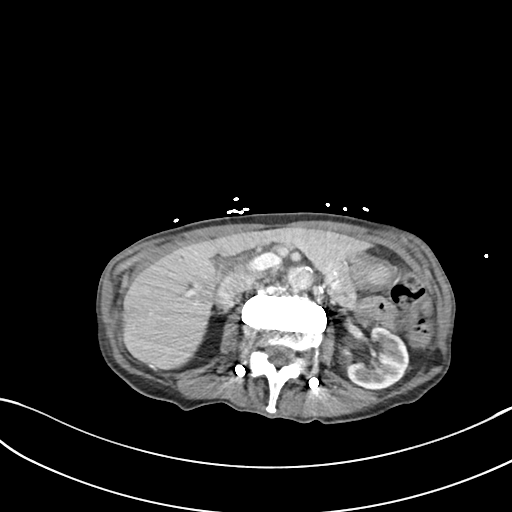
[im 56/82  bone]
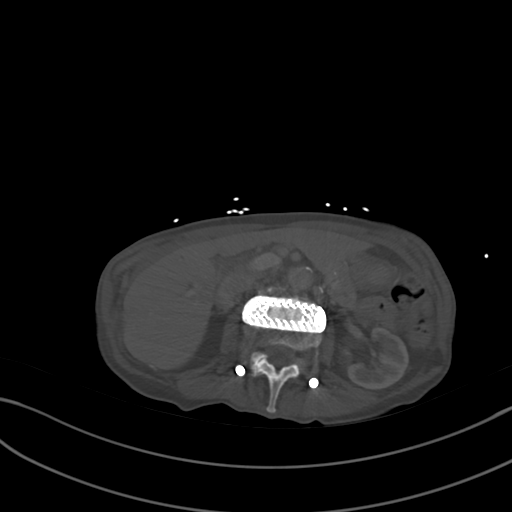
[im 64/82  soft-tissue]
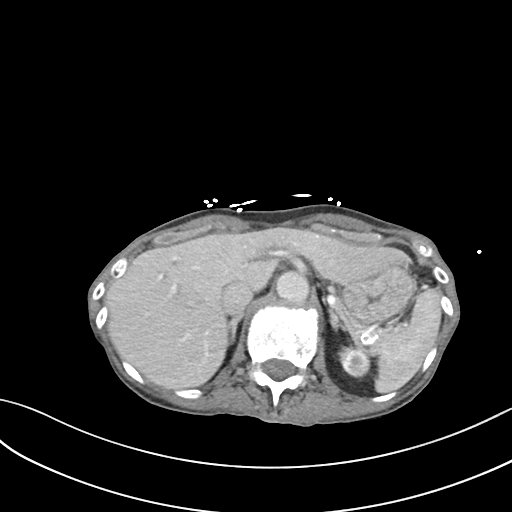
[im 69/82  soft-tissue]
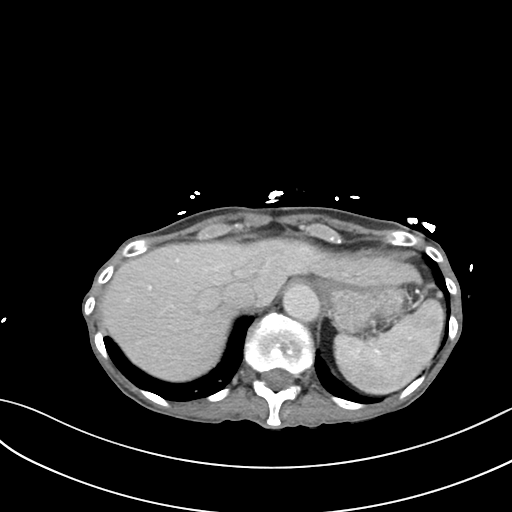
[im 77/82  soft-tissue]
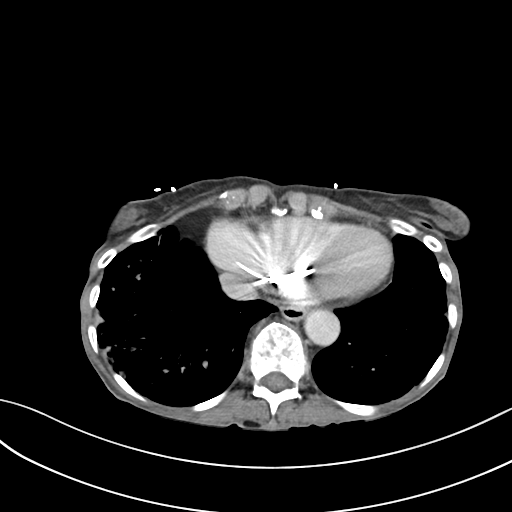

[Series 5: coronal · coronal · 0.73mm/px · 3 of 68 slices shown]
[im 23/68  soft-tissue]
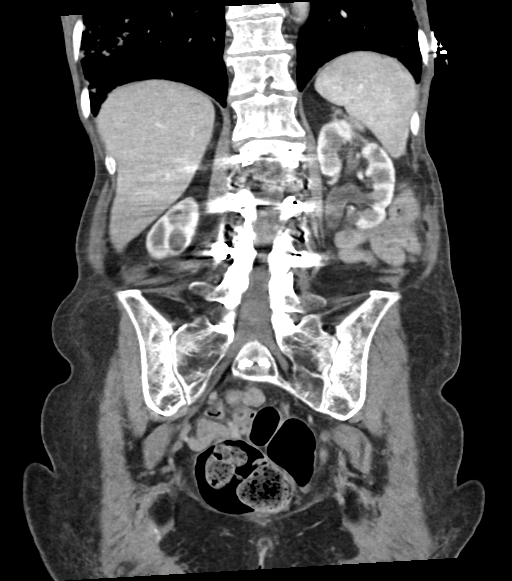
[im 30/68  soft-tissue]
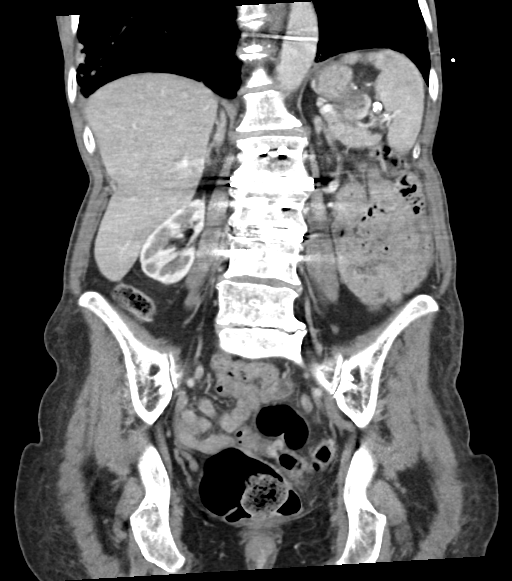
[im 38/68  soft-tissue]
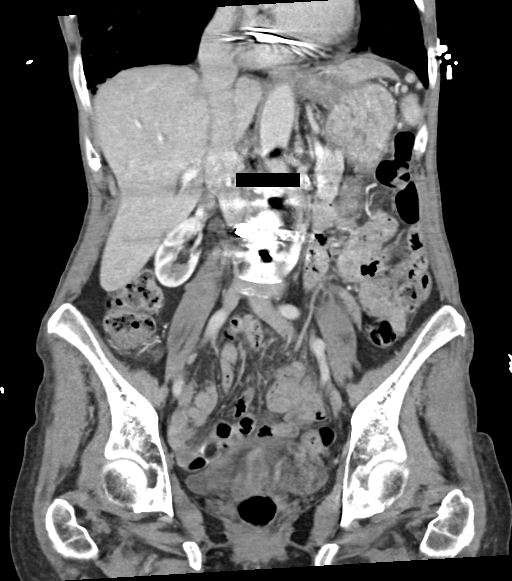

[15 of 46 positions shown; findings below may reference images not displayed]

FINDINGS: Lower chest: There is cylindrical bronchiectasis identified within
the visualized right lung base with superimposed airway impaction
which appears progressive since prior CT examination of the chest of
[DATE] which may represent superimposed acute infection. No
pleural effusion. Pacemaker leads are seen within the right
ventricle toward the apex. Cardiac size is within normal limits.

Hepatobiliary: No focal liver abnormality is seen. No gallstones,
gallbladder wall thickening, or biliary dilatation.

Pancreas: Unremarkable

Spleen: Unremarkable

Adrenals/Urinary Tract: The adrenal glands are unremarkable. The
kidneys are normal in size and position. Simple cortical cyst noted
within the interpolar region of the right kidney. 11 mm hyperdense
exophytic lesion noted from the interpolar region of the left kidney
is technically indeterminate on this examination, possibly
representing a hyperdense renal cyst or a cystic renal mass. 7 mm
nonobstructing calculus noted within the lower pole of the left
kidney with associated cortical atrophy. No hydronephrosis. The
bladder is unremarkable.

Stomach/Bowel: The stomach, small bowel, and large bowel are
unremarkable. No evidence of obstruction or focal inflammation.
Appendix normal. No free intraperitoneal gas or fluid.

Vascular/Lymphatic: Mild aortoiliac atherosclerotic calcification.
No aortic aneurysm. Rim calcified structure within the splenic hilum
adjacent to the tail the pancreas likely represents a thrombosed
splenic artery aneurysm. Retroaortic left renal vein. No pathologic
adenopathy within the abdomen and pelvis.

Reproductive: Status post hysterectomy. No adnexal masses.

Other: No abdominal wall hernia.  Rectum unremarkable.

Musculoskeletal: L2-L4 lumbar fusion with instrumentation has been
performed. Degenerative changes are seen within the lumbar spine. A
water attenuation circumscribed expansile lytic lesion is seen
within the right sacrum involving the S2 neural foramen most in
keeping with a perineural cyst measuring 2.3 x 4.7 cm. No other
focal lytic or blastic bone lesions identified.
IMPRESSION: Progressive cylindrical bronchiectasis within the visualized right
lung base with superimposed airway impaction which may reflect
changes of acute infection.

11 mm indeterminate low-attenuation lesion within the interpolar
region of the left kidney, possibly representing a hyperdense renal
cyst or cystic renal neoplasm. This could be better assessed with
dedicated renal sonography, if indicated.

Mild left nonobstructing nephrolithiasis.

4.7 cm expansile lytic lesion within the right S2 neural foramen
most in keeping with a perineural cyst.

Aortic Atherosclerosis ([OK]-[OK]).

## 2020-12-24 MED ORDER — IOHEXOL 350 MG/ML SOLN
100.0000 mL | Freq: Once | INTRAVENOUS | Status: AC | PRN
  Administered 2020-12-24: 50 mL via INTRAVENOUS

## 2020-12-24 MED ORDER — IOHEXOL 350 MG/ML SOLN
50.0000 mL | Freq: Once | INTRAVENOUS | Status: AC | PRN
  Administered 2020-12-24: 50 mL via INTRAVENOUS

## 2020-12-24 MED ORDER — LACTATED RINGERS IV BOLUS
250.0000 mL | Freq: Once | INTRAVENOUS | Status: AC
  Administered 2020-12-24: 250 mL via INTRAVENOUS

## 2020-12-24 MED ORDER — NADOLOL 20 MG PO TABS
10.0000 mg | ORAL_TABLET | Freq: Every day | ORAL | Status: DC
  Filled 2020-12-24: qty 1

## 2020-12-24 MED ORDER — LACTATED RINGERS IV BOLUS
500.0000 mL | Freq: Once | INTRAVENOUS | Status: AC
  Administered 2020-12-24: 500 mL via INTRAVENOUS

## 2020-12-24 NOTE — Discharge Instructions (Signed)
You were evaluated in the Emergency Department and after careful evaluation, we did not find any emergent condition requiring admission or further testing in the hospital.  Your exam/testing today was overall reassuring.  We are not able to identify an etiology of your significant weight loss.  Of note, your thyroid function testing is pending.  If significantly abnormal, you may need further adjustment of your Synthroid.  Recommend following up with your PCP later this week for repeat evaluation.  Please return to the Emergency Department if you experience any worsening of your condition.  Thank you for allowing [place] to be a part of your care.

## 2020-12-24 NOTE — ED Notes (Signed)
Assisted patient to the restroom using a walker

## 2020-12-24 NOTE — ED Notes (Signed)
Pt verbalizes understanding of discharge instructions. Opportunity for questioning and answers were provided. Armand removed by staff, pt discharged from ED to home.  

## 2020-12-24 NOTE — ED Notes (Signed)
Pt states can not void at this time 

## 2020-12-24 NOTE — ED Notes (Signed)
Lab called to add T4 to previous TSH sent.

## 2020-12-24 NOTE — ED Provider Notes (Signed)
[facility] DEPT Provider Note   CSN: [id number] Arrival date & time: 12/24/20  1607     History Chief Complaint  Patient presents with   Weakness    Stacy Sanchez is a 75 y.o. female.   Weakness Associated symptoms: no abdominal pain, no arthralgias, no chest pain, no cough, no dysuria, no fever, no seizures, no shortness of breath and no vomiting    75 year old female with a history of CHF, hypertension, MAC infection, hypothyroidism, anxiety, depression presenting to the emergency department with a 30 pound weight loss, chronic fatigue, anorexia with decreased p.o. intake.   The patient states that she has not been eating as much over the past few weeks.  She has been struggling with increased anxiety and her PCP recently started her on Lexapro which she just started taking in the past week.  She still gets somewhat short of breath with exertion due to her MAC.  She denies any chest pain or palpitations.  She does endorse some epigastric abdominal discomfort when she eats and early satiety  She also endorses symptoms of reflux.  She is on 25 MCG of levothyroxine for hypothyroidism.  Her thyroid function was last checked on 11/09/2020 with an elevated TSH to 4.59, free T4 normal at 1.68.      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   Chronic respiratory failure with hypoxia ([place]) 12/07/2018   Bronchiectasis ([place]) 11/27/2018   Pseudomonas aeruginosa  infection 11/27/2018   Thrush 11/27/2018   CAD (coronary artery disease) 11/27/2018   CKD (chronic kidney disease), stage III ([place]) 11/27/2018   Essential hypertension 11/27/2018   Generalized anxiety disorder 11/27/2018   Peripheral neuropathy 11/27/2018   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    Past Surgical History:  Procedure Laterality Date   ABDOMINAL HYSTERECTOMY     BRONCHOSCOPY  2001   CARDIAC SURGERY     COLONOSCOPY  2008   CYST REMOVAL TRUNK  1965   Left axilla; benign   DILATION AND CURETTAGE OF UTERUS  1975   HAND SURGERY Right 1983   ICD IMPLANT Left 02/01/2020   PATENT DUCTUS ARTERIOUS REPAIR  1952   RIGHT HEART CATH N/A 03/23/2020   Procedure: RIGHT HEART CATH;  Surgeon: Jolaine Artist, MD;  Location: [facility];  Service: Cardiovascular;  Laterality: N/A;   TONSILLECTOMY  1956   VARICOSE VEIN SURGERY  1972     OB History     Gravida      Para      Term      Preterm      AB      Living  3      SAB      IAB      Ectopic      Multiple      Live Births              Family History  Problem Relation Age of Onset  Thyroid disease Daughter     Social History   Tobacco Use   Smoking status: Never    Passive exposure: Never   Smokeless tobacco: Never  Vaping Use   Vaping Use: Never used  Substance Use Topics   Alcohol use: No   Drug use: No    Home Medications Prior to Admission medications   Medication Sig Start Date End Date Taking? Authorizing Provider  aspirin EC 81 MG tablet Take 81 mg by mouth daily.   Yes [provider]  buPROPion (WELLBUTRIN XL) 300 MG 24 hr tablet Take 300 mg by mouth daily.  11/01/19  Yes [provider]  escitalopram (LEXAPRO) 10 MG tablet Take 10 mg by mouth daily.   Yes [provider]  fluticasone (FLONASE) 50 MCG/ACT nasal spray Place 1 spray into both nostrils daily as needed for allergies or rhinitis.   Yes [provider]  gabapentin  (NEURONTIN) 300 MG capsule Take 300 mg by mouth 2 (two) times daily.   Yes [provider]  levothyroxine (SYNTHROID, LEVOTHROID) 50 MCG tablet Take 50-100 mcg by mouth See admin instructions. Take 50 mg  tablet daily on Monday through Friday  Take 100 mcg  Saturday and Sunday   Yes [provider]  Multiple Vitamin (MULTIVITAMIN WITH MINERALS) TABS tablet Take 1 tablet by mouth daily.   Yes [provider]  nadolol (CORGARD) 20 MG tablet Take 0.5 tablets (10 mg total) by mouth at bedtime. 08/30/20 08/30/21 Yes Bensimhon, Shaune Pascal, MD  polyethylene glycol powder (GLYCOLAX/MIRALAX) 17 GM/SCOOP powder Take 17 g by mouth daily as needed for moderate constipation.  07/13/09  Yes [provider]  rosuvastatin (CRESTOR) 10 MG tablet Take 10 mg by mouth daily. 10/19/18  Yes [provider]  acetaminophen (TYLENOL) 650 MG CR tablet Take 650 mg by mouth every 8 (eight) hours as needed for pain.     [provider]  ipratropium-albuterol (DUONEB) 0.5-2.5 (3) MG/3ML SOLN Take 3 mLs by nebulization 3 (three) times daily as needed. 11/09/19   Chesley Mires, MD  Nebulizers (COMPRESSOR/NEBULIZER) MISC 1 Product by Does not apply route once for 1 dose. 12/01/18 07/13/28  Swayze, Ava, DO  OXYGEN Inhale 2-3 L into the lungs See admin instructions. As needed and at bedtime use 3 liters with CPAP    [provider]  sodium chloride HYPERTONIC 3 % nebulizer solution Take 4 mLs by nebulization 2 (two) times daily as needed for other. 11/09/19   Chesley Mires, MD  traZODone (DESYREL) 50 MG tablet Take 50 mg by mouth at bedtime.    [provider]    Allergies    Losartan, Amikacin, Cefepime, Ethambutol, Levaquin [levofloxacin], Parafon forte dsc [chlorzoxazone], Rifabutin, Tequin [gatifloxacin], Zyvox [linezolid], and Lisinopril  Review of Systems   Review of Systems  Constitutional:  Positive for appetite change, fatigue and unexpected weight change.  Negative for chills and fever.  HENT:  Negative for ear pain and sore throat.   Eyes:  Negative for pain and visual disturbance.  Respiratory:  Negative for cough and shortness of breath.   Cardiovascular:  Negative for chest pain and palpitations.  Gastrointestinal:  Negative for abdominal pain and vomiting.  Genitourinary:  Negative for dysuria and hematuria.  Musculoskeletal:  Negative for arthralgias and back pain.  Skin:  Negative for color change and rash.  Neurological:  Positive for weakness. Negative for seizures and syncope.  All other systems reviewed and are negative.  Physical Exam Updated Vital Signs  BP (!) 190/104 (BP Location: Right Arm)   Pulse 60   Temp 97.8 F (36.6 C)   Resp 17   Wt 46.1 kg   SpO2 97%   BMI 15.93 kg/m   Physical Exam Vitals and nursing note reviewed.  Constitutional:      General: She is not in acute distress.    Appearance: She is well-developed.     Comments: Malnourished appearing, underweight 75 year old female  HENT:     Head: Normocephalic and atraumatic.  Eyes:     Conjunctiva/sclera: Conjunctivae normal.  Cardiovascular:     Rate and Rhythm: Normal rate and regular rhythm.     Heart sounds: No murmur heard. Pulmonary:     Effort: Pulmonary effort is normal. No respiratory distress.     Breath sounds: Normal breath sounds.  Abdominal:     Palpations: Abdomen is soft.     Tenderness: There is no abdominal tenderness.  Musculoskeletal:     Cervical back: Neck supple.  Skin:    General: Skin is warm and dry.  Neurological:     Mental Status: She is alert.  Psychiatric:        Attention and Perception: Attention and perception normal.        Mood and Affect: Mood is anxious and depressed.        Speech: Speech normal.        Behavior: Behavior normal. Behavior is cooperative.        Thought Content: Thought content normal. Thought content does not include homicidal or suicidal ideation. Thought content does not include  suicidal plan.        Cognition and Memory: Cognition normal.    ED Results / Procedures / Treatments   Labs (all labs ordered are listed, but only abnormal results are displayed) Labs Reviewed  BASIC METABOLIC PANEL - Abnormal; Notable for the following components:      Result Value   Chloride 94 (*)    CO2 35 (*)    BUN 28 (*)    Creatinine, Ser 1.40 (*)    GFR, Estimated 39 (*)    All other components within normal limits  URINALYSIS, ROUTINE W REFLEX MICROSCOPIC - Abnormal; Notable for the following components:   Protein, ur 30 (*)    Leukocytes,Ua TRACE (*)    Bacteria, UA RARE (*)    All other components within normal limits  BRAIN NATRIURETIC PEPTIDE - Abnormal; Notable for the following components:   B Natriuretic Peptide 263.9 (*)    All other components within normal limits  TSH - Abnormal; Notable for the following components:   TSH 5.291 (*)    All other components within normal limits  T4, FREE - Abnormal; Notable for the following components:   Free T4 1.21 (*)    All other components within normal limits  CBC  MAGNESIUM  LIPASE, BLOOD  TROPONIN I (HIGH SENSITIVITY)  TROPONIN I (HIGH SENSITIVITY)    EKG EKG Interpretation  Date/Time:  Sunday December 24 2020 16:31:10 EDT Ventricular Rate:  70 PR Interval:  140 QRS Duration: 80 QT Interval:  404 QTC Calculation: 436 R Axis:   -78 Text Interpretation: Normal sinus rhythm Biatrial enlargement Left axis deviation Pulmonary disease pattern Left ventricular hypertrophy ( Cornell product ) Nonspecific T wave abnormality Abnormal ECG No significant change since last tracing Confirmed by Regan Lemming ([id number]) on 12/24/2020 4:34:59 PM  Radiology CT Soft Tissue Neck W Contrast  Result Date: 12/24/2020 CLINICAL DATA:  Initial evaluation for  dysphagia, weight loss. EXAM: CT NECK WITH CONTRAST TECHNIQUE: Multidetector CT imaging of the neck was performed using the standard protocol following the bolus administration  of intravenous contrast. CONTRAST:  29m OMNIPAQUE IOHEXOL 350 MG/ML SOLN COMPARISON:  For concomitant CT of the chest. FINDINGS: Pharynx and larynx: Evaluation of the oral cavity somewhat limited by streak artifact from dental amalgam. No visible mass lesion or other abnormality. No acute abnormality seen about the dentition. No base of tongue lesion. Oropharynx and nasopharynx within normal limits. No retropharyngeal collection or swelling. Epiglottis within normal limits. Vallecula clear. Small amount of layering secretions noted within the hypopharynx. Remainder of the hypopharynx and supraglottic larynx otherwise unremarkable. Glottis grossly symmetric and within normal limits. Subglottic airway patent clear. Salivary glands: Salivary glands including the parotid and submandibular glands are within normal limits. Thyroid: 8 mm nodule present at the inferior left thyroid lobe, of doubtful significance given size and patient age, no follow-up imaging recommended (ref: J Am Coll Radiol. 2015 Feb;12(2): 143-50). Lymph nodes: No enlarged or pathologic adenopathy seen within the neck. Vascular: Normal intravascular enhancement seen throughout the neck. Limited intracranial: Unremarkable. Visualized orbits: Prior bilateral ocular lens replacement. Otherwise unremarkable. Mastoids and visualized paranasal sinuses: Minimal mucosal thickening noted about the ethmoidal air cells. Visualized paranasal sinuses are otherwise clear. Visualized mastoid air cells and middle ear cavities are well pneumatized and free of fluid. Skeleton: No visible acute osseous finding. No discrete or worrisome osseous lesions. Moderate spondylosis noted at C5-6 and C6-7 without high-grade spinal stenosis. Upper chest: Extensive pleuroparenchymal scarring with bronchiectatic changes noted within the visualized lungs. Left-sided pacemaker/AICD in place. Findings better evaluated on concomitant CT of the chest. Other: None. IMPRESSION: 1. Negative  CT of the neck. No acute inflammatory changes or other abnormality identified. No mass lesion or adenopathy. 2. Moderate spondylosis at C5-6 and C6-7 without high-grade spinal stenosis. 3. Extensive pleuroparenchymal scarring with bronchiectatic changes within the visualized lungs, better evaluated on concomitant CT of the chest. Electronically Signed   By: BJeannine BogaM.D.   On: 12/24/2020 23:38   CT Chest W Contrast  Result Date: 12/24/2020 CLINICAL DATA:  30lb weight loss, dysphagia, early satiety EXAM: CT CHEST WITH CONTRAST TECHNIQUE: Multidetector CT imaging of the chest was performed during intravenous contrast administration. CONTRAST:  546mOMNIPAQUE IOHEXOL 350 MG/ML SOLN COMPARISON:  06/21/2019 FINDINGS: Cardiovascular: Moderate multi-vessel coronary artery calcification. Global cardiac size within normal limits. Left subclavian pacemaker is in place with single lead within the right ventricle at the apex. Small pericardial effusion with fluid seen within the superior pericardial recess. The central pulmonary arteries are enlarged in keeping with changes of pulmonary arterial hypertension. The thoracic aorta is of normal caliber. Mild atherosclerotic calcification within the thoracic aorta. Mediastinum/Nodes: The visualized thyroid is unremarkable. A single pathologically enlarged subcarinal lymph node measures 2.2 x 2.5 cm at axial image # 85/3, stable since prior examination when measured in similar fashion. No additional pathologic thoracic adenopathy. The esophagus is unremarkable. Lungs/Pleura: Severe bilateral cylindrical bronchiectasis is again identified and appears grossly stable in distribution and severity when compared to prior examination. There has, however, developed superimposed progressive airway impaction, best appreciated within the right lung base, peribronchial nodularity, and bronchial wall thickening in keeping with probable superimposed infection and associated airway  inflammation. This may reflect progression of of an underlying chronic infectious process such as mycobacterial or atypical fungal infection, or reflect a superimposed acute infection. Chronic scarring in volume loss within the lingula and peripheral consolidation within  the posterior segment of the right upper lobe appears stable. No pneumothorax or pleural effusion. No central obstructing mass lesion. Upper Abdomen: No acute abnormality. Musculoskeletal: No acute bone abnormality. No lytic or blastic bone lesion identified. IMPRESSION: Moderate multi-vessel coronary artery calcification. Morphologic changes in keeping with pulmonary arterial hypertension, stable since prior examination. Extensive asymmetric bilateral bronchiectasis with areas of parenchymal scarring and peripheral consolidation likely reflecting the sequela of a chronic infectious process such as mycobacterial or atypical fungal infection. Progressive multifocal airway impaction, peribronchial nodularity, and bronchial wall thickening in keeping with either progression of the patient's indolent infectious process or superimposed acute infection. Aortic Atherosclerosis (ICD10-I70.0). Electronically Signed   By: Fidela Salisbury M.D.   On: 12/24/2020 23:10   CT ABDOMEN PELVIS W CONTRAST  Result Date: 12/24/2020 CLINICAL DATA:  Epigastric abdominal pain. Generalized weakness, dizziness, anorexia, unintentional 30 pound weight loss. EXAM: CT ABDOMEN AND PELVIS WITH CONTRAST TECHNIQUE: Multidetector CT imaging of the abdomen and pelvis was performed using the standard protocol following bolus administration of intravenous contrast. CONTRAST:  61m OMNIPAQUE IOHEXOL 350 MG/ML SOLN COMPARISON:  None. FINDINGS: Lower chest: There is cylindrical bronchiectasis identified within the visualized right lung base with superimposed airway impaction which appears progressive since prior CT examination of the chest of 06/21/2019 which may represent superimposed  acute infection. No pleural effusion. Pacemaker leads are seen within the right ventricle toward the apex. Cardiac size is within normal limits. Hepatobiliary: No focal liver abnormality is seen. No gallstones, gallbladder wall thickening, or biliary dilatation. Pancreas: Unremarkable Spleen: Unremarkable Adrenals/Urinary Tract: The adrenal glands are unremarkable. The kidneys are normal in size and position. Simple cortical cyst noted within the interpolar region of the right kidney. 11 mm hyperdense exophytic lesion noted from the interpolar region of the left kidney is technically indeterminate on this examination, possibly representing a hyperdense renal cyst or a cystic renal mass. 7 mm nonobstructing calculus noted within the lower pole of the left kidney with associated cortical atrophy. No hydronephrosis. The bladder is unremarkable. Stomach/Bowel: The stomach, small bowel, and large bowel are unremarkable. No evidence of obstruction or focal inflammation. Appendix normal. No free intraperitoneal gas or fluid. Vascular/Lymphatic: Mild aortoiliac atherosclerotic calcification. No aortic aneurysm. 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 No pathologic adenopathy within the abdomen and pelvis. Reproductive: Status post hysterectomy. No adnexal masses. Other: No abdominal wall hernia.  Rectum unremarkable. Musculoskeletal: L2-L4 lumbar fusion with instrumentation has been performed. Degenerative changes are seen within the lumbar spine. A water attenuation circumscribed expansile lytic lesion is seen within the right sacrum involving the S2 neural foramen most in keeping with a perineural cyst measuring 2.3 x 4.7 cm. No other focal lytic or blastic bone lesions identified. IMPRESSION: Progressive cylindrical bronchiectasis within the visualized right lung base with superimposed airway  impaction which may reflect changes of acute infection. 11 mm indeterminate low-attenuation lesion within the interpolar region of the left kidney, possibly representing a hyperdense renal cyst or cystic renal neoplasm. This could be better assessed with dedicated renal sonography, if indicated. Mild left nonobstructing nephrolithiasis. 4.7 cm expansile lytic lesion within the right S2 neural foramen most in keeping with a perineural cyst. Aortic Atherosclerosis (ICD10-I70.0). Electronically Signed   By: AFidela SalisburyM.D.   On: 12/24/2020 19:17   [place]  Result Date: 12/24/2020 CLINICAL DATA:  Ultrasound characterization of left renal lesion on CT. EXAM: RENAL /  URINARY TRACT ULTRASOUND COMPLETE COMPARISON:  Contrast-enhanced CT earlier today. Renal ultrasound 07/14/2019 FINDINGS: Right Kidney: Renal measurements: 8.2 x 4 x 6.7 cm = volume: 112 mL. No hydronephrosis. Increased renal parenchymal echogenicity. Small cyst in the upper pole measures 1.5 x 1.1 x 1.2 cm appears simple. There is an adjacent 1.4 x 1.1 x 1.3 cm cyst in the upper pole that may be minimally complex. Additional smaller cysts are seen throughout the right kidney that are too small to characterize. Left Kidney: Renal measurements: 9.8 x 3.7 x 4.2 cm = volume: 81 mL. Small simple and minimally complex cysts, largest include a 1.2 cm cyst in the mid kidney, 1.2 cm cyst in the lower kidney. Nonobstructing stone in the lower pole. There is no definite solid lesion by ultrasound corresponding to that questioned on CT. Bladder: Distended. Multiple bladder diverticula and trabeculated wall. Layering intraluminal debris. Other: None. IMPRESSION: 1. No sonographic evidence of solid lesion corresponding to the area of clinical concern on CT. 2. Simple and mildly complex renal cysts within both kidneys. These are small in size measuring 1.5 cm greatest dimension on the right and 1.2 cm greatest dimension on the left. There are no suspicious  sonographic characteristics of any of the cysts. 3. Left renal stone. 4. Bladder diverticula and trabeculation, query neurogenic bladder or chronic bladder outlet obstruction. This was also seen on prior ultrasound. Electronically Signed   By: Keith Rake M.D.   On: 12/24/2020 20:49   DG Chest Portable 1 View  Result Date: 12/24/2020 CLINICAL DATA:  Weakness and dizziness EXAM: PORTABLE CHEST 1 VIEW COMPARISON:  Chest radiograph 02/04/2019 FINDINGS: Single lead AICD device overlies left hemithorax there is tortuosity of the thoracic aorta. Similar bilateral coarse interstitial opacities. No new superimposed area of consolidation identified. No pleural effusion or pneumothorax. IMPRESSION: Redemonstrated scattered coarse interstitial opacities within the right greater than left lungs. Electronically Signed   By: Lovey Newcomer M.D.   On: 12/24/2020 18:17    Procedures Procedures   Medications Ordered in ED Medications  lactated ringers bolus 500 mL (0 mLs Intravenous Stopped 12/24/20 1934)  iohexol (OMNIPAQUE) 350 MG/ML injection 100 mL (50 mLs Intravenous Contrast Given 12/24/20 1828)  iohexol (OMNIPAQUE) 350 MG/ML injection 50 mL (50 mLs Intravenous Contrast Given 12/24/20 2220)  lactated ringers bolus 250 mL (0 mLs Intravenous Stopped 12/24/20 2327)    ED Course  I have reviewed the triage vital signs and the nursing notes.  Pertinent labs & imaging results that were available during my care of the patient were reviewed by me and considered in my medical decision making (see chart for details).    MDM Rules/Calculators/A&P                           75 year old female with a history of CHF, hypertension, MAC infection, hypothyroidism, anxiety, depression presenting to the emergency department with a 30 pound weight loss, chronic fatigue, anorexia with decreased p.o. intake.   The patient states that she has not been eating as much over the past few weeks.  She has been struggling with  increased anxiety and her PCP recently started her on Lexapro which she just started taking in the past week.  She still gets somewhat short of breath with exertion due to her MAC.  She denies any chest pain or palpitations.  She does endorse some epigastric abdominal discomfort when she eats and early satiety  She also endorses symptoms of reflux.  She is on 88 MCG of levothyroxine for hypothyroidism.  Her thyroid function was last checked on 11/09/2020 with an elevated TSH to 4.59, free T4 normal at 1.68.  On arrival, the patient was afebrile, hemodynamically stable, mildly hypertensive BP 178/112.  She has asymptomatic hypertension at this time.  She denies any headaches, vision changes, chest pain, shortness of breath, abdominal pain, nausea or vomiting.  She has had a 30 pound weight loss over the past several months with associated fatigue.  She endorses mild vague epigastric discomfort and early satiety after eating.  She endorses symptoms concerning for possible depression.  She was just started on Lexapro for anxiety within the past week.    Differential diagnosis: Malignancy (pancreatic, gastric, esophageal, other), hypothyroidism, electrolyte abnormality, depression, GERD, anemia, peptic ulcer disease.  EKG was performed which revealed no ischemic changes.  CT of the neck, chest abdomen pelvis was performed which revealed no evidence of clear malignancy.  Possible mass and cysts noted in the kidney, recommended renal ultrasound which was ultimately performed and consistent with renal cysts without significant solid mass lesion.  Troponins normal, urinalysis without clear evidence of urinary tract infection, lipase normal, TSH elevated and free T4 mildly elevated.  BNP nonspecifically elevated to 264, magnesium normal, BMP with creatinine which appears to be improved from baseline at 1.4 with a BUN of 28.  Patient has no leukocytosis or anemia.  Symptoms are potentially most consistent with  depression.  The patient is not suicidal, homicidal.  Has no audiovisual hallucinations.  She is just been started on Lexapro in conjunction with her PCP.  She is also recently moved into an independent living facility.  She states that she has decreased appetite but is still able to perform all of her own ADLs.  I do not have a good etiology of her weight loss at this time.  The patient has chronic issues with dysphagia that has been going on "for years" but has not been acutely worsening at this time.  I did recommend outpatient referral to gastroenterology for consideration for endoscopy given the patient's early satiety, epigastric discomfort and anorexia.  Additionally recommended follow-up with her PCP to discuss symptoms of depression and follow-up regarding her symptoms as she just started Lexapro.  I do not see an indication for acute psychiatric consultation or inpatient admission at this time.  Recommended close follow-up with PCP later this week to discuss work-up and next steps outpatient.  Final Clinical Impression(s) / ED Diagnoses Final diagnoses:  Weight loss  Abdominal discomfort, epigastric  Fatigue, unspecified type  Dysphagia, unspecified type    Rx / DC Orders ED Discharge Orders          Ordered    Ambulatory referral to Gastroenterology        12/25/20 0108             Regan Lemming, MD 12/25/20 1359

## 2020-12-24 NOTE — ED Notes (Signed)
Pt given snacks and water while waiting for CT. Daughter at the bedside.

## 2020-12-24 NOTE — ED Triage Notes (Signed)
Patient reports to the ER for weakness, dizziness, lack of appetite, and loss of weight

## 2020-12-25 LAB — T4, FREE: Free T4: 1.21 ng/dL — ABNORMAL HIGH (ref 0.61–1.12)

## 2020-12-25 LAB — TSH: TSH: 5.291 u[IU]/mL — ABNORMAL HIGH (ref 0.350–4.500)

## 2021-01-01 NOTE — Progress Notes (Addendum)
ADVANCED HF CLINIC NOTE  PCP: Glenna Durand, MD HF Cardiologist: Dr. 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 She has been referred by Marlin Canary, PA at [facility] for further evaluation of new-onset systolic HF.     She was admitted to [facility] in 10/21 with syncopal spell and diagnosed with new nonischemic cardiomyopathy. Echocardiogram showed EF 20-25%, normal RV, LVIDD 4.6 cm. (Previous echocardiogram 2019 showed EF 50-55%, and either a torn chordae or redundant chordae by report.) Cardiac cath revealed no CAD. EKG showed sinus rhythm with T wave abnormalities with narrow QRS. CT and MRI of head were unremarkable. EP was consulted and ICD was placed.   Recently struggling with prominent sinus tachycardia and progressive HF symptoms.   Underwent RHC on 03/23/20 which showed well compensated hemodynamics with very mild PAH  Zio patch 12/21 showed primarily sinus rhythm - avg HR of 101 bpm. +NSVT Rare PACs and PVCs  Echo  05/01/20 EF 45-50% RV ok.  She was added on to clinic schedule today given recent issues w/ hypertension. Home BP readings have been running higher in the 140s/150s. She reports feeling bad when this happens, often "jittery" and dizzy. Resting HR has also been elevated at times, as high as 90s-low 100s. She has also struggled recently w/ increased anxiety/depression due to recent life change. Given her medical issues and struggles w ADLs, if was felt best to move her into an ALF closer to her children. She has struggled emotionally w/ the transition. Also w/ decrease in appetite and subsequent 30 lb wt loss over the last several months. Her PCP recently started her on Lexapro. Her family also had growing concerns with her amount of weakness and sent her to the ED  for evaluation on 8/9. W/u was unremarkable. There was also concern for possible malignancy given 30 lb wt loss but CTs of neck, check, A/P were unremarkable. EDP also felt that symptoms were most likely due to depression. They did recommend outpatient referral to gastroenterology for consideration for endoscopy given the patient's early satiety, epigastric discomfort and anorexia.  I personally reviewed Labs from ED. Not suggestive of low ouput FH. CO2 35, SCr 1.4 c/w baseline.   She presents to clinic today for evaluation. Here w/ her daughter. Hypoxic on arrival, O2 sats in the 80s after ambulating back from waiting room to exam room. CMA applied supplemental O2 w/ improvement in O2 sats to 97%. Pt does have supp O2 at home but did not bring w/ her. ReDS Clip normal at 25%. EKG shows NSR, HR 81 bpm, RAE and pulmonary disease pattern. BP today 139/79.   She also reports recent spontaneous limb jerks/ tremors.   Daughter also interested in establishing care w/ a geriatrician.   Pt also desires to transition electrophysiology care to [facility]. She has a Biotronik Device    RHC 12/21 RA = 2 RV = 33/2 PA = 35/10 (22) PCW = 6 Fick cardiac output/index = 4.1/2.6 PVR = 3.8 WU Ao sat = 94% PA sat = 61%, 60%    Current Outpatient Medications on File Prior to Encounter  Medication Sig Dispense Refill   acetaminophen (TYLENOL) 650 MG CR tablet Take 650 mg by mouth every 8 (eight) hours as needed for pain.      aspirin EC 81 MG  tablet Take 81 mg by mouth daily.     buPROPion (WELLBUTRIN XL) 300 MG 24 hr tablet Take 300 mg by mouth daily.      escitalopram (LEXAPRO) 10 MG tablet Take 10 mg by mouth daily.     fluticasone (FLONASE) 50 MCG/ACT nasal spray Place 1 spray into both nostrils daily as needed for allergies or rhinitis.     gabapentin (NEURONTIN) 300 MG capsule Take 300 mg by mouth 2 (two) times daily.     ipratropium-albuterol (DUONEB) 0.5-2.5 (3) MG/3ML SOLN Take 3 mLs by nebulization 3  (three) times daily as needed. 810 mL 1   levothyroxine (SYNTHROID, LEVOTHROID) 50 MCG tablet Take 50-100 mcg by mouth See admin instructions. Take 50 mg  tablet daily on Monday through Friday  Take 100 mcg  Saturday and Sunday     Multiple Vitamin (MULTIVITAMIN WITH MINERALS) TABS tablet Take 1 tablet by mouth daily.     nadolol (CORGARD) 20 MG tablet Take 0.5 tablets (10 mg total) by mouth at bedtime. 15 tablet 11   Nebulizers (COMPRESSOR/NEBULIZER) MISC 1 Product by Does not apply route once for 1 dose. 1 each 0   OXYGEN Inhale 2-3 L into the lungs See admin instructions. As needed and at bedtime use 3 liters with CPAP     polyethylene glycol powder (GLYCOLAX/MIRALAX) 17 GM/SCOOP powder Take 17 g by mouth daily as needed for moderate constipation.      rosuvastatin (CRESTOR) 10 MG tablet Take 10 mg by mouth daily.     sodium chloride HYPERTONIC 3 % nebulizer solution Take 4 mLs by nebulization 2 (two) times daily as needed for other. 720 mL 2   No current facility-administered medications on file prior to encounter.    Today's Vitals   01/02/21 0932  BP: 139/78  Pulse: 74  SpO2: 97%  Weight: 45.4 kg (100 lb)    Body mass index is 15.66 kg/m.  PHYSICAL EXAM: ReDs Clip 25%  General:  frail elderly female on supp O2. No respiratory difficulty HEENT: normal Neck: supple. no JVD. Carotids 2+ bilat; no bruits. No lymphadenopathy or thyromegaly appreciated. Cor: PMI nondisplaced. Regular rate & rhythm. No rubs, gallops or murmurs. Lungs: clear Abdomen: soft, nontender, nondistended. No hepatosplenomegaly. No bruits or masses. Good bowel sounds. Extremities: no cyanosis, clubbing, rash, thin extremities, no edema Neuro: alert & oriented x 3, cranial nerves grossly intact. moves all 4 extremities w/o difficulty. Affect pleasant.   Assessment: 1. 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 Echo at [facility] EF 20-25%  - etiology unclear  - cath at [facility] 10/21 no CAD.   - s/p Biotronik ICD in setting of syncopal episode with presumed VT - RHC 12/21 - well compensated - Echo 05/01/20 EF 45-50% RV ok. - NYHA II-III, confounded by chronic lung disease  - volume status stable. Euvolemic. ReDs Clip 25%  - Intolerant to Torprol (bad taste in her mouth). Now on nadolol 10 qhs. Will increase to 20 qhs for added BP control - Unable to tolerate losartan or lisinopril due to cough and low BP - Off digoxin with recovery of LV function  - Avoiding spironolactone due to borderline high K 4.7-5.0    2. H/o syncope - ICD placed 10/21 at [facility])  - Pt desires to transition electrophysiology care to [facility]. Will place referral    3. 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 -on chronic home O2  -  followed by Dr. Craige Cotta   4. Severe OSA - on CPAP.  - Follows with Dr. Craige Cotta   5. H/o patent ductus repair  6. Anxiety/ Depression  - on Lexapro - management per PCP  - will increase ? blocker per above   7. Hypertension  - Home BP readings running as high as 150s. - suspect anxiety is a contributing factor - increase nadolol to 20 mg qhs   6. Spontaneous limb jerks/ tremors - she is very concerned about this and I think may be contributing to her anxiety  - refer to neurology   Since move to [facility], she would like to establish care w/ a new PCP. Will refer to a geriatrician  F/u w/ APP in 4 weeks   Robbie Lis, PA-C  9:57 AM

## 2021-01-02 ENCOUNTER — Ambulatory Visit (HOSPITAL_COMMUNITY)
Admission: RE | Admit: 2021-01-02 | Discharge: 2021-01-02 | Disposition: A | Discharge status: Home / Self Care | Payer: Medicare HMO | Source: Ambulatory Visit | Attending: Cardiology | Admitting: Cardiology

## 2021-01-02 ENCOUNTER — Other Ambulatory Visit: Payer: Self-pay

## 2021-01-02 ENCOUNTER — Encounter (HOSPITAL_COMMUNITY): Payer: Self-pay

## 2021-01-02 VITALS — BP 139/78 | HR 74 | Wt 100.0 lb

## 2021-01-02 DIAGNOSIS — J9611 Chronic respiratory failure with hypoxia: Secondary | ICD-10-CM | POA: Insufficient documentation

## 2021-01-02 DIAGNOSIS — I110 Hypertensive heart disease with heart failure: Secondary | ICD-10-CM | POA: Diagnosis not present

## 2021-01-02 DIAGNOSIS — R630 Anorexia: Secondary | ICD-10-CM | POA: Diagnosis not present

## 2021-01-02 DIAGNOSIS — Z7982 Long term (current) use of aspirin: Secondary | ICD-10-CM | POA: Diagnosis not present

## 2021-01-02 DIAGNOSIS — I5020 Unspecified systolic (congestive) heart failure: Secondary | ICD-10-CM | POA: Diagnosis not present

## 2021-01-02 DIAGNOSIS — Z79899 Other long term (current) drug therapy: Secondary | ICD-10-CM | POA: Insufficient documentation

## 2021-01-02 DIAGNOSIS — J479 Bronchiectasis, uncomplicated: Secondary | ICD-10-CM | POA: Diagnosis not present

## 2021-01-02 DIAGNOSIS — F32A Depression, unspecified: Secondary | ICD-10-CM | POA: Diagnosis not present

## 2021-01-02 DIAGNOSIS — I5022 Chronic systolic (congestive) heart failure: Secondary | ICD-10-CM

## 2021-01-02 DIAGNOSIS — F419 Anxiety disorder, unspecified: Secondary | ICD-10-CM | POA: Diagnosis not present

## 2021-01-02 DIAGNOSIS — I428 Other cardiomyopathies: Secondary | ICD-10-CM | POA: Diagnosis not present

## 2021-01-02 DIAGNOSIS — R634 Abnormal weight loss: Secondary | ICD-10-CM | POA: Diagnosis not present

## 2021-01-02 DIAGNOSIS — R251 Tremor, unspecified: Secondary | ICD-10-CM | POA: Insufficient documentation

## 2021-01-02 DIAGNOSIS — I10 Essential (primary) hypertension: Secondary | ICD-10-CM

## 2021-01-02 DIAGNOSIS — R6881 Early satiety: Secondary | ICD-10-CM | POA: Insufficient documentation

## 2021-01-02 DIAGNOSIS — G255 Other chorea: Secondary | ICD-10-CM | POA: Diagnosis not present

## 2021-01-02 DIAGNOSIS — G4733 Obstructive sleep apnea (adult) (pediatric): Secondary | ICD-10-CM | POA: Insufficient documentation

## 2021-01-02 DIAGNOSIS — Z95810 Presence of automatic (implantable) cardiac defibrillator: Secondary | ICD-10-CM | POA: Insufficient documentation

## 2021-01-02 MED ORDER — NADOLOL 20 MG PO TABS: 20.0000 mg | ORAL_TABLET | Freq: Every day | ORAL | 3 refills | Status: DC

## 2021-01-02 MED ORDER — NADOLOL 20 MG PO TABS: 20.0000 mg | ORAL_TABLET | Freq: Every day | ORAL | 11 refills | Status: DC

## 2021-01-02 NOTE — Progress Notes (Signed)
ReDS Vest / Clip - 01/02/21 1000       ReDS Vest / Clip   Station Marker D    Ruler Value 22    ReDS Value Range Low volume    ReDS Actual Value 25

## 2021-01-02 NOTE — Patient Instructions (Signed)
INCREASE Nadol to 20 mg nightly at bedtime  You have been referred to [facility]- Electrophysiology -they will be in contact with an appointment  Your physician recommends that you schedule a follow-up appointment in: [place] at [facility] -they will be in contact with an appointment  Your physician recommends that you schedule a follow-up appointment in: [place] [facility] -they will be in contact with an appointment   Your physician recommends that you schedule a follow-up appointment in: 2-3 weeks  in the Advanced Practitioners (PA/NP) Clinic   Do the following things EVERYDAY: Weigh yourself in the morning before breakfast. Write it down and keep it in a log. Take your medicines as prescribed Eat low salt foods--Limit salt (sodium) to 2000 mg per day.  Stay as active as you can everyday Limit all fluids for the day to less than 2 liters

## 2021-01-08 ENCOUNTER — Encounter: Payer: Medicare HMO | Admitting: Cardiology

## 2021-01-22 NOTE — Progress Notes (Addendum)
ADVANCED HF CLINIC NOTE  PCP: Glenna Durand, MD HF Cardiologist: Dr. 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 History also notable for severe OSA, patent ductus repair at age 79. She has been referred by Stacy Canary, PA at [facility] for further evaluation of new-onset systolic HF.     She was admitted to [facility] in 10/21 with syncopal spell and diagnosed with new nonischemic cardiomyopathy. Echocardiogram showed EF 20-25%, normal RV, LVIDD 4.6 cm. (Previous echocardiogram 2019 showed EF 50-55%, and either a torn chordae or redundant chordae by report.) Cardiac cath revealed no CAD. EKG showed sinus rhythm with T wave abnormalities with narrow QRS. CT and MRI of head were unremarkable. EP was consulted and ICD was placed.   Recently struggling with prominent sinus tachycardia and progressive HF symptoms.   Underwent RHC on 03/23/20 which showed well compensated hemodynamics with very mild PAH  Zio patch 12/21 showed primarily sinus rhythm - avg HR of 101 bpm. +NSVT Rare PACs and PVCs  Echo  05/01/20 EF 45-50% RV ok.  Her family also had growing concerns with her amount of weakness and sent her to the ED for evaluation on 12/24/20. W/u was unremarkable. There was also concern for possible malignancy given 30 lb wt loss but CTs of neck, check, A/P were unremarkable. EDP also felt that symptoms were most likely due to depression. They did recommend outpatient referral to gastroenterology for consideration for endoscopy given the patient's early satiety, epigastric discomfort and anorexia. Labs from ED. Not suggestive of low ouput HF. CO2 35, SCr 1.4 c/w baseline.  Seen for acute visit for hypertension 01/03/21. Struggling with anxiety. Her beta blockade was increased and she was referred to EP to establish care as well as a geriatrician to establish  her primary care locally.   Today she returns for HF follow up with her daughter. She is living at [facility]. She uses a rolling walker to get around and is on oxygen. She is SOB with minimal activity. She has a very poor appetite and is losing weight. She is dizzy when standing or changing positions, and she fell this past week. Denies CP, edema, or PND/Orthopnea. Taking all medications. BP dropped with increase in nadolol, so she cut back to 10 mg q hs. Daughter has concerns about her anxiety/depression medications as well as appetite stimulant. Wears CPAP nightly.  Cardiac Studies: - Echo  05/01/20 EF 45-50% RV ok.   - RHC 12/21 RA = 2 RV = 33/2 PA = 35/10 (22) PCW = 6 Fick cardiac output/index = 4.1/2.6 PVR = 3.8 WU Ao sat = 94% PA sat = 61%, 60%  Current Outpatient Medications on File Prior to Encounter  Medication Sig Dispense Refill   acetaminophen (TYLENOL) 650 MG CR tablet Take 650 mg by mouth every 8 (eight) hours as needed for pain.      aspirin EC 81 MG tablet Take 81 mg by mouth daily.     buPROPion (WELLBUTRIN XL) 300 MG 24 hr tablet Take 300 mg by mouth daily.      escitalopram (LEXAPRO) 10 MG tablet Take 10 mg by mouth daily.     fluticasone (FLONASE) 50 MCG/ACT nasal spray Place 1 spray into both nostrils daily as needed for allergies or rhinitis.     gabapentin (NEURONTIN) 300 MG capsule Take 300 mg by mouth 2 (two)  times daily.     ipratropium-albuterol (DUONEB) 0.5-2.5 (3) MG/3ML SOLN Take 3 mLs by nebulization 3 (three) times daily as needed. 810 mL 1   levothyroxine (SYNTHROID, LEVOTHROID) 50 MCG tablet Take 50-100 mcg by mouth See admin instructions. Take 50 mg  tablet daily on Monday through Friday  Take 100 mcg  Saturday and Sunday     Multiple Vitamin (MULTIVITAMIN WITH MINERALS) TABS tablet Take 1 tablet by mouth daily.     nadolol (CORGARD) 20 MG tablet Take 1 tablet (20 mg total) by mouth at bedtime. (Patient taking differently: Take 20 mg by mouth at  bedtime. Patient takes 1/2 tablet daily.) 90 tablet 3   Nebulizers (COMPRESSOR/NEBULIZER) MISC 1 Product by Does not apply route once for 1 dose. 1 each 0   OXYGEN Inhale 2-3 L into the lungs See admin instructions. As needed and at bedtime use 3 liters with CPAP     polyethylene glycol powder (GLYCOLAX/MIRALAX) 17 GM/SCOOP powder Take 17 g by mouth daily as needed for moderate constipation.      rosuvastatin (CRESTOR) 10 MG tablet Take 10 mg by mouth daily.     sodium chloride HYPERTONIC 3 % nebulizer solution Take 4 mLs by nebulization 2 (two) times daily as needed for other. 720 mL 2   No current facility-administered medications on file prior to encounter.   BP (!) 160/100   Pulse 72   Wt 44.3 kg   SpO2 95%   BMI 15.29 kg/m   PHYSICAL EXAM: General:  NAD. No resp difficulty, frail on oxygen, mildly anxious. HEENT: temporal wasting Neck: Supple. No JVD. Carotids 2+ bilat; no bruits. No lymphadenopathy or thryomegaly appreciated. Cor: PMI nondisplaced. Regular rate & rhythm. No rubs, gallops or murmurs. Lungs: Clear Abdomen: Soft, nontender, nondistended. No hepatosplenomegaly. No bruits or masses. Good bowel sounds. Extremities: No cyanosis, clubbing, rash, edema; + skin tenting bilateral arms Neuro: Alert & oriented x 3, cranial nerves grossly intact. Moves all 4 extremities w/o difficulty. Affect pleasant.  ReDs: unable to register today, previous ReDs 25%  Assessment: 1. Orthostatic Hypotension - She is orthostatic today in clinic, sBP 160 sitting --> 118 standing. - I suspect she is dry. Liberalize salt and fluids. - Can trial midodrine 2.5 mg tid. - Given Rx for compression hose. - Will check labs. - We discussed fall precautions and changing positions slowly.  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 Echo at [facility] EF 20-25%  - etiology unclear  - cath at [facility] 10/21 no CAD.  - s/p Biotronik ICD in setting of syncopal episode with presumed VT -  RHC 12/21 - well compensated. - Echo 05/01/20 EF 45-50% RV ok. - She appears dry today, weight down + skin tenting. - Liberalize salt and fluids today. - Continue nadolol 10 qhs (Intolerant to Toprol w/ bad taste in her mouth).  - Unable to tolerate losartan or lisinopril due to cough and low BP. - Off digoxin with recovery of LV function. - Avoiding spironolactone due to borderline high K 4.7-5.0.  - Check BMET and mag   2. H/o syncope - ICD placed 10/21 at [facility]).  - She has been referred to [facility] to establish with EP. She has an appt with Dr Elberta Fortis next week. - Consider 2 week Zio.   3. 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 - On chronic home O2.  - Followed by Dr. Craige Cotta  4.  Hypertension  - Home BP readings 120-140 systolic. - Suspect anxiety is a contributing factor. - She did not tolerate increase in nadolol.   5. Severe OSA - on CPAP.  - Follows with Dr. Craige Cotta   6. H/o patent ductus repair  7. Anxiety/ Depression  - On Lexapro + Wellbutrin. - ? Switching her to a different SSRI that will help with appetite. - Management per PCP. She has been referred to a geriatrician in town, awaiting scheduling.  8. Physical Deconditioning/FTT - Body mass index is 15.29 kg/m. - She would benefit from [facility] PT/OT/SLP/nursing. Discussed with daughter and patient and they are agreeable. - Encouraged Ensure or Boost to help meet nutritional requirements.  9. Spontaneous limb jerks/ tremors - She says these are better today. - She has been referred to neurology.   Follow up with Dr. Gala Romney + echo as scheduled next month.  Stacy Ganong, FNP  3:23 PM

## 2021-01-24 ENCOUNTER — Other Ambulatory Visit: Payer: Self-pay

## 2021-01-24 ENCOUNTER — Encounter (HOSPITAL_COMMUNITY): Payer: Self-pay

## 2021-01-24 ENCOUNTER — Ambulatory Visit (HOSPITAL_COMMUNITY)
Admission: RE | Admit: 2021-01-24 | Discharge: 2021-01-24 | Disposition: A | Discharge status: Home / Self Care | Payer: Medicare HMO | Source: Ambulatory Visit | Attending: Family Medicine | Admitting: Family Medicine

## 2021-01-24 VITALS — BP 160/100 | HR 72 | Wt 97.6 lb

## 2021-01-24 DIAGNOSIS — J479 Bronchiectasis, uncomplicated: Secondary | ICD-10-CM | POA: Insufficient documentation

## 2021-01-24 DIAGNOSIS — I428 Other cardiomyopathies: Secondary | ICD-10-CM | POA: Diagnosis not present

## 2021-01-24 DIAGNOSIS — Z8774 Personal history of (corrected) congenital malformations of heart and circulatory system: Secondary | ICD-10-CM

## 2021-01-24 DIAGNOSIS — R627 Adult failure to thrive: Secondary | ICD-10-CM

## 2021-01-24 DIAGNOSIS — Z95810 Presence of automatic (implantable) cardiac defibrillator: Secondary | ICD-10-CM | POA: Insufficient documentation

## 2021-01-24 DIAGNOSIS — J9611 Chronic respiratory failure with hypoxia: Secondary | ICD-10-CM

## 2021-01-24 DIAGNOSIS — R42 Dizziness and giddiness: Secondary | ICD-10-CM | POA: Insufficient documentation

## 2021-01-24 DIAGNOSIS — Z681 Body mass index (BMI) 19 or less, adult: Secondary | ICD-10-CM | POA: Insufficient documentation

## 2021-01-24 DIAGNOSIS — I5022 Chronic systolic (congestive) heart failure: Secondary | ICD-10-CM | POA: Insufficient documentation

## 2021-01-24 DIAGNOSIS — I110 Hypertensive heart disease with heart failure: Secondary | ICD-10-CM | POA: Insufficient documentation

## 2021-01-24 DIAGNOSIS — I951 Orthostatic hypotension: Secondary | ICD-10-CM | POA: Diagnosis not present

## 2021-01-24 DIAGNOSIS — F419 Anxiety disorder, unspecified: Secondary | ICD-10-CM

## 2021-01-24 DIAGNOSIS — Z7982 Long term (current) use of aspirin: Secondary | ICD-10-CM | POA: Insufficient documentation

## 2021-01-24 DIAGNOSIS — G4733 Obstructive sleep apnea (adult) (pediatric): Secondary | ICD-10-CM

## 2021-01-24 DIAGNOSIS — R1310 Dysphagia, unspecified: Secondary | ICD-10-CM | POA: Diagnosis not present

## 2021-01-24 DIAGNOSIS — I5042 Chronic combined systolic (congestive) and diastolic (congestive) heart failure: Secondary | ICD-10-CM | POA: Diagnosis not present

## 2021-01-24 DIAGNOSIS — R5381 Other malaise: Secondary | ICD-10-CM

## 2021-01-24 DIAGNOSIS — R251 Tremor, unspecified: Secondary | ICD-10-CM | POA: Diagnosis not present

## 2021-01-24 DIAGNOSIS — R55 Syncope and collapse: Secondary | ICD-10-CM

## 2021-01-24 DIAGNOSIS — I10 Essential (primary) hypertension: Secondary | ICD-10-CM

## 2021-01-24 DIAGNOSIS — I5043 Acute on chronic combined systolic (congestive) and diastolic (congestive) heart failure: Secondary | ICD-10-CM

## 2021-01-24 DIAGNOSIS — F32A Depression, unspecified: Secondary | ICD-10-CM | POA: Insufficient documentation

## 2021-01-24 DIAGNOSIS — G255 Other chorea: Secondary | ICD-10-CM

## 2021-01-24 DIAGNOSIS — Z9981 Dependence on supplemental oxygen: Secondary | ICD-10-CM | POA: Insufficient documentation

## 2021-01-24 LAB — BASIC METABOLIC PANEL
Anion gap: 10 (ref 5–15)
BUN: 29 mg/dL — ABNORMAL HIGH (ref 8–23)
CO2: 32 mmol/L (ref 22–32)
Calcium: 9.4 mg/dL (ref 8.9–10.3)
Chloride: 94 mmol/L — ABNORMAL LOW (ref 98–111)
Creatinine, Ser: 1.48 mg/dL — ABNORMAL HIGH (ref 0.44–1.00)
GFR, Estimated: 36 mL/min — ABNORMAL LOW (ref >60)
Glucose, Bld: 97 mg/dL (ref 70–99)
Potassium: 4.6 mmol/L (ref 3.5–5.1)
Sodium: 136 mmol/L (ref 135–145)

## 2021-01-24 LAB — CBC
HCT: 43.1 % (ref 36.0–46.0)
Hemoglobin: 13.2 g/dL (ref 12.0–15.0)
MCH: 29.7 pg (ref 26.0–34.0)
MCHC: 30.6 g/dL (ref 30.0–36.0)
MCV: 97.1 fL (ref 80.0–100.0)
Platelets: 248 10*3/uL (ref 150–400)
RBC: 4.44 MIL/uL (ref 3.87–5.11)
RDW: 15 % (ref 11.5–15.5)
WBC: 8.3 10*3/uL (ref 4.0–10.5)
nRBC: 0 % (ref 0.0–0.2)

## 2021-01-24 LAB — MAGNESIUM: Magnesium: 2.1 mg/dL (ref 1.7–2.4)

## 2021-01-24 MED ORDER — MIDODRINE HCL 2.5 MG PO TABS: 2.5000 mg | ORAL_TABLET | Freq: Three times a day (TID) | ORAL | 6 refills | Status: DC

## 2021-01-24 NOTE — Addendum Note (Signed)
Encounter addended by: Jacklynn Ganong, FNP on: 01/24/2021 7:24 PM  Actions taken: Clinical Note Signed

## 2021-01-24 NOTE — Progress Notes (Signed)
Dorann Ou w/[facility] [phone]) to refer pt for PT/OT/ST/nursing care. Orders placed, she will pull from system and let us know if she need anything further.

## 2021-01-24 NOTE — Patient Instructions (Addendum)
Start wearing compression hose - Rx given. Start Midodrine 2.5 mg twice daily (helps with blood pressure). Labs today, will call you if any are abnormal. Return as scheduled to see Dr. Gala Romney in November.

## 2021-02-01 ENCOUNTER — Telehealth (HOSPITAL_COMMUNITY): Payer: Self-pay | Admitting: Cardiology

## 2021-02-01 ENCOUNTER — Encounter: Payer: Self-pay | Admitting: Cardiology

## 2021-02-01 ENCOUNTER — Other Ambulatory Visit: Payer: Self-pay

## 2021-02-01 ENCOUNTER — Ambulatory Visit (INDEPENDENT_AMBULATORY_CARE_PROVIDER_SITE_OTHER): Payer: Medicare HMO | Admitting: Cardiology

## 2021-02-01 VITALS — BP 128/80 | HR 81 | Ht 67.5 in | Wt 98.4 lb

## 2021-02-01 DIAGNOSIS — I428 Other cardiomyopathies: Secondary | ICD-10-CM

## 2021-02-01 NOTE — Progress Notes (Signed)
Electrophysiology Office Note   Date:  02/01/2021   ID:  Stacy Sanchez, DOB 1946/02/06, MRN [id number]  PCP:  Burnett Sheng, MD  Cardiologist:  [place] Primary Electrophysiologist:  Lakelynn Severtson Meredith Leeds, MD    Chief Complaint: CHF   History of Present Illness: Stacy Sanchez is a 75 y.o. female who is being seen today for the evaluation of CHF at the request of Burnett Sheng, MD. Presenting today for electrophysiology evaluation.  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  She was admitted to [facility] October 2021 with a syncopal episode and was diagnosed with a nonischemic cardiomyopathy.  Catheter revealed no coronary artery disease.  CT and head MRI were unremarkable.  She had a Biotronik ICD implanted at that time.  Today, she denies symptoms of palpitations, chest pain, shortness of breath, orthopnea, PND, lower extremity edema, claudication, dizziness, presyncope, syncope, bleeding, or neurologic sequela. The patient is tolerating medications without difficulties.    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)    Pseudomonas respiratory infection    Thyroid disease    Past Surgical History:  Procedure Laterality Date   ABDOMINAL HYSTERECTOMY     BRONCHOSCOPY  2001   CARDIAC SURGERY     COLONOSCOPY  2008   CYST REMOVAL TRUNK  1965   Left axilla; benign   DILATION AND CURETTAGE OF UTERUS  1975   HAND SURGERY Right 1983   ICD IMPLANT Left 02/01/2020   PATENT DUCTUS ARTERIOUS REPAIR  1952   RIGHT HEART CATH N/A 03/23/2020   Procedure: RIGHT HEART CATH;  Surgeon:  Jolaine Artist, MD;  Location: [facility];  Service: Cardiovascular;  Laterality: N/A;   TONSILLECTOMY  1956   VARICOSE VEIN SURGERY  1972     Current Outpatient Medications  Medication Sig Dispense Refill   acetaminophen (TYLENOL) 650 MG CR tablet Take 650 mg by mouth every 8 (eight) hours as needed for pain.      aspirin EC 81 MG tablet Take 81 mg by mouth daily.     buPROPion (WELLBUTRIN XL) 300 MG 24 hr tablet Take 300 mg by mouth daily.      escitalopram (LEXAPRO) 10 MG tablet Take 10 mg by mouth daily.     fluticasone (FLONASE) 50 MCG/ACT nasal spray Place 1 spray into both nostrils daily as needed for allergies or rhinitis.     gabapentin (NEURONTIN) 300 MG capsule Take 300 mg by mouth 2 (two) times daily.     ipratropium-albuterol (DUONEB) 0.5-2.5 (3) MG/3ML SOLN Take 3 mLs by nebulization 3 (three) times daily as needed. 810 mL 1   levothyroxine (SYNTHROID, LEVOTHROID) 50 MCG tablet Take 50-100 mcg by mouth See admin instructions. Take 50 mg  tablet daily on Monday through Friday  Take 100 mcg  Saturday and Sunday     midodrine (PROAMATINE) 2.5 MG tablet Take 1 tablet (2.5 mg total) by mouth 3 (three) times daily with meals. 90 tablet 6   Multiple Vitamin (MULTIVITAMIN WITH MINERALS) TABS tablet Take 1 tablet by mouth daily.     nadolol (CORGARD) 20 MG tablet Take 1 tablet (20 mg total) by mouth at bedtime. (  Patient taking differently: Take 20 mg by mouth at bedtime. Patient takes 1/2 tablet daily.) 90 tablet 3   Nebulizers (COMPRESSOR/NEBULIZER) MISC 1 Product by Does not apply route once for 1 dose. 1 each 0   OXYGEN Inhale 2-3 L into the lungs See admin instructions. As needed and at bedtime use 3 liters with CPAP     polyethylene glycol powder (GLYCOLAX/MIRALAX) 17 GM/SCOOP powder Take 17 g by mouth daily as needed for moderate constipation.      rosuvastatin (CRESTOR) 10 MG tablet Take 10 mg by mouth daily.     sodium chloride HYPERTONIC 3 % nebulizer solution Take  4 mLs by nebulization 2 (two) times daily as needed for other. 720 mL 2   No current facility-administered medications for this visit.    Allergies:   Losartan, Amikacin, Cefepime, Ethambutol, Levaquin [levofloxacin], Parafon forte dsc [chlorzoxazone], Rifabutin, Tequin [gatifloxacin], Zyvox [linezolid], and Lisinopril   Social History:  The patient  reports that she has never smoked. She has never been exposed to tobacco smoke. She has never used smokeless tobacco. She reports that she does not drink alcohol and does not use drugs.   Family History:  The patient's family history includes Thyroid disease in her daughter.    ROS:  Please see the history of present illness.   Otherwise, review of systems is positive for none.   All other systems are reviewed and negative.    PHYSICAL EXAM: VS:  BP 128/80   Pulse 81   Ht 5' 7.5" (1.715 m)   Wt 98 lb 6.4 oz (44.6 kg)   SpO2 96%   BMI 15.18 kg/m  , BMI Body mass index is 15.18 kg/m. GEN: Well nourished, well developed, in no acute distress  HEENT: normal  Neck: no JVD, carotid bruits, or masses Cardiac: RRR; no murmurs, rubs, or gallops,no edema  Respiratory:  clear to auscultation bilaterally, normal work of breathing GI: soft, nontender, nondistended, + BS MS: no deformity or atrophy  Skin: warm and dry, device pocket is well healed Neuro:  Strength and sensation are intact Psych: euthymic mood, full affect  EKG:  EKG is ordered today. Personal review of the ekg ordered shows sinus rhythm, rate 81  Device interrogation is reviewed today in detail.  See PaceArt for details.   Recent Labs: 02/24/2020: ALT 13 12/24/2020: B Natriuretic Peptide 263.9; TSH 5.291 01/24/2021: BUN 29; Creatinine, Ser 1.48; Hemoglobin 13.2; Magnesium 2.1; Platelets 248; Potassium 4.6; Sodium 136    Lipid Panel  No results found for: CHOL, TRIG, HDL, CHOLHDL, VLDL, LDLCALC, LDLDIRECT   Wt Readings from Last 3 Encounters:  02/01/21 98 lb 6.4 oz (44.6  kg)  01/24/21 97 lb 9.6 oz (44.3 kg)  01/02/21 100 lb (45.4 kg)      Other studies Reviewed: Additional studies/ records that were reviewed today include: TTE 05/01/20  Review of the above records today demonstrates:   1. Left ventricular ejection fraction, by estimation, is 40 to 45%. The  left ventricle has mildly decreased function. The left ventricle  demonstrates global hypokinesis. Left ventricular diastolic parameters are  consistent with Grade I diastolic  dysfunction (impaired relaxation). Elevated left atrial pressure.   2. Right ventricular systolic function is normal. The right ventricular  size is normal. There is normal pulmonary artery systolic pressure.   3. The mitral valve is normal in structure. Trivial mitral valve  regurgitation.   4. The aortic valve is tricuspid. Aortic valve regurgitation is mild.   5. The  inferior vena cava is normal in size with greater than 50%  respiratory variability, suggesting right atrial pressure of 3 mmHg.    ASSESSMENT AND PLAN:  1.  Chronic systolic heart failure due to nonischemic cardiomyopathy: Ejection fraction at [facility] in 2021 was 20 to 25%.  This is improved to 45%.  She is status post Biotronik ICD.  Device functioning appropriately.  No changes at this time.  We Lautaro Koral arrange for follow-up in device clinic.  Continue optimal medical therapy per primary cardiology.  2.  Chronic hypoxic respiratory failure in setting of MAI infections with bronchiectasis: Followed by pulmonary care.  On home oxygen.  3.  Hypertension: Currently well controlled    Current medicines are reviewed at length with the patient today.   The patient does not have concerns regarding her medicines.  The following changes were made today:  none  Labs/ tests ordered today include:  Orders Placed This Encounter  Procedures   EKG 12-Lead     Disposition:   FU with Harley Fitzwater 1 year  Signed, Hayes Czaja Meredith Leeds, MD  02/01/2021 4:10 PM      [employer] [place] [address] [phone] (office) [phone] (fax)

## 2021-02-01 NOTE — Telephone Encounter (Signed)
Stacy Sanchez,PT with [facility] called with FYI to report Patient was originally ordered PT/OT/and [facility] After initial visit, patient has declined all services.  Will discharge patient from [facility]

## 2021-02-08 ENCOUNTER — Encounter: Payer: Self-pay | Admitting: Gastroenterology

## 2021-02-13 ENCOUNTER — Telehealth (HOSPITAL_COMMUNITY): Payer: Self-pay | Admitting: *Deleted

## 2021-02-13 NOTE — Telephone Encounter (Signed)
Pt has been taking midodrine 2.5mg  tid. Pt will increase to 5mg  tid and call office it symptoms continue or worsen.

## 2021-02-13 NOTE — Telephone Encounter (Signed)
Pt called to report her blood pressure drops when she goes from sitting to standing. Pt said said she gets dizzy when she stands up. Pt said systolic bp drops 20 points when she stands up.  Pt said this has been going on the past 3 weeks. Pt said she is wearing compression hose and taking meds as prescribed.  Routed to [employer]

## 2021-02-20 ENCOUNTER — Telehealth (HOSPITAL_COMMUNITY): Payer: Self-pay | Admitting: *Deleted

## 2021-02-20 NOTE — Telephone Encounter (Signed)
Pt left vm stating her blood pressure is now running high since increasing midodrine to 5mg  tid. Systolic bp 160s and diastolic bp 110s. Pt is asymptomatic. Denies headache or chest pain.    Routed to , NP for advice.

## 2021-02-21 NOTE — Telephone Encounter (Signed)
Pt aware and agreeable with plan.  

## 2021-02-22 NOTE — Telephone Encounter (Signed)
Pt called to report todays bp after decreasing midodrine bp 188/116, 181/131, 175/26, 178/118. Pt denies headache or any symptoms.  Routed to Amy Clegg,NP

## 2021-02-23 NOTE — Telephone Encounter (Signed)
Pt aware and agreeable.  

## 2021-02-23 NOTE — Addendum Note (Signed)
Addended by: Modesta Messing on: 02/23/2021 11:50 AM   Modules accepted: Orders

## 2021-03-02 ENCOUNTER — Ambulatory Visit (HOSPITAL_COMMUNITY)
Admission: RE | Admit: 2021-03-02 | Discharge: 2021-03-02 | Disposition: A | Discharge status: Home / Self Care | Payer: Medicare HMO | Source: Ambulatory Visit | Attending: Internal Medicine | Admitting: Internal Medicine

## 2021-03-02 ENCOUNTER — Encounter (HOSPITAL_COMMUNITY): Payer: Self-pay | Admitting: Internal Medicine

## 2021-03-02 ENCOUNTER — Ambulatory Visit (HOSPITAL_BASED_OUTPATIENT_CLINIC_OR_DEPARTMENT_OTHER)
Admission: RE | Admit: 2021-03-02 | Discharge: 2021-03-02 | Disposition: A | Discharge status: Home / Self Care | Payer: Medicare HMO | Source: Ambulatory Visit | Attending: Internal Medicine | Admitting: Internal Medicine

## 2021-03-02 VITALS — BP 130/90 | HR 97 | Wt 95.2 lb

## 2021-03-02 DIAGNOSIS — I110 Hypertensive heart disease with heart failure: Secondary | ICD-10-CM | POA: Diagnosis present

## 2021-03-02 DIAGNOSIS — I951 Orthostatic hypotension: Secondary | ICD-10-CM | POA: Diagnosis not present

## 2021-03-02 DIAGNOSIS — J479 Bronchiectasis, uncomplicated: Secondary | ICD-10-CM | POA: Diagnosis not present

## 2021-03-02 DIAGNOSIS — I428 Other cardiomyopathies: Secondary | ICD-10-CM | POA: Diagnosis not present

## 2021-03-02 DIAGNOSIS — R42 Dizziness and giddiness: Secondary | ICD-10-CM | POA: Insufficient documentation

## 2021-03-02 DIAGNOSIS — Z95810 Presence of automatic (implantable) cardiac defibrillator: Secondary | ICD-10-CM | POA: Diagnosis not present

## 2021-03-02 DIAGNOSIS — F419 Anxiety disorder, unspecified: Secondary | ICD-10-CM | POA: Diagnosis not present

## 2021-03-02 DIAGNOSIS — R6881 Early satiety: Secondary | ICD-10-CM | POA: Diagnosis not present

## 2021-03-02 DIAGNOSIS — Z681 Body mass index (BMI) 19 or less, adult: Secondary | ICD-10-CM | POA: Diagnosis not present

## 2021-03-02 DIAGNOSIS — R251 Tremor, unspecified: Secondary | ICD-10-CM | POA: Diagnosis not present

## 2021-03-02 DIAGNOSIS — R64 Cachexia: Secondary | ICD-10-CM | POA: Diagnosis not present

## 2021-03-02 DIAGNOSIS — Z79899 Other long term (current) drug therapy: Secondary | ICD-10-CM | POA: Insufficient documentation

## 2021-03-02 DIAGNOSIS — Z9989 Dependence on other enabling machines and devices: Secondary | ICD-10-CM | POA: Diagnosis not present

## 2021-03-02 DIAGNOSIS — G4733 Obstructive sleep apnea (adult) (pediatric): Secondary | ICD-10-CM | POA: Insufficient documentation

## 2021-03-02 DIAGNOSIS — I5022 Chronic systolic (congestive) heart failure: Secondary | ICD-10-CM | POA: Insufficient documentation

## 2021-03-02 DIAGNOSIS — R000 Tachycardia, unspecified: Secondary | ICD-10-CM | POA: Insufficient documentation

## 2021-03-02 DIAGNOSIS — R627 Adult failure to thrive: Secondary | ICD-10-CM | POA: Diagnosis not present

## 2021-03-02 DIAGNOSIS — I10 Essential (primary) hypertension: Secondary | ICD-10-CM

## 2021-03-02 DIAGNOSIS — R531 Weakness: Secondary | ICD-10-CM | POA: Insufficient documentation

## 2021-03-02 DIAGNOSIS — F32A Depression, unspecified: Secondary | ICD-10-CM | POA: Diagnosis not present

## 2021-03-02 DIAGNOSIS — Z9981 Dependence on supplemental oxygen: Secondary | ICD-10-CM | POA: Diagnosis not present

## 2021-03-02 DIAGNOSIS — J9611 Chronic respiratory failure with hypoxia: Secondary | ICD-10-CM

## 2021-03-02 DIAGNOSIS — Z713 Dietary counseling and surveillance: Secondary | ICD-10-CM | POA: Diagnosis not present

## 2021-03-02 DIAGNOSIS — R630 Anorexia: Secondary | ICD-10-CM | POA: Insufficient documentation

## 2021-03-02 DIAGNOSIS — I351 Nonrheumatic aortic (valve) insufficiency: Secondary | ICD-10-CM | POA: Insufficient documentation

## 2021-03-02 LAB — ECHOCARDIOGRAM COMPLETE
AV Vena cont: 0.2 cm
Calc EF: 39 %
S' Lateral: 3 cm
Single Plane A2C EF: 30.4 %
Single Plane A4C EF: 49.8 %

## 2021-03-02 MED ORDER — NADOLOL 20 MG PO TABS: 20.0000 mg | ORAL_TABLET | Freq: Two times a day (BID) | ORAL | 6 refills | Status: AC

## 2021-03-02 NOTE — Progress Notes (Signed)
ADVANCED HF CLINIC NOTE  PCP: Glenna Durand, MD HF Cardiologist: Dr. 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 History also notable for severe OSA, patent ductus repair at age 24. She has been referred by Stacy Canary, PA at [facility] for further evaluation of new-onset systolic HF.     She was admitted to [facility] in 10/21 with syncopal spell and diagnosed with new nonischemic cardiomyopathy. Echocardiogram showed EF 20-25%, normal RV, LVIDD 4.6 cm. (Previous echocardiogram 2019 showed EF 50-55%, and either a torn chordae or redundant chordae by report.) Cardiac cath revealed no CAD. EKG showed sinus rhythm with T wave abnormalities with narrow QRS. CT and MRI of head were unremarkable. EP was consulted and ICD was placed.   Recently struggling with prominent sinus tachycardia and progressive HF symptoms.   Underwent RHC on 03/23/20 which showed well compensated hemodynamics with very mild PAH  Zio patch 12/21 showed primarily sinus rhythm - avg HR of 101 bpm. +NSVT Rare PACs and PVCs  Echo  05/01/20 EF 45-50% RV ok.  Her family also had growing concerns with her amount of weakness and sent her to the ED for evaluation on 12/24/20. W/u was unremarkable. There was also concern for possible malignancy given 30 lb wt loss but CTs of neck, check, A/P were unremarkable. EDP also felt that symptoms were most likely due to depression. They did recommend outpatient referral to gastroenterology for consideration for endoscopy given the patient's early satiety, epigastric discomfort and anorexia. Labs from ED. Not suggestive of low ouput HF. CO2 35, SCr 1.4 c/w baseline.  Seen for acute visit for hypertension 01/03/21. Struggling with anxiety. Her beta blockade was increased and she was referred to EP to establish care as well as a geriatrician to establish  her primary care locally.   Last seen for f/u 10/22.  She was orthostatic and started on midodrine. Prescribed compression stockings. Midodrine later stopped d/t HTN - BP up to 160s-180s/100s-120s  She is here today for f/u. Reports blood pressure has improved after stopping midodrine but remains elevated. PT is working with her in home but concerned about BP. Does get woozy with position changes. She is short of breath just walking across her apartment. Uses a walker. She resides in senior independent living. No orthopnea, PND or lower extremity edema.   Has lost a lot of weight. Poor taste in her mouth.  Echo today: EF 45-50%, RV okay   Cardiac Studies: - Echo  05/01/20 EF 45-50% RV ok.   - RHC 12/21 RA = 2 RV = 33/2 PA = 35/10 (22) PCW = 6 Fick cardiac output/index = 4.1/2.6 PVR = 3.8 WU Ao sat = 94% PA sat = 61%, 60%  Current Outpatient Medications on File Prior to Encounter  Medication Sig Dispense Refill   acetaminophen (TYLENOL) 650 MG CR tablet Take 650 mg by mouth every 8 (eight) hours as needed for pain.      aspirin EC 81 MG tablet Take 81 mg by mouth daily.     buPROPion (WELLBUTRIN XL) 300 MG 24 hr tablet Take 300 mg by mouth daily.      escitalopram (LEXAPRO) 10 MG tablet Take 10 mg by mouth daily.     fluticasone (FLONASE) 50 MCG/ACT nasal spray Place 1 spray into both nostrils daily as needed for allergies or rhinitis.  gabapentin (NEURONTIN) 300 MG capsule Take 300 mg by mouth 2 (two) times daily.     ipratropium-albuterol (DUONEB) 0.5-2.5 (3) MG/3ML SOLN Take 3 mLs by nebulization 3 (three) times daily as needed. 810 mL 1   levothyroxine (SYNTHROID, LEVOTHROID) 50 MCG tablet Take 50-100 mcg by mouth See admin instructions. Take 50 mg  tablet daily on Monday through Friday  Take 100 mcg  Saturday and Sunday     Melatonin 10 MG TABS Take 1 tablet by mouth at bedtime.     Multiple Vitamin (MULTIVITAMIN WITH MINERALS) TABS tablet Take 1 tablet by mouth daily.      Nebulizers (COMPRESSOR/NEBULIZER) MISC 1 Product by Does not apply route once for 1 dose. 1 each 0   OXYGEN Inhale 2-3 L into the lungs See admin instructions. As needed and at bedtime use 3 liters with CPAP     polyethylene glycol powder (GLYCOLAX/MIRALAX) 17 GM/SCOOP powder Take 17 g by mouth daily as needed for moderate constipation.      rosuvastatin (CRESTOR) 10 MG tablet Take 10 mg by mouth daily.     sodium chloride HYPERTONIC 3 % nebulizer solution Take 4 mLs by nebulization 2 (two) times daily as needed for other. 720 mL 2   No current facility-administered medications on file prior to encounter.   BP 130/90   Pulse 97   Wt 43.2 kg (95 lb 3.2 oz)   SpO2 98% Comment: 2 l n/c  BMI 14.69 kg/m   PHYSICAL EXAM: General:  Thin, elderly female. No distress. On supplemental O2. HEENT: temporal wasting Neck: Supple. No JVD. Carotids 2+ bilat; no bruits. No lymphadenopathy or thryomegaly appreciated. Cor: PMI nondisplaced. Regular rate & rhythm. No rubs, gallops or murmurs. Lungs: Diminished Abdomen: Soft, nontender, nondistended. No hepatosplenomegaly. No bruits or masses. Good bowel sounds. Extremities: No cyanosis, clubbing, rash, edema Neuro: Alert & oriented x 3, cranial nerves grossly intact. Moves all 4 extremities w/o difficulty. Affect pleasant.  ECG: Sinus tachycardia 100 bpm, RAE  Assessment: 1.Systolic HF due to NICM with recovered EF - onset 10/21. Echo at [facility] EF 20-25%  - etiology unclear. cath at [facility] 10/21 no CAD.  - s/p Biotronik ICD in setting of syncopal episode with presumed VT. Now following with EP. - RHC 12/21 - well compensated. - Echo 05/01/20 EF 45-50% RV ok. - Echo today EF 45-50% RV okay - Increase nadolol as below (Intolerant to Toprol w/ bad taste in her mouth).  - Unable to tolerate losartan or lisinopril due to cough and low BP. - Off digoxin with recovery of LV function. - Avoiding spironolactone due to borderline high K 4.7-5.0.    2. H/o  syncope - ICD placed 10/21 at [facility]).  - Recently established with Dr. Curt Sanchez 10/22 and enrolled in device clinic   3. 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 - On chronic home O2.  - Followed by Dr. Halford Sanchez  4. Hypertension w/ orthostatic hypotension - Midodrine recently stopped d/t SBP 160s-180s. Use caution with position changes. - Home BP remains elevated.  - Increase nadolol to 10 mg BID. Can take extra 10 mg as needed for SBP > 160    5. Severe OSA - on CPAP.  - Follows with Dr. Halford Sanchez   6. H/o patent ductus repair  7. Anxiety/ Depression  - On Lexapro + Wellbutrin. - Management per PCP. She has been referred to a geriatrician in town.  8. Physical  Deconditioning/FTT - Body mass index is 14.69 kg/m. - Referred for [place] PT/OT/SLP/nursing at last visit. Can participate in PT as long as long as SBP < 180 and DBP < 120 - Encouraged her to increase caloric intake. Already using Boost and Ensure.  9. Spontaneous limb jerks/ tremors - She has been referred to neurology.   Follow-up 6 months  FINCH, LINDSAY N, PA-C  5:06 PM  Patient seen and examined with the above-signed Advanced Practice Provider and/or Housestaff. I personally reviewed laboratory data, imaging studies and relevant notes. I independently examined the patient and formulated the important aspects of the plan. I have edited the note to reflect any of my changes or salient points. I have personally discussed the plan with the patient and/or family.  She continues to struggle with weakness and SOB. Midodrine stopped and now BP high with BP spikes and PT keeps her from therapy if BP high. SBP can spike to 160-180 range with diastolics [id number],   Echo today EF 45-50%  General:  Elderly. Cachetic. No resp difficulty HEENT: normal Neck: supple. no JVD. Carotids 2+ bilat; no bruits. No  lymphadenopathy or thryomegaly appreciated. Cor: PMI nondisplaced. Regular rate & rhythm. No rubs, gallops or murmurs. Lungs:decreased throughout  Abdomen: soft, nontender, nondistended. No hepatosplenomegaly. No bruits or masses. Good bowel sounds. Extremities: no cyanosis, clubbing, rash, edema Neuro: alert & orientedx3, cranial nerves grossly intact. moves all 4 extremities w/o difficulty. Affect pleasant  Overall HF stable though she remains quite symptomatic. I suspect this is multifactorial. Echo stable today. Having frequent BP spikes. Increase nadolol to 10 mg BID. Can take extra 10 mg as needed for SBP > 160 and/or SBP > 110.   Glori Bickers, MD  2:18 PM

## 2021-03-02 NOTE — Progress Notes (Signed)
  Echocardiogram 2D Echocardiogram has been performed.  Augustine Radar 03/02/2021, 3:10 PM

## 2021-03-02 NOTE — Patient Instructions (Signed)
Take Nadolol 10 mg (1/2 tab) Twice daily, **Can take extra 10 mg (1/2 tab) AS NEEDED  Your physician recommends that you schedule a follow-up appointment in: 6 months (May, 2023), **PLEASE CALL OUR OFFICE IN MARCH TO SCHEDULE THIS APPOINTMENT  If you have any questions or concerns before your next appointment please send [place] a message through [place] or call our office at [phone].    TO LEAVE A MESSAGE FOR THE NURSE SELECT OPTION 2, PLEASE LEAVE A MESSAGE INCLUDING: YOUR NAME DATE OF BIRTH CALL BACK NUMBER REASON FOR CALL**this is important as we prioritize the call backs  YOU WILL RECEIVE A CALL BACK THE SAME DAY AS LONG AS YOU CALL BEFORE 4:00 PM  At the [facility], you and your health needs are our priority. As part of our continuing mission to provide you with exceptional heart care, we have created designated Provider Care Teams. These Care Teams include your primary Cardiologist (physician) and Advanced Practice Providers (APPs- Physician Assistants and Nurse Practitioners) who all work together to provide you with the care you need, when you need it.   You may see any of the following providers on your designated Care Team at your next follow up: Dr Arvilla Meres Dr Carron Curie, NP Robbie Lis, [place] [facility] [place], [place] Karle Plumber, PharmD   Please be sure to bring in all your medications bottles to every appointment.

## 2021-03-06 ENCOUNTER — Encounter: Payer: Self-pay | Admitting: Gastroenterology

## 2021-03-06 ENCOUNTER — Other Ambulatory Visit (HOSPITAL_COMMUNITY): Payer: Self-pay

## 2021-03-06 ENCOUNTER — Ambulatory Visit (INDEPENDENT_AMBULATORY_CARE_PROVIDER_SITE_OTHER): Payer: Medicare HMO | Admitting: Gastroenterology

## 2021-03-06 DIAGNOSIS — R1310 Dysphagia, unspecified: Secondary | ICD-10-CM

## 2021-03-06 DIAGNOSIS — K581 Irritable bowel syndrome with constipation: Secondary | ICD-10-CM | POA: Diagnosis not present

## 2021-03-06 DIAGNOSIS — K219 Gastro-esophageal reflux disease without esophagitis: Secondary | ICD-10-CM | POA: Diagnosis not present

## 2021-03-06 DIAGNOSIS — Z7689 Persons encountering health services in other specified circumstances: Secondary | ICD-10-CM

## 2021-03-06 DIAGNOSIS — R053 Chronic cough: Secondary | ICD-10-CM | POA: Diagnosis not present

## 2021-03-06 DIAGNOSIS — K5904 Chronic idiopathic constipation: Secondary | ICD-10-CM

## 2021-03-06 MED ORDER — LANSOPRAZOLE 30 MG PO CPDR: 30.0000 mg | DELAYED_RELEASE_CAPSULE | Freq: Two times a day (BID) | ORAL | 3 refills | Status: AC

## 2021-03-06 NOTE — Patient Instructions (Addendum)
We have sent the following medications to your pharmacy for you to pick up at your convenience: Lansoprazole  Start : Lansoprazole 30mg  -Take 1 capsules by mouth twice daily.  Try to eat small frequent meals.   Increase your water intake.   Continue using Miralax 1/2 capful daily as directed.     You have been scheduled for a Barium Esophogram at [facility] (1st floor of the hospital) on 03/20/21 at 1:00pm. Please arrive 15 minutes prior to your appointment for registration. Make certain not to have anything to eat or drink 3 hours prior to your test. If you need to reschedule for any reason, please contact radiology at [phone] to do so. __________________________________________________________________ A barium swallow is an examination that concentrates on views of the esophagus. This tends to be a double contrast exam (barium and two liquids which, when combined, create a gas to distend the wall of the oesophagus) or single contrast (non-ionic iodine based). The study is usually tailored to your symptoms so a good history is essential. Attention is paid during the study to the form, structure and configuration of the esophagus, looking for functional disorders (such as aspiration, dysphagia, achalasia, motility and reflux) EXAMINATION You may be asked to change into a gown, depending on the type of swallow being performed. A radiologist and radiographer will perform the procedure. The radiologist will advise you of the type of contrast selected for your procedure and direct you during the exam. You will be asked to stand, sit or lie in several different positions and to hold a small amount of fluid in your mouth before being asked to swallow while the imaging is performed .In some instances you may be asked to swallow barium coated marshmallows to assess the motility of a solid food bolus. The exam can be recorded as a digital or video fluoroscopy procedure. POST PROCEDURE It  will take 1-2 days for the barium to pass through your system. To facilitate this, it is important, unless otherwise directed, to increase your fluids for the next 24-48hrs and to resume your normal diet.  This test typically takes about 30 minutes to perform. _______________________________________________________________  [phone] have been scheduled for a modified barium swallow on 03/20/21 at 1:30pm. Please arrive 15 minutes prior to your test for registration. You will go to [facility]  Radiology (1st Floor) for your appointment. Should you need to cancel or reschedule your appointment, please contact [phone] [place]) or [phone] [place]). ______________________________________________________________ A Modified Barium Swallow Study, or MBS, is a special x-ray that is taken to check swallowing skills. It is carried out by a Gerri Spore and a Marine scientist (SLP). During this test, yourmouth, throat, and esophagus, a muscular tube which connects your mouth to your stomach, is checked. The test will help you, your doctor, and the SLP plan what types of foods and liquids are easier for you to swallow. The SLP will also identify positions and ways to help you swallow more easily and safely. What will happen during an MBS? You will be taken to an x-ray room and seated comfortably. You will be asked to swallow small amounts of food and liquid mixed with barium. Barium is a liquid or paste that allows images of your mouth, throat and esophagus to be seen on x-ray. The x-ray captures moving images of the food you are swallowing as it travels from your mouth through your throat and into your esophagus. This test helps identify whether food or liquid is  entering your lungs (aspiration). The test also shows which part of your mouth or throat lacks strength or coordination to move the food or liquid in the right direction. This test typically takes 30 minutes to 1 hour to  complete.  Please keep follow-up with Dr. Lavon Paganini on 05/04/21 at 8:10am   Thank you for choosing me and [facility].  Dr.Nandigam

## 2021-03-06 NOTE — Progress Notes (Signed)
Stacy Sanchez    [id number]    November 10, 1945  Primary Care Physician:Warnimont, Gerald Stabs, MD  Referring Physician: Burnett Sheng, MD [address] [place],  [place] [address]   Chief complaint:  Dysphagia, constipation  HPI: 75 year old very pleasant female with history of MAI, bronchiectasis, chronic respiratory failure on continuous oxygen, OSA on CPAP, severe CHF s/p ICD with complaints of worsening dysphagia and constipation  She is accompanied by [name], her daughter for this visit.  Complaints of worsening dysphagia, she struggles to swallow any large pills.  She feels difficulty swallowing is worse.  Occasionally she has trouble swallowing liquids and food. She also has coughing episodes when she tries to swallow.  She has had significant weight loss ~30 pounds in the past year, anorexia, early satiety and dyspepsia /epigastric discomfort No vomiting.  She has bitter taste in the mouth constantly, occasional heartburn and regurgitation.  No change in bowel habits.  She has history of chronic constipation, is worse in the past year.  No melena or rectal bleeding.  She is taking MiraLAX 1 capful every 2 to 3 days with bowel movement 2 or 3 times a week.  Complains of hemorrhoids and difficulty evacuating.  Most recent echocardiogram 11/11//22 by Dr.Bensimhon was EF 45-50% R and L heart cath 03/2020: negative for CAD and mild PAH S/p patent ductus repair at age 71  CT abd & pelvis 12/24/20 Progressive cylindrical bronchiectasis within the visualized right lung base with superimposed airway impaction which may reflect changes of acute infection.  11 mm indeterminate low-attenuation lesion within the interpolar region of the left kidney, possibly representing a hyperdense renal cyst or cystic renal neoplasm. This could be better assessed with dedicated renal sonography, if indicated.   Mild left nonobstructing nephrolithiasis.   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    Outpatient Encounter Medications as of 03/06/2021  Medication Sig   acetaminophen (TYLENOL) 650 MG CR tablet Take 650 mg by mouth every 8 (eight) hours as needed for pain.    aspirin EC 81 MG tablet Take 81 mg by mouth daily.   buPROPion (WELLBUTRIN XL) 300 MG 24 hr tablet Take 300 mg by mouth daily.    escitalopram (LEXAPRO) 10 MG tablet Take 10 mg by mouth daily.   fluticasone (FLONASE) 50 MCG/ACT nasal spray Place 1 spray into both nostrils daily as needed for allergies or rhinitis.   gabapentin (NEURONTIN) 300 MG capsule Take 300 mg by mouth 2 (two) times daily.   ipratropium-albuterol (DUONEB) 0.5-2.5 (3) MG/3ML SOLN Take 3 mLs by nebulization 3 (three) times daily as needed.   levothyroxine (SYNTHROID, LEVOTHROID) 50 MCG tablet Take 50-100 mcg by mouth See admin instructions. Take 50 mg  tablet daily on Monday through Friday  Take 100 mcg  Saturday and Sunday   Melatonin 10 MG TABS Take 1 tablet by mouth at bedtime.   Multiple Vitamin (MULTIVITAMIN WITH MINERALS) TABS tablet Take 1 tablet by mouth daily.   nadolol (CORGARD) 20 MG tablet Take 1 tablet (20 mg total) by mouth 2 (two) times daily.   Nebulizers (COMPRESSOR/NEBULIZER) MISC 1 Product by Does not apply route once for 1 dose.   OXYGEN Inhale 2-3 L into the lungs See admin instructions. As needed and at bedtime use 3 liters with CPAP   polyethylene glycol powder (GLYCOLAX/MIRALAX) 17 GM/SCOOP powder Take 17 g by mouth daily as needed  for moderate constipation.    rosuvastatin (CRESTOR) 10 MG tablet Take 10 mg by mouth daily.   sodium chloride HYPERTONIC 3 % nebulizer solution Take 4 mLs by nebulization 2 (two) times daily as needed for other.   No facility-administered encounter medications on file as of 03/06/2021.    Allergies as of 03/06/2021 - Review Complete 03/02/2021  Allergen Reaction Noted   Losartan Cough 05/01/2020   Amikacin Other  (See Comments) 03/24/2016   Cefepime Other (See Comments) 07/17/2019   Ethambutol Other (See Comments) 03/24/2016   Levaquin [levofloxacin] Other (See Comments) 03/24/2016   Parafon forte dsc [chlorzoxazone] Hives 03/24/2016   Rifabutin Diarrhea 03/24/2016   Tequin [gatifloxacin] Other (See Comments) 03/24/2016   Zyvox [linezolid] Diarrhea 03/24/2016   Lisinopril Nausea Only 03/02/2020    Past Medical History:  Diagnosis Date   Arthritis    CHF (congestive heart failure) ([place])    Hypertension    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[place])    Pseudomonas respiratory infection    Thyroid disease     Past Surgical History:  Procedure Laterality Date   ABDOMINAL HYSTERECTOMY     BRONCHOSCOPY  2001   CARDIAC SURGERY     COLONOSCOPY  2008   CYST REMOVAL TRUNK  1965   Left axilla; benign   [place]   HAND SURGERY Right 1983   ICD IMPLANT Left 02/01/2020   PATENT DUCTUS ARTERIOUS REPAIR  1952   RIGHT HEART CATH N/A 03/23/2020   Procedure: RIGHT HEART CATH;  Surgeon: Jolaine Artist, MD;  Location: [facility];  Service: Cardiovascular;  Laterality: N/A;   TONSILLECTOMY  1956   VARICOSE VEIN SURGERY  1972    Family History  Problem Relation Age of Onset   Thyroid disease Daughter     Social History   Socioeconomic History   Marital status: Widowed    Spouse name: Not on file   Number of children: Not on file   Years of education: Not on file   Highest education level: Not on file  Occupational History   Not on file  Tobacco Use   Smoking status: Never    Passive exposure: Never   Smokeless tobacco: Never  Vaping Use   Vaping Use: Never used  Substance and Sexual Activity   Alcohol use: No   Drug use: No   Sexual activity: Not Currently  Other Topics Concern   Not on file  Social History Narrative   Not on file   Social Determinants of Health   Financial Resource Strain: Not on file  Food  Insecurity: Not on file  Transportation Needs: Not on file  Physical Activity: Not on file  Stress: Not on file  Social Connections: Not on file  Intimate Partner Violence: Not on file      Review of systems: All other review of systems negative except as mentioned in the HPI.   Physical Exam: Vitals:   03/06/21 1404  BP: 120/70  Pulse: 70   Body mass index is 14.99 kg/m. Gen:      No acute distress, frail, cachectic appearing, on continuous oxygen, uses walker HEENT:  sclera anicteric Abd:      soft, non-tender; no palpable masses, no distension Ext:    No edema Neuro: alert and oriented x 3 Psych: normal mood and affect  Data Reviewed:  Reviewed labs, radiology imaging, old records and pertinent past GI work up   Assessment  and Plan/Recommendations:  75 year old very pleasant female with history of MAI, bronchiectasis, chronic hypoxic respiratory failure, CHF s/p ICD and OSA on CPAP with complaints of dysphagia and chronic cough  Will obtain modified barium swallow and barium esophagram to exclude silent aspiration, GERD and/or peptic stricture. Plan for EGD based on barium esophagram findings  GERD and chronic cough: Start lansoprazole 30 mg twice daily, 30 minutes before lunch and dinner Antireflux measures  Chronic idiopathic constipation: Use MiraLAX half capful daily and increase daily water intake  Anorexia and weight loss: Multifactorial Advised patient to eat small frequent meals Continue protein drinks in between meals as tolerated to improve calorie intake  She is interested in establishing with Geriatrics medicine provider, will refer to Dr. Veleta Miners  Return in 4 to 6 weeks  This visit required >60 minutes of patient care (this includes precharting, chart review, review of results, face-to-face time used for counseling as well as treatment plan and follow-up. The patient was provided an opportunity to ask questions and all were answered. The  patient agreed with the plan and demonstrated an understanding of the instructions.  Damaris Hippo , MD    CC: Burnett Sheng, MD

## 2021-03-07 ENCOUNTER — Ambulatory Visit: Payer: Medicare HMO | Admitting: Gastroenterology

## 2021-03-08 ENCOUNTER — Telehealth (HOSPITAL_COMMUNITY): Payer: Self-pay | Admitting: *Deleted

## 2021-03-08 NOTE — Telephone Encounter (Signed)
V.O for occupational therapy, physical therapy, speech, and nursing given to [facility]. Office note faxed to [phone]

## 2021-03-20 ENCOUNTER — Encounter (HOSPITAL_COMMUNITY): Payer: Medicare HMO

## 2021-03-20 ENCOUNTER — Ambulatory Visit (HOSPITAL_COMMUNITY)
Admission: RE | Admit: 2021-03-20 | Discharge: 2021-03-20 | Disposition: A | Discharge status: Home / Self Care | Payer: Medicare HMO | Source: Ambulatory Visit | Attending: Gastroenterology | Admitting: Gastroenterology

## 2021-03-20 ENCOUNTER — Other Ambulatory Visit: Payer: Self-pay | Admitting: Gastroenterology

## 2021-03-20 ENCOUNTER — Other Ambulatory Visit (HOSPITAL_COMMUNITY): Payer: Self-pay | Admitting: Gastroenterology

## 2021-03-20 ENCOUNTER — Ambulatory Visit (HOSPITAL_COMMUNITY): Payer: Medicare HMO

## 2021-03-20 ENCOUNTER — Other Ambulatory Visit: Payer: Self-pay

## 2021-03-20 DIAGNOSIS — R1310 Dysphagia, unspecified: Secondary | ICD-10-CM

## 2021-03-20 DIAGNOSIS — R053 Chronic cough: Secondary | ICD-10-CM

## 2021-03-20 DIAGNOSIS — K219 Gastro-esophageal reflux disease without esophagitis: Secondary | ICD-10-CM | POA: Insufficient documentation

## 2021-03-20 DIAGNOSIS — K581 Irritable bowel syndrome with constipation: Secondary | ICD-10-CM

## 2021-03-20 IMAGING — RF DG SWALLOWING FUNCTION
7 series · 24 of 24 positions shown · non-contrast
Comparison: None.

CLINICAL DATA: Dysphagia. Cough/GE reflux disease/other secondary
diagnosis

EXAM:
MODIFIED BARIUM SWALLOW
TECHNIQUE: Different consistencies of barium were administered orally to the
patient by the Speech Pathologist. Imaging of the pharynx was
performed in the lateral projection. The radiologist was present in
the fluoroscopy room for this study, providing personal supervision.
FLUOROSCOPY TIME:  Fluoroscopy Time:  1 minutes 42 seconds
Radiation Exposure Index (if provided by the fluoroscopic device):
5.4 mGy
Number of Acquired Spot Images: 0

[Series 1: cp_standard · 0.51mm/px · 3 of 254 frames shown (1 of 7)]
[frame 39/254]
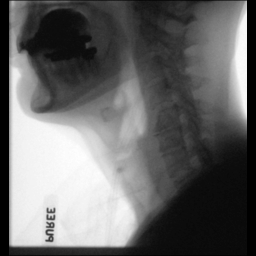
[frame 128/254]
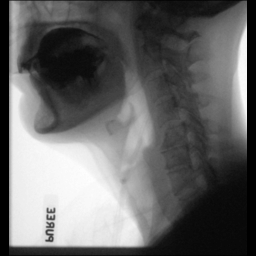
[frame 216/254]
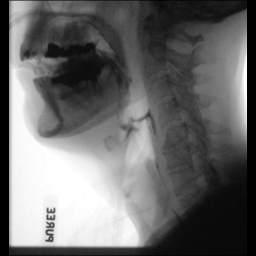

[Series 2: cp_standard · 0.51mm/px · 4 of 745 frames shown (2 of 7)]
[frame 112/745]
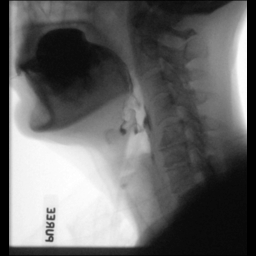
[frame 237/745]
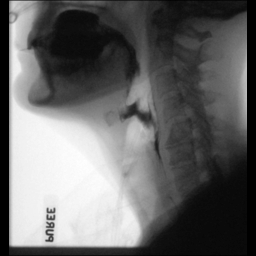
[frame 373/745]
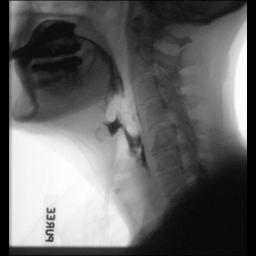
[frame 634/745]
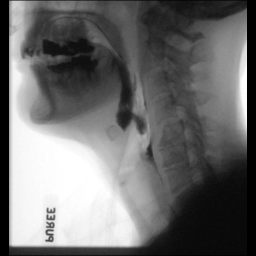

[Series 3: cp_standard · 0.51mm/px · 3 of 200 frames shown (3 of 7)]
[frame 31/200]
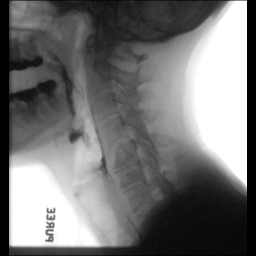
[frame 101/200]
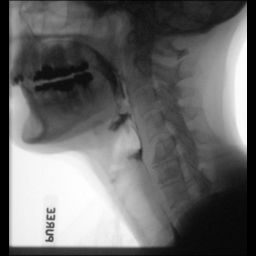
[frame 171/200]
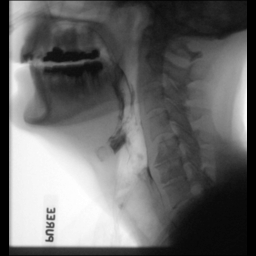

[Series 4: cp_standard · 0.51mm/px · 4 of 227 frames shown (4 of 7)]
[frame 35/227]
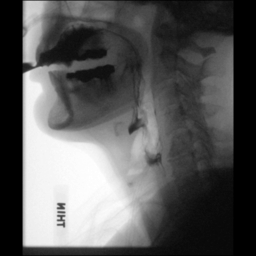
[frame 100/227]
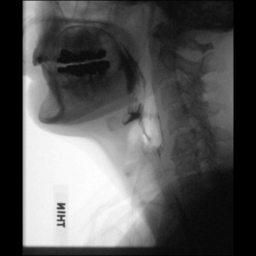
[frame 114/227]
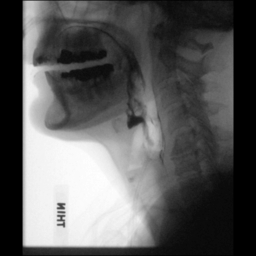
[frame 193/227]
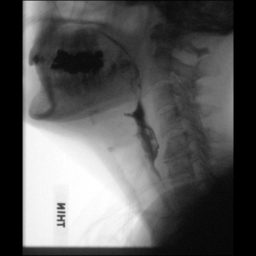

[Series 5: cp_standard · 0.51mm/px · 3 of 470 frames shown (5 of 7)]
[frame 71/470]
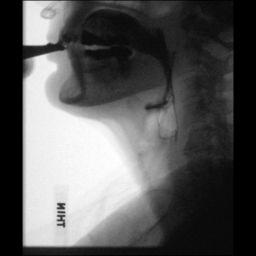
[frame 239/470]
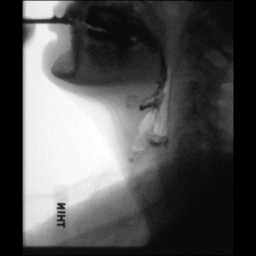
[frame 400/470]
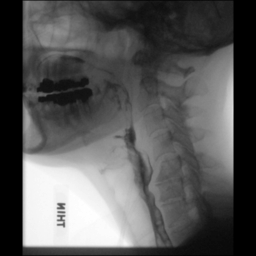

[Series 6: cp_standard · 0.51mm/px · 4 of 716 frames shown (6 of 7)]
[frame 108/716]
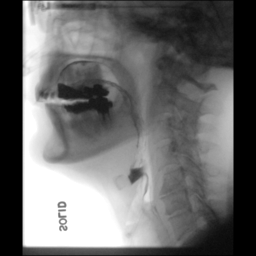
[frame 228/716]
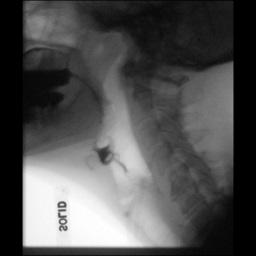
[frame 359/716]
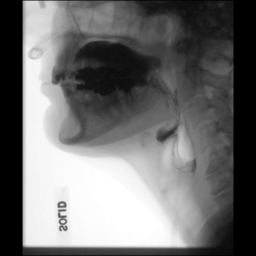
[frame 609/716]
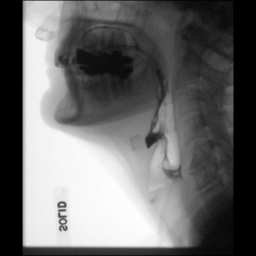

[Series 7: cp_standard · 0.51mm/px · 3 of 136 frames shown (7 of 7)]
[frame 59/136]
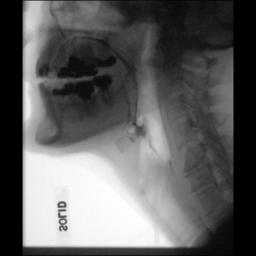
[frame 69/136]
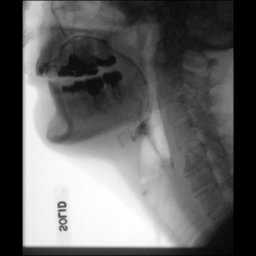
[frame 116/136]
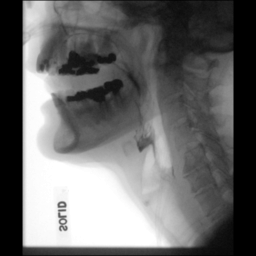

[24 of 24 positions shown; findings below may reference images not displayed]

FINDINGS: Mild residuals with thin and thick consistency.  No aspiration
IMPRESSION: No aspiration.

Please refer to the Speech Pathologists report for complete details
and recommendations.

## 2021-03-20 IMAGING — RF DG ESOPHAGUS
8 series · 12 of 19 positions shown · non-contrast
Comparison: None.

CLINICAL DATA: Dysphagia.  History of chronic bronchiectasis.

EXAM:
ESOPHOGRAM / BARIUM SWALLOW / BARIUM TABLET STUDY
TECHNIQUE: Single contrast examination performed using thick barium liquid and
thin barium liquid. The patient was observed with fluoroscopy
swallowing a 13 mm barium sulphate tablet.
FLUOROSCOPY TIME:  Fluoroscopy Time:  2 minutes 18 seconds
Radiation Exposure Index (if provided by the fluoroscopic device):
23.7 mGy
Number of Acquired Spot Images: 7

[Series 8: cp_standard · 0.52mm/px · 3 of 75 frames shown (1 of 2)]
[frame 12/75]
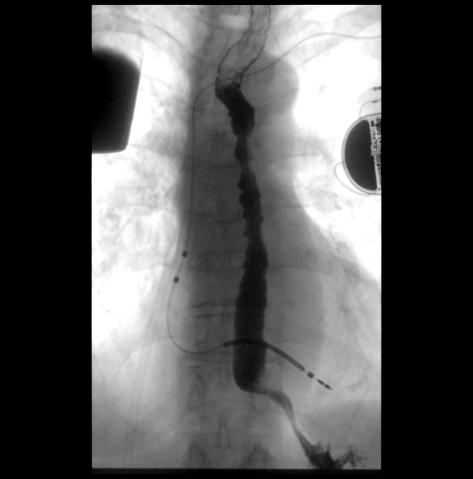
[frame 53/75]
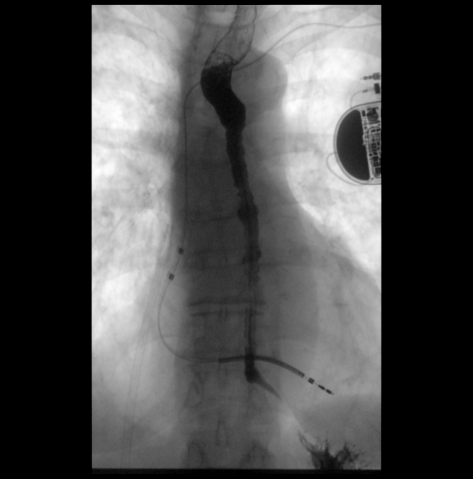
[frame 64/75]
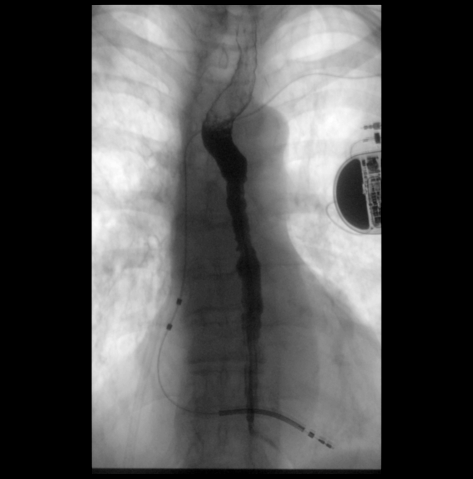

[Series 9: fluoro_barium 2fps_bw · 0.17mm/px · 1 of 2 frames shown (1 of 6)]
[frame 2/2]
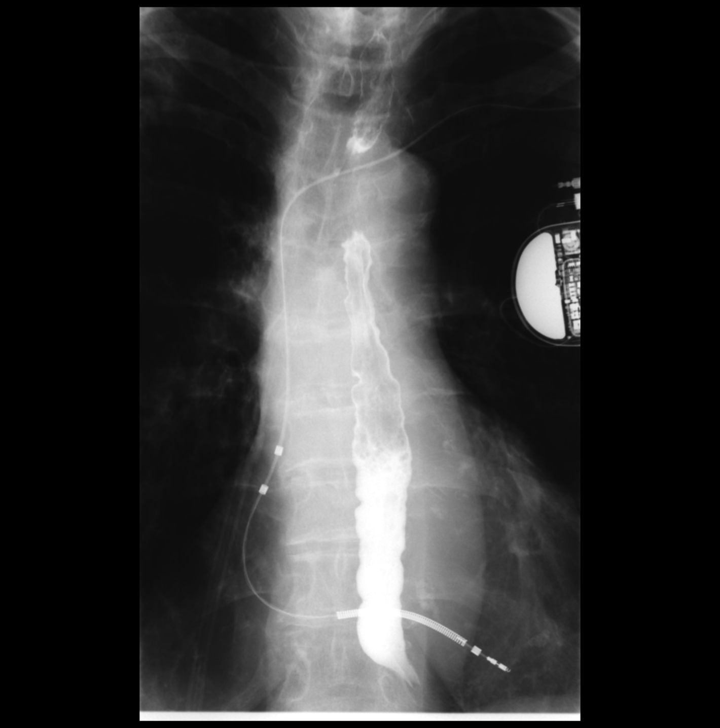

[Series 10: fluoro_barium 2fps_bw · 0.17mm/px · 1 of 2 frames shown (2 of 6)]
[frame 2/2]
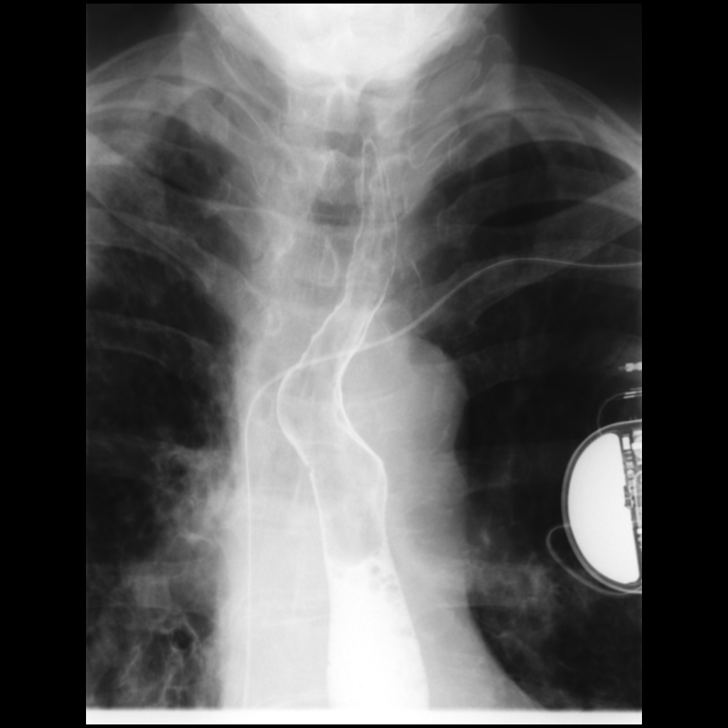

[Series 11: fluoro_barium 2fps_bw · 0.17mm/px · 2 of 5 frames shown (3 of 6)]
[frame 1/5]
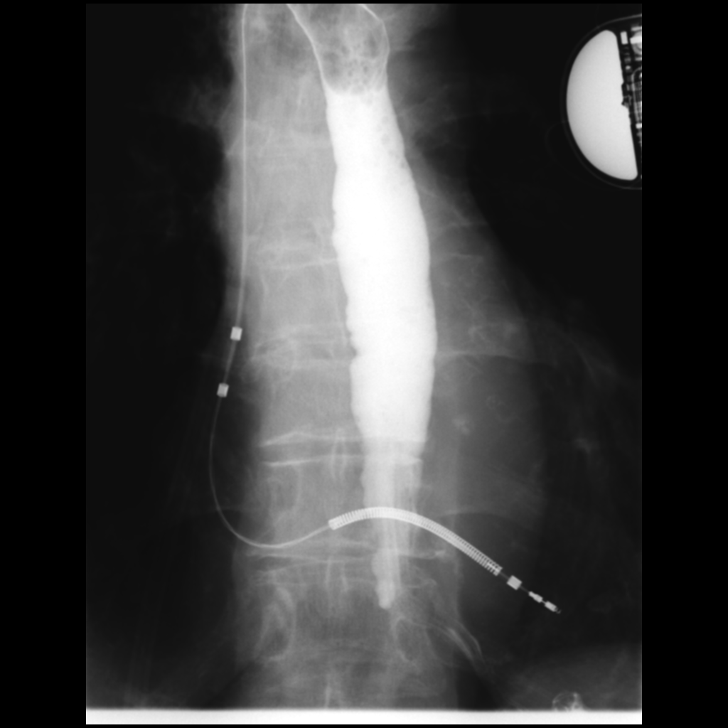
[frame 5/5]
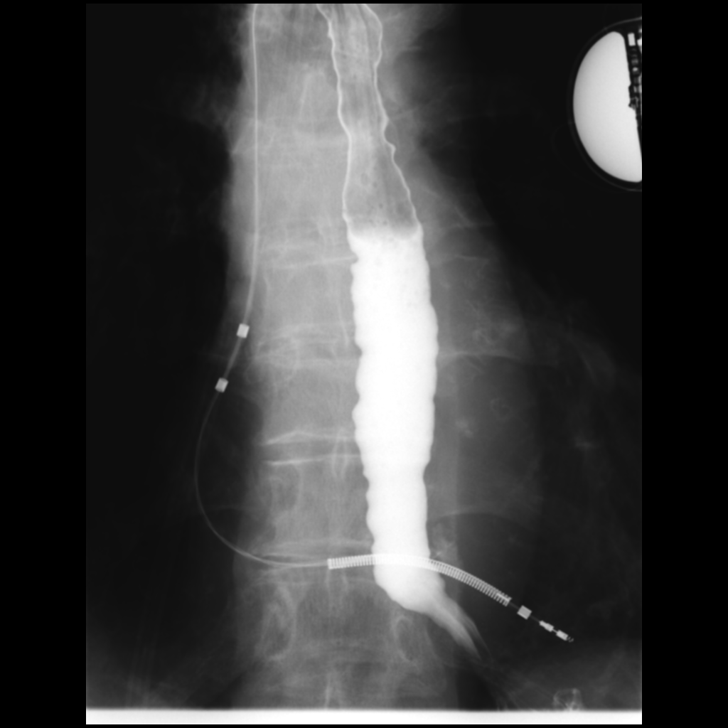

[Series 12: fluoro_barium 2fps_bw · 0.17mm/px · 1 of 2 frames shown (4 of 6)]
[frame 1/2]
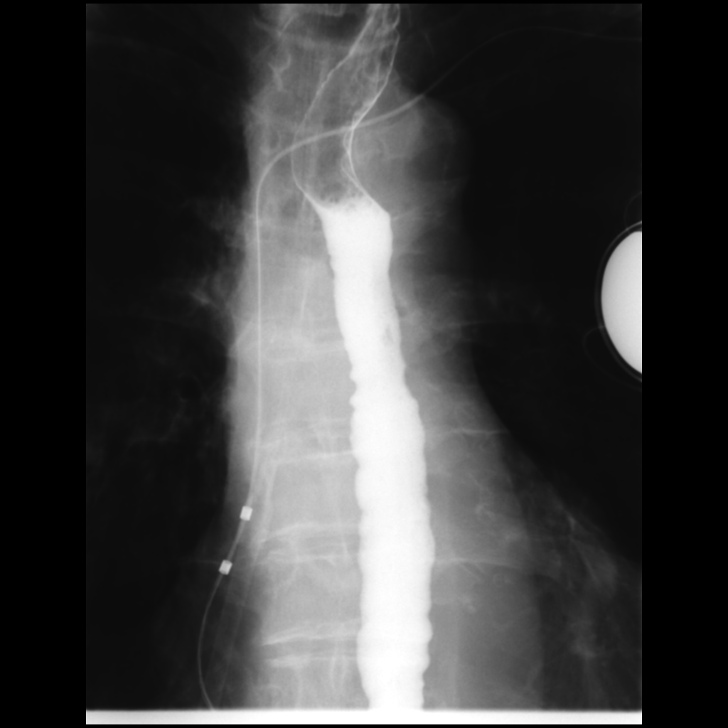

[Series 13: fluoro_barium 2fps_bw · 0.17mm/px · 1 of 2 frames shown (5 of 6)]
[frame 1/2]
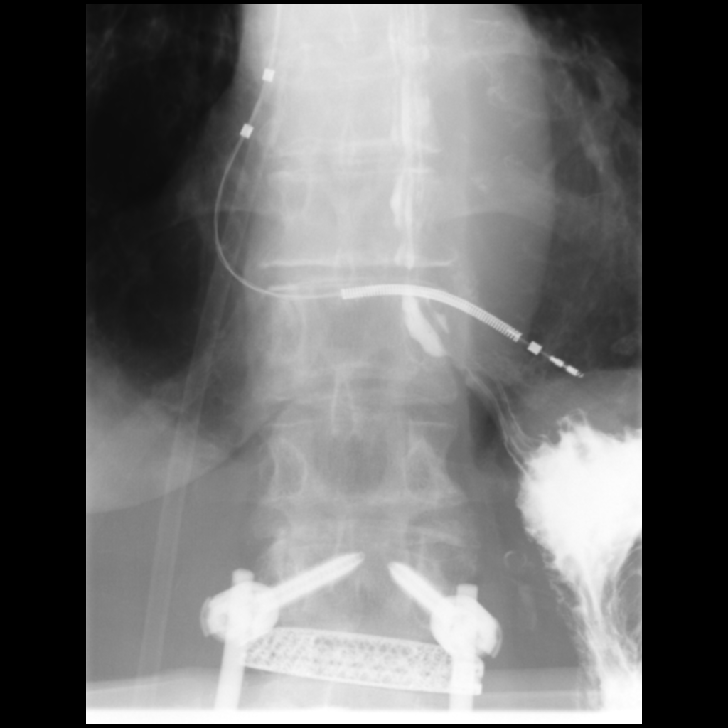

[Series 14: fluoro_barium 2fps_bw · 0.17mm/px · 2 of 5 frames shown (6 of 6)]
[frame 1/5]
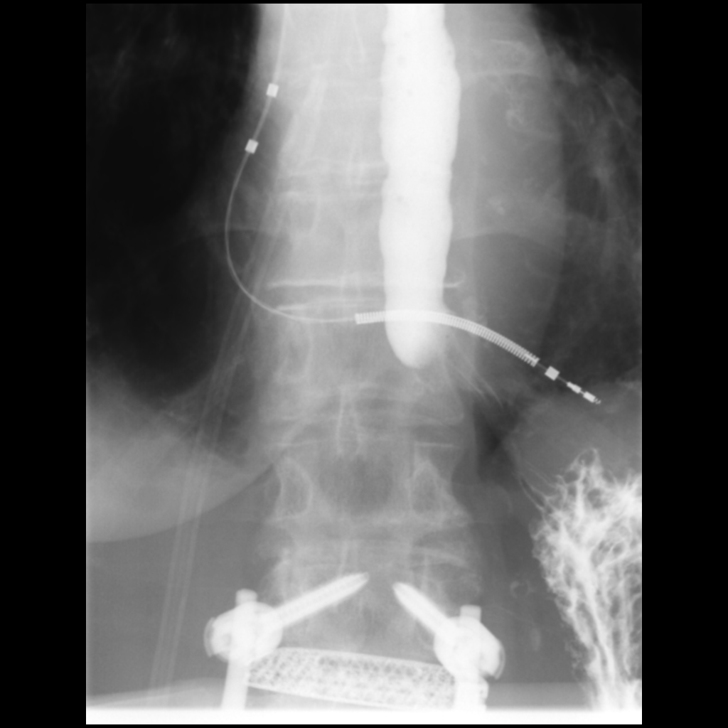
[frame 3/5]
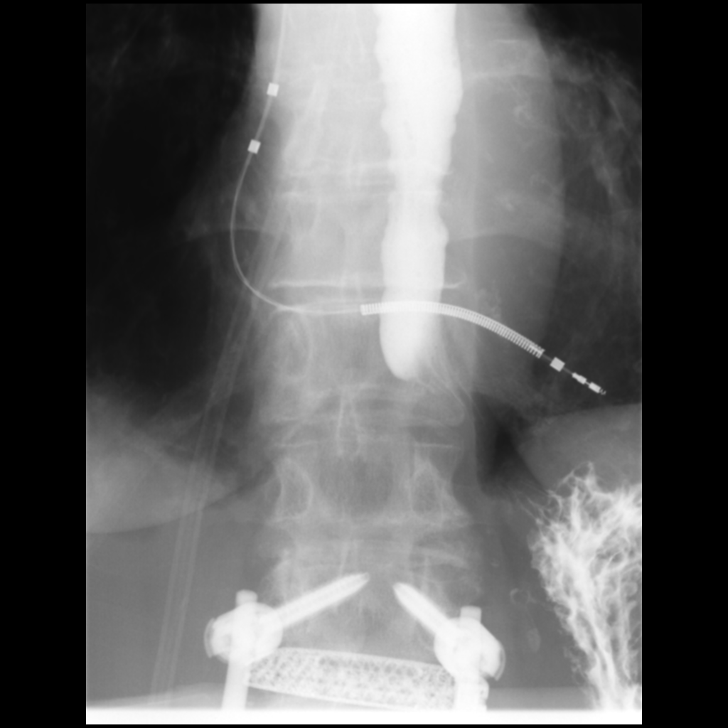

[Series 15: cp_standard · 0.27mm/px · 1 of 1 slices shown (2 of 2)]
[im 1/1]
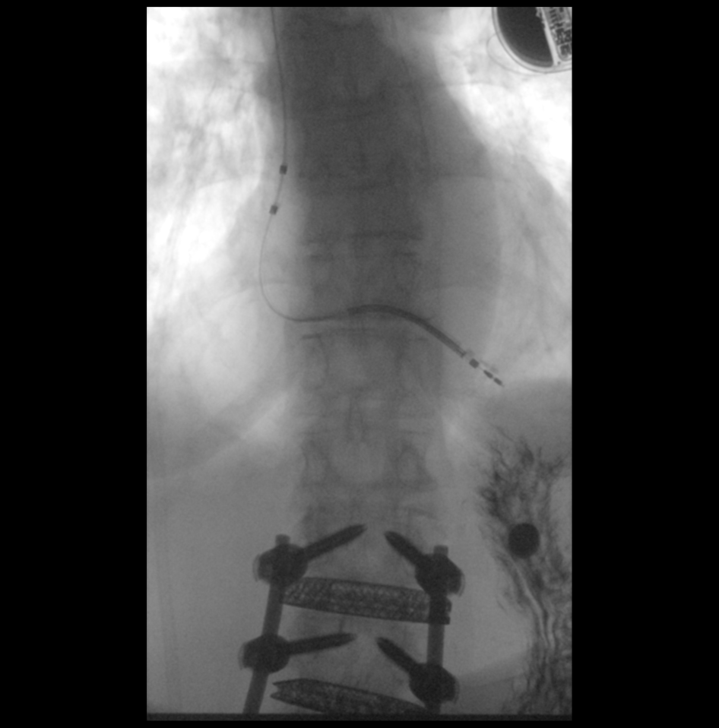

[12 of 19 positions shown; findings below may reference images not displayed]

FINDINGS: Exam is suboptimal due to patient limited mobility.

No mucosal irregularity, stricture, or mass identified within the
esophagus. There is poor initiation of the primary stripping wave
with to and fro motion of the barium bolus within the esophagus. No
tertiary contractions.

No gastroesophageal reflux demonstrated to the course exam.

13 mm barium tab passed GE junction
IMPRESSION: 1. No stricture or mass in the esophagus.
2. Moderate esophageal dysmotility wioth poor initiation of
stripping wave.
3. Barium tab passed GE junction.

## 2021-03-20 NOTE — Progress Notes (Signed)
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[place])    Pseudomonas respiratory infection    Thyroid disease    Past Surgical History:  Past Surgical History:  Procedure Laterality Date   ABDOMINAL HYSTERECTOMY     BRONCHOSCOPY  2001   CARDIAC SURGERY     COLONOSCOPY  2008   CYST REMOVAL TRUNK  1965   Left axilla; benign   DILATION AND CURETTAGE OF UTERUS  1975   HAND SURGERY Right 1983   ICD IMPLANT Left 02/01/2020   PATENT DUCTUS ARTERIOUS REPAIR  1952   RIGHT HEART CATH N/A 03/23/2020   Procedure: RIGHT HEART CATH;  Surgeon: Jolaine Artist, MD;  Location: [facility];  Service: Cardiovascular;  Laterality: N/A;   TONSILLECTOMY  1956   VARICOSE VEIN SURGERY  1972   HPI: Stacy Sanchez is a 75 y.o. female who was referred by Dr. Silverio Decamp, GI, for an OP MBS.  PMHx significant for MAI, bronchiectasis, respiratory failure on continuous oxygen, OSA on CPAP, severe CHF s/p ICD with complaints of worsening dysphagia and constipation.  Pt struggles to swallow any large pills. Occasionally she has trouble swallowing liquids and food. She also has coughing episodes when she tries to swallow. She has had significant weight loss ~30 pounds in the past year, anorexia, early satiety and dyspepsia /epigastric discomfort.  Today she additionally reports xerostomia and localizes pill dysphagia specifically to thyroid area. She reports heavy mucus production. Plan is for MBS, barium esophagram and EGD if warranted.    Subjective: good historian    Recommendations for follow up therapy are one component of a multi-disciplinary discharge planning process, led by the attending physician.  Recommendations may be updated based on patient status, additional functional criteria and insurance authorization.  Assessment / Plan / Recommendation  Clinical Impressions 03/20/2021  Clinical Impression Pt presents with unremarkable oropharyngeal swallow.  There was perhaps mildly prolonged mastication/bolus cohesion of solids, potentially attributable to xerostomia.  There was mild vallecular residue throughout the study, regardless of consistency, suggesting decreased base of tongue/pharyngeal wall contact.  There was one incident of penetration of thin liquids into the vestibule (PAS score of 3 - enters larynx but not viewed to be expelled; not aspirated). There was no aspiration.  Viewed video in real time with pt then reviewed again frame-by-frame for documentation purposes.  Discussed presence of vallecular residue as indicative of possible location of pill lodging (f/u esophagram should provided more information).  Pt can crush meds and take with puree to help. Xerostomia may also interfere with bolus cohesion/transfer - encouraged Ms Casstevens to talk with MD about meds that may be contributing to dry mouth.  Recommend continuing regular solids; thin liquids. Answered Ms. Lockamy questions to best of my ability.  SLP Visit Diagnosis Dysphagia, unspecified (R13.10)  Attention and concentration deficit following --  Frontal lobe and executive function deficit following --  Impact on safety and function No limitations      Treatment Recommendations 03/20/2021  Treatment Recommendations No treatment recommended at  this time     No flowsheet data found.  Diet Recommendations 03/20/2021  SLP Diet Recommendations Regular solids;Thin liquid  Liquid Administration via Cup;Straw  Medication Administration Crushed with puree   Compensations --  Postural Changes --      Other Recommendations 03/20/2021  Recommended Consults --  Oral Care Recommendations Oral care BID  Other Recommendations --  Follow Up Recommendations No SLP follow up  Assistance recommended at discharge --  Functional Status Assessment --             No flowsheet data found.   Juan Quam Laurice 03/20/2021, 2:26 PM

## 2021-03-29 ENCOUNTER — Encounter: Payer: Self-pay | Admitting: Gastroenterology

## 2021-03-30 ENCOUNTER — Ambulatory Visit: Payer: Medicare HMO | Admitting: Gastroenterology

## 2021-04-03 ENCOUNTER — Ambulatory Visit: Payer: Medicare HMO | Admitting: Physician Assistant

## 2021-04-06 ENCOUNTER — Encounter (HOSPITAL_BASED_OUTPATIENT_CLINIC_OR_DEPARTMENT_OTHER): Payer: Self-pay | Admitting: Emergency Medicine

## 2021-04-06 ENCOUNTER — Emergency Department (HOSPITAL_BASED_OUTPATIENT_CLINIC_OR_DEPARTMENT_OTHER): Payer: Medicare HMO

## 2021-04-06 ENCOUNTER — Other Ambulatory Visit: Payer: Self-pay

## 2021-04-06 ENCOUNTER — Inpatient Hospital Stay (HOSPITAL_BASED_OUTPATIENT_CLINIC_OR_DEPARTMENT_OTHER)
Admission: EM | Admit: 2021-04-06 | Discharge: 2021-04-22 | DRG: 640 | Disposition: E | Payer: Medicare HMO | Attending: Internal Medicine | Admitting: Internal Medicine

## 2021-04-06 DIAGNOSIS — F411 Generalized anxiety disorder: Secondary | ICD-10-CM | POA: Diagnosis not present

## 2021-04-06 DIAGNOSIS — K224 Dyskinesia of esophagus: Secondary | ICD-10-CM | POA: Diagnosis present

## 2021-04-06 DIAGNOSIS — Z881 Allergy status to other antibiotic agents status: Secondary | ICD-10-CM

## 2021-04-06 DIAGNOSIS — N179 Acute kidney failure, unspecified: Secondary | ICD-10-CM | POA: Diagnosis present

## 2021-04-06 DIAGNOSIS — Z681 Body mass index (BMI) 19 or less, adult: Secondary | ICD-10-CM | POA: Diagnosis not present

## 2021-04-06 DIAGNOSIS — Z888 Allergy status to other drugs, medicaments and biological substances status: Secondary | ICD-10-CM

## 2021-04-06 DIAGNOSIS — I130 Hypertensive heart and chronic kidney disease with heart failure and stage 1 through stage 4 chronic kidney disease, or unspecified chronic kidney disease: Secondary | ICD-10-CM | POA: Diagnosis present

## 2021-04-06 DIAGNOSIS — R1010 Upper abdominal pain, unspecified: Secondary | ICD-10-CM

## 2021-04-06 DIAGNOSIS — Z515 Encounter for palliative care: Secondary | ICD-10-CM | POA: Diagnosis not present

## 2021-04-06 DIAGNOSIS — E039 Hypothyroidism, unspecified: Secondary | ICD-10-CM | POA: Diagnosis present

## 2021-04-06 DIAGNOSIS — R634 Abnormal weight loss: Secondary | ICD-10-CM | POA: Diagnosis not present

## 2021-04-06 DIAGNOSIS — E785 Hyperlipidemia, unspecified: Secondary | ICD-10-CM | POA: Diagnosis present

## 2021-04-06 DIAGNOSIS — R4701 Aphasia: Secondary | ICD-10-CM | POA: Diagnosis not present

## 2021-04-06 DIAGNOSIS — I700 Atherosclerosis of aorta: Secondary | ICD-10-CM | POA: Diagnosis present

## 2021-04-06 DIAGNOSIS — Z9989 Dependence on other enabling machines and devices: Secondary | ICD-10-CM | POA: Diagnosis not present

## 2021-04-06 DIAGNOSIS — G9340 Encephalopathy, unspecified: Secondary | ICD-10-CM | POA: Diagnosis not present

## 2021-04-06 DIAGNOSIS — Z9981 Dependence on supplemental oxygen: Secondary | ICD-10-CM

## 2021-04-06 DIAGNOSIS — Z90710 Acquired absence of both cervix and uterus: Secondary | ICD-10-CM

## 2021-04-06 DIAGNOSIS — K5909 Other constipation: Secondary | ICD-10-CM | POA: Diagnosis present

## 2021-04-06 DIAGNOSIS — R339 Retention of urine, unspecified: Secondary | ICD-10-CM | POA: Diagnosis present

## 2021-04-06 DIAGNOSIS — F324 Major depressive disorder, single episode, in partial remission: Secondary | ICD-10-CM | POA: Diagnosis present

## 2021-04-06 DIAGNOSIS — Z66 Do not resuscitate: Secondary | ICD-10-CM | POA: Diagnosis not present

## 2021-04-06 DIAGNOSIS — R404 Transient alteration of awareness: Secondary | ICD-10-CM | POA: Diagnosis not present

## 2021-04-06 DIAGNOSIS — E875 Hyperkalemia: Secondary | ICD-10-CM | POA: Diagnosis not present

## 2021-04-06 DIAGNOSIS — E860 Dehydration: Secondary | ICD-10-CM | POA: Diagnosis present

## 2021-04-06 DIAGNOSIS — I493 Ventricular premature depolarization: Secondary | ICD-10-CM | POA: Diagnosis not present

## 2021-04-06 DIAGNOSIS — J9611 Chronic respiratory failure with hypoxia: Secondary | ICD-10-CM | POA: Diagnosis present

## 2021-04-06 DIAGNOSIS — R0602 Shortness of breath: Secondary | ICD-10-CM

## 2021-04-06 DIAGNOSIS — Z961 Presence of intraocular lens: Secondary | ICD-10-CM | POA: Diagnosis present

## 2021-04-06 DIAGNOSIS — G8194 Hemiplegia, unspecified affecting left nondominant side: Secondary | ICD-10-CM | POA: Diagnosis not present

## 2021-04-06 DIAGNOSIS — Z79890 Hormone replacement therapy (postmenopausal): Secondary | ICD-10-CM

## 2021-04-06 DIAGNOSIS — G9341 Metabolic encephalopathy: Secondary | ICD-10-CM | POA: Diagnosis present

## 2021-04-06 DIAGNOSIS — Z79899 Other long term (current) drug therapy: Secondary | ICD-10-CM

## 2021-04-06 DIAGNOSIS — G4733 Obstructive sleep apnea (adult) (pediatric): Secondary | ICD-10-CM | POA: Diagnosis not present

## 2021-04-06 DIAGNOSIS — I428 Other cardiomyopathies: Secondary | ICD-10-CM | POA: Diagnosis present

## 2021-04-06 DIAGNOSIS — Z91199 Patient's noncompliance with other medical treatment and regimen due to unspecified reason: Secondary | ICD-10-CM

## 2021-04-06 DIAGNOSIS — R64 Cachexia: Secondary | ICD-10-CM | POA: Diagnosis present

## 2021-04-06 DIAGNOSIS — R29707 NIHSS score 7: Secondary | ICD-10-CM | POA: Diagnosis not present

## 2021-04-06 DIAGNOSIS — N1832 Chronic kidney disease, stage 3b: Secondary | ICD-10-CM | POA: Diagnosis present

## 2021-04-06 DIAGNOSIS — F0393 Unspecified dementia, unspecified severity, with mood disturbance: Secondary | ICD-10-CM | POA: Diagnosis present

## 2021-04-06 DIAGNOSIS — E43 Unspecified severe protein-calorie malnutrition: Secondary | ICD-10-CM | POA: Diagnosis present

## 2021-04-06 DIAGNOSIS — J189 Pneumonia, unspecified organism: Secondary | ICD-10-CM | POA: Diagnosis present

## 2021-04-06 DIAGNOSIS — R778 Other specified abnormalities of plasma proteins: Secondary | ICD-10-CM | POA: Diagnosis not present

## 2021-04-06 DIAGNOSIS — F05 Delirium due to known physiological condition: Secondary | ICD-10-CM | POA: Diagnosis not present

## 2021-04-06 DIAGNOSIS — I6389 Other cerebral infarction: Secondary | ICD-10-CM | POA: Diagnosis not present

## 2021-04-06 DIAGNOSIS — I5022 Chronic systolic (congestive) heart failure: Secondary | ICD-10-CM | POA: Diagnosis present

## 2021-04-06 DIAGNOSIS — Z7189 Other specified counseling: Secondary | ICD-10-CM | POA: Diagnosis not present

## 2021-04-06 DIAGNOSIS — Z8673 Personal history of transient ischemic attack (TIA), and cerebral infarction without residual deficits: Secondary | ICD-10-CM

## 2021-04-06 DIAGNOSIS — J479 Bronchiectasis, uncomplicated: Secondary | ICD-10-CM | POA: Diagnosis not present

## 2021-04-06 DIAGNOSIS — R627 Adult failure to thrive: Secondary | ICD-10-CM | POA: Diagnosis present

## 2021-04-06 DIAGNOSIS — R9431 Abnormal electrocardiogram [ECG] [EKG]: Secondary | ICD-10-CM | POA: Diagnosis not present

## 2021-04-06 DIAGNOSIS — M1990 Unspecified osteoarthritis, unspecified site: Secondary | ICD-10-CM | POA: Diagnosis present

## 2021-04-06 DIAGNOSIS — R4182 Altered mental status, unspecified: Secondary | ICD-10-CM | POA: Diagnosis not present

## 2021-04-06 DIAGNOSIS — I10 Essential (primary) hypertension: Secondary | ICD-10-CM

## 2021-04-06 DIAGNOSIS — Z20822 Contact with and (suspected) exposure to covid-19: Secondary | ICD-10-CM | POA: Diagnosis present

## 2021-04-06 DIAGNOSIS — I2510 Atherosclerotic heart disease of native coronary artery without angina pectoris: Secondary | ICD-10-CM | POA: Diagnosis not present

## 2021-04-06 DIAGNOSIS — N183 Chronic kidney disease, stage 3 unspecified: Secondary | ICD-10-CM

## 2021-04-06 DIAGNOSIS — R413 Other amnesia: Secondary | ICD-10-CM

## 2021-04-06 DIAGNOSIS — R479 Unspecified speech disturbances: Secondary | ICD-10-CM | POA: Diagnosis not present

## 2021-04-06 DIAGNOSIS — Z7982 Long term (current) use of aspirin: Secondary | ICD-10-CM

## 2021-04-06 DIAGNOSIS — R109 Unspecified abdominal pain: Secondary | ICD-10-CM | POA: Diagnosis present

## 2021-04-06 DIAGNOSIS — R258 Other abnormal involuntary movements: Secondary | ICD-10-CM | POA: Diagnosis present

## 2021-04-06 DIAGNOSIS — Z86718 Personal history of other venous thrombosis and embolism: Secondary | ICD-10-CM

## 2021-04-06 DIAGNOSIS — I6340 Cerebral infarction due to embolism of unspecified cerebral artery: Secondary | ICD-10-CM | POA: Diagnosis not present

## 2021-04-06 DIAGNOSIS — G629 Polyneuropathy, unspecified: Secondary | ICD-10-CM | POA: Diagnosis present

## 2021-04-06 DIAGNOSIS — R1011 Right upper quadrant pain: Secondary | ICD-10-CM

## 2021-04-06 DIAGNOSIS — Z95810 Presence of automatic (implantable) cardiac defibrillator: Secondary | ICD-10-CM

## 2021-04-06 LAB — COMPREHENSIVE METABOLIC PANEL
ALT: 26 U/L (ref 0–44)
AST: 60 U/L — ABNORMAL HIGH (ref 15–41)
Albumin: 3.2 g/dL — ABNORMAL LOW (ref 3.5–5.0)
Alkaline Phosphatase: 144 U/L — ABNORMAL HIGH (ref 38–126)
Anion gap: 7 (ref 5–15)
BUN: 45 mg/dL — ABNORMAL HIGH (ref 8–23)
CO2: 36 mmol/L — ABNORMAL HIGH (ref 22–32)
Calcium: 9.4 mg/dL (ref 8.9–10.3)
Chloride: 95 mmol/L — ABNORMAL LOW (ref 98–111)
Creatinine, Ser: 1.37 mg/dL — ABNORMAL HIGH (ref 0.44–1.00)
GFR, Estimated: 40 mL/min — ABNORMAL LOW (ref >60)
Glucose, Bld: 131 mg/dL — ABNORMAL HIGH (ref 70–99)
Potassium: 6 mmol/L — ABNORMAL HIGH (ref 3.5–5.1)
Sodium: 138 mmol/L (ref 135–145)
Total Bilirubin: 0.8 mg/dL (ref 0.3–1.2)
Total Protein: 7.7 g/dL (ref 6.5–8.1)

## 2021-04-06 LAB — BASIC METABOLIC PANEL
Anion gap: 6 (ref 5–15)
BUN: 47 mg/dL — ABNORMAL HIGH (ref 8–23)
CO2: 40 mmol/L — ABNORMAL HIGH (ref 22–32)
Calcium: 9 mg/dL (ref 8.9–10.3)
Chloride: 95 mmol/L — ABNORMAL LOW (ref 98–111)
Creatinine, Ser: 1.47 mg/dL — ABNORMAL HIGH (ref 0.44–1.00)
GFR, Estimated: 37 mL/min — ABNORMAL LOW (ref >60)
Glucose, Bld: 104 mg/dL — ABNORMAL HIGH (ref 70–99)
Potassium: 6 mmol/L — ABNORMAL HIGH (ref 3.5–5.1)
Sodium: 141 mmol/L (ref 135–145)

## 2021-04-06 LAB — CBC WITH DIFFERENTIAL/PLATELET
Abs Immature Granulocytes: 0.04 10*3/uL (ref 0.00–0.07)
Basophils Absolute: 0.1 10*3/uL (ref 0.0–0.1)
Basophils Relative: 1 %
Eosinophils Absolute: 0 10*3/uL (ref 0.0–0.5)
Eosinophils Relative: 0 %
HCT: 46.9 % — ABNORMAL HIGH (ref 36.0–46.0)
Hemoglobin: 13.9 g/dL (ref 12.0–15.0)
Immature Granulocytes: 0 %
Lymphocytes Relative: 14 %
Lymphs Abs: 1.7 10*3/uL (ref 0.7–4.0)
MCH: 30 pg (ref 26.0–34.0)
MCHC: 29.6 g/dL — ABNORMAL LOW (ref 30.0–36.0)
MCV: 101.1 fL — ABNORMAL HIGH (ref 80.0–100.0)
Monocytes Absolute: 0.7 10*3/uL (ref 0.1–1.0)
Monocytes Relative: 5 %
Neutro Abs: 9.7 10*3/uL — ABNORMAL HIGH (ref 1.7–7.7)
Neutrophils Relative %: 80 %
Platelets: 319 10*3/uL (ref 150–400)
RBC: 4.64 MIL/uL (ref 3.87–5.11)
RDW: 16 % — ABNORMAL HIGH (ref 11.5–15.5)
WBC: 12.3 10*3/uL — ABNORMAL HIGH (ref 4.0–10.5)
nRBC: 0 % (ref 0.0–0.2)

## 2021-04-06 LAB — MAGNESIUM: Magnesium: 2 mg/dL (ref 1.7–2.4)

## 2021-04-06 LAB — LACTIC ACID, PLASMA
Lactic Acid, Venous: 1.6 mmol/L (ref 0.5–1.9)
Lactic Acid, Venous: 1.5 mmol/L (ref 0.5–1.9)

## 2021-04-06 LAB — RESP PANEL BY RT-PCR (FLU A&B, COVID) ARPGX2
Influenza A by PCR: NEGATIVE
Influenza B by PCR: NEGATIVE
SARS Coronavirus 2 by RT PCR: NEGATIVE

## 2021-04-06 LAB — TROPONIN I (HIGH SENSITIVITY): Troponin I (High Sensitivity): 86 ng/L — ABNORMAL HIGH (ref <18)

## 2021-04-06 LAB — BRAIN NATRIURETIC PEPTIDE: B Natriuretic Peptide: 1633 pg/mL — ABNORMAL HIGH (ref 0.0–100.0)

## 2021-04-06 LAB — LIPASE, BLOOD: Lipase: <10 U/L — ABNORMAL LOW (ref 11–51)

## 2021-04-06 LAB — TSH: TSH: 4.257 u[IU]/mL (ref 0.350–4.500)

## 2021-04-06 LAB — POTASSIUM: Potassium: 5.6 mmol/L — ABNORMAL HIGH (ref 3.5–5.1)

## 2021-04-06 LAB — PHOSPHORUS: Phosphorus: 7.5 mg/dL — ABNORMAL HIGH (ref 2.5–4.6)

## 2021-04-06 IMAGING — DX DG CHEST 1V PORT
1 series · 1 of 1 positions shown · non-contrast
Comparison: CT examination and chest radiograph dated [DATE].

CLINICAL DATA: Weakness, history of CHF

EXAM:
PORTABLE CHEST 1 VIEW

[chest]
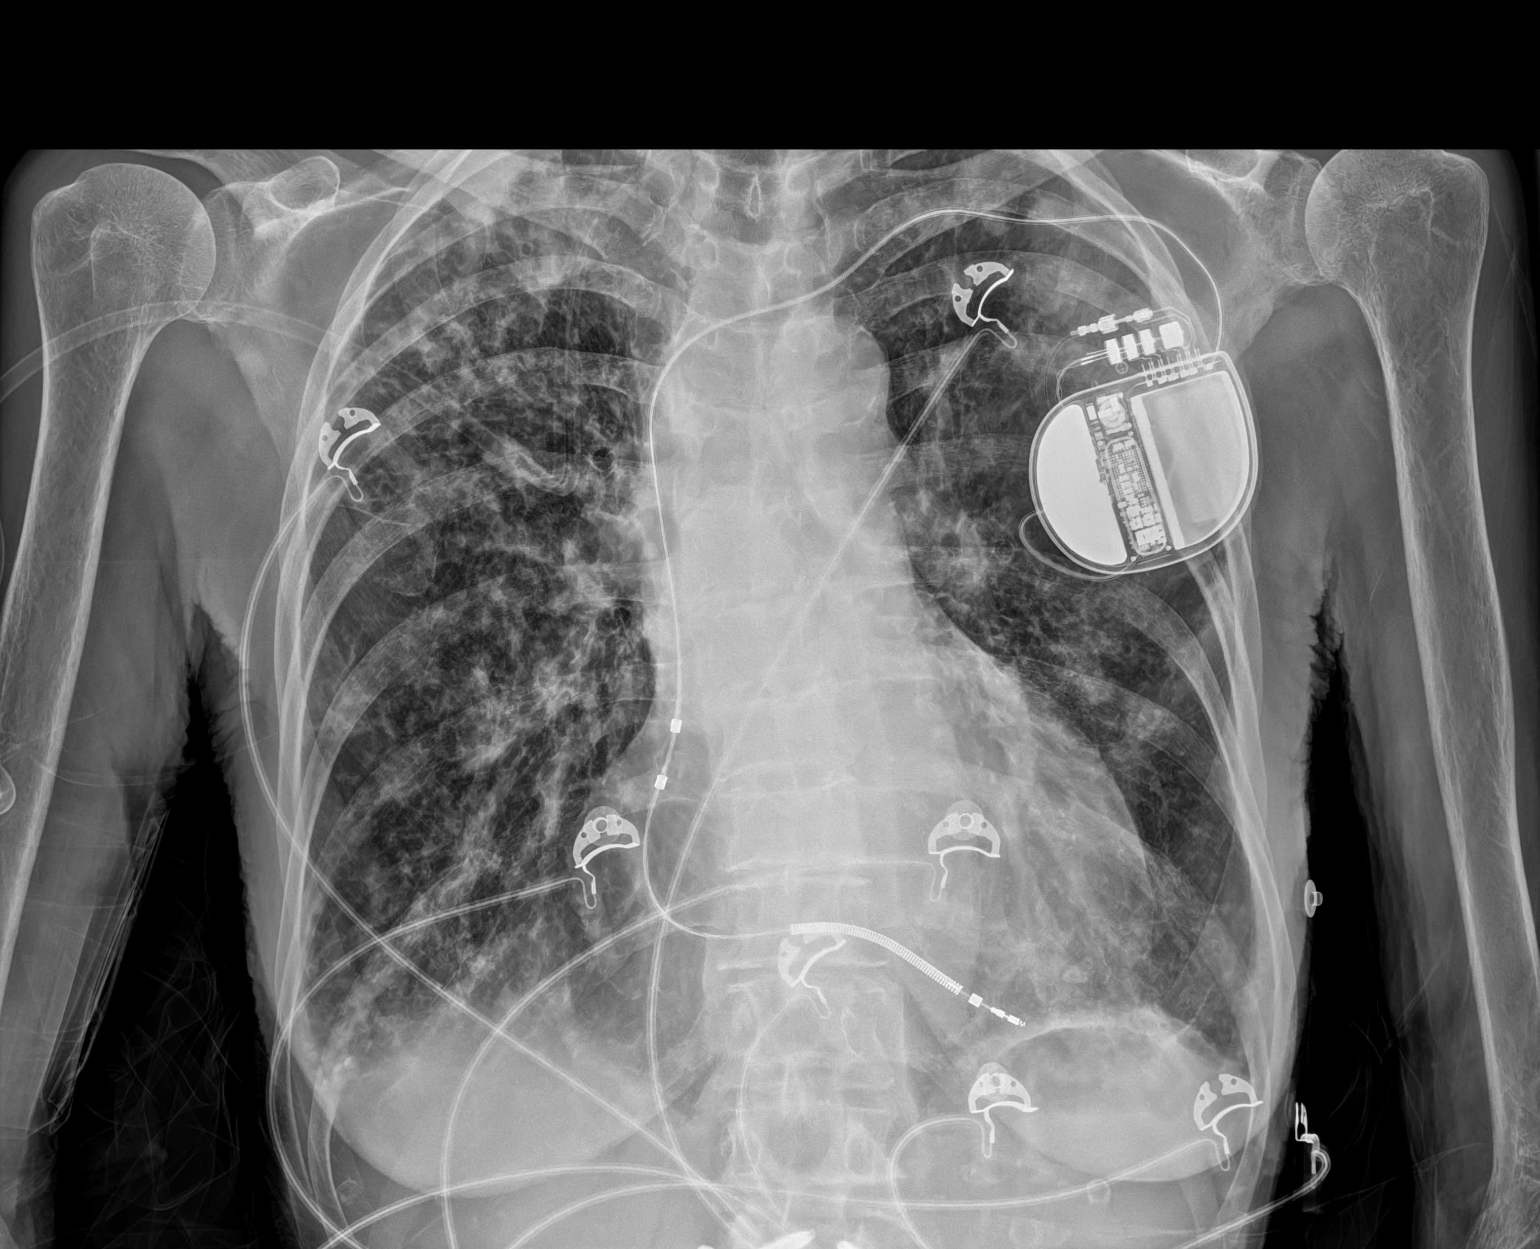

[1 of 1 positions shown; findings below may reference images not displayed]

FINDINGS: The heart is mildly enlarged. Single lead AICD in place. Biapical
pleural/parenchymal scarring. Bilateral prominent interstitial
marking consistent with prominent bronchiectasis, unchanged. Stable
bibasilar atelectasis/mild fibrotic changes.
IMPRESSION: 1. Prominent bilateral interstitial markings TUDUNWADA system with
bronchiectasis with mild basilar atelectasis, unchanged. Acute
airspace disease can not be completely excluded. No large pleural
effusion.

2.  Stable mild cardiomegaly with AICD.  No evidence of pulmonary

## 2021-04-06 IMAGING — US US ABDOMEN LIMITED
1 series · 13 of 25 positions shown · non-contrast
Comparison: None.

CLINICAL DATA: Right upper quadrant pain

EXAM:
ULTRASOUND ABDOMEN LIMITED RIGHT UPPER QUADRANT

[Series 1: us abdomen limited ruq (liver/gb) · 13 of 88 slices shown]
[im 1/88]
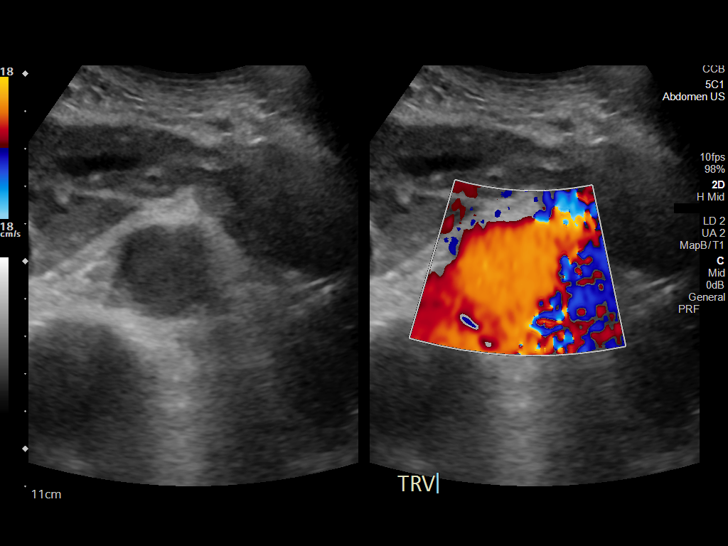
[im 8/88]
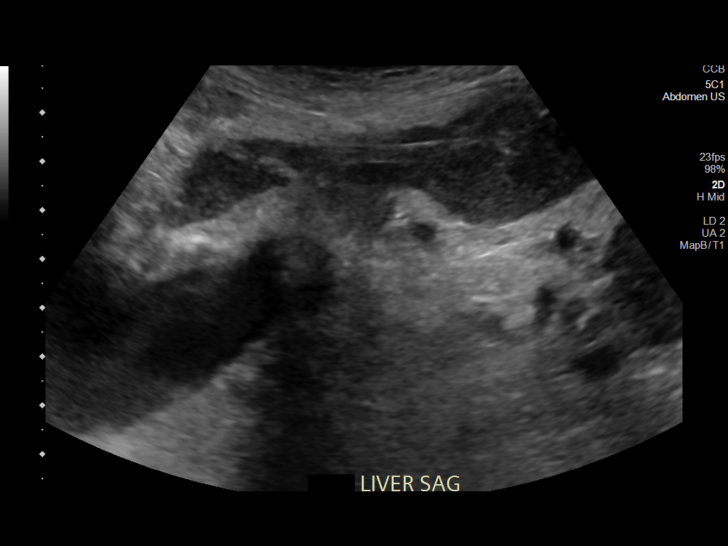
[im 15/88]
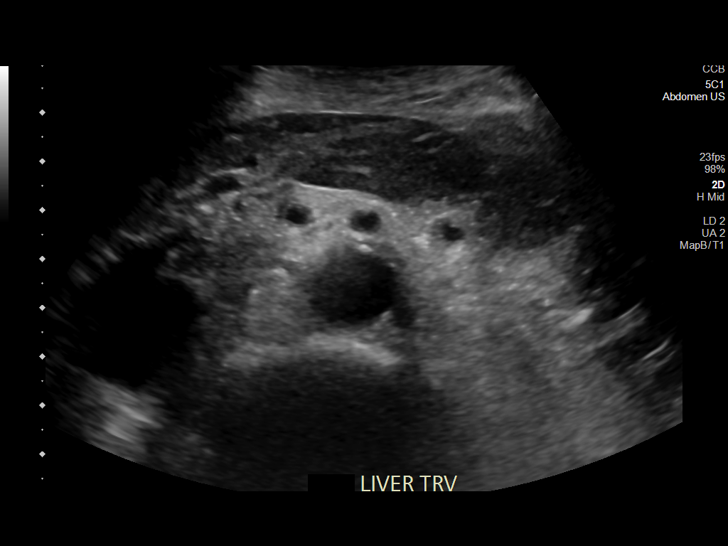
[im 22/88]
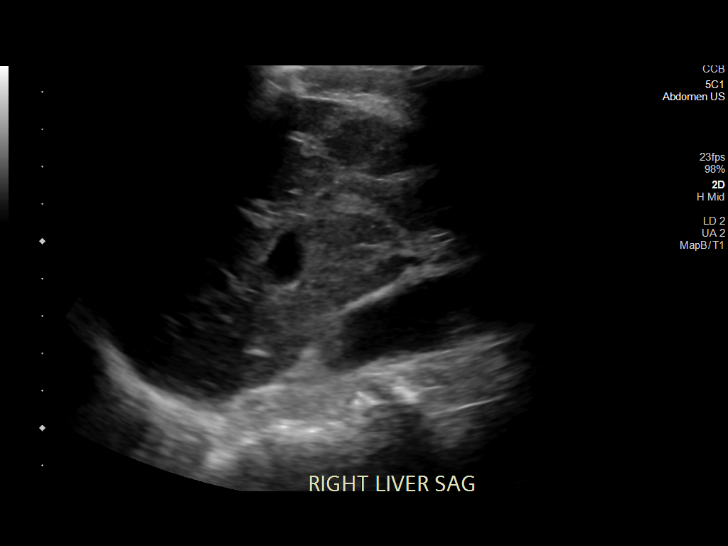
[im 30/88]
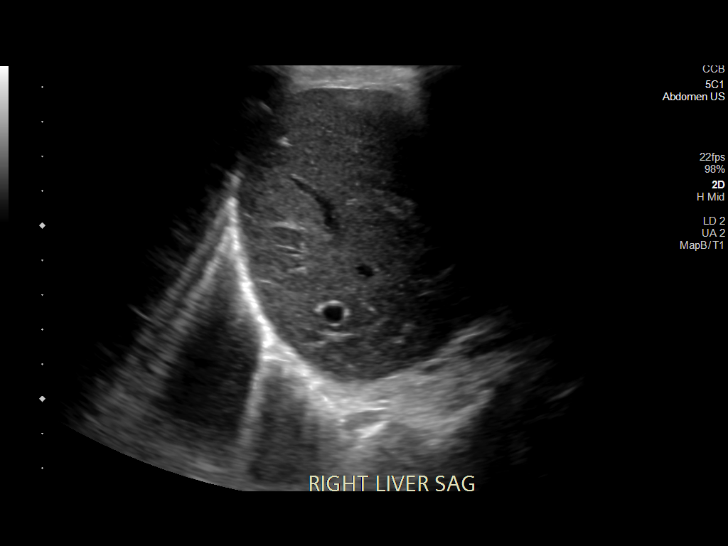
[im 37/88]
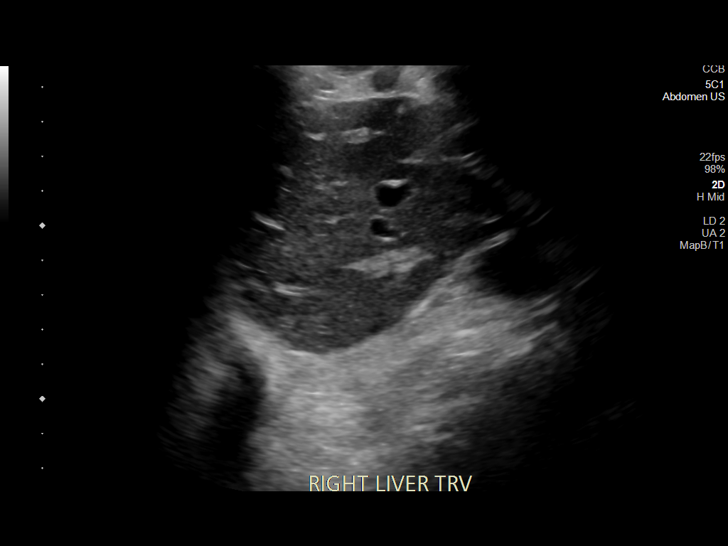
[im 44/88]
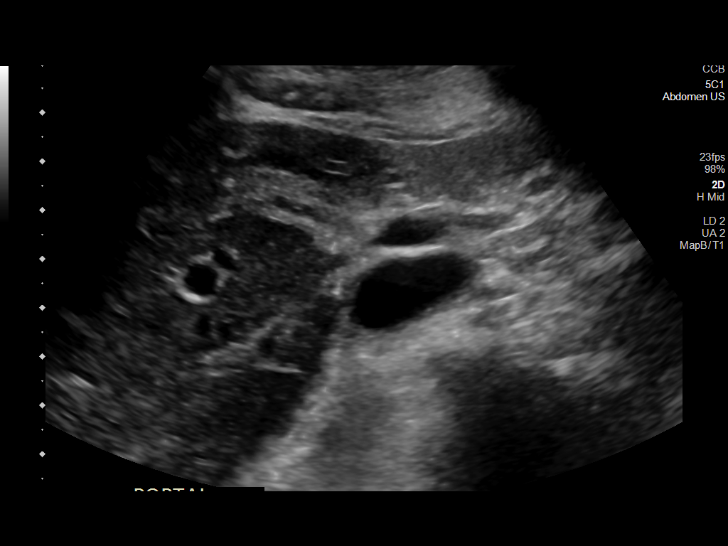
[im 51/88]
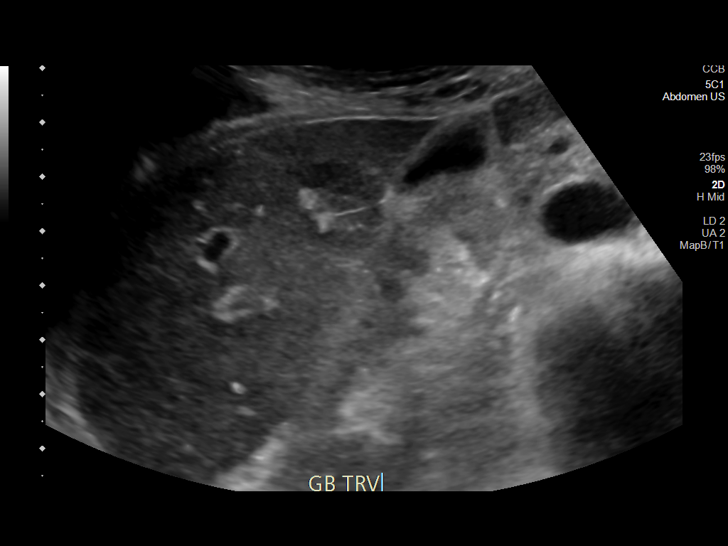
[im 59/88]
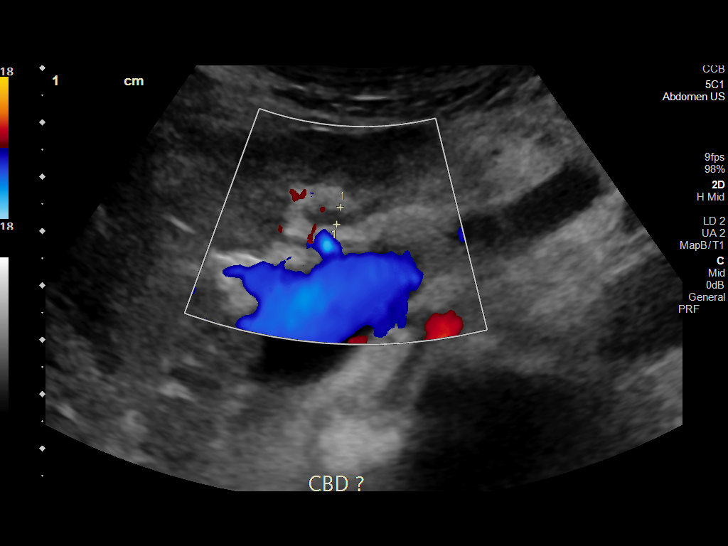
[im 66/88]
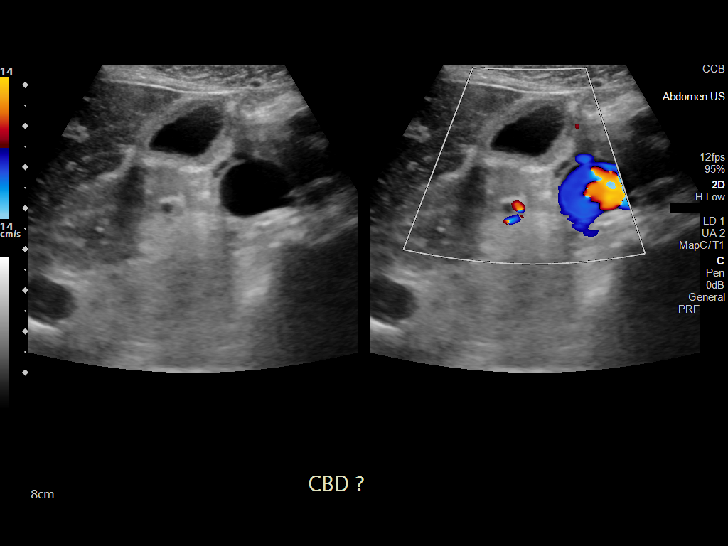
[im 73/88]
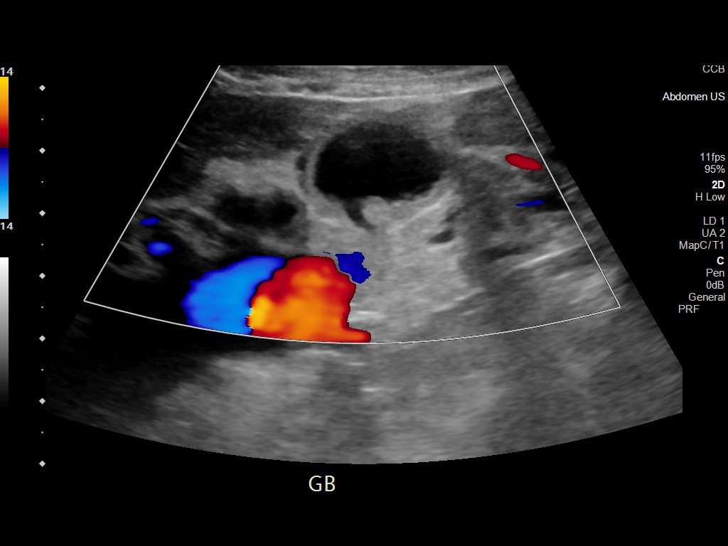
[im 80/88]
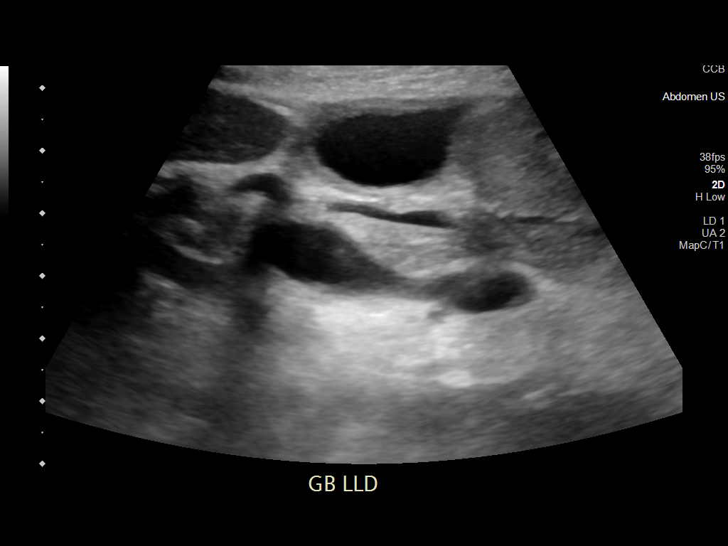
[im 88/88]
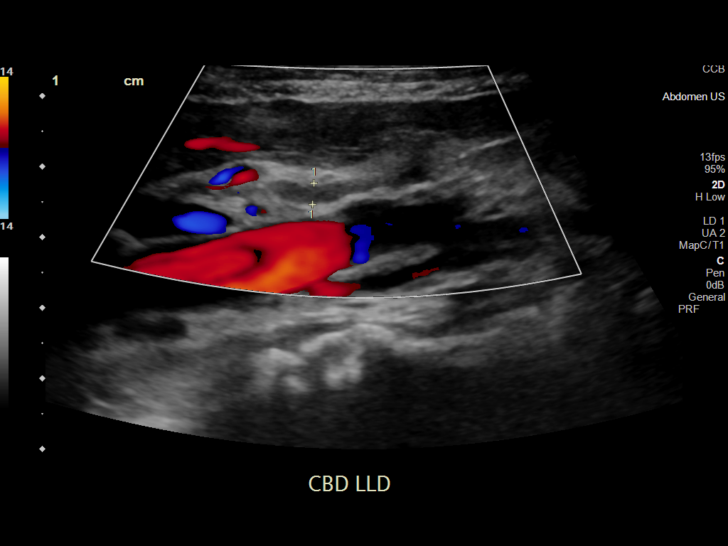

[13 of 25 positions shown; findings below may reference images not displayed]

FINDINGS: Gallbladder:

Mild gallbladder wall thickening. No gallstones visualized. No
sonographic Murphy sign noted by sonographer.

Common bile duct:

Diameter: 3 mm

Liver:

No focal lesion identified. Within normal limits in parenchymal
echogenicity. Portal vein is patent on color Doppler imaging with
normal direction of blood flow towards the liver.

Other: Mildly enlarged proximal aorta, measuring up to 3.0 cm.
Recommend follow-up ultrasound every 5 years. This recommendation
follows ACR consensus guidelines: White Paper of the ACR Incidental
Findings Committee II on Vascular Findings. [HOSPITAL] [[facility]];
[DATE]. Simple cyst of the right kidney measuring up to 1.3 cm.
IMPRESSION: 1. Mild gallbladder wall thickening with no gallstones and negative
sonographic Murphy sign, findings are equivocal for acute
cholecystitis. Further evaluation with HIDA scan could be
considered.
2. Mildly enlarged proximal aorta, measuring up to 3.0 cm. Recommend
follow-up ultrasound every 5 years. This recommendation follows ACR
consensus guidelines: White Paper of the ACR Incidental Findings
Committee II on Vascular Findings. [HOSPITAL] [[facility]];

## 2021-04-06 MED ORDER — MELATONIN 5 MG PO TABS
10.0000 mg | ORAL_TABLET | Freq: Every evening | ORAL | Status: DC | PRN
  Administered 2021-04-09: 23:00:00 10 mg via ORAL
  Filled 2021-04-06: qty 2

## 2021-04-06 MED ORDER — IPRATROPIUM-ALBUTEROL 0.5-2.5 (3) MG/3ML IN SOLN: 3.0000 mL | RESPIRATORY_TRACT | Status: DC | PRN

## 2021-04-06 MED ORDER — DOXYCYCLINE HYCLATE 100 MG PO TABS
100.0000 mg | ORAL_TABLET | Freq: Once | ORAL | Status: AC
  Administered 2021-04-06: 100 mg via ORAL
  Filled 2021-04-06: qty 1

## 2021-04-06 MED ORDER — ASPIRIN EC 81 MG PO TBEC
81.0000 mg | DELAYED_RELEASE_TABLET | Freq: Every day | ORAL | Status: DC
  Administered 2021-04-07 – 2021-04-09 (×3): 81 mg via ORAL
  Filled 2021-04-06 (×3): qty 1

## 2021-04-06 MED ORDER — ACETAMINOPHEN 650 MG RE SUPP: 650.0000 mg | Freq: Four times a day (QID) | RECTAL | Status: DC | PRN

## 2021-04-06 MED ORDER — DOXYCYCLINE HYCLATE 100 MG PO TABS
100.0000 mg | ORAL_TABLET | Freq: Two times a day (BID) | ORAL | Status: DC
  Administered 2021-04-07 – 2021-04-09 (×6): 100 mg via ORAL
  Filled 2021-04-06 (×7): qty 1

## 2021-04-06 MED ORDER — LEVOTHYROXINE SODIUM 50 MCG PO TABS: 50.0000 ug | ORAL_TABLET | ORAL | Status: DC

## 2021-04-06 MED ORDER — SODIUM ZIRCONIUM CYCLOSILICATE 10 G PO PACK
10.0000 g | PACK | Freq: Once | ORAL | Status: AC
  Administered 2021-04-06: 10 g via ORAL
  Filled 2021-04-06: qty 1

## 2021-04-06 MED ORDER — SODIUM ZIRCONIUM CYCLOSILICATE 10 G PO PACK
10.0000 g | PACK | Freq: Two times a day (BID) | ORAL | Status: DC
  Administered 2021-04-06: 10 g via ORAL
  Filled 2021-04-06: qty 1

## 2021-04-06 MED ORDER — LEVOTHYROXINE SODIUM 100 MCG PO TABS
100.0000 ug | ORAL_TABLET | ORAL | Status: DC
Start: 2021-04-07 — End: 2021-04-15
  Administered 2021-04-07 – 2021-04-08 (×2): 100 ug via ORAL
  Filled 2021-04-06 (×3): qty 1

## 2021-04-06 MED ORDER — POLYETHYLENE GLYCOL 3350 17 G PO PACK
17.0000 g | PACK | Freq: Every day | ORAL | Status: DC | PRN
  Administered 2021-04-10: 22:00:00 17 g via ORAL

## 2021-04-06 MED ORDER — PANTOPRAZOLE SODIUM 20 MG PO TBEC
20.0000 mg | DELAYED_RELEASE_TABLET | Freq: Every day | ORAL | Status: DC
  Administered 2021-04-08 – 2021-04-09 (×2): 20 mg via ORAL
  Filled 2021-04-06 (×3): qty 1

## 2021-04-06 MED ORDER — ROSUVASTATIN CALCIUM 5 MG PO TABS
10.0000 mg | ORAL_TABLET | Freq: Every day | ORAL | Status: DC
  Administered 2021-04-07 – 2021-04-09 (×3): 10 mg via ORAL
  Filled 2021-04-06 (×3): qty 1

## 2021-04-06 MED ORDER — ENOXAPARIN SODIUM 30 MG/0.3ML IJ SOSY
30.0000 mg | PREFILLED_SYRINGE | INTRAMUSCULAR | Status: DC
  Administered 2021-04-07 – 2021-04-08 (×2): 30 mg via SUBCUTANEOUS
  Filled 2021-04-06 (×2): qty 0.3

## 2021-04-06 MED ORDER — ADULT MULTIVITAMIN W/MINERALS CH
1.0000 | ORAL_TABLET | Freq: Every day | ORAL | Status: DC
  Administered 2021-04-07 – 2021-04-09 (×3): 1 via ORAL
  Filled 2021-04-06 (×3): qty 1

## 2021-04-06 MED ORDER — LEVOTHYROXINE SODIUM 50 MCG PO TABS
50.0000 ug | ORAL_TABLET | ORAL | Status: DC
  Administered 2021-04-09 – 2021-04-13 (×4): 50 ug via ORAL
  Filled 2021-04-06 (×4): qty 1

## 2021-04-06 MED ORDER — LACTATED RINGERS IV BOLUS
500.0000 mL | Freq: Once | INTRAVENOUS | Status: AC
  Administered 2021-04-06: 500 mL via INTRAVENOUS

## 2021-04-06 MED ORDER — ACETAMINOPHEN 325 MG PO TABS
650.0000 mg | ORAL_TABLET | Freq: Four times a day (QID) | ORAL | Status: DC | PRN
  Administered 2021-04-09 – 2021-04-12 (×6): 650 mg via ORAL
  Filled 2021-04-06 (×6): qty 2

## 2021-04-06 MED ORDER — SODIUM CHLORIDE 0.9% FLUSH
3.0000 mL | Freq: Two times a day (BID) | INTRAVENOUS | Status: DC
  Administered 2021-04-06 – 2021-04-14 (×15): 3 mL via INTRAVENOUS

## 2021-04-06 MED ORDER — SODIUM CHLORIDE 0.9 % IV BOLUS
500.0000 mL | Freq: Once | INTRAVENOUS | Status: AC
  Administered 2021-04-06: 500 mL via INTRAVENOUS

## 2021-04-06 NOTE — ED Notes (Signed)
[facility] at Bedside. 

## 2021-04-06 NOTE — ED Notes (Signed)
Pt's coloring start to return and looks better than she did approximately 30 minutes ago

## 2021-04-06 NOTE — ED Triage Notes (Signed)
Pt from home via EMS. Pt c/o nausea starting today at approx 1000 hours. Pt given zofran by EMS. Nausea currently resolved

## 2021-04-06 NOTE — ED Notes (Signed)
Family updated on Patient Admission and Bed Status. Patient and Family Member give Verbal Consent for Transfer.

## 2021-04-06 NOTE — ED Provider Notes (Addendum)
[facility] EMERGENCY DEPT Provider Note   CSN: [id number] Arrival date & time: 04/20/2021  1237     History Chief Complaint  Patient presents with   Nausea    Stacy Sanchez is a 75 y.o. female.  Stacy Sanchez is a 75 y.o. female who presents to the ED c/o epigastric pain since about 830 to 9 AM this morning, she describes this as a "real nervous pain".  She has had similar pains for the last week that come and go, occur daily.  They last about an hour to an hour and a half.  Typically laying down and rubbing her abdomen helps.  Nothing seems to make the pain worse.  Had some nausea this morning but did not vomit.  Has had a slight cough which is chronic for her.  Has felt generally weak.  States that 4 to 5 months ago she had similar pains but related to anxiety since she had to move abruptly.  Was able to discuss with daughter afterwards who reports that her mother's not been eating or drinking well, she is concerned for some dehydration.  She currently lives at an independent living facility, [facility], as she has refused assisted living facilities.  Patient is in charge of taking her medications, which daughter reports is also a problem.  The daughter notes that her biggest concern is this abdominal pain that she has been complaining about.  She was scheduled to see GI recently but unfortunately the appointment had to be rescheduled as the provider was out.  Notes that she had a swallow study done recently that looked "fairly okay". She was asking about whether an EGD could be done. She does have a history of heart failure. Daughter also notes a 30 lb weight loss this year. She has not been eating or drinking well.      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[place])    Pseudomonas respiratory infection    Thyroid disease     Patient  Active Problem List   Diagnosis Date Noted   Acute bronchitis 05/09/2020   Dysuria 05/09/2020   Altered mental status    AKI (acute kidney injury) ([place]) 07/14/2019   Thrombocytopenia ([place]) 07/14/2019   Hypothyroidism 62/37/6283   Acute metabolic encephalopathy 15/17/6160   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   Chronic respiratory failure with hypoxia ([place]) 12/07/2018   Bronchiectasis ([place]) 11/27/2018   Pseudomonas aeruginosa infection 11/27/2018   Thrush 11/27/2018   CAD (coronary artery disease) 11/27/2018   CKD (chronic kidney disease), stage III ([place]) 11/27/2018   Essential hypertension 11/27/2018   Generalized anxiety disorder 11/27/2018   Peripheral neuropathy 11/27/2018   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    Past Surgical History:  Procedure Laterality Date   ABDOMINAL HYSTERECTOMY     BRONCHOSCOPY  2001   CARDIAC SURGERY     COLONOSCOPY  2008   CYST REMOVAL TRUNK  1965   Left axilla; benign   DILATION AND CURETTAGE OF UTERUS  1975   HAND SURGERY Right 1983   ICD IMPLANT Left 02/01/2020   PATENT DUCTUS ARTERIOUS REPAIR  1952   RIGHT HEART CATH N/A 03/23/2020   Procedure: RIGHT HEART CATH;  Surgeon: Jolaine Artist, MD;  Location: [facility];  Service: Cardiovascular;  Laterality: N/A;   [place]  OB History     Gravida      Para      Term      Preterm      AB      Living  3      SAB      IAB      Ectopic      Multiple      Live Births              Family History  Problem Relation Age of Onset   Thyroid disease Daughter     Social History   Tobacco Use   Smoking status: Never    Passive exposure: Never   Smokeless tobacco: Never  Vaping Use   Vaping Use: Never used  Substance Use Topics   Alcohol use: No   Drug use: No    Home Medications Prior to Admission medications   Medication Sig Start Date End Date Taking? Authorizing Provider  acetaminophen (TYLENOL) 650 MG CR  tablet Take 650 mg by mouth every 8 (eight) hours as needed for pain.     [provider]  aspirin EC 81 MG tablet Take 81 mg by mouth daily.    [provider]  buPROPion (WELLBUTRIN XL) 300 MG 24 hr tablet Take 300 mg by mouth daily.  11/01/19   [provider]  escitalopram (LEXAPRO) 10 MG tablet Take 10 mg by mouth daily.    [provider]  fluticasone (FLONASE) 50 MCG/ACT nasal spray Place 1 spray into both nostrils daily as needed for allergies or rhinitis.    [provider]  gabapentin (NEURONTIN) 300 MG capsule Take 300 mg by mouth 2 (two) times daily.    [provider]  ipratropium-albuterol (DUONEB) 0.5-2.5 (3) MG/3ML SOLN Take 3 mLs by nebulization 3 (three) times daily as needed. 11/09/19   Chesley Mires, MD  lansoprazole (PREVACID) 30 MG capsule Take 1 capsule (30 mg total) by mouth 2 (two) times daily before a meal. 03/06/21   Nandigam, Venia Minks, MD  levothyroxine (SYNTHROID, LEVOTHROID) 50 MCG tablet Take 50-100 mcg by mouth See admin instructions. Take 50 mg  tablet daily on Monday through Friday  Take 100 mcg  Saturday and Sunday    [provider]  Melatonin 10 MG TABS Take 1 tablet by mouth at bedtime.    [provider]  Multiple Vitamin (MULTIVITAMIN WITH MINERALS) TABS tablet Take 1 tablet by mouth daily.    [provider]  nadolol (CORGARD) 20 MG tablet Take 1 tablet (20 mg total) by mouth 2 (two) times daily. 03/02/21   Bensimhon, Shaune Pascal, MD  Nebulizers (COMPRESSOR/NEBULIZER) MISC 1 Product by Does not apply route once for 1 dose. 12/01/18 07/13/28  Swayze, Ava, DO  OXYGEN Inhale 2-3 L into the lungs See admin instructions. As needed and at bedtime use 3 liters with CPAP    [provider]  polyethylene glycol powder (GLYCOLAX/MIRALAX) 17 GM/SCOOP powder Take 17 g by mouth daily as needed for moderate constipation.  07/13/09   [provider]  rosuvastatin (CRESTOR) 10 MG tablet  Take 10 mg by mouth daily. 10/19/18   [provider]  sodium chloride HYPERTONIC 3 % nebulizer solution Take 4 mLs by nebulization 2 (two) times daily as needed for other. 11/09/19   Chesley Mires, MD    Allergies    Losartan, Amikacin, Cefepime, Ethambutol, Levaquin [levofloxacin], Parafon forte dsc [chlorzoxazone], Rifabutin, Tequin [gatifloxacin], Zyvox [linezolid], and Lisinopril  Review of Systems  Review of Systems  Constitutional:  Positive for appetite change and unexpected weight change. Negative for fever.  HENT:  Negative for congestion, rhinorrhea and sore throat.   Respiratory:  Positive for cough. Negative for shortness of breath.   Cardiovascular:  Negative for chest pain and leg swelling.  Gastrointestinal:  Positive for abdominal pain and nausea. Negative for constipation, diarrhea and vomiting.  Genitourinary:  Negative for difficulty urinating and dysuria.  Neurological:  Positive for weakness. Negative for syncope and headaches.   Physical Exam Updated Vital Signs BP (!) 148/94    Pulse 93    Temp 97.6 F (36.4 C) (Oral)    Resp 17    Ht 5' 8" (1.727 m)    Wt 46.7 kg    SpO2 97%    BMI 15.66 kg/m   Physical Exam Constitutional:      Appearance: Normal appearance.     Comments: Thin, cachectic elderly lady, pleasant  HENT:     Head: Normocephalic.     Nose: Nose normal.     Mouth/Throat:     Mouth: Mucous membranes are moist.     Pharynx: Oropharynx is clear.  Eyes:     Comments: Conjunctival pallor  Cardiovascular:     Rate and Rhythm: Normal rate and regular rhythm.     Pulses: Normal pulses.     Heart sounds: Normal heart sounds.  Pulmonary:     Effort: Pulmonary effort is normal. No respiratory distress.     Breath sounds: No wheezing.     Comments: Diffuse coarse crackles diffusely Abdominal:     General: There is no distension.     Palpations: Abdomen is soft.     Tenderness: There is no abdominal tenderness. There is no guarding or  rebound.  Musculoskeletal:     Cervical back: Neck supple.  Skin:    General: Skin is warm.     Capillary Refill: Capillary refill takes less than 2 seconds.  Neurological:     Mental Status: She is alert.  Psychiatric:        Mood and Affect: Mood normal.        Behavior: Behavior normal.    ED Results / Procedures / Treatments   Labs (all labs ordered are listed, but only abnormal results are displayed) Labs Reviewed  CBC WITH DIFFERENTIAL/PLATELET - Abnormal; Notable for the following components:      Result Value   WBC 12.3 (*)    HCT 46.9 (*)    MCV 101.1 (*)    MCHC 29.6 (*)    RDW 16.0 (*)    Neutro Abs 9.7 (*)    All other components within normal limits  COMPREHENSIVE METABOLIC PANEL - Abnormal; Notable for the following components:   Potassium 6.0 (*)    Chloride 95 (*)    CO2 36 (*)    Glucose, Bld 131 (*)    BUN 45 (*)    Creatinine, Ser 1.37 (*)    Albumin 3.2 (*)    AST 60 (*)    Alkaline Phosphatase 144 (*)    GFR, Estimated 40 (*)    All other components within normal limits  BRAIN NATRIURETIC PEPTIDE - Abnormal; Notable for the following components:   B Natriuretic Peptide 1,633.0 (*)    All other components within normal limits  RESP PANEL BY RT-PCR (FLU A&B, COVID) ARPGX2  LIPASE, BLOOD  BASIC METABOLIC PANEL  TSH    EKG EKG Interpretation  Date/Time:  Friday April 06 2021  12:51:47 EST Ventricular Rate:  97 PR Interval:  160 QRS Duration: 90 QT Interval:  351 QTC Calculation: 446 R Axis:   -88 Text Interpretation: Sinus rhythm Biatrial enlargement Left anterior fascicular block Abnormal R-wave progression, late transition Nonspecific T abnormalities, anterior leads Minimal ST elevation, anterior leads No significant change since last tracing Confirmed by Isla Pence [phone]) on 04/14/2021 1:57:00 PM  Radiology DG Chest Portable 1 View  Result Date: 04/16/2021 CLINICAL DATA:  Weakness, history of CHF EXAM: PORTABLE CHEST 1 VIEW  COMPARISON:  CT examination and chest radiograph dated December 24, 2020. FINDINGS: The heart is mildly enlarged. Single lead AICD in place. Biapical pleural/parenchymal scarring. Bilateral prominent interstitial marking consistent with prominent bronchiectasis, unchanged. Stable bibasilar atelectasis/mild fibrotic changes. IMPRESSION: 1. Prominent bilateral interstitial markings Carroll system with bronchiectasis with mild basilar atelectasis, unchanged. Acute airspace disease can not be completely excluded. No large pleural effusion. 2.  Stable mild cardiomegaly with AICD.  No evidence of pulmonary Electronically Signed   By: Keane Police D.O.   On: 04/07/2021 13:36    Procedures Procedures   Medications Ordered in ED Medications  lactated ringers bolus 500 mL (500 mLs Intravenous New Bag/Given 03/31/2021 1445)    ED Course  I have reviewed the triage vital signs and the nursing notes.  Pertinent labs & imaging results that were available during my care of the patient were reviewed by me and considered in my medical decision making (see chart for details).    MDM Rules/Calculators/A&P                         Niah Heinle is a 75 y.o. female with PMHx of chronic respiratory failure with hypoxia on 2L home O2 during day and 3L at night, MAC, HTN, CKD, CAD, GAD, hypothyroidism, who presents with upper abdominal pain since this morning with associated weakness. Initial vitals notable for hypertension to 155/107, tachypnea 24, SpO2 95%, afebrile. Initial labs returned and notable for elevated BNP to 1633, hyperkalemia with a potassium of 6, BUN 45, creatinine 1.37 (appears stable), AST 60, ALT 26, alk phos 144, WBC 12.3, MCV 101.1.   Clinically it is very difficult to assess volume status, she does not appear fluid overloaded, she is very thin and cachectic appearing.  Lungs with coarse crackles diffusely, likely consistent with her chronic lung disease.  Chest x-ray without overt signs of pulmonary  edema, no pleural effusion.   Will give 500 mL bolus. Ordering STAT BMP to reassess potassium. Will give Lokelma if persistently elevated. No EKG changes. Will order RUQ U/S and lipase given upper abdominal pain and elevated Alk Phos/AST with nausea this AM.   On chart review, patient had CT Abd/Pelvis that showed 11 mm indeterminate lesion of left kidney and mild left non-obstructing nephrolithiasis. 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 F/u renal ultrasound demonstrated simple and mildly complex renal cysts within both kidneys, no suspicious characteristics of the cysts. There was a left renal stone.   Reviewed swallow study performed on 11/29 which did not show stricture or mass. There was moderate dysmotility but barium tab passed GE junction. Most recent Echo 03/02/21 shows EF 45-50%, grade 1 DD, normal pulmonary artery systolic pressure.   In to reassess patient, she notes that she feels well. Daughter at the bedside reports she seems more tired than usual. She was drifting to sleep intermittently throughout encounter. She  just had RUQ U/S repeated. Fluids have finished. Repeat BMP and lipase are still pending. She does have hx of hypothyroidism on Synthroid. Patient manages her own medications, reports good compliance but given "memory issues" reported by daughter, unclear how reliable she is. Will check TSH.   If imaging, lipase, TSH, or repeat BMP are not concerning, anticipate that patient may be able to d/c home with close PCP follow up. No urgent need for EGD as she is not having dysphagia or acute blood loss (hgb WNL). May require admission if oxygen requirement is increased (thus far she is stable on home O2) or any other acute concerns.   Patient signed out to Dr. Gilford Raid who will assume care.   Final Clinical Impression(s) / ED Diagnoses Final diagnoses:  RUQ abdominal pain  Pain of upper abdomen   Unintentional weight loss  Bronchiectasis without complication [facility])    Rx / DC Orders ED Discharge Orders     None        Sharion Settler, DO 03/31/2021 [place], [place], DO 03/23/2021 1549    Isla Pence, MD 04/13/2021 1807

## 2021-04-06 NOTE — ED Notes (Signed)
Care Handoff/Report given to [facility] at this Time. All Questions Answered. 

## 2021-04-06 NOTE — ED Notes (Signed)
Patient currently refusing Second Collection of Blood Culture Specimens.

## 2021-04-06 NOTE — Plan of Care (Signed)
[facility] Plan of Care Note for accepted transfer  Patient: Stacy Sanchez    YSA:[id number]  DOA: Apr 24, 2021     Received a phone call from Facility: [facility], regarding transfer of Ms. [name]. Requesting Provider: Dr Particia Nearing Reason for transfer: Admission for failure to thrive and hyperkalemia and AKI History: Brought in from [facility], via EMS due to progressive weakness.  On 2 LPM at home.  Now needing 3-4 LPM with shortness of breath.  Found to have pneumonia, dehydration with poor p.o. intake with acute kidney injury and hyperkalemia. Work up: Chest x-ray positive for pneumonia. Treatment received: Lokelma for hyperkalemia.  Doxycycline for pneumonia and IV fluids.   Plan of care: The patient will be accepted for admission to Telemetry unit, at [facility]. Recommended EDP to repeat the level and if the level remains elevated and patient requires nephrology input for treatment for hyperkalemia recommend a EDP to call us back again so that the patient can be transferred to [facility] instead of [facility].    Author: Lynden Oxford, MD [facility] 04/24/2021  If 7PM-7AM, please contact night-coverage at [email or web address],

## 2021-04-06 NOTE — H&P (Signed)
History and Physical   Stacy Sanchez ZJQ:[id number] DOB: 19-Jul-1945 DOA: 04/01/2021  PCP: Burnett Sheng, MD   Patient coming from: Home  Chief Complaint: Abdominal pain  HPI: Stacy Sanchez is a 75 y.o. female with medical history significant of bronchiectasis, CAD, CKD 3, hypertension, hypothyroidism, neuropathy, cytopenia, CHF, MAC presenting with ongoing abdominal pain.  Patient had epigastric abdominal pain for the past day and has had a similar pain waxing waning for the past week or so.  Typically the pain lasts for about 1-1/2 hours and is improved with laying down and rubbing her bowel movement.  She had episode of nausea today without vomiting.  She has a chronic cough as well.  Corroboration with family show patient has decreased p.o. intake there is a plan for her to see GI with for her abdominal pain but this has been rescheduled.  Also noted with her QTC p.o. intake to have 30 pound weight loss in the last year or so.  She denies fevers, chills, chest pain, shortness of breath, constipation, diarrhea.   ED Course: Vital signs in the ED stable.  Lab work-up showed BMP with potassium of 6, chloride 95, bicarb 36, BUN 45, creatinine stable around 1.37, glucose 131, albumin 3.2, AST 60, alk phos 144.  CBC showed mild leukocytosis of 12.3.  BNP elevated at 1600.  TSH normal.  Lactic acid normal.  With repeat pending.  Lipase normal.  Rest were panel flu and COVID-negative.  Urinalysis and blood cultures pending.  Chest x-ray showed stable prominent interstitial markings and unable to exclude airspace disease.  Ultrasound of the abdomen showed mild wall thickening of the gallbladder without Murphy sign equivocal for possible cholecystitis.  No evidence of stent.  Also noted was mild proximal aortic enlargement.  Patient received doxycycline, Lokelma, and a liter of fluids in the ED.  Review of Systems: As per HPI otherwise all other systems reviewed and are negative.   Past Medical  History:  Diagnosis Date   Arthritis    CHF (congestive heart failure) ([place])    Hypertension    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[place])    Pseudomonas respiratory infection    Thyroid disease     Past Surgical History:  Procedure Laterality Date   ABDOMINAL HYSTERECTOMY     BRONCHOSCOPY  2001   CARDIAC SURGERY     COLONOSCOPY  2008   CYST REMOVAL TRUNK  1965   Left axilla; benign   DILATION AND CURETTAGE OF UTERUS  1975   HAND SURGERY Right 1983   ICD IMPLANT Left 02/01/2020   PATENT DUCTUS ARTERIOUS REPAIR  1952   RIGHT HEART CATH N/A 03/23/2020   Procedure: RIGHT HEART CATH;  Surgeon: Jolaine Artist, MD;  Location: [facility];  Service: Cardiovascular;  Laterality: N/A;   [place]    Social History  reports that she has never smoked. She has never been exposed to tobacco smoke. She has never used smokeless tobacco. She reports that she does not drink alcohol and does not use drugs.  Allergies  Allergen Reactions   Losartan Cough   Amikacin Other (See Comments)    nephropathy    Cefepime Other (See Comments)    Tremors Slurred speech  AKI   Ethambutol Other (See Comments)    neuropathy   Levaquin [Levofloxacin] Other (See Comments)    Fainting and weakness   Parafon Forte Dsc [Chlorzoxazone] Hives  Rifabutin Diarrhea   Tequin [Gatifloxacin] Other (See Comments)    Fainting and weakness   Zyvox [Linezolid] Diarrhea   Lisinopril Nausea Only    Very bitter taste in mouth caused anorexia    Family History  Problem Relation Age of Onset   Thyroid disease Daughter   Reviewed on admission  Prior to Admission medications   Medication Sig Start Date End Date Taking? Authorizing Provider  acetaminophen (TYLENOL) 650 MG CR tablet Take 650 mg by mouth every 8 (eight) hours as needed for pain.     [provider]  aspirin EC 81 MG tablet Take 81 mg by mouth daily.     [provider]  buPROPion (WELLBUTRIN XL) 300 MG 24 hr tablet Take 300 mg by mouth daily.  11/01/19   [provider]  escitalopram (LEXAPRO) 10 MG tablet Take 10 mg by mouth daily.    [provider]  fluticasone (FLONASE) 50 MCG/ACT nasal spray Place 1 spray into both nostrils daily as needed for allergies or rhinitis.    [provider]  gabapentin (NEURONTIN) 300 MG capsule Take 300 mg by mouth 2 (two) times daily.    [provider]  ipratropium-albuterol (DUONEB) 0.5-2.5 (3) MG/3ML SOLN Take 3 mLs by nebulization 3 (three) times daily as needed. 11/09/19   Chesley Mires, MD  lansoprazole (PREVACID) 30 MG capsule Take 1 capsule (30 mg total) by mouth 2 (two) times daily before a meal. 03/06/21   Nandigam, Venia Minks, MD  levothyroxine (SYNTHROID, LEVOTHROID) 50 MCG tablet Take 50-100 mcg by mouth See admin instructions. Take 50 mg  tablet daily on Monday through Friday  Take 100 mcg  Saturday and Sunday    [provider]  Melatonin 10 MG TABS Take 1 tablet by mouth at bedtime.    [provider]  Multiple Vitamin (MULTIVITAMIN WITH MINERALS) TABS tablet Take 1 tablet by mouth daily.    [provider]  nadolol (CORGARD) 20 MG tablet Take 1 tablet (20 mg total) by mouth 2 (two) times daily. 03/02/21   Bensimhon, Shaune Pascal, MD  Nebulizers (COMPRESSOR/NEBULIZER) MISC 1 Product by Does not apply route once for 1 dose. 12/01/18 07/13/28  Swayze, Ava, DO  OXYGEN Inhale 2-3 L into the lungs See admin instructions. As needed and at bedtime use 3 liters with CPAP    [provider]  polyethylene glycol powder (GLYCOLAX/MIRALAX) 17 GM/SCOOP powder Take 17 g by mouth daily as needed for moderate constipation.  07/13/09   [provider]  rosuvastatin (CRESTOR) 10 MG tablet Take 10 mg by mouth daily. 10/19/18   [provider]  sodium chloride HYPERTONIC 3 % nebulizer solution Take 4 mLs by nebulization 2 (two) times  daily as needed for other. 11/09/19   Chesley Mires, MD    Physical Exam: Vitals:   04/13/2021 1945 03/27/2021 2000 04/09/2021 2045 04/17/2021 2137  BP:  (!) 125/91 124/88 112/80  Pulse:  94 93 94  Resp:  20 (!) 28 16  Temp: (!) 97.5 F (36.4 C)   97.8 F (36.6 C)  TempSrc: Oral   Oral  SpO2:  96% 96% 98%  Weight:      Height:       Physical Exam Constitutional:      General: She is not in acute distress.    Comments: Thin, frail appearing elderly female  HENT:     Head: Normocephalic and atraumatic.     Mouth/Throat:     Mouth: Mucous membranes  are moist.     Pharynx: Oropharynx is clear.  Eyes:     Extraocular Movements: Extraocular movements intact.     Pupils: Pupils are equal, round, and reactive to light.  Cardiovascular:     Rate and Rhythm: Normal rate and regular rhythm.     Pulses: Normal pulses.     Heart sounds: Normal heart sounds.  Pulmonary:     Effort: Pulmonary effort is normal. No respiratory distress.     Breath sounds: Normal breath sounds.  Abdominal:     General: Bowel sounds are normal. There is no distension.     Palpations: Abdomen is soft.     Tenderness: There is no abdominal tenderness.  Musculoskeletal:        General: No swelling or deformity.  Skin:    General: Skin is warm and dry.  Neurological:     General: No focal deficit present.     Mental Status: Mental status is at baseline.    Labs on Admission: I have personally reviewed following labs and imaging studies  CBC: Recent Labs  Lab 04/12/2021 1259  WBC 12.3*  NEUTROABS 9.7*  HGB 13.9  HCT 46.9*  MCV 101.1*  PLT 606    Basic Metabolic Panel: Recent Labs  Lab 04/10/2021 1259 03/26/2021 1545 04/05/2021 2222  NA 138 141  --   K 6.0* 6.0* 5.6*  CL 95* 95*  --   CO2 36* 40*  --   GLUCOSE 131* 104*  --   BUN 45* 47*  --   CREATININE 1.37* 1.47*  --   CALCIUM 9.4 9.0  --   MG  --   --  2.0  PHOS  --   --  7.5*    GFR: Estimated Creatinine Clearance: 23.6 mL/min (A) (by C-G  formula based on SCr of 1.47 mg/dL (H)).  Liver Function Tests: Recent Labs  Lab 04/19/2021 1259  AST 60*  ALT 26  ALKPHOS 144*  BILITOT 0.8  PROT 7.7  ALBUMIN 3.2*    Urine analysis:    Component Value Date/Time   COLORURINE YELLOW 12/24/2020 1855   APPEARANCEUR CLEAR 12/24/2020 1855   LABSPEC 1.021 12/24/2020 1855   PHURINE 6.0 12/24/2020 1855   GLUCOSEU NEGATIVE 12/24/2020 1855   HGBUR NEGATIVE 12/24/2020 1855   BILIRUBINUR NEGATIVE 12/24/2020 1855   KETONESUR NEGATIVE 12/24/2020 1855   PROTEINUR 30 (A) 12/24/2020 1855   NITRITE NEGATIVE 12/24/2020 1855   LEUKOCYTESUR TRACE (A) 12/24/2020 1855    Radiological Exams on Admission: DG Chest Portable 1 View  Result Date: 03/29/2021 CLINICAL DATA:  Weakness, history of CHF EXAM: PORTABLE CHEST 1 VIEW COMPARISON:  CT examination and chest radiograph dated December 24, 2020. FINDINGS: The heart is mildly enlarged. Single lead AICD in place. Biapical pleural/parenchymal scarring. Bilateral prominent interstitial marking consistent with prominent bronchiectasis, unchanged. Stable bibasilar atelectasis/mild fibrotic changes. IMPRESSION: 1. Prominent bilateral interstitial markings Carroll system with bronchiectasis with mild basilar atelectasis, unchanged. Acute airspace disease can not be completely excluded. No large pleural effusion. 2.  Stable mild cardiomegaly with AICD.  No evidence of pulmonary Electronically Signed   By: Keane Police D.O.   On: 03/27/2021 13:36   US Abdomen Limited RUQ (LIVER/GB)  Result Date: 04/19/2021 CLINICAL DATA:  Right upper quadrant pain EXAM: ULTRASOUND ABDOMEN LIMITED RIGHT UPPER QUADRANT COMPARISON:  None. FINDINGS: Gallbladder: Mild gallbladder wall thickening. No gallstones visualized. No sonographic Murphy sign noted by sonographer. Common bile duct: Diameter: 3 mm Liver: No focal lesion identified. Within  normal limits in parenchymal echogenicity. Portal vein is patent on color Doppler imaging  with normal direction of blood flow towards the liver. 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 Recommend follow-up ultrasound every 5 years. This recommendation follows ACR consensus guidelines: White Paper of the ACR Incidental Findings Committee II on Vascular Findings. J Am Coll Radiol 2013; 10:789-794. Simple cyst of the right kidney measuring up to 1.3 cm. IMPRESSION: 1. Mild gallbladder wall thickening with no gallstones and negative sonographic Murphy sign, findings are equivocal for acute cholecystitis. Further evaluation with HIDA scan could be considered. 2. 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 Recommend follow-up ultrasound every 5 years. This recommendation follows ACR consensus guidelines: White Paper of the ACR Incidental Findings Committee II on Vascular Findings. J Am Coll Radiol 2013; 10:789-794. Electronically Signed   By: Yetta Glassman M.D.   On: 04/17/2021 15:54    EKG: Independently reviewed.  Sinus rhythm at 97 bpm.  T wave abnormalities in anterior leads.  T wave inversions new from previous.  Assessment/Plan Principal Problem:   Adult failure to thrive Active Problems:   CAD (coronary artery disease)   CKD (chronic kidney disease), stage III (HCC)   Essential hypertension   Hypothyroidism   Anxiety state   Major depressive disorder with single episode, in partial remission ([place])   Memory loss of unknown cause   OSA on CPAP   Abnormal EKG   Hyperkalemia  Adult failure to thrive > Plan for adult failure 5 considering patient has lost 30 pounds last year and had minimal p.o. intake with recurrent abdominal pain. - Potassium is elevated will check other electrolytes including magnesium and phosphorus. - Nutrition consult - Supportive care  Hyperkalemia > Potassium elevated to 6 initially and then again on repeat.  We will continue with Lokelma and trend potassium level and stop once returning to normal. -Trend potassium -  Continue Lokelma ADDENDUM - Repeat K down to 5.6, will hold off on further lokelma for now  Leukocytosis ?  Pneumonia > Noted to have mild leukocytosis 12.3 in the ED.  Also chest x-ray with inability to rule out airspace disease. > Doxycycline ordered in the ED will continue this for now. > Also ordered in the ED were urinalysis and blood cultures. - Continue with doxycycline for now - Follow-up urinalysis and blood cultures - Trend fever curve and white count  EKG abnormality > New T wave inversions.  We will check and trend troponin.  Also notably BNP elevated. - Troponin  CHF Hypertension > Known history of CHF last echo was last month with EF 45-50% and grade 1 diastolic dysfunction. > BNP was elevated in the ED to greater than 1600 however patient does not appear volume overloaded and received some fluid. > We will monitor for any evidence of worsening respirations and consider CHF exacerbation at that time.  We will also be checking troponin as above concerning new EKG changes. - Holding home nadolol in the setting of low normal blood pressures - Not currently on daily diuretic  Anxiety Depression - Continue home medications when confirmed - Reportedly on Remeron, unable to reach daughter to confirm  Hypothyroidism - Continue home Synthroid  CAD Hyperlipidemia - Continue home rosuvastatin - Continue home aspirin  CKD 3 > Creatinine stable at 1.37. - Avoid nephrotoxic meds - Trend renal function electrolytes  OSA - Continue home CPAP  DVT prophylaxis: Lovenox  Code Status:   Full  Family Communication:  Attempted to contact daughter by phone with there was no answer. Disposition Plan:   Patient is from:  [place] independent living  Anticipated DC to:  Same as above  Anticipated DC date:  2-4 days  Anticipated DC barriers: None  Consults called:  None  Admission status:  Inpatient, telemetry   Severity of Illness: The appropriate patient status for this  patient is INPATIENT. Inpatient status is judged to be reasonable and necessary in order to provide the required intensity of service to ensure the patient's safety. The patient's presenting symptoms, physical exam findings, and initial radiographic and laboratory data in the context of their chronic comorbidities is felt to place them at high risk for further clinical deterioration. Furthermore, it is not anticipated that the patient will be medically stable for discharge from the hospital within 2 midnights of admission.   * I certify that at the point of admission it is my clinical judgment that the patient will require inpatient hospital care spanning beyond 2 midnights from the point of admission due to high intensity of service, high risk for further deterioration and high frequency of surveillance required.Marcelyn Bruins MD [facility]  How to contact the [facility] Attending or Consulting provider [place] or covering provider during after hours [place], for this patient?   Check the care team in [facility] and look for a) attending/consulting [facility] provider listed and b) the [facility] team listed Log into [email or web address] and use [place] universal password to access. If you do not have the password, please contact the hospital operator. Locate the [facility] provider you are looking for under [facility] and page to a number that you can be directly reached. If you still have difficulty reaching the provider, please page the [facility] (Director on Call) for the Hospitalists listed on amion for assistance.  03/22/2021, 11:48 PM

## 2021-04-06 NOTE — ED Notes (Signed)
Care Handoff/Report given to Driss at [facility] [place]. All Questions answered.

## 2021-04-06 NOTE — ED Notes (Signed)
Client arrived via [facility] secondary to weakness, denies any fall or syncope. On oxygen from home, states she uses oxygen at home cont at 2lpm via [place], 3lpm via [place] at night. Per EMS VSS, [facility]CS WNL.

## 2021-04-07 ENCOUNTER — Inpatient Hospital Stay (HOSPITAL_COMMUNITY): Payer: Medicare HMO

## 2021-04-07 DIAGNOSIS — R1010 Upper abdominal pain, unspecified: Secondary | ICD-10-CM

## 2021-04-07 DIAGNOSIS — R634 Abnormal weight loss: Secondary | ICD-10-CM

## 2021-04-07 DIAGNOSIS — R4182 Altered mental status, unspecified: Secondary | ICD-10-CM

## 2021-04-07 DIAGNOSIS — N1832 Chronic kidney disease, stage 3b: Secondary | ICD-10-CM

## 2021-04-07 DIAGNOSIS — R627 Adult failure to thrive: Principal | ICD-10-CM

## 2021-04-07 DIAGNOSIS — R9431 Abnormal electrocardiogram [ECG] [EKG]: Secondary | ICD-10-CM

## 2021-04-07 DIAGNOSIS — I5022 Chronic systolic (congestive) heart failure: Secondary | ICD-10-CM

## 2021-04-07 LAB — CBC WITH DIFFERENTIAL/PLATELET
Abs Immature Granulocytes: 0.05 10*3/uL (ref 0.00–0.07)
Basophils Absolute: 0.1 10*3/uL (ref 0.0–0.1)
Basophils Relative: 1 %
Eosinophils Absolute: 0 10*3/uL (ref 0.0–0.5)
Eosinophils Relative: 0 %
HCT: 41.9 % (ref 36.0–46.0)
Hemoglobin: 12.2 g/dL (ref 12.0–15.0)
Immature Granulocytes: 1 %
Lymphocytes Relative: 18 %
Lymphs Abs: 1.9 10*3/uL (ref 0.7–4.0)
MCH: 29.9 pg (ref 26.0–34.0)
MCHC: 29.1 g/dL — ABNORMAL LOW (ref 30.0–36.0)
MCV: 102.7 fL — ABNORMAL HIGH (ref 80.0–100.0)
Monocytes Absolute: 0.9 10*3/uL (ref 0.1–1.0)
Monocytes Relative: 8 %
Neutro Abs: 7.5 10*3/uL (ref 1.7–7.7)
Neutrophils Relative %: 72 %
Platelets: 342 10*3/uL (ref 150–400)
RBC: 4.08 MIL/uL (ref 3.87–5.11)
RDW: 16 % — ABNORMAL HIGH (ref 11.5–15.5)
WBC: 10.5 10*3/uL (ref 4.0–10.5)
nRBC: 0.6 % — ABNORMAL HIGH (ref 0.0–0.2)

## 2021-04-07 LAB — COMPREHENSIVE METABOLIC PANEL
ALT: 23 U/L (ref 0–44)
ALT: 23 U/L (ref 0–44)
AST: 33 U/L (ref 15–41)
AST: 27 U/L (ref 15–41)
Albumin: 2.4 g/dL — ABNORMAL LOW (ref 3.5–5.0)
Albumin: 2.4 g/dL — ABNORMAL LOW (ref 3.5–5.0)
Alkaline Phosphatase: 110 U/L (ref 38–126)
Alkaline Phosphatase: 103 U/L (ref 38–126)
Anion gap: 8 (ref 5–15)
Anion gap: 9 (ref 5–15)
BUN: 62 mg/dL — ABNORMAL HIGH (ref 8–23)
BUN: 62 mg/dL — ABNORMAL HIGH (ref 8–23)
CO2: 36 mmol/L — ABNORMAL HIGH (ref 22–32)
CO2: 33 mmol/L — ABNORMAL HIGH (ref 22–32)
Calcium: 8.5 mg/dL — ABNORMAL LOW (ref 8.9–10.3)
Calcium: 8.4 mg/dL — ABNORMAL LOW (ref 8.9–10.3)
Chloride: 94 mmol/L — ABNORMAL LOW (ref 98–111)
Chloride: 93 mmol/L — ABNORMAL LOW (ref 98–111)
Creatinine, Ser: 2.13 mg/dL — ABNORMAL HIGH (ref 0.44–1.00)
Creatinine, Ser: 2.55 mg/dL — ABNORMAL HIGH (ref 0.44–1.00)
GFR, Estimated: 23 mL/min — ABNORMAL LOW (ref >60)
GFR, Estimated: 19 mL/min — ABNORMAL LOW (ref >60)
Glucose, Bld: 100 mg/dL — ABNORMAL HIGH (ref 70–99)
Glucose, Bld: 141 mg/dL — ABNORMAL HIGH (ref 70–99)
Potassium: 5.6 mmol/L — ABNORMAL HIGH (ref 3.5–5.1)
Potassium: 5.8 mmol/L — ABNORMAL HIGH (ref 3.5–5.1)
Sodium: 138 mmol/L (ref 135–145)
Sodium: 135 mmol/L (ref 135–145)
Total Bilirubin: 0.6 mg/dL (ref 0.3–1.2)
Total Bilirubin: 0.6 mg/dL (ref 0.3–1.2)
Total Protein: 6.6 g/dL (ref 6.5–8.1)
Total Protein: 6.6 g/dL (ref 6.5–8.1)

## 2021-04-07 LAB — CBC
HCT: 43.4 % (ref 36.0–46.0)
Hemoglobin: 12.7 g/dL (ref 12.0–15.0)
MCH: 30.6 pg (ref 26.0–34.0)
MCHC: 29.3 g/dL — ABNORMAL LOW (ref 30.0–36.0)
MCV: 104.6 fL — ABNORMAL HIGH (ref 80.0–100.0)
Platelets: 329 10*3/uL (ref 150–400)
RBC: 4.15 MIL/uL (ref 3.87–5.11)
RDW: 16.2 % — ABNORMAL HIGH (ref 11.5–15.5)
WBC: 10.3 10*3/uL (ref 4.0–10.5)
nRBC: 0.3 % — ABNORMAL HIGH (ref 0.0–0.2)

## 2021-04-07 LAB — URINALYSIS, ROUTINE W REFLEX MICROSCOPIC
Bilirubin Urine: NEGATIVE
Glucose, UA: NEGATIVE mg/dL
Hgb urine dipstick: NEGATIVE
Ketones, ur: NEGATIVE mg/dL
Leukocytes,Ua: NEGATIVE
Nitrite: NEGATIVE
Protein, ur: 100 mg/dL — AB
Specific Gravity, Urine: 1.018 (ref 1.005–1.030)
pH: 5 (ref 5.0–8.0)

## 2021-04-07 LAB — POTASSIUM
Potassium: 5.6 mmol/L — ABNORMAL HIGH (ref 3.5–5.1)
Potassium: 5.6 mmol/L — ABNORMAL HIGH (ref 3.5–5.1)
Potassium: 5.4 mmol/L — ABNORMAL HIGH (ref 3.5–5.1)
Potassium: 5.9 mmol/L — ABNORMAL HIGH (ref 3.5–5.1)

## 2021-04-07 LAB — AMMONIA: Ammonia: 43 umol/L — ABNORMAL HIGH (ref 9–35)

## 2021-04-07 LAB — TROPONIN I (HIGH SENSITIVITY): Troponin I (High Sensitivity): 86 ng/L — ABNORMAL HIGH (ref <18)

## 2021-04-07 LAB — PHOSPHORUS: Phosphorus: 7.2 mg/dL — ABNORMAL HIGH (ref 2.5–4.6)

## 2021-04-07 LAB — TSH: TSH: 12.757 u[IU]/mL — ABNORMAL HIGH (ref 0.350–4.500)

## 2021-04-07 LAB — CREATININE, URINE, RANDOM: Creatinine, Urine: 164.66 mg/dL

## 2021-04-07 LAB — MAGNESIUM: Magnesium: 2.3 mg/dL (ref 1.7–2.4)

## 2021-04-07 LAB — VITAMIN B12: Vitamin B-12: 561 pg/mL (ref 180–914)

## 2021-04-07 LAB — SODIUM, URINE, RANDOM: Sodium, Ur: 12 mmol/L

## 2021-04-07 LAB — GLUCOSE, CAPILLARY: Glucose-Capillary: 145 mg/dL — ABNORMAL HIGH (ref 70–99)

## 2021-04-07 IMAGING — CT CT ABD-PELV W/O CM
2 of 4 series · 15 of 46 positions shown, 17 images · non-contrast
Comparison: [DATE]

CLINICAL DATA: Acute abdominal pain

EXAM:
CT ABDOMEN AND PELVIS WITHOUT CONTRAST
TECHNIQUE: Multidetector CT imaging of the abdomen and pelvis was performed
following the standard protocol without IV contrast.

[Series 2: axial st · axial · 0.71mm/px · z∈[-543,-103]mm · 12 of 100 slices shown, 14 images]
[im 6/100  soft-tissue]
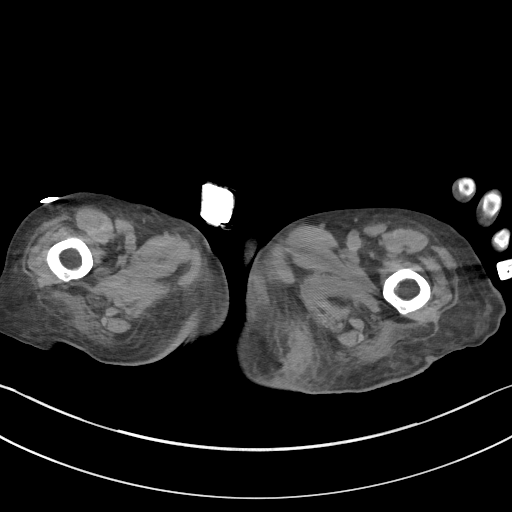
[im 6/100  bone]
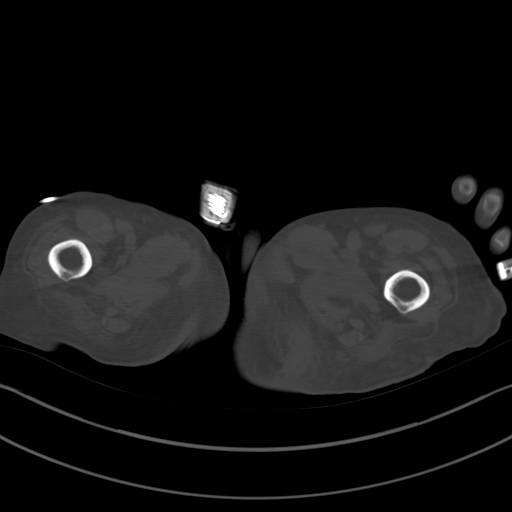
[im 17/100  soft-tissue]
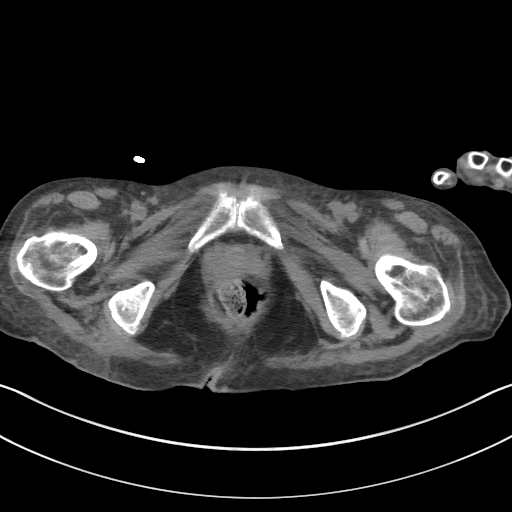
[im 23/100  soft-tissue]
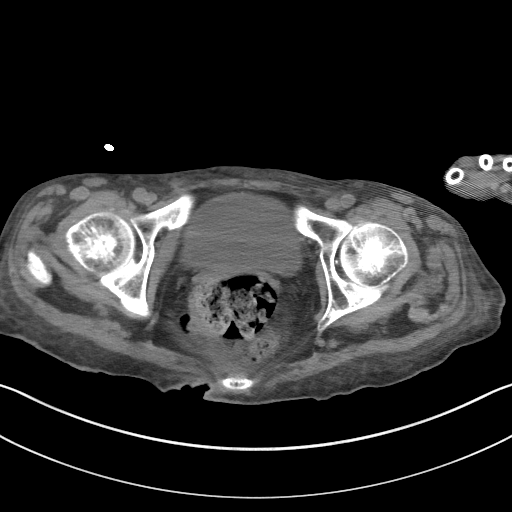
[im 28/100  soft-tissue]
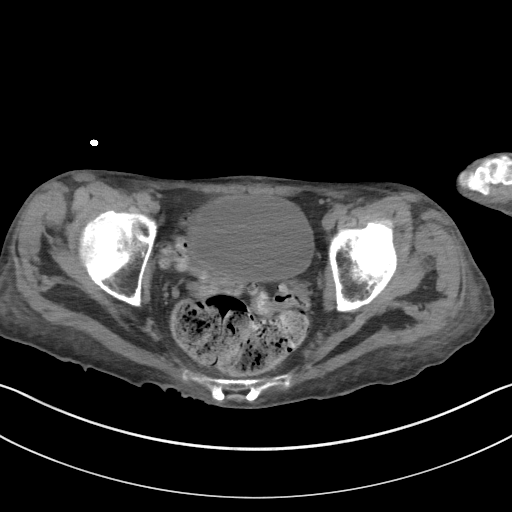
[im 39/100  soft-tissue]
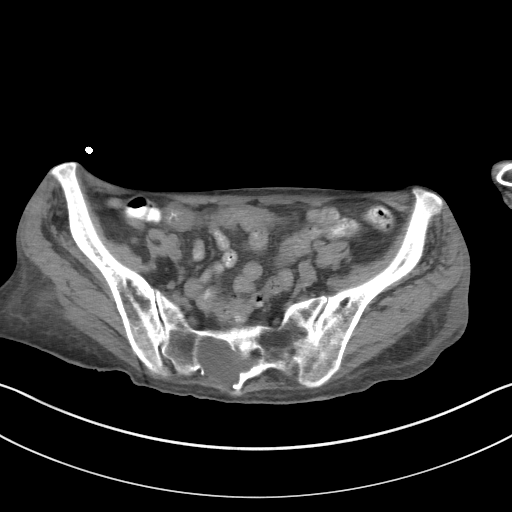
[im 45/100  soft-tissue]
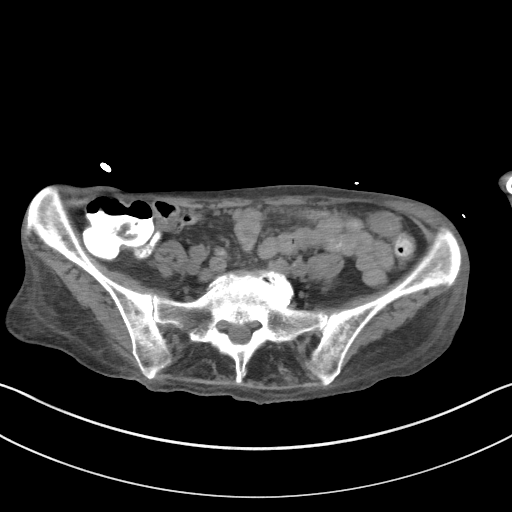
[im 56/100  soft-tissue]
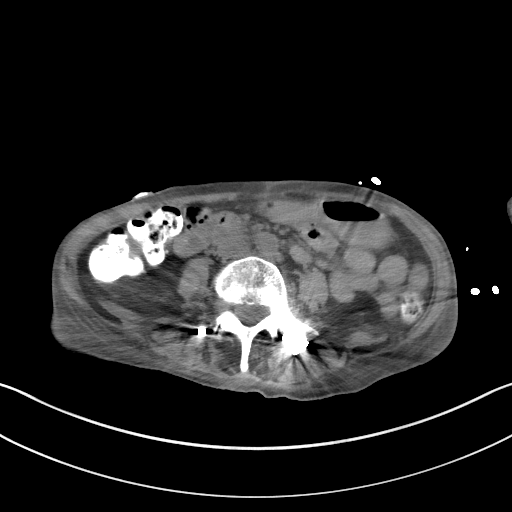
[im 61/100  soft-tissue]
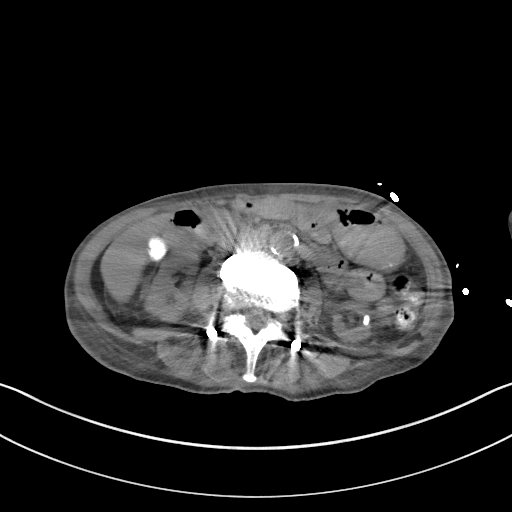
[im 72/100  soft-tissue]
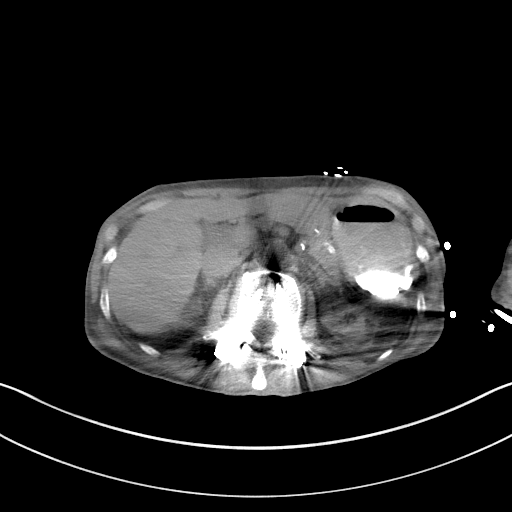
[im 72/100  bone]
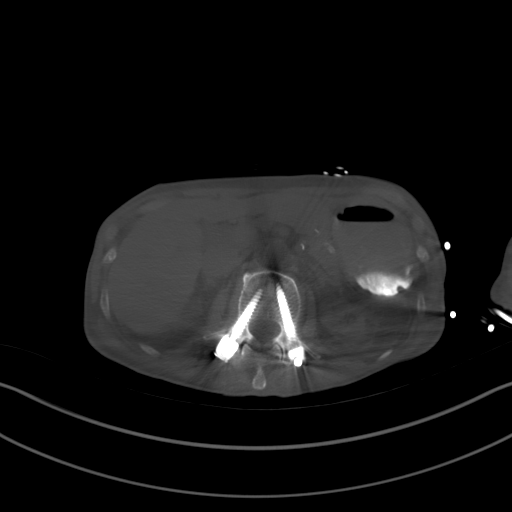
[im 78/100  soft-tissue]
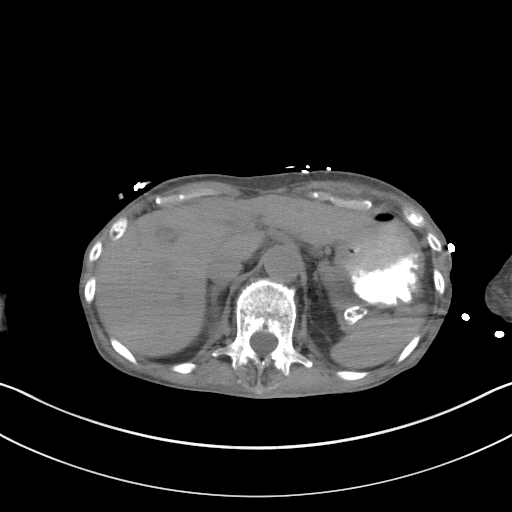
[im 83/100  soft-tissue]
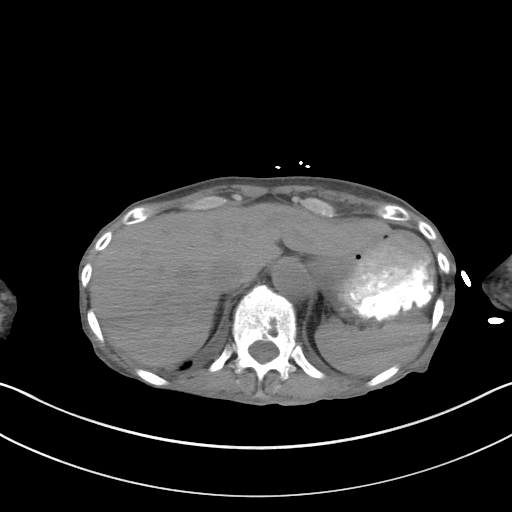
[im 94/100  soft-tissue]
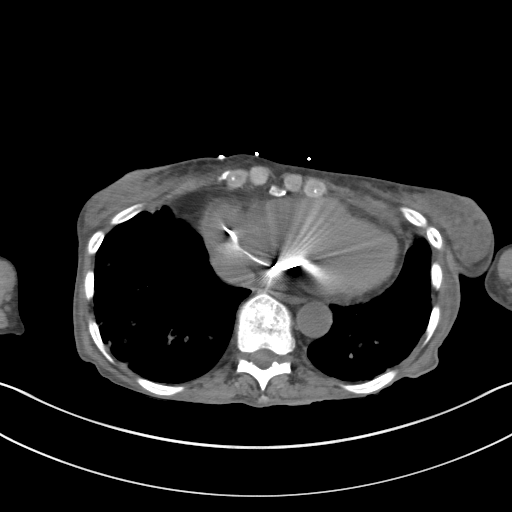

[Series 5: coronal st · coronal · 0.69mm/px · 3 of 62 slices shown]
[im 21/62  soft-tissue]
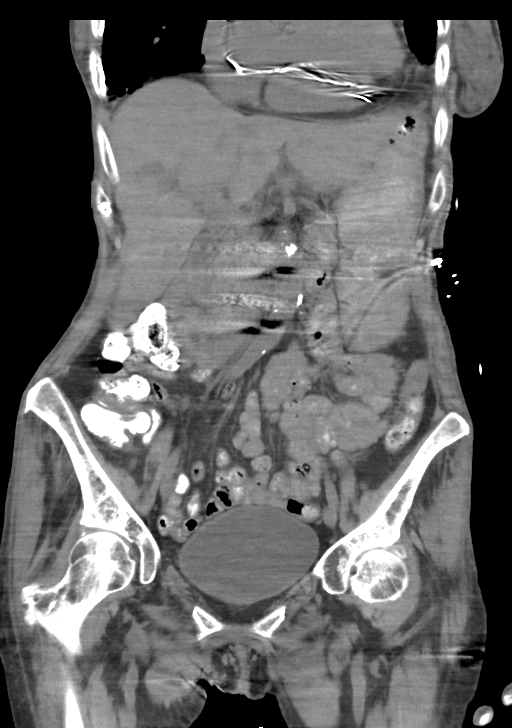
[im 28/62  soft-tissue]
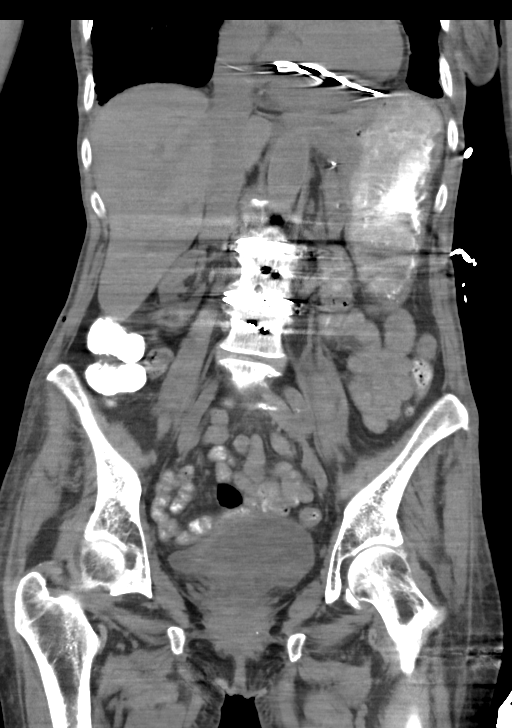
[im 34/62  soft-tissue]
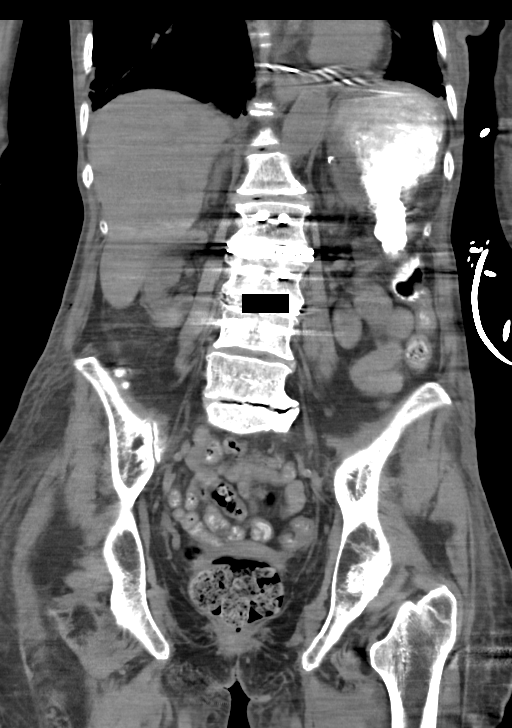

[15 of 46 positions shown; findings below may reference images not displayed]

FINDINGS: Lower chest: Lung bases again demonstrate evidence of bronchiectasis
with multiple areas of mucous plugging particularly in the right
lower lobe. The mucous plugging has progressed in the interval from
the prior exam. Left basilar atelectasis is noted new from the prior
exam.

Hepatobiliary: No focal liver abnormality is seen. No gallstones,
gallbladder wall thickening, or biliary dilatation.

Pancreas: Unremarkable. No pancreatic ductal dilatation or
surrounding inflammatory changes.

Spleen: No acute abnormality noted. Stable splenic artery aneurysm
is noted in the splenic hilum shown to be thrombosed on prior exam.

Adrenals/Urinary Tract: Adrenal glands are within normal limits.
Considerable scatter artifact is noted from the lumbar hardware
limiting evaluation of the kidneys. A few scattered nonobstructing
renal stones are noted within the left kidney. The largest of these
measures 6 mm in greatest dimension. The previously seen lesions
within the left kidney are not well appreciated on this exam due to
the lack of IV contrast. No obstructive changes are noted. Bladder
is well distended.

Stomach/Bowel: Fecal material is noted throughout the colon. No
obstructive changes are noted. The appendix is not well visualized.
No inflammatory changes to suggest appendicitis are noted. The
stomach and small bowel appear within normal limits.

Vascular/Lymphatic: Aortic atherosclerosis. No enlarged abdominal or
pelvic lymph nodes.

Reproductive: Status post hysterectomy. No adnexal masses.

Other: No abdominal wall hernia or abnormality. No abdominopelvic
ascites.

Musculoskeletal: Degenerative changes of lumbar spine are noted.
Postsurgical changes are seen from L2-L4 with interbody fusion at
both levels. Sacral perineural cyst is again identified and stable.
IMPRESSION: Stable left renal calculi without obstructive change.

The known lesions within the left kidney are not well evaluated on
this exam due to lack of IV contrast. These have been evaluated on
prior ultrasound and shown to represent simple and complex renal
cysts.

Progressive mucous plugging particularly in the right lower lobe in
areas of bronchiectasis when compared with the prior exam. New left
basilar atelectasis is noted when compare with the prior study.

No other focal abnormality is noted.

## 2021-04-07 IMAGING — CT CT HEAD CODE STROKE
3 of 4 series · 15 of 47 positions shown, 18 images · non-contrast
Comparison: Brain MRI [DATE]

CLINICAL DATA: Code stroke. Altered mental status, found
unresponsive

EXAM:
CT HEAD WITHOUT CONTRAST
TECHNIQUE: Contiguous axial images were obtained from the base of the skull
through the vertex without intravenous contrast.

[Series 3: head wo · axial · 0.47mm/px · z∈[-169,-49]mm · 9 of 30 slices shown, 12 images]
[im 3/30  brain]
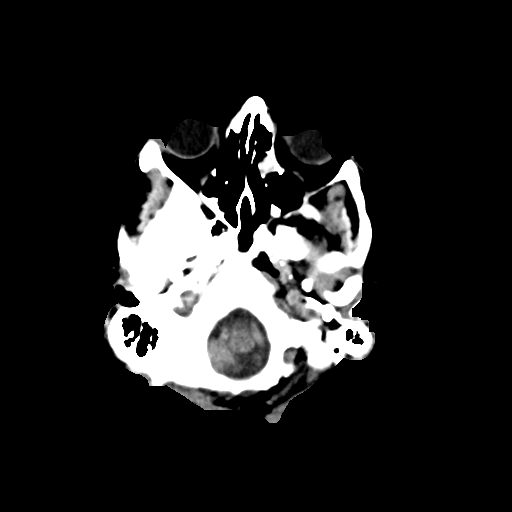
[im 3/30  bone]
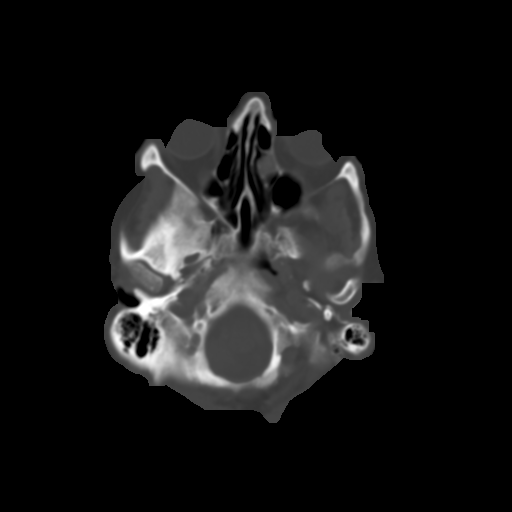
[im 7/30  brain]
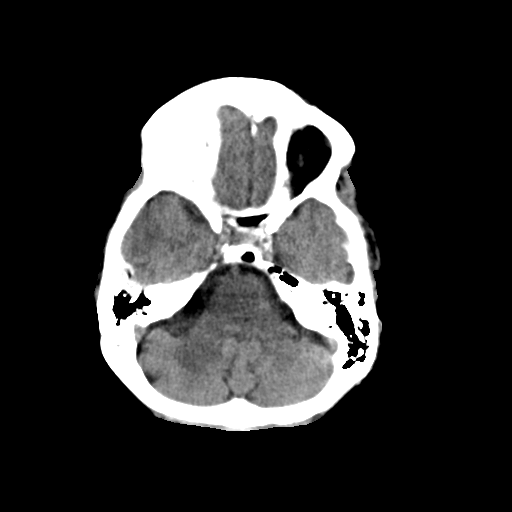
[im 9/30  brain]
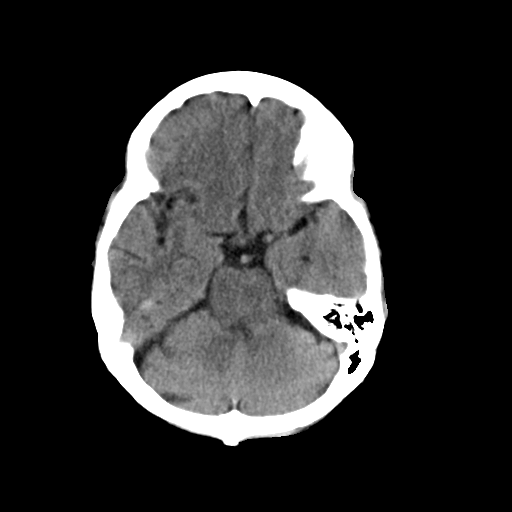
[im 13/30  brain]
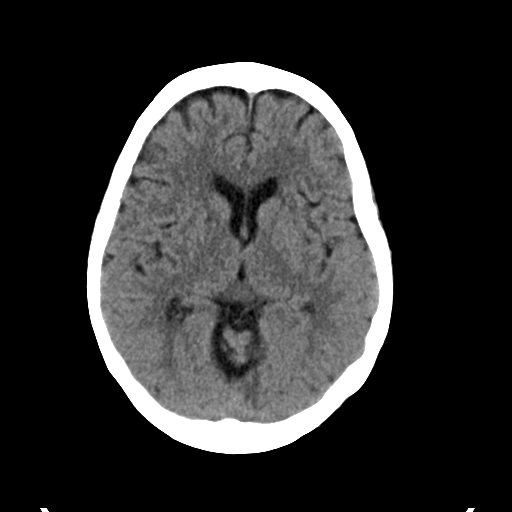
[im 15/30  brain]
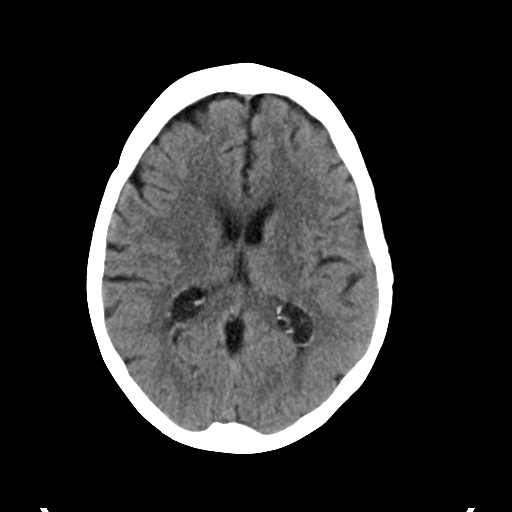
[im 15/30  bone]
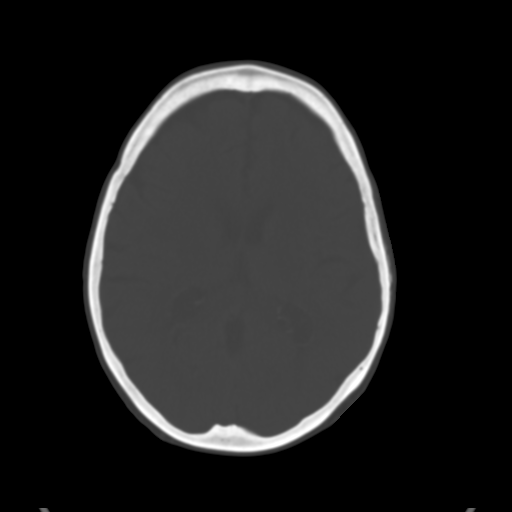
[im 17/30  brain]
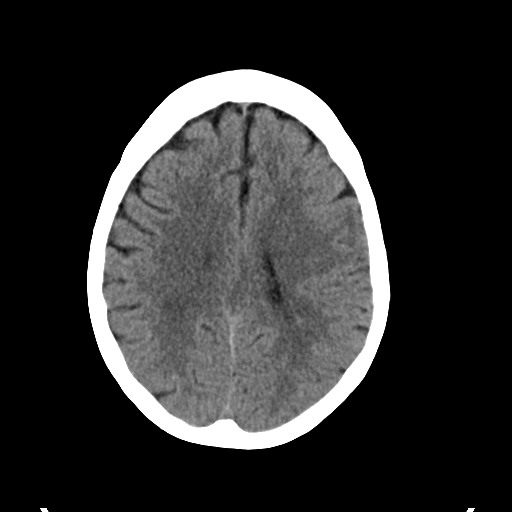
[im 21/30  brain]
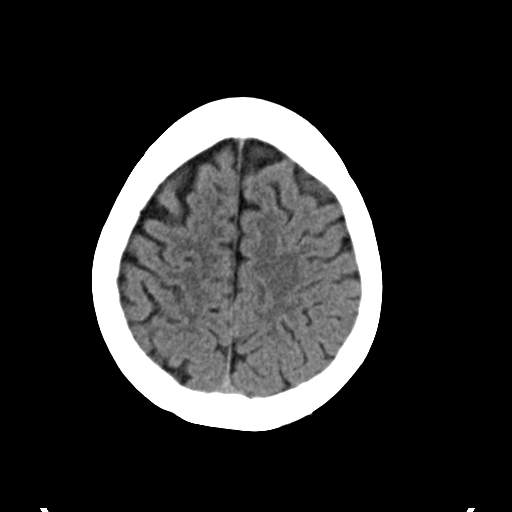
[im 23/30  brain]
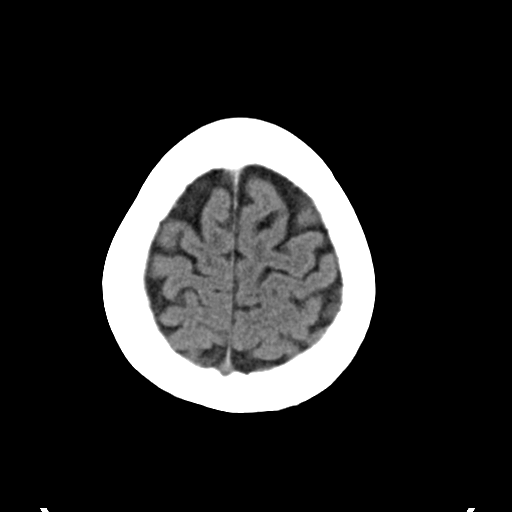
[im 27/30  brain]
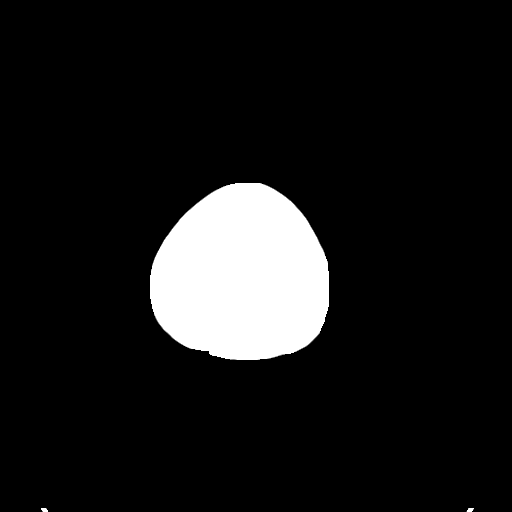
[im 27/30  bone]
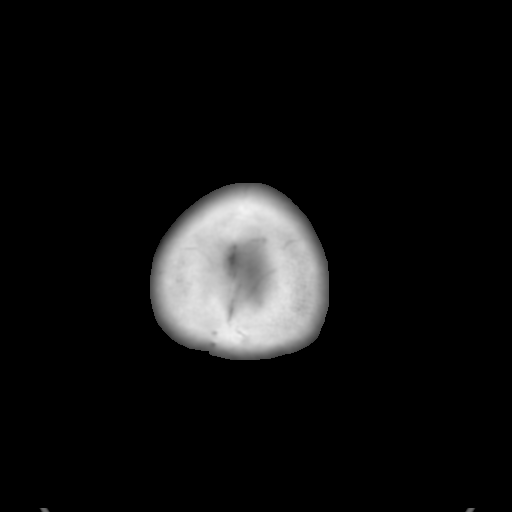

[Series 5: coronal soft tissue · coronal · 0.36mm/px · 3 of 66 slices shown]
[im 22/66  brain]
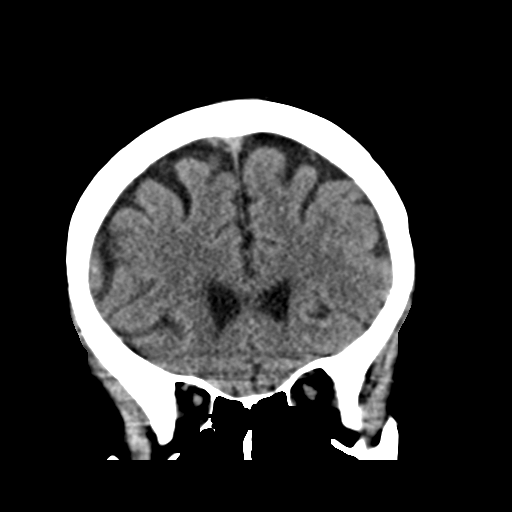
[im 29/66  brain]
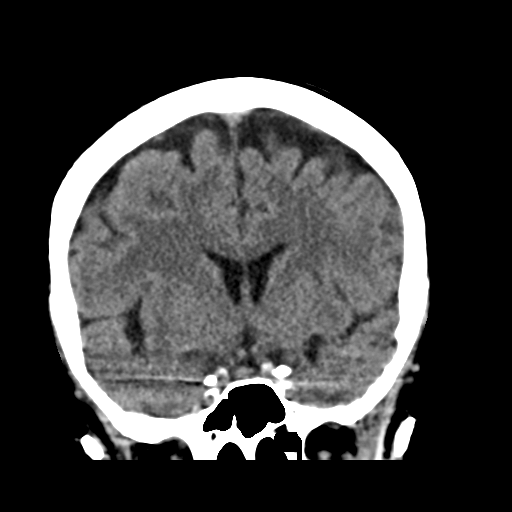
[im 37/66  brain]
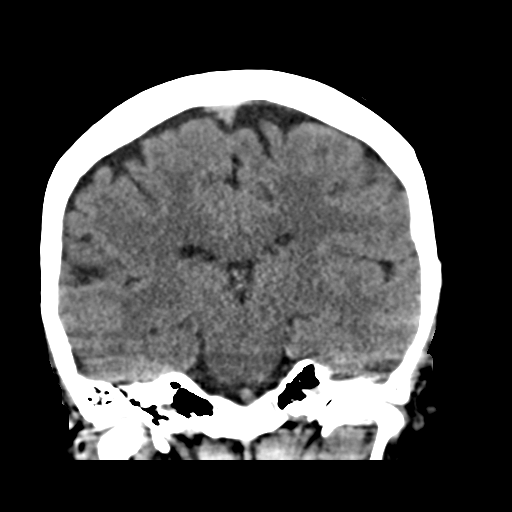

[Series 6: sagittal soft tissue · sagittal · 0.34mm/px · 3 of 52 slices shown]
[im 18/52  brain]
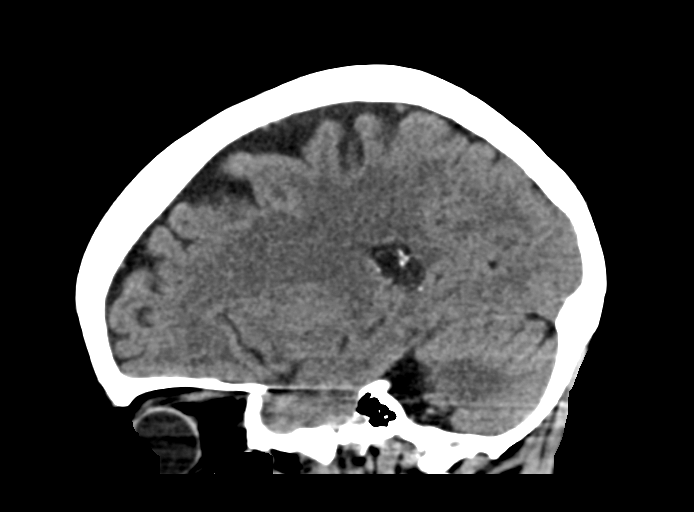
[im 26/52  brain]
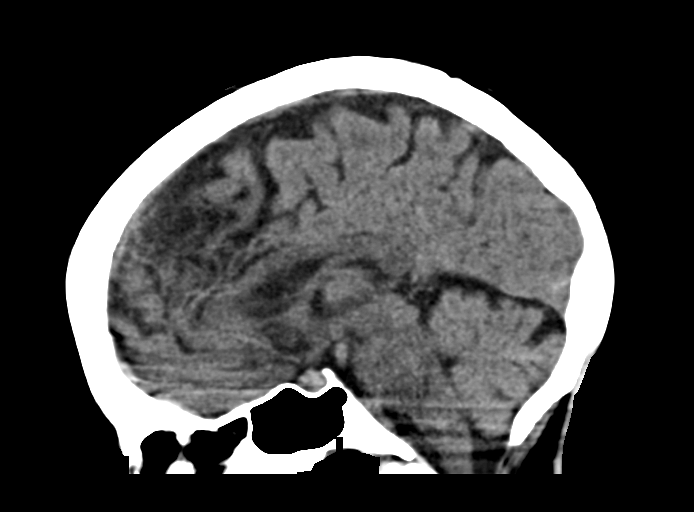
[im 35/52  brain]
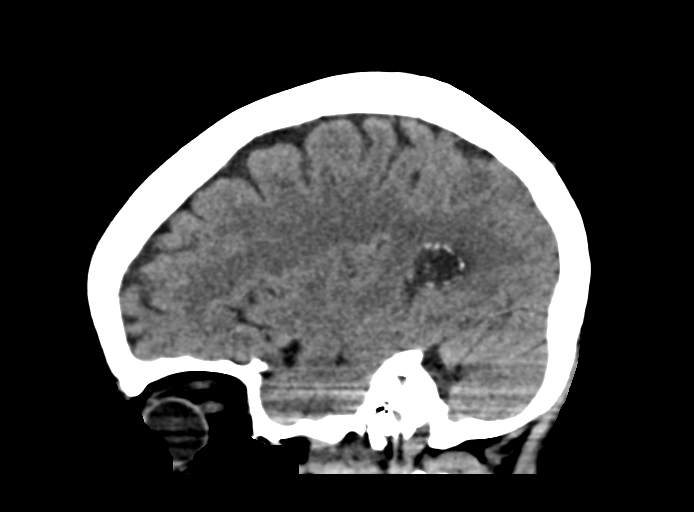

[15 of 47 positions shown; findings below may reference images not displayed]

FINDINGS: Brain: There is no evidence of acute intracranial hemorrhage,
extra-axial fluid collection, or acute infarct.

Parenchymal volume is normal. The ventricles are normal in size.
There is mild hypodensity in the periventricular white matter likely
reflecting mild chronic white matter microangiopathy.

There is no solid mass lesion.  There is no midline shift.

Vascular: No hyperdense vessel or unexpected calcification.

Skull: Normal. Negative for fracture or focal lesion.

Sinuses/Orbits: The imaged paranasal sinuses are clear. Bilateral
lens implants are in place. The globes and orbits are otherwise
unremarkable.

Other: None.

ASPECTS (Alberta Stroke Program Early CT Score)

- Ganglionic level infarction (caudate, lentiform nuclei, internal
capsule, insula, M1-M3 cortex): 7

- Supraganglionic infarction (M4-M6 cortex): 3

Total score (0-10 with 10 being normal): 10
IMPRESSION: 1. No acute intracranial pathology.
2. ASPECTS is 10

These results were paged via AMION at the time of interpretation on
[DATE] at [DATE] to provider DIMMIS .

## 2021-04-07 MED ORDER — ONDANSETRON HCL 4 MG/2ML IJ SOLN
4.0000 mg | Freq: Three times a day (TID) | INTRAMUSCULAR | Status: DC | PRN
  Administered 2021-04-07: 4 mg via INTRAVENOUS
  Filled 2021-04-07: qty 2

## 2021-04-07 MED ORDER — ENSURE ENLIVE PO LIQD
237.0000 mL | Freq: Two times a day (BID) | ORAL | Status: DC
  Administered 2021-04-07 – 2021-04-13 (×9): 237 mL via ORAL

## 2021-04-07 MED ORDER — SODIUM CHLORIDE 0.9 % IV SOLN: INTRAVENOUS | Status: DC

## 2021-04-07 MED ORDER — IPRATROPIUM BROMIDE 0.02 % IN SOLN
0.5000 mg | Freq: Four times a day (QID) | RESPIRATORY_TRACT | Status: DC
  Administered 2021-04-07 – 2021-04-09 (×8): 0.5 mg via RESPIRATORY_TRACT
  Filled 2021-04-07 (×11): qty 2.5

## 2021-04-07 MED ORDER — LEVALBUTEROL HCL 0.63 MG/3ML IN NEBU
0.6300 mg | INHALATION_SOLUTION | Freq: Four times a day (QID) | RESPIRATORY_TRACT | Status: DC
  Administered 2021-04-07 – 2021-04-09 (×8): 0.63 mg via RESPIRATORY_TRACT
  Filled 2021-04-07 (×11): qty 3

## 2021-04-07 MED ORDER — SODIUM ZIRCONIUM CYCLOSILICATE 10 G PO PACK
10.0000 g | PACK | Freq: Once | ORAL | Status: AC
  Administered 2021-04-07: 10 g via ORAL
  Filled 2021-04-07: qty 1

## 2021-04-07 MED ORDER — MIRTAZAPINE 15 MG PO TABS
15.0000 mg | ORAL_TABLET | Freq: Every day | ORAL | Status: DC
  Filled 2021-04-07: qty 1

## 2021-04-07 MED ORDER — IOHEXOL 9 MG/ML PO SOLN
500.0000 mL | ORAL | Status: AC
  Administered 2021-04-07 (×2): 500 mL via ORAL

## 2021-04-07 NOTE — Progress Notes (Signed)
Pt is injury free, afebrile, alert, and oriented X 4. Vital signs were within the baseline during this shift. Pt's oxygen saturation stable on 3L [place]. She denies SOB, nausea, vomiting, dizziness, signs or symptoms of bleeding or acute changes during this shift. We will continue to monitor and work toward achieving the care plan goals

## 2021-04-07 NOTE — Progress Notes (Signed)
Pt family came out in hallway to let the CNA know that "mom doesn't look right" @ 1629. This RN went in patient room @ 1630. Pt was not easily aroused when sternum rubbed. Patient was asked what her name was and patient attempted to respond but patient presented to have slurred speech @ 1630. Charge RN was notified. Rapid response was notified by charge RN. This RN notified MD Marland Mcalpine. MD Marland Mcalpine responded at bedside 1655.

## 2021-04-07 NOTE — Plan of Care (Signed)

## 2021-04-07 NOTE — Consult Note (Signed)
[facility] TeleNeurology Consult Services    Date of Service:  04/07/2021    Impression: Acute onset of altered mental status, now resolved. Left sided choreiform movements on exam do not appear likely to be secondary to stroke, but will need to be worked up further.     Metrics: Last Known Well: 1630 Symptoms: As per HPI.  Patient is not a candidate for thrombolytic. Low probability of stroke; symptoms/signs too mild to treat.   Location of the provider: [facility]  Location of the patient: [facility] Time Code Stroke Page received:  5:20 PM Time neurologist arrived:  5:23 PM Time NIHSS completed: 5:40 PM    This consult was provided via telemedicine with 2-way video and audio communication. The patient/family was informed that care would be provided in this way and agreed to receive care in this manner.    Assessment: 75 year old female with acute onset of delayed responses and right sided weakness with slurred speech, now resolved.  - Left sided choreiform movements on exam do not appear likely to be secondary to stroke, but will need to be worked up further for a possible underlying lesion. - Most likely etiology for her acute worsening is toxic/metabolic. Overall presentation not suggestive of stroke or TIA. No adventitious movements to suggest seizure, but a subclinical seizure is possible.     Recommendations: - Has a pacemaker. If MRI compatible, obtain MRI brain. 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 She had an elevated troponin and Cardiology was consulted. This evening, she experienced acute onset of slurred speech, right sided weakness and delayed responses. A Code Stroke was called.   CBG 144 and BP 110/80.  CT head: No acute intracranial pathology. ASPECTS is 10 There is mild hypodensity in the periventricular white matter likely reflecting mild chronic white matter microangiopathy.   Lab review: BUN 60 and Cr 2.13 with eGFR 23.   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[facility])    Pseudomonas respiratory infection    Thyroid disease      Past Surgical History: Past Surgical History:  Procedure Laterality Date   ABDOMINAL HYSTERECTOMY     BRONCHOSCOPY  2001   CARDIAC SURGERY     COLONOSCOPY  2008   CYST REMOVAL TRUNK  1965   Left axilla; benign   DILATION AND CURETTAGE OF UTERUS  1975   HAND SURGERY Right 1983   ICD IMPLANT Left 02/01/2020   PATENT DUCTUS ARTERIOUS REPAIR  1952   RIGHT HEART CATH N/A 03/23/2020   Procedure: RIGHT HEART CATH;  Surgeon: Jolaine Artist, MD;  Location: [facility];  Service: Cardiovascular;  Laterality: N/A;   TONSILLECTOMY  1956   VARICOSE VEIN SURGERY  1972      Inpatient Medications:   Current Facility-Administered Medications:    0.9 %  sodium chloride infusion, , Intravenous, Continuous, Raiford Noble Wellman, DO, Last Rate: 75 mL/hr at 04/07/21 1418, New  Bag at 04/07/21 1418   acetaminophen (TYLENOL) tablet 650 mg, 650 mg, Oral, Q6H PRN **OR** acetaminophen (TYLENOL) suppository 650 mg, 650 mg, Rectal, Q6H PRN, Marcelyn Bruins, MD   aspirin EC tablet 81 mg, 81 mg, Oral, Daily, Marcelyn Bruins, MD, 81 mg at 04/07/21 1043   doxycycline (VIBRA-TABS) tablet 100 mg, 100 mg, Oral, Q12H, Marcelyn Bruins, MD, 100 mg at 04/07/21 0533   enoxaparin  (LOVENOX) injection 30 mg, 30 mg, Subcutaneous, Q24H, Marcelyn Bruins, MD, 30 mg at 04/07/21 1050   feeding supplement (ENSURE ENLIVE / ENSURE PLUS) liquid 237 mL, 237 mL, Oral, BID BM, Marcelyn Bruins, MD, 237 mL at 04/07/21 1044   ipratropium (ATROVENT) nebulizer solution 0.5 mg, 0.5 mg, Nebulization, Q6H, Sheikh, Omair Latif, DO, 0.5 mg at 04/07/21 1215   ipratropium-albuterol (DUONEB) 0.5-2.5 (3) MG/3ML nebulizer solution 3 mL, 3 mL, Nebulization, Q4H PRN, Marcelyn Bruins, MD   levalbuterol Penne Lash) nebulizer solution 0.63 mg, 0.63 mg, Nebulization, Q6H, Sheikh, Omair Latif, DO, 0.63 mg at 04/07/21 1215   [START ON 04/09/2021] levothyroxine (SYNTHROID) tablet 50 mcg, 50 mcg, Oral, Once per day on Mon Tue Wed Thu Fri **AND** levothyroxine (SYNTHROID) tablet 100 mcg, 100 mcg, Oral, Once per day on Sun Sat, Patel, Pranav M, MD, 100 mcg at 04/07/21 0532   melatonin tablet 10 mg, 10 mg, Oral, QHS PRN, Marcelyn Bruins, MD   multivitamin with minerals tablet 1 tablet, 1 tablet, Oral, Daily, Marcelyn Bruins, MD, 1 tablet at 04/07/21 1043   pantoprazole (PROTONIX) EC tablet 20 mg, 20 mg, Oral, Daily, Marcelyn Bruins, MD   polyethylene glycol (MIRALAX / GLYCOLAX) packet 17 g, 17 g, Oral, Daily PRN, Marcelyn Bruins, MD   rosuvastatin (CRESTOR) tablet 10 mg, 10 mg, Oral, Daily, Marcelyn Bruins, MD, 10 mg at 04/07/21 1043   sodium chloride flush (NS) 0.9 % injection 3 mL, 3 mL, Intravenous, Q12H, Marcelyn Bruins, MD, 3 mL at 04/07/21 1044      Social History: Social History   Tobacco Use   Smoking status: Never    Passive exposure: Never   Smokeless tobacco: Never  Vaping Use   Vaping Use: Never used  Substance Use Topics   Alcohol use: No   Drug use: No     Family History:  Family History  Problem Relation Age of Onset   Thyroid disease Daughter      ROS: As per HPI    Anticoagulant use:  DVT chemoprophylaxis.    Antiplatelet use: ASA    Examination:   General: Elderly and frail-appearing female in NAD HEENT: [place]/AT  Mentation: Oriented to the month, that she is in the hospital, the city and the state (the latter recalled with significant difficulty. Incorrectly states the year as "2020", the day as "Friday" and the name of the hospital as "[place]"   1A: Level of Consciousness - 0 1B: Ask Month and Age - 1 1C: Blink Eyes & Squeeze Hands - 0 2: Test Horizontal Extraocular Movements - 0 3: Test Visual Fields - 0 4: Test Facial Palsy (Use Grimace if Obtunded) - 0 5A: Test Left Arm Motor Drift - 1 5B: Test Right Arm Motor Drift - 0 6A: Test Left Leg Motor Drift - 2 6B: Test Right Leg Motor Drift - 2 7: Test Limb Ataxia (FNF/Heel-Shin) - 1 8: Test Sensation -  0 9: Test Language/Aphasia - 0 10: Test Dysarthria - Severe Dysarthria: 0 11: Test Extinction/Inattention -  Extinction to bilateral simultaneous stimulation 0   NIHSS Score: 7     Patient/Family was informed the Neurology Consult would occur via TeleHealth consult by way of interactive audio and video telecommunications and consented to receiving care in this manner.   Patient is being evaluated for possible acute neurologic impairment and high pretest probability of imminent or life-threatening deterioration. I spent total of 40 minutes providing care to this patient, including time for face to face visit via telemedicine, review of medical records, imaging studies and discussion of findings with providers, the patient and/or family.   Electronically signed: Dr. Kerney Elbe

## 2021-04-07 NOTE — Consult Note (Signed)
[facility] Consult: 3:58 PM 04/07/2021  LOS: 1 day    Referring Provider: Dr Alfredia Ferguson  Primary Care Physician:  Burnett Sheng, MD Primary Gastroenterologist:  Dr. Silverio Decamp.       Reason for Consultation: Abdominal pain, weight loss.   HPI: Stacy Sanchez is a 75 y.o. female.  PMH chronic respiratory failure on chronic oxygen.  Bronchiectasis.  OSA on CPAP.  MAI infection.  Severe CHF, ICD in place.  Hypothyroidism.   No records of prior colonoscopy though may have had this many years ago.  No prior EGD.   03/06/2021 office visit with Dr. Silverio Decamp re: dysphagia to mostly solids and pills.  Chronic cough. Reported 30 pound weight loss over the year and diminished appetite.  Has not vomited but gets a bitter taste constantly in her mouth along with heartburn and regurgitation.  Chronic constipation also worsening but no melena or rectal bleeding.  GI meds included MiraLAX, no PPI or H2 blocker A CT scan of abdomen pelvis of 12/24/2020 showed bronchiectasis indeterminate lesion in the left kidney, possibly cyst or cystic neoplasm.  Nonobstructive left kidney stone.  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  Dr. Woodward Ku plan was to get a barium esophagram and MBS.  Probably plan EGD but wanted to have findings of these studies first.  He also initiated lansoprazole 30 mg tid.   03/20/2021 barium esophagram with tablet showed moderate esophageal dysmotility with poor initiation of stripping wave, barium tablet passed through the GE junction. Patient has return GI office visit with PA Lemmon on 12/21.  As of yet no confirm date for EGD.  Presented to ED for evaluation of weakness without syncope or fall, nausea, epigastric pain which has been coming and going chronically.  Pain does not radiate.  She has not  been eating or drinking well at independent living facility and has refused placement at assisted living.  Has become increasingly lethargic and more confused.  Family has not noticed significant vomiting or regurgitation.  Abdominal ultrasound: Mild wall thickening of GB but no sonographic Murphy sign, findings equivocal for acute cholecystitis, consider HIDA.  Proximal aorta mildly enlarged up to 3 cm. Portable chest x-ray shows prominent interstitial markings, bronchiectasis, atelectasis unchanged.  Not able to exclude acute airspace disease.  No pleural effusions.  Stable mild cardiomegaly.  Other than albumin low at 2.4, her LFTs and lipase are at or below normal.  Potassium elevated at 5.6.  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[place])    Pseudomonas respiratory infection    Thyroid disease     Past Surgical History:  Procedure Laterality Date   ABDOMINAL HYSTERECTOMY     BRONCHOSCOPY  2001   CARDIAC SURGERY     COLONOSCOPY  2008   CYST REMOVAL TRUNK  1965   Left axilla; benign  DILATION AND CURETTAGE OF UTERUS  1975   HAND SURGERY Right 1983   ICD IMPLANT Left 02/01/2020   PATENT DUCTUS ARTERIOUS REPAIR  1952   RIGHT HEART CATH N/A 03/23/2020   Procedure: RIGHT HEART CATH;  Surgeon: Jolaine Artist, MD;  Location: [facility];  Service: Cardiovascular;  Laterality: N/A;   [place]    Prior to Admission medications   Medication Sig Start Date End Date Taking? Authorizing Provider  acetaminophen (TYLENOL) 650 MG CR tablet Take 650 mg by mouth every 8 (eight) hours as needed for pain.    Yes [provider]  aspirin EC 81 MG tablet Take 81 mg by mouth daily.   Yes [provider]  buPROPion  (WELLBUTRIN XL) 300 MG 24 hr tablet Take 150 mg by mouth daily. 11/01/19  Yes [provider]  fluticasone (FLONASE) 50 MCG/ACT nasal spray Place 1 spray into both nostrils daily as needed for allergies or rhinitis.   Yes [provider]  gabapentin (NEURONTIN) 300 MG capsule Take 300 mg by mouth 2 (two) times daily.   Yes [provider]  ipratropium-albuterol (DUONEB) 0.5-2.5 (3) MG/3ML SOLN Take 3 mLs by nebulization 3 (three) times daily as needed. Patient taking differently: Take 3 mLs by nebulization 2 (two) times daily. 11/09/19  Yes Chesley Mires, MD  lansoprazole (PREVACID) 30 MG capsule Take 1 capsule (30 mg total) by mouth 2 (two) times daily before a meal. 03/06/21  Yes Nandigam, Venia Minks, MD  levothyroxine (SYNTHROID, LEVOTHROID) 50 MCG tablet Take 50-100 mcg by mouth See admin instructions. Take 50 mg  tablet daily on Monday through Friday  Take 100 mcg  Saturday and Sunday   Yes [provider]  mirtazapine (REMERON) 15 MG tablet Take 15 mg by mouth at bedtime. 03/23/21  Yes [provider]  Multiple Vitamin (MULTIVITAMIN WITH MINERALS) TABS tablet Take 1 tablet by mouth daily.   Yes [provider]  nadolol (CORGARD) 20 MG tablet Take 1 tablet (20 mg total) by mouth 2 (two) times daily. Patient taking differently: Take 10 mg by mouth 2 (two) times daily. 03/02/21  Yes Bensimhon, Shaune Pascal, MD  OXYGEN Inhale 2-3 L into the lungs See admin instructions. As needed and at bedtime use 3 liters with CPAP   Yes [provider]  polyethylene glycol powder (GLYCOLAX/MIRALAX) 17 GM/SCOOP powder Take 17 g by mouth daily. 07/13/09  Yes [provider]  rosuvastatin (CRESTOR) 10 MG tablet Take 10 mg by mouth daily. 10/19/18  Yes [provider]  sodium chloride HYPERTONIC 3 % nebulizer solution Take 4 mLs by nebulization 2 (two) times daily as needed for other. 11/09/19  Yes Chesley Mires, MD  Melatonin 10 MG TABS Take 1  tablet by mouth at bedtime. Patient not taking: Reported on 04/07/2021    [provider]  Nebulizers (COMPRESSOR/NEBULIZER) MISC 1 Product by Does not apply route once for 1 dose. Patient not taking: Reported on 04/07/2021 12/01/18 07/13/28  Swayze, Ava, DO    Scheduled Meds:  aspirin EC  81 mg Oral Daily   doxycycline  100 mg Oral Q12H   enoxaparin (LOVENOX) injection  30 mg Subcutaneous Q24H   feeding supplement  237 mL Oral BID BM   ipratropium  0.5 mg Nebulization Q6H   levalbuterol  0.63 mg Nebulization Q6H   [START ON 04/09/2021] levothyroxine  50 mcg Oral Once per day on Mon Tue Wed Thu  Fri   And   levothyroxine  100 mcg Oral Once per day on Sun Sat   multivitamin with minerals  1 tablet Oral Daily   pantoprazole  20 mg Oral Daily   rosuvastatin  10 mg Oral Daily   sodium chloride flush  3 mL Intravenous Q12H   Infusions:  sodium chloride 75 mL/hr at 04/07/21 1418   PRN Meds: acetaminophen **OR** acetaminophen, ipratropium-albuterol, melatonin, polyethylene glycol   Allergies as of 03/22/2021 - Review Complete 03/28/2021  Allergen Reaction Noted   Losartan Cough 05/01/2020   Amikacin Other (See Comments) 03/24/2016   Cefepime Other (See Comments) 07/17/2019   Ethambutol Other (See Comments) 03/24/2016   Levaquin [levofloxacin] Other (See Comments) 03/24/2016   Parafon forte dsc [chlorzoxazone] Hives 03/24/2016   Rifabutin Diarrhea 03/24/2016   Tequin [gatifloxacin] Other (See Comments) 03/24/2016   Zyvox [linezolid] Diarrhea 03/24/2016   Lisinopril Nausea Only 03/02/2020    Family History  Problem Relation Age of Onset   Thyroid disease Daughter     Social History   Socioeconomic History   Marital status: Widowed    Spouse name: Not on file   Number of children: Not on file   Years of education: Not on file   Highest education level: Not on file  Occupational History   Not on file  Tobacco Use   Smoking status: Never    Passive exposure: Never    Smokeless tobacco: Never  Vaping Use   Vaping Use: Never used  Substance and Sexual Activity   Alcohol use: No   Drug use: No   Sexual activity: Not Currently  Other Topics Concern   Not on file  Social History Narrative   Not on file   Social Determinants of Health   Financial Resource Strain: Not on file  Food Insecurity: Not on file  Transportation Needs: Not on file  Physical Activity: Not on file  Stress: Not on file  Social Connections: Not on file  Intimate Partner Violence: Not on file    REVIEW OF SYSTEMS: Constitutional: Weakness ENT:  No nose bleeds Pulm: Chronic cough. CV:  No palpitations, no LE edema.  GU:  No hematuria, no frequency GI: See HPI Heme: No unusual bleeding or bruising. Transfusions: None Neuro:  No headaches, no peripheral tingling or numbness.  No syncope, no seizures.  No falls. Derm:  No itching, no rash or sores.  Endocrine:  No sweats or chills.  No polyuria or dysuria Immunization: Reviewed. Travel:  None beyond local counties in last few months.    PHYSICAL EXAM: Vital signs in last 24 hours: Vitals:   04/07/21 1219 04/07/21 1458  BP:  111/74  Pulse:  84  Resp:  18  Temp:  (!) 97.4 F (36.3 C)  SpO2: 93% 96%   Wt Readings from Last 3 Encounters:  04/19/2021 46.7 kg  03/06/21 44.1 kg  03/02/21 43.2 kg    General: Patient is frail, looks chronically and acutely ill, cachectic.  Lethargic but arousable Head: No facial asymmetry or swelling. Eyes: No conjunctival pallor Ears: Hearing appears to be intact but she is not responding well to questions Nose: No discharge Mouth: Breathing through her open mouth but mucosa is moist, pink, clear.  Tongue midline Neck: No JVD, no masses, no thyromegaly. Lungs: Clear bilaterally.  No labored breathing or cough.  Weak/soft voice. Heart: RRR.  No MRG.  S1, S2 present. Abdomen: Soft.  Not tender.  No HSM, masses, bruits, hernias..   Rectal: Deferred Musc/Skeltl:  No gross joint  deformities, redness or swelling.  Overall skeletal appearance of osteoporosis. Extremities: No CCE. Neurologic: Lethargic, follows simple commands, did not test limb strength but moves all 4 limbs.  Confused.  Not able to verbalize place, name, situation. Skin: No telangiectasia, sores or rashes. Nodes: No cervical adenopathy Psych: Laconic.  Intake/Output from previous day: 12/16 0701 - 12/17 0700 In: 1004.3 [IV Piggyback:1004.3] Out: -  Intake/Output this shift: Total I/O In: 480 [P.O.:480] Out: -   LAB RESULTS: Recent Labs    03/25/2021 1259 04/07/21 0621  WBC 12.3* 10.3  HGB 13.9 12.7  HCT 46.9* 43.4  PLT 319 329   BMET Lab Results  Component Value Date   NA 138 04/07/2021   NA 141 03/22/2021   NA 138 04/07/2021   K 5.4 (H) 04/07/2021   K 5.6 (H) 04/07/2021   K 5.6 (H) 04/07/2021   CL 94 (L) 04/07/2021   CL 95 (L) 03/24/2021   CL 95 (L) 03/23/2021   CO2 36 (H) 04/07/2021   CO2 40 (H) 03/25/2021   CO2 36 (H) 03/22/2021   GLUCOSE 100 (H) 04/07/2021   GLUCOSE 104 (H) 04/11/2021   GLUCOSE 131 (H) 04/11/2021   BUN 62 (H) 04/07/2021   BUN 47 (H) 03/31/2021   BUN 45 (H) 04/20/2021   CREATININE 2.13 (H) 04/07/2021   CREATININE 1.47 (H) 04/16/2021   CREATININE 1.37 (H) 04/13/2021   CALCIUM 8.5 (L) 04/07/2021   CALCIUM 9.0 03/27/2021   CALCIUM 9.4 03/22/2021   LFT Recent Labs    04/19/2021 1259 04/07/21 0621  PROT 7.7 6.6  ALBUMIN 3.2* 2.4*  AST 60* 33  ALT 26 23  ALKPHOS 144* 110  BILITOT 0.8 0.6   PT/INR Lab Results  Component Value Date   INR 1.1 07/14/2019   Hepatitis Panel No results for input(s): HEPBSAG, HCVAB, HEPAIGM, HEPBIGM in the last 72 hours. C-Diff No components found for: CDIFF Lipase     Component Value Date/Time   LIPASE <10 (L) 04/18/2021 1545    Drugs of Abuse  No results found for: LABOPIA, COCAINSCRNUR, LABBENZ, AMPHETMU, THCU, LABBARB   RADIOLOGY STUDIES: DG Chest Portable 1 View  Result Date: 04/18/2021 CLINICAL  DATA:  Weakness, history of CHF EXAM: PORTABLE CHEST 1 VIEW COMPARISON:  CT examination and chest radiograph dated December 24, 2020. FINDINGS: The heart is mildly enlarged. Single lead AICD in place. Biapical pleural/parenchymal scarring. Bilateral prominent interstitial marking consistent with prominent bronchiectasis, unchanged. Stable bibasilar atelectasis/mild fibrotic changes. IMPRESSION: 1. Prominent bilateral interstitial markings Carroll system with bronchiectasis with mild basilar atelectasis, unchanged. Acute airspace disease can not be completely excluded. No large pleural effusion. 2.  Stable mild cardiomegaly with AICD.  No evidence of pulmonary Electronically Signed   By: Keane Police D.O.   On: 04/18/2021 13:36   US Abdomen Limited RUQ (LIVER/GB)  Result Date: 04/20/2021 CLINICAL DATA:  Right upper quadrant pain EXAM: ULTRASOUND ABDOMEN LIMITED RIGHT UPPER QUADRANT COMPARISON:  None. FINDINGS: Gallbladder: Mild gallbladder wall thickening. No gallstones visualized. No sonographic Murphy sign noted by sonographer. Common bile duct: Diameter: 3 mm Liver: No focal lesion identified. Within normal limits in parenchymal echogenicity. Portal vein is patent on color Doppler imaging with normal direction of blood flow towards the liver. 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 Recommend follow-up ultrasound every 5 years. This recommendation follows ACR consensus guidelines: White Paper of the ACR Incidental Findings Committee II on Vascular Findings. J Am Coll Radiol 2013; 10:789-794. Simple  cyst of the right kidney measuring up to 1.3 cm. IMPRESSION: 1. Mild gallbladder wall thickening with no gallstones and negative sonographic Murphy sign, findings are equivocal for acute cholecystitis. Further evaluation with HIDA scan could be considered. 2. 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 Recommend follow-up ultrasound every 5 years. This recommendation follows ACR  consensus guidelines: White Paper of the ACR Incidental Findings Committee II on Vascular Findings. J Am Coll Radiol 2013; 10:789-794. Electronically Signed   By: Yetta Glassman M.D.   On: 04/03/2021 15:54      IMPRESSION:     FTT.      Solid/pill dysphagia.  Anorexia.  Wt loss.   Evaluated a month ago by Dr. Silverio Decamp.  Subsequent esophagram showed moderate esophageal dysmotility, poor initiation of stripping wave but passage of 13 mm tablet without problems.  Epigastric pain.  Ultrasound showing nonspecific gallbladder wall thickening, cannot rule out cholecystitis.  However LFTs, lipase at or below normal  AKI with hyperkalemia.  Lethargy.  Increased confusion.    CHF, does not appear decompensted by exam or xray.   However BNP is 1633!, trop is 86.       Chronic constipation.  No change.      PLAN:       ? Pursue brain imaging?    Dr Havery Moros to follow and determine pursuing EGD.  ? Pursue HIDA, though likely low yield.     Azucena Freed  04/07/2021, 3:58 PM Phone [phone]

## 2021-04-07 NOTE — Procedures (Signed)
PROGRESS NOTE    Stacy Sanchez  IWO:[id number] DOB: [birth year] DOA: 04/04/2021 PCP: Burnett Sheng, MD   Brief Narrative:  The patient is a 75 year old chronically ill-appearing cachectic and thin Caucasian female with past medical history significant for but not limited to bronchiectasis, CAD, CKD stage III, hypertension, hypothyroidism, neuropathy, cytopenia, CHF, MAC as well as other comorbidities who presents with ongoing abdominal pain.  Patient's had epigastric abdominal pain for the last few days and this is had similar pain waxing waning for the last week or so.  Pain lasted about an hour and a half and is improved when she lays down rupture abdomen.  She had an episode of nausea without vomiting and has had a chronic cough as well.  She has had decreased p.o. intake and was planning to see GI for abdominal pain but this was rescheduled.  Was also noted to have extremely poor p.o. intake and a 30 pound weight loss in the last year or so.  Further work-up in the ED revealed that she had a hyperkalemia and elevated BUN/creatinine as well as mild leukocytosis.  BNP was elevated at 1600 however she did not appear volume overloaded.  Influenza panel and COVID were negative.  Urine blood cultures were obtained.  Chest x-ray was done and showed prominent interstitial markings and they were unable to exclude airspace disease.  Ultrasound abdomen showed mild wall thickening of the gallbladder without Murphy sign for possible cholecystitis.  She is initiated on doxycycline, low, given a liter of fluid in the ED.  She was then initiated on maintenance IV fluid hydration and given her abdominal pain GI was consulted.  She did have very minimally elevated troponin was flat but cardiology was consulted for further evaluation.  Palliative care is also been consulted.  During her hospitalization she ended up having a transient episode of slurred speech so she went for a stat head CT scan and a telemetry neurology  consult was initiated.  Assessment & Plan:   Principal Problem:   Adult failure to thrive Active Problems:   CAD (coronary artery disease)   CKD (chronic kidney disease), stage III (HCC)   Essential hypertension   Hypothyroidism   Anxiety state   Major depressive disorder with single episode, in partial remission ([place])   Memory loss of unknown cause   OSA on CPAP   Abnormal EKG   Hyperkalemia  Adult failure to thrive Generalized weakness and deconditioning Dysphagia with anorexia > Admitted for Adult failure considering patient has lost 30 pounds last year and had minimal p.o. intake with recurrent abdominal pain. - Potassium is elevated will check other electrolytes including magnesium and phosphorus. -Estimated body mass index is 15.66 kg/m as calculated from the following:   Height as of this encounter: _0  (1.727 m).   Weight as of this encounter: 46.7 kg. - Nutrition consult has been placed and pending -Given her significant abdominal pain and decreased p.o. intake a CT scan of the abdomen pelvis has been done and GIs been consulted -We will start gentle IV fluid hydration as above -PT OT consulted for further evaluation recommendation - Supportive care and palliative care has been consulted for further evaluation -On supplementation with Ensure Enlive 237 mL p.o. twice daily -Seen by GI by Dr. Rush Landmark about a month ago and had a subsequent esophagram which showed moderate esophageal dysmotility    Abdominal pain -As above -CT scan of the abdomen pelvis ordered and pending -GI consulted for further evaluation  Hyperkalemia >  Potassium elevated to 6 initially and then again on repeat.  We will continue with Lokelma and trend potassium level and stop once returning to normal. -Trend potassium - Continue Lokelma and give another dose now -Repeat labs this evening   Leukocytosis ?  Pneumonia > Noted to have mild leukocytosis 12.3 in the ED which improved to 10.3.   Also chest x-ray with inability to rule out airspace disease. > Doxycycline ordered in the ED will continue this for now. > Also ordered in the ED were urinalysis and blood cultures. - Continue with doxycycline for now -Started on Xopenex and Atrovent - Follow-up urinalysis and blood cultures - Trend fever curve and white count   EKG abnormality with minimally elevated troponins > New T wave inversions.  We will check and trend troponin.  Also notably BNP elevated. - Troponin flat at 86 and likely in the setting of demand ischemia -Cardiology was consulted given patient's family's request and they suspect demand ischemia given no CAD on cath 01/30/2020.  Patient denies any chest pain but has chronic dyspnea and is progressively worsening in the setting of chronic MAI infection and bronchiectasis -Cardiology feels that we need to continue with supportive care no indication for ischemic work-up is indicated at this time   Transient slurred speech -Initially happened and when patient was pale but now speech is improved.  Patient continues to perseverate and when asked when the year she keeps saying as of December -Code stroke was activated after my discussion with the neurologist at [place] CT without contrast has been ordered and telemetry neurology to see the patient  Chronic systolic CHF > Known history of CHF last echo was last month with EF 45-50% and grade 1 diastolic dysfunction.;  She does have an echo at [facility] which showed an EF short of 20 to 25% > BNP was elevated in the ED to greater than 1600 however patient does not appear volume overloaded and received some fluid which will resume -Her beta-blocker was held due to soft blood pressures -She is unable to tolerate losartan or lisinopril due to cough and low blood pressures -She is off of digoxin with recovery of LV function -Cardiology recommending avoiding spironolactone due to hyperkalemia in the past -We have  initiated IV fluid hydration with his gentle at 75 MLS per hour > We will monitor for any evidence of worsening respirations and consider CHF exacerbation at that time.  We will also be checking troponin as above concerning new EKG changes. - Holding home nadolol in the setting of low normal blood pressures - Not currently on daily diuretic -Cardiology recommends palliative care consultation and this has been ordered   Anxiety Depression - Continue home medications when confirmed - Reportedly on Remeron which will be resumed  Hypothyroidism - Continue home Synthroid of 150 mcg and check TSH in the morning  Hypertension with history of orthostatic hypotension -Her recent midodrine was stopped given her systolic blood pressures been elevated previously -Nadolol has been held on admission due to her softer blood pressure -Continue to monitor blood pressures per protocol  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 by bronchiectasis with recent pseudomonal infection -Continue supplemental oxygen and normally wears 2 L during the day and 2 L at night -SpO2: 100 % O2 Flow Rate (L/min): 3 L/min FiO2 (%): 32 % -Palliative care consulted for further goals of care discussion   CAD Hyperlipidemia - Continue home rosuvastatin - Continue home aspirin -Cardiology following  as above   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 > Creatinine stable at 1.37 on admission but is now worsening to 62/2.55 - Avoid nephrotoxic meds, contrast dyes, hypotension renally dose medications -Started gentle IV fluid hydration with normal saline at 75 mils per hour - Trend renal function electrolytes and will obtain a renal ultrasound, urine electrolytes as well as a urinalysis -We will consider Foley placement and will also likely discuss with nephrology in the morning if she continues to worsen   OSA - Continue home CPAP; refused last night  DVT prophylaxis: SCDs and enoxaparin 30 mg  subcu q. 24 Code Status: FULL CODE Family Communication: Discussed with daughter at bedside Disposition Plan: Pending further clinical improvement and clearance by specialist as well as palliative care discussion  Status is: Inpatient  Remains inpatient appropriate because: Failure to Thrive and Poor Po Intake   Consultants:  Cardiology Gastroenterology  Neurology   Procedures:  Head CT   Abdominal CT  U/S   Antimicrobials:  Anti-infectives (From admission, onward)    Start     Dose/Rate Route Frequency Ordered Stop   04/07/21 0600  doxycycline (VIBRA-TABS) tablet 100 mg        100 mg Oral Every 12 hours 03/28/2021 2245     04/03/2021 1700  doxycycline (VIBRA-TABS) tablet 100 mg        100 mg Oral  Once 04/03/2021 1658 04/13/2021 1745        Subjective: Seen and examined at bedside and she was still a little dyspneic and extremely weak to the point where she can even sit up in bed.  She feels extremely fatigued and tired.  Appetite has not been very good at all.  Was noncompliant with her BiPAP last night.  Denies any chest pain or shortness of breath has been having intermittent abdominal pain.  No other concerns or plans at this time.  Objective: Vitals:   04/07/21 1004 04/07/21 1219 04/07/21 1458 04/07/21 1649  BP: 108/77  111/74 (!) 114/94  Pulse: 86  84 80  Resp: _0 Temp: 98.1 F (36.7 C)  (!) 97.4 F (36.3 C)   TempSrc:      SpO2: 93% 93% 96% 100%  Weight:      Height:        Intake/Output Summary (Last 24 hours) at 04/07/2021 1752 Last data filed at 04/07/2021 1431 Gross per 24 hour  Intake 980 ml  Output --  Net 980 ml   Filed Weights   04/17/2021 1307  Weight: 46.7 kg   Examination: Physical Exam:  Constitutional: Thin frail cachectic elderly female who appears a little uncomfortable Eyes: Lids and conjunctivae normal, sclerae anicteric  ENMT: External Ears, Nose appear normal. Grossly normal hearing.  Neck: Appears normal, supple, no  cervical masses, normal ROM, no appreciable thyromegaly,: No appreciable JVD Respiratory: Diminished to auscultation bilaterally with coarse breath sounds, no wheezing, rales, rhonchi or crackles. Normal respiratory effort and patient is not tachypenic. No accessory muscle use.  Wearing supplemental oxygen via nasal cannula Cardiovascular: RRR, no murmurs / rubs / gallops. S1 and S2 auscultated. No extremity edema.  Abdomen: Soft, a little-tender, non-distended. Bowel sounds positive.  GU: Deferred. Musculoskeletal: No clubbing / cyanosis of digits/nails. No joint deformity upper and lower extremities but she does have some muscle atrophy noted is very cachectic Skin: No rashes, lesions, ulcers on limited skin evaluation. No induration; Warm and dry.  Neurologic: CN 2-12 grossly intact with no focal deficits. Romberg sign  and cerebellar reflexes not assessed.  Psychiatric: Normal judgment and insight. Alert and oriented x 3.  Anxious mood and appropriate affect.   Data Reviewed: I have personally reviewed following labs and imaging studies  CBC: Recent Labs  Lab 03/28/2021 1259 04/07/21 0621 04/07/21 1704  WBC 12.3* 10.3 10.5  NEUTROABS 9.7*  --  7.5  HGB 13.9 12.7 12.2  HCT 46.9* 43.4 41.9  MCV 101.1* 104.6* 102.7*  PLT 319 329 803   Basic Metabolic Panel: Recent Labs  Lab 03/27/2021 1259 03/31/2021 1545 04/08/2021 2222 04/07/21 0133 04/07/21 0621 04/07/21 1346 04/07/21 1704  NA 138 141  --   --  138  --  135  K 6.0* 6.0* 5.6* 5.6* 5.6*   5.6* 5.4* 5.8*   5.9*  CL 95* 95*  --   --  94*  --  93*  CO2 36* 40*  --   --  36*  --  33*  GLUCOSE 131* 104*  --   --  100*  --  141*  BUN 45* 47*  --   --  62*  --  62*  CREATININE 1.37* 1.47*  --   --  2.13*  --  2.55*  CALCIUM 9.4 9.0  --   --  8.5*  --  8.4*  MG  --   --  2.0  --   --   --  2.3  PHOS  --   --  7.5*  --   --   --  7.2*   GFR: Estimated Creatinine Clearance: 13.6 mL/min (A) (by C-G formula based on SCr of 2.55 mg/dL  (H)). Liver Function Tests: Recent Labs  Lab 03/28/2021 1259 04/07/21 0621 04/07/21 1704  AST 60* 33 27  ALT _0 ALKPHOS 144* 110 103  BILITOT 0.8 0.6 0.6  PROT 7.7 6.6 6.6  ALBUMIN 3.2* 2.4* 2.4*   Recent Labs  Lab 04/08/2021 1545  LIPASE <10*   Recent Labs  Lab 04/07/21 1704  AMMONIA 43*   Coagulation Profile: No results for input(s): INR, PROTIME in the last 168 hours. Cardiac Enzymes: No results for input(s): CKTOTAL, CKMB, CKMBINDEX, TROPONINI in the last 168 hours. BNP (last 3 results) No results for input(s): PROBNP in the last 8760 hours. HbA1C: No results for input(s): HGBA1C in the last 72 hours. CBG: Recent Labs  Lab 04/07/21 1635  GLUCAP 145*   Lipid Profile: No results for input(s): CHOL, HDL, LDLCALC, TRIG, CHOLHDL, LDLDIRECT in the last 72 hours. Thyroid Function Tests: Recent Labs    04/14/2021 1545  TSH 4.257   Anemia Panel: No results for input(s): VITAMINB12, FOLATE, FERRITIN, TIBC, IRON, RETICCTPCT in the last 72 hours. Sepsis Labs: Recent Labs  Lab 03/24/2021 1735 04/08/2021 2222  LATICACIDVEN 1.6 1.5    Recent Results (from the past 240 hour(s))  Resp Panel by RT-PCR (Flu A&B, Covid) Nasopharyngeal Swab     Status: None   Collection Time: 04/20/2021 12:59 PM   Specimen: Nasopharyngeal Swab; Nasopharyngeal(NP) swabs in vial transport medium  Result Value Ref Range Status   SARS Coronavirus 2 by RT PCR NEGATIVE NEGATIVE Final    Comment: (NOTE) SARS-CoV-2 target nucleic acids are NOT DETECTED.  The SARS-CoV-2 RNA is generally detectable in upper respiratory specimens during the acute phase of infection. The lowest concentration of SARS-CoV-2 viral copies this assay can detect is 138 copies/mL. A negative result does not preclude SARS-Cov-2 infection and should not be used as the sole basis for treatment or other  patient management decisions. A negative result may occur with  improper specimen collection/handling, submission of  specimen other than nasopharyngeal swab, presence of viral mutation(s) within the areas targeted by this assay, and inadequate number of viral copies(<138 copies/mL). A negative result must be combined with clinical observations, patient history, and epidemiological information. The expected result is Negative.  Fact Sheet for Patients:  EntrepreneurPulse.com.au  Fact Sheet for Healthcare Providers:  IncredibleEmployment.be  This test is no t yet approved or cleared by the [place] FDA and  has been authorized for detection and/or diagnosis of SARS-CoV-2 by FDA under an Emergency Use Authorization (EUA). This EUA will remain  in effect (meaning this test can be used) for the duration of the COVID-19 declaration under Section 564(b)(1) of the Act, 21 U.S.C.section 360bbb-3(b)(1), unless the authorization is terminated  or revoked sooner.       Influenza A by PCR NEGATIVE NEGATIVE Final   Influenza B by PCR NEGATIVE NEGATIVE Final    Comment: (NOTE) The Xpert Xpress SARS-CoV-2/FLU/RSV plus assay is intended as an aid in the diagnosis of influenza from Nasopharyngeal swab specimens and should not be used as a sole basis for treatment. Nasal washings and aspirates are unacceptable for Xpert Xpress SARS-CoV-2/FLU/RSV testing.  Fact Sheet for Patients: EntrepreneurPulse.com.au  Fact Sheet for Healthcare Providers: IncredibleEmployment.be  This test is not yet approved or cleared by the [place] FDA and has been authorized for detection and/or diagnosis of SARS-CoV-2 by FDA under an Emergency Use Authorization (EUA). This EUA will remain in effect (meaning this test can be used) for the duration of the COVID-19 declaration under Section 564(b)(1) of the Act, 21 U.S.C. section 360bbb-3(b)(1), unless the authorization is terminated or revoked.  Performed at [facility], [address] [place], [place] [address]   Culture, blood (routine x 2)     Status: None (Preliminary result)   Collection Time: 04/20/2021  5:30 PM   Specimen: BLOOD  Result Value Ref Range Status   Specimen Description   Final    BLOOD RIGHT ANTECUBITAL Performed at [facility], [address] [place], [place] [address]    Special Requests   Final    Blood Culture adequate volume BOTTLES DRAWN AEROBIC AND ANAEROBIC Performed at [facility], [address], [place], [place] [address]    Culture   Final    NO GROWTH < 24 HOURS Performed at [facility], [place] [address], [place],  [address]    Report Status PENDING  Incomplete    RN Pressure Injury Documentation:     Estimated body mass index is 15.66 kg/m as calculated from the following:   Height as of this encounter: _0  (1.727 m).   Weight as of this encounter: 46.7 kg.  Malnutrition Type:   Malnutrition Characteristics:   Nutrition Interventions:    Radiology Studies: DG Chest Portable 1 View  Result Date: 03/31/2021 CLINICAL DATA:  Weakness, history of CHF EXAM: PORTABLE CHEST 1 VIEW COMPARISON:  CT examination and chest radiograph dated December 24, 2020. FINDINGS: The heart is mildly enlarged. Single lead AICD in place. Biapical pleural/parenchymal scarring. Bilateral prominent interstitial marking consistent with prominent bronchiectasis, unchanged. Stable bibasilar atelectasis/mild fibrotic changes. IMPRESSION: 1. Prominent bilateral interstitial markings Carroll system with bronchiectasis with mild basilar atelectasis, unchanged. Acute airspace disease can not be completely excluded. No large pleural effusion. 2.  Stable mild cardiomegaly with AICD.  No evidence of pulmonary Electronically Signed   By: Wyatt Mage  Ahmed D.O.   On: 04/05/2021 13:36   CT HEAD CODE STROKE WO CONTRAST`  Result Date: 04/07/2021 CLINICAL DATA:  Code stroke. Altered mental status, found  unresponsive EXAM: CT HEAD WITHOUT CONTRAST TECHNIQUE: Contiguous axial images were obtained from the base of the skull through the vertex without intravenous contrast. COMPARISON:  Brain MRI 07/15/2019 FINDINGS: Brain: There is no evidence of acute intracranial hemorrhage, extra-axial fluid collection, or acute infarct. Parenchymal volume is normal. The ventricles are normal in size. There is mild hypodensity in the periventricular white matter likely reflecting mild chronic white matter microangiopathy. There is no solid mass lesion.  There is no midline shift. Vascular: No hyperdense vessel or unexpected calcification. Skull: Normal. Negative for fracture or focal lesion. Sinuses/Orbits: The imaged paranasal sinuses are clear. Bilateral lens implants are in place. The globes and orbits are otherwise unremarkable. Other: None. ASPECTS [facility] Stroke Program Early CT Score) - Ganglionic level infarction (caudate, lentiform nuclei, internal capsule, insula, M1-M3 cortex): 7 - Supraganglionic infarction (M4-M6 cortex): 3 Total score (0-10 with 10 being normal): 10 IMPRESSION: 1. No acute intracranial pathology. 2. ASPECTS is 10 These results were paged via AMION at the time of interpretation on 04/07/2021 at 5:30 pm to provider [facility] . Electronically Signed   By: Valetta Mole M.D.   On: 04/07/2021 17:36   US Abdomen Limited RUQ (LIVER/GB)  Result Date: 04/20/2021 CLINICAL DATA:  Right upper quadrant pain EXAM: ULTRASOUND ABDOMEN LIMITED RIGHT UPPER QUADRANT COMPARISON:  None. FINDINGS: Gallbladder: Mild gallbladder wall thickening. No gallstones visualized. No sonographic Murphy sign noted by sonographer. Common bile duct: Diameter: 3 mm Liver: No focal lesion identified. Within normal limits in parenchymal echogenicity. Portal vein is patent on color Doppler imaging with normal direction of blood flow towards the liver. 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 Recommend follow-up  ultrasound every 5 years. This recommendation follows ACR consensus guidelines: White Paper of the ACR Incidental Findings Committee II on Vascular Findings. J Am Coll Radiol 2013; 10:789-794. Simple cyst of the right kidney measuring up to 1.3 cm. IMPRESSION: 1. Mild gallbladder wall thickening with no gallstones and negative sonographic Murphy sign, findings are equivocal for acute cholecystitis. Further evaluation with HIDA scan could be considered. 2. 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 Recommend follow-up ultrasound every 5 years. This recommendation follows ACR consensus guidelines: White Paper of the ACR Incidental Findings Committee II on Vascular Findings. J Am Coll Radiol 2013; 10:789-794. Electronically Signed   By: Yetta Glassman M.D.   On: 04/10/2021 15:54    Scheduled Meds:  aspirin EC  81 mg Oral Daily   doxycycline  100 mg Oral Q12H   enoxaparin (LOVENOX) injection  30 mg Subcutaneous Q24H   feeding supplement  237 mL Oral BID BM   ipratropium  0.5 mg Nebulization Q6H   levalbuterol  0.63 mg Nebulization Q6H   [START ON 04/09/2021] levothyroxine  50 mcg Oral Once per day on Mon Tue Wed Thu Fri   And   levothyroxine  100 mcg Oral Once per day on Sun Sat   multivitamin with minerals  1 tablet Oral Daily   pantoprazole  20 mg Oral Daily   rosuvastatin  10 mg Oral Daily   sodium chloride flush  3 mL Intravenous Q12H   Continuous Infusions:  sodium chloride 75 mL/hr at 04/07/21 1418    LOS: 1 day   Kerney Elbe, DO [facility] PAGER is on AMION  If  7PM-7AM, please contact night-coverage [email or web address]

## 2021-04-07 NOTE — Consult Note (Addendum)
Cardiology Consultation:   Patient ID: Stacy Sanchez MRN: [id number]; DOB: 1945/10/15  Admit date: 04/13/2021 Date of Consult: 04/07/2021  PCP:  Burnett Sheng, MD   [facility] Providers Cardiologist:  None        Patient Profile:   Stacy Sanchez is a 75 y.o. 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 Alfredia Ferguson.  History of Present Illness:   Stacy Sanchez is a 75 year old female with history detailed above who is followed by Dr. Haroldine Laws in clinic. Per review of his notes, the patient was admitted to [facility] in 10/21 with syncopal spell and diagnosed with new nonischemic cardiomyopathy. Echocardiogram showed EF 20-25%, normal RV, LVIDD 4.6 cm. (Previous echocardiogram 2019 showed EF 50-55%, and either a torn chordae or redundant chordae by report.) Cardiac cath revealed no CAD. EKG showed sinus rhythm with T wave abnormalities with narrow QRS. CT and MRI of head were unremarkable. EP was consulted and ICD was placed. Underwent RHC on 03/23/20 which showed well compensated hemodynamics with very mild PAH. Zio patch 12/21 showed primarily sinus rhythm - avg HR of 101 bpm. +NSVT Rare PACs and PVCs. TTE 02/2021 with EF 45-50%. Last saw Bensimhon in clinic on 03/02/21 where she was continuing to lose weight. Was weaned off midodrine with elevated pressures.   She presented to the ER yesterday with epigastric pain that had been ongoing for the past week. Work-up in the ER notably for K 6, Cr 1.37 (baseline), WBC 12.3, BNP 1600. Ultrasound of the abdomen with mild wall thickening of the gallbladder without Murphy sign equivocal for possible cholecystitis. Patient received doxycycline, Lokelma, and a liter of fluids in the ED.  Trop was also checked due  to TWI on ECG and was found to be 86>86. Cardiology is now consulted for elevated troponin.  On arrival to the patient's room, a code stroke was being called for transient episode of slurred speech. This had resolved by my time of arrival, however, the patient was more somnolent on exam. Currently, she denies any chest pain, palpitations, lightheadedness or focal weakness. Has chronic SOB that has progressed over the past several months. States she has had poor PO intake with her abdominal pain.     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[place])    Pseudomonas respiratory infection    Thyroid disease     Past Surgical History:  Procedure Laterality Date   ABDOMINAL HYSTERECTOMY     BRONCHOSCOPY  2001   CARDIAC SURGERY     COLONOSCOPY  2008   CYST REMOVAL TRUNK  1965   Left axilla; benign   DILATION AND CURETTAGE OF UTERUS  1975   HAND SURGERY Right 1983   ICD IMPLANT Left 02/01/2020   PATENT DUCTUS ARTERIOUS REPAIR  1952   RIGHT HEART CATH N/A 03/23/2020   Procedure: RIGHT HEART CATH;  Surgeon: Jolaine Artist, MD;  Location: [facility];  Service: Cardiovascular;  Laterality: N/A;   TONSILLECTOMY  1956   VARICOSE VEIN SURGERY  1972     Home Medications:  Prior to Admission medications   Medication Sig Start Date End  Date Taking? Authorizing Provider  acetaminophen (TYLENOL) 650 MG CR tablet Take 650 mg by mouth every 8 (eight) hours as needed for pain.    Yes [provider]  aspirin EC 81 MG tablet Take 81 mg by mouth daily.   Yes [provider]  buPROPion (WELLBUTRIN XL) 300 MG 24 hr tablet Take 150 mg by mouth daily. 11/01/19  Yes [provider]  fluticasone (FLONASE) 50 MCG/ACT nasal spray Place 1 spray into both nostrils daily as needed for allergies or rhinitis.   Yes [provider]   gabapentin (NEURONTIN) 300 MG capsule Take 300 mg by mouth 2 (two) times daily.   Yes [provider]  ipratropium-albuterol (DUONEB) 0.5-2.5 (3) MG/3ML SOLN Take 3 mLs by nebulization 3 (three) times daily as needed. Patient taking differently: Take 3 mLs by nebulization 2 (two) times daily. 11/09/19  Yes Chesley Mires, MD  lansoprazole (PREVACID) 30 MG capsule Take 1 capsule (30 mg total) by mouth 2 (two) times daily before a meal. 03/06/21  Yes Nandigam, Venia Minks, MD  levothyroxine (SYNTHROID, LEVOTHROID) 50 MCG tablet Take 50-100 mcg by mouth See admin instructions. Take 50 mg  tablet daily on Monday through Friday  Take 100 mcg  Saturday and Sunday   Yes [provider]  mirtazapine (REMERON) 15 MG tablet Take 15 mg by mouth at bedtime. 03/23/21  Yes [provider]  Multiple Vitamin (MULTIVITAMIN WITH MINERALS) TABS tablet Take 1 tablet by mouth daily.   Yes [provider]  nadolol (CORGARD) 20 MG tablet Take 1 tablet (20 mg total) by mouth 2 (two) times daily. Patient taking differently: Take 10 mg by mouth 2 (two) times daily. 03/02/21  Yes Bensimhon, Shaune Pascal, MD  OXYGEN Inhale 2-3 L into the lungs See admin instructions. As needed and at bedtime use 3 liters with CPAP   Yes [provider]  polyethylene glycol powder (GLYCOLAX/MIRALAX) 17 GM/SCOOP powder Take 17 g by mouth daily. 07/13/09  Yes [provider]  rosuvastatin (CRESTOR) 10 MG tablet Take 10 mg by mouth daily. 10/19/18  Yes [provider]  sodium chloride HYPERTONIC 3 % nebulizer solution Take 4 mLs by nebulization 2 (two) times daily as needed for other. 11/09/19  Yes Chesley Mires, MD  Melatonin 10 MG TABS Take 1 tablet by mouth at bedtime. Patient not taking: Reported on 04/07/2021    [provider]  Nebulizers (COMPRESSOR/NEBULIZER) MISC 1 Product by Does not apply route once for 1 dose. Patient not taking: Reported on 04/07/2021 12/01/18 07/13/28  Karie Kirks, DO    Inpatient Medications: Scheduled Meds:  aspirin EC  81 mg Oral Daily   doxycycline  100 mg Oral Q12H   enoxaparin (LOVENOX) injection  30 mg Subcutaneous Q24H   feeding supplement  237 mL Oral BID BM   ipratropium  0.5 mg Nebulization Q6H   levalbuterol  0.63 mg Nebulization Q6H   [START ON 04/09/2021] levothyroxine  50 mcg Oral Once per day on Mon Tue Wed Thu Fri   And   levothyroxine  100 mcg Oral Once per day on Sun Sat   multivitamin with minerals  1 tablet Oral Daily   pantoprazole  20 mg Oral Daily   rosuvastatin  10 mg Oral Daily   sodium chloride flush  3 mL Intravenous Q12H   Continuous Infusions:  sodium chloride 75 mL/hr at 04/07/21 1418   PRN Meds: acetaminophen **OR** acetaminophen, ipratropium-albuterol, melatonin, polyethylene glycol  Allergies:    Allergies  Allergen Reactions   Losartan Cough   Amikacin Other (See Comments)    nephropathy    Cefepime Other (See Comments)    Tremors Slurred speech  AKI   Ethambutol Other (See Comments)    neuropathy   Levaquin [Levofloxacin] Other (See Comments)    Fainting and weakness   Parafon Forte Dsc [Chlorzoxazone] Hives   Rifabutin Diarrhea   Tequin [Gatifloxacin] Other (See Comments)    Fainting and weakness   Zyvox [Linezolid] Diarrhea   Lisinopril Nausea Only    Very bitter taste in mouth caused anorexia    Social History:   Social History   Socioeconomic History   Marital status: Widowed    Spouse name: Not on file   Number of children: Not on file   Years of education: Not on file   Highest education level: Not on file  Occupational History   Not on file  Tobacco Use   Smoking status: Never    Passive exposure: Never   Smokeless tobacco: Never  Vaping Use   Vaping Use: Never used  Substance and Sexual Activity   Alcohol use: No   Drug use: No   Sexual activity: Not Currently  Other Topics Concern   Not on file  Social History Narrative   Not on file   Social Determinants  of Health   Financial Resource Strain: Not on file  Food Insecurity: Not on file  Transportation Needs: Not on file  Physical Activity: Not on file  Stress: Not on file  Social Connections: Not on file  Intimate Partner Violence: Not on file    Family History:    Family History  Problem Relation Age of Onset   Thyroid disease Daughter      ROS:  Please see the history of present illness.   All other ROS reviewed and negative.     Physical Exam/Data:   Vitals:   04/07/21 0600 04/07/21 1004 04/07/21 1219 04/07/21 1458  BP: 111/68 108/77  111/74  Pulse: 84 86  84  Resp:  18  18  Temp: (!) 97.4 F (36.3 C) 98.1 F (36.7 C)  (!) 97.4 F (36.3 C)  TempSrc: Oral     SpO2: 97% 93% 93% 96%  Weight:      Height:        Intake/Output Summary (Last 24 hours) at 04/07/2021 1632 Last data filed at 04/07/2021 1431 Gross per 24 hour  Intake 980 ml  Output --  Net 980 ml   Last 3 Weights 04/02/2021 03/06/2021 03/02/2021  Weight (lbs) 103 lb 97 lb 2 oz 95 lb 3.2 oz  Weight (kg) 46.72 kg 44.056 kg 43.182 kg     Body mass index is 15.66 kg/m.  General:  Frail, very thin, somnolent but responding to questions and commands HEENT: normal Neck: no JVD Vascular: No carotid bruits; Distal pulses 2+ bilaterally Cardiac:  RR, no murmur Lungs:  Diminished but clear Abd:  thin, soft Ext: no edema Musculoskeletal:  No deformities, BUE and BLE strength normal and equal Skin: warm and dry  Neuro:  Able to follow commands, no focal weakness, disoriented (thinks she is at [facility]; responds December when asked what year it is) Psych:  Normal affect   EKG:  The EKG was personally reviewed and demonstrates:  NSR, biatrial enlargement, poor r-wave progression, non-specific ST-T wave changes Telemetry:  Telemetry was personally reviewed and demonstrates:  NSR  Relevant CV Studies: TTE 02/2021: IMPRESSIONS   1. Left ventricular ejection fraction, by  estimation, is 45 to 50%. The  left  ventricle has mildly decreased function. The left ventricle has no  regional wall motion abnormalities. Left ventricular diastolic parameters  are consistent with Grade I  diastolic dysfunction (impaired relaxation).   2. Right ventricular systolic function is normal. The right ventricular  size is normal. There is normal pulmonary artery systolic pressure.   3. The mitral valve is normal in structure. No evidence of mitral valve  regurgitation. No evidence of mitral stenosis.   4. The aortic valve is tricuspid. Aortic valve regurgitation is mild. No  aortic stenosis is present.   5. Aortic dilatation noted. There is mild dilatation of the ascending  aorta, measuring 40 mm.   6. The inferior vena cava is normal in size with greater than 50%  respiratory variability, suggesting right atrial pressure of 3 mmHg.  - RHC 12/21 RA = 2 RV = 33/2 PA = 35/10 (22) PCW = 6 Fick cardiac output/index = 4.1/2.6 PVR = 3.8 WU Ao sat = 94% PA sat = 61%, 60%  Laboratory Data:  High Sensitivity Troponin:   Recent Labs  Lab 04/04/2021 2222 04/07/21 0133  TROPONINIHS 86* 86*     Chemistry Recent Labs  Lab 03/24/2021 1259 04/05/2021 1545 03/30/2021 2222 04/07/21 0133 04/07/21 0621 04/07/21 1346  NA 138 141  --   --  138  --   K 6.0* 6.0* 5.6* 5.6* 5.6*   5.6* 5.4*  CL 95* 95*  --   --  94*  --   CO2 36* 40*  --   --  36*  --   GLUCOSE 131* 104*  --   --  100*  --   BUN 45* 47*  --   --  62*  --   CREATININE 1.37* 1.47*  --   --  2.13*  --   CALCIUM 9.4 9.0  --   --  8.5*  --   MG  --   --  2.0  --   --   --   GFRNONAA 40* 37*  --   --  23*  --   ANIONGAP 7 6  --   --  8  --     Recent Labs  Lab 04/03/2021 1259 04/07/21 0621  PROT 7.7 6.6  ALBUMIN 3.2* 2.4*  AST 60* 33  ALT 26 23  ALKPHOS 144* 110  BILITOT 0.8 0.6   Lipids No results for input(s): CHOL, TRIG, HDL, LABVLDL, LDLCALC, CHOLHDL in the last 168 hours.  Hematology Recent Labs  Lab 04/16/2021 1259 04/07/21 0621  WBC  12.3* 10.3  RBC 4.64 4.15  HGB 13.9 12.7  HCT 46.9* 43.4  MCV 101.1* 104.6*  MCH 30.0 30.6  MCHC 29.6* 29.3*  RDW 16.0* 16.2*  PLT 319 329   Thyroid  Recent Labs  Lab 04/16/2021 1545  TSH 4.257    BNP Recent Labs  Lab 03/24/2021 1259  BNP 1,633.0*    DDimer No results for input(s): DDIMER in the last 168 hours.   Radiology/Studies:  DG Chest Portable 1 View  Result Date: 04/12/2021 CLINICAL DATA:  Weakness, history of CHF EXAM: PORTABLE CHEST 1 VIEW COMPARISON:  CT examination and chest radiograph dated December 24, 2020. FINDINGS: The heart is mildly enlarged. Single lead AICD in place. Biapical pleural/parenchymal scarring. Bilateral prominent interstitial marking consistent with prominent bronchiectasis, unchanged. Stable bibasilar atelectasis/mild fibrotic changes. IMPRESSION: 1. Prominent bilateral interstitial markings Carroll system with bronchiectasis with mild basilar atelectasis, unchanged. Acute airspace disease  can not be completely excluded. No large pleural effusion. 2.  Stable mild cardiomegaly with AICD.  No evidence of pulmonary Electronically Signed   By: Keane Police D.O.   On: 03/23/2021 13:36   US Abdomen Limited RUQ (LIVER/GB)  Result Date: 03/28/2021 CLINICAL DATA:  Right upper quadrant pain EXAM: ULTRASOUND ABDOMEN LIMITED RIGHT UPPER QUADRANT COMPARISON:  None. FINDINGS: Gallbladder: Mild gallbladder wall thickening. No gallstones visualized. No sonographic Murphy sign noted by sonographer. Common bile duct: Diameter: 3 mm Liver: No focal lesion identified. Within normal limits in parenchymal echogenicity. Portal vein is patent on color Doppler imaging with normal direction of blood flow towards the liver. 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 Recommend follow-up ultrasound every 5 years. This recommendation follows ACR consensus guidelines: White Paper of the ACR Incidental Findings Committee II on Vascular Findings. J Am Coll Radiol  2013; 10:789-794. Simple cyst of the right kidney measuring up to 1.3 cm. IMPRESSION: 1. Mild gallbladder wall thickening with no gallstones and negative sonographic Murphy sign, findings are equivocal for acute cholecystitis. Further evaluation with HIDA scan could be considered. 2. 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 Recommend follow-up ultrasound every 5 years. This recommendation follows ACR consensus guidelines: White Paper of the ACR Incidental Findings Committee II on Vascular Findings. J Am Coll Radiol 2013; 10:789-794. Electronically Signed   By: Yetta Glassman M.D.   On: 03/26/2021 15:54     Assessment and Plan:   #Elevated troponin: -Remained flat at 86; suspect demand as no CAD on cath in 01/2020 -Patient denies chest pain; has chronic dyspnea that has been progressively worsening in the setting of chronic MAI infection and brochiectasis -Continue supportive care with no indication for ischemic work-up at this time; patient is a poor cath candidate due to significant comorbidities -Continue ASA 44m daily and crestor 183m #Transient Slurred Speech: -Speech back to normal on my evaluation -Code stroke activated -Plan for CT head -Neuro consulted  #Physical Deconditioning/FTT -Patient is very frail with BMI 15.66 -Has continued to lose weight despite attempts at increasing caloric intake -Recommend palliative care consultation  #Systolic [place] - [facility]  [facility]  - s/p Biotronik ICD in setting of syncopal episode with presumed VT. Now following with EP. - RHC 12/21 - well compensated. - Echo 05/01/20 EF 45-50% RV okay - Echo 03/02/21 EF 45-50% RV okay - Nadolol held due to soft blood pressures - Unable to tolerate losartan or lisinopril due to cough and low BP - Off digoxin with recovery of LV function. - Avoiding spironolactone due to hyperK on admission  and in the past - While BNP elevated, patient appears volume down in the setting of poor PO intake - Palliative care consult as above  #Possible PNA: -Management per primary -On doxy  #Abdominal Pain: -Ultrasound with nonspecific gallbladder thickening -GI consulted -CT abdomen pending   #[facility] syncope [facility]  - Recently established with Dr. CaCurt Bears0/22 and enrolled in device clinic   #Chronic hypoxic respiratory failure severe in setting of MAI infection complicated by bronchiectasis with recurrent Pseudomonal infections. - On chronic home O2 with progressive worsening of dyspnea - Recommend palliative care consult as above   #Hypertension w/ orthostatic hypotension - Midodrine recently stopped d/t SBP 160s-180s - Nadolol held on admission due to soft blood pressures   #Severe  OSA - on CPAP.  - Follows with Dr. Halford Chessman   #H/o patent ductus repair   #Anxiety/ Depression  - On Lexapro + Wellbutrin. - Management per PCP. She has been referred to a geriatrician in town.      Risk Assessment/Risk Scores:        New York Heart Association (NYHA) Functional Class NYHA Class IV        For questions or updates, please contact [facility] Please consult [email or web address] for contact info under    Signed, Freada Bergeron, MD  04/07/2021 4:32 PM

## 2021-04-07 NOTE — Significant Event (Signed)
Rapid Response Event Note   Reason for Call : Unresponsiveness, AMS Notified by [facility] charge RN that patient was hard to arouse and appearing to be unresponsive. Upon arrival, vital signs are stable. Patient able to tell me her name. She appears very weak, fatigued, and pale.    Initial Focused Assessment:  Neuro: Drowsy, able to follow commands, oriented to self, place, situation, disoriented to time. Right and left extremities equal in strength, left arm and left leg having tremors and jerking movements. Last Known well at approximately 1630 per bedside RN.  Cardiac: NSR, BP WNL see vital signs via chart  Pulmonary: [place] 3L, O2 Sats 100%, No respiratory distress  Interventions:  MD Marland Mcalpine notified in regards to change in mental status by bedside RN, Code Stroke initiated and labs ordered per MD Loura Back Neuro activated, Caryl Pina MD responding to Code Stroke call CT scan of head completed per Code Stroke, CT of abdomen and pelvis completed that was scheduled as routine Plan of Care:  MD Lindzen conducted NIHSS and reviewed CT scan of head and discussed with patient low probability of stroke. Patient can remain on 5[facility] for monitoring, Q2 neuro checks per MD Lindzen, if patient has changes in mental status or hemodynamic decline please call rapid response at [phone].    Event Summary:   MD Notified: MD Marland Mcalpine notified by bedside RN  Call Time: [phone] for Rapid Response Arrival Time: 1635 End Time:1800  Henrene Hawking, RN

## 2021-04-07 NOTE — Progress Notes (Signed)
Pt refusing CPAP for the night.  

## 2021-04-08 ENCOUNTER — Inpatient Hospital Stay (HOSPITAL_COMMUNITY): Payer: Medicare HMO

## 2021-04-08 LAB — COMPREHENSIVE METABOLIC PANEL
ALT: 20 U/L (ref 0–44)
AST: 22 U/L (ref 15–41)
Albumin: 2.3 g/dL — ABNORMAL LOW (ref 3.5–5.0)
Alkaline Phosphatase: 90 U/L (ref 38–126)
Anion gap: 8 (ref 5–15)
BUN: 66 mg/dL — ABNORMAL HIGH (ref 8–23)
CO2: 36 mmol/L — ABNORMAL HIGH (ref 22–32)
Calcium: 8.4 mg/dL — ABNORMAL LOW (ref 8.9–10.3)
Chloride: 94 mmol/L — ABNORMAL LOW (ref 98–111)
Creatinine, Ser: 2.58 mg/dL — ABNORMAL HIGH (ref 0.44–1.00)
GFR, Estimated: 19 mL/min — ABNORMAL LOW (ref >60)
Glucose, Bld: 115 mg/dL — ABNORMAL HIGH (ref 70–99)
Potassium: 5.7 mmol/L — ABNORMAL HIGH (ref 3.5–5.1)
Sodium: 138 mmol/L (ref 135–145)
Total Bilirubin: 0.5 mg/dL (ref 0.3–1.2)
Total Protein: 6.4 g/dL — ABNORMAL LOW (ref 6.5–8.1)

## 2021-04-08 LAB — OSMOLALITY, URINE: Osmolality, Ur: 451 mOsm/kg (ref 300–900)

## 2021-04-08 LAB — MAGNESIUM: Magnesium: 2.3 mg/dL (ref 1.7–2.4)

## 2021-04-08 LAB — GLUCOSE, CAPILLARY
Glucose-Capillary: 135 mg/dL — ABNORMAL HIGH (ref 70–99)
Glucose-Capillary: 150 mg/dL — ABNORMAL HIGH (ref 70–99)
Glucose-Capillary: 128 mg/dL — ABNORMAL HIGH (ref 70–99)

## 2021-04-08 LAB — CBC WITH DIFFERENTIAL/PLATELET
Abs Immature Granulocytes: 0.07 10*3/uL (ref 0.00–0.07)
Basophils Absolute: 0.1 10*3/uL (ref 0.0–0.1)
Basophils Relative: 1 %
Eosinophils Absolute: 0.1 10*3/uL (ref 0.0–0.5)
Eosinophils Relative: 1 %
HCT: 39.2 % (ref 36.0–46.0)
Hemoglobin: 11.3 g/dL — ABNORMAL LOW (ref 12.0–15.0)
Immature Granulocytes: 1 %
Lymphocytes Relative: 16 %
Lymphs Abs: 1.6 10*3/uL (ref 0.7–4.0)
MCH: 30.2 pg (ref 26.0–34.0)
MCHC: 28.8 g/dL — ABNORMAL LOW (ref 30.0–36.0)
MCV: 104.8 fL — ABNORMAL HIGH (ref 80.0–100.0)
Monocytes Absolute: 0.9 10*3/uL (ref 0.1–1.0)
Monocytes Relative: 9 %
Neutro Abs: 7.3 10*3/uL (ref 1.7–7.7)
Neutrophils Relative %: 72 %
Platelets: 285 10*3/uL (ref 150–400)
RBC: 3.74 MIL/uL — ABNORMAL LOW (ref 3.87–5.11)
RDW: 16 % — ABNORMAL HIGH (ref 11.5–15.5)
WBC: 10 10*3/uL (ref 4.0–10.5)
nRBC: 0.4 % — ABNORMAL HIGH (ref 0.0–0.2)

## 2021-04-08 LAB — TSH: TSH: 7.285 u[IU]/mL — ABNORMAL HIGH (ref 0.350–4.500)

## 2021-04-08 LAB — RPR: RPR Ser Ql: NONREACTIVE

## 2021-04-08 LAB — PHOSPHORUS: Phosphorus: 5.9 mg/dL — ABNORMAL HIGH (ref 2.5–4.6)

## 2021-04-08 LAB — POTASSIUM
Potassium: 6 mmol/L — ABNORMAL HIGH (ref 3.5–5.1)
Potassium: 5.8 mmol/L — ABNORMAL HIGH (ref 3.5–5.1)
Potassium: 5.3 mmol/L — ABNORMAL HIGH (ref 3.5–5.1)
Potassium: 5.6 mmol/L — ABNORMAL HIGH (ref 3.5–5.1)
Potassium: 5.5 mmol/L — ABNORMAL HIGH (ref 3.5–5.1)

## 2021-04-08 LAB — FOLATE: Folate: 22.7 ng/mL (ref >5.9)

## 2021-04-08 LAB — MRSA NEXT GEN BY PCR, NASAL: MRSA by PCR Next Gen: NOT DETECTED

## 2021-04-08 LAB — CORTISOL-AM, BLOOD: Cortisol - AM: 16.8 ug/dL (ref 6.7–22.6)

## 2021-04-08 IMAGING — DX DG CHEST 1V PORT
1 series · 1 of 1 positions shown · non-contrast
Comparison: Chest x-ray [DATE].

CLINICAL DATA: 75-year-old female with history of shortness of
breath.

EXAM:
PORTABLE CHEST 1 VIEW

[chest ap]
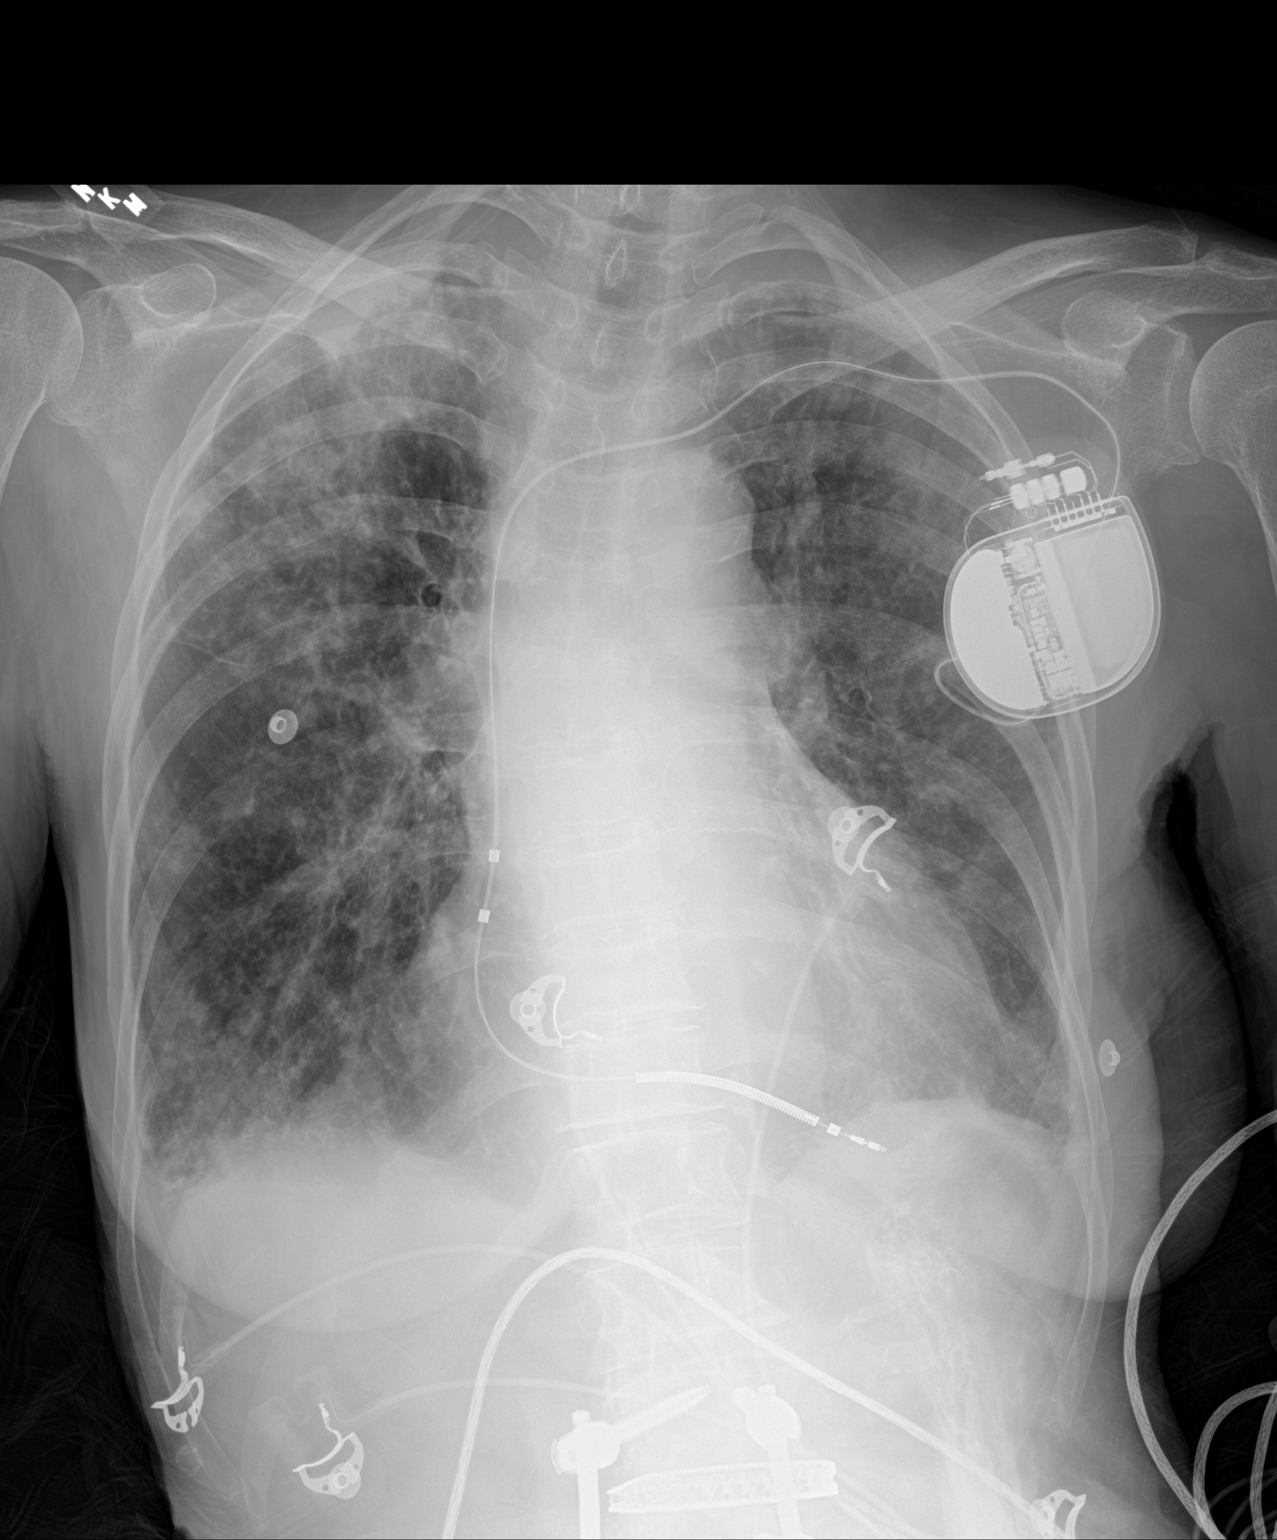

[1 of 1 positions shown; findings below may reference images not displayed]

FINDINGS: Widespread areas of interstitial prominence and patchy airspace
disease are noted throughout the lungs bilaterally, worsened
compared to the prior study, particularly in the right upper lobe
near the apex. Bilateral apical pleuroparenchymal thickening,
similar to the prior study, but worsened when compared to more
remote prior examination from [DATE], particularly on the right,
suggesting the possibility of some loculated right apical pleural
fluid. Small bilateral pleural effusions also noted in the bases.
Pulmonary vasculature is largely obscured. Heart size is mildly
enlarged. Upper mediastinal contours are within normal limits.
Atherosclerotic calcifications are noted in the thoracic aorta.
Left-sided pacemaker/AICD with lead tip projecting over the expected
location of the right ventricular apex. Orthopedic fixation hardware
in the lumbar spine incompletely imaged.
IMPRESSION: 1. The appearance the chest suggests worsening multilobar bilateral
bronchopneumonia, most severe in the right upper lobe. Small
bilateral pleural effusions are now evident, including potentially
loculated pleural fluid in the apex of the right hemithorax.
2. Mild cardiomegaly.
3. Aortic atherosclerosis.

## 2021-04-08 MED ORDER — HEPARIN SODIUM (PORCINE) 5000 UNIT/ML IJ SOLN
5000.0000 [IU] | Freq: Three times a day (TID) | INTRAMUSCULAR | Status: DC
  Administered 2021-04-09 – 2021-04-14 (×13): 5000 [IU] via SUBCUTANEOUS
  Filled 2021-04-08 (×15): qty 1

## 2021-04-08 MED ORDER — SODIUM ZIRCONIUM CYCLOSILICATE 10 G PO PACK
10.0000 g | PACK | Freq: Once | ORAL | Status: AC
  Administered 2021-04-08: 20:00:00 10 g via ORAL
  Filled 2021-04-08: qty 1

## 2021-04-08 MED ORDER — POLYETHYLENE GLYCOL 3350 17 G PO PACK
17.0000 g | PACK | Freq: Every day | ORAL | Status: DC
  Administered 2021-04-08 – 2021-04-13 (×4): 17 g via ORAL
  Filled 2021-04-08 (×5): qty 1

## 2021-04-08 NOTE — Progress Notes (Signed)
Progress Note   Subjective  Patient is about the same. No recurrence of neurologic symptoms. Remeron has been stopped due to Neurologic symptoms. Pending transfer to [facility] for Neurology workup. Palliative care has been consulted. Spoke with the patient's daughter for a bit about her course and future goals. Cortisol level normal.     Objective   Vital signs in last 24 hours: Temp:  [97.3 F (36.3 C)-98.3 F (36.8 C)] 98.2 F (36.8 C) (12/18 1327) Pulse Rate:  [80-90] 90 (12/18 1327) Resp:  [19] 19 (12/17 1649) BP: (107-135)/(79-94) 135/84 (12/18 1327) SpO2:  [92 %-100 %] 98 % (12/18 1327)   General:    white female in NAD Neurologic:  Alert and oriented,  grossly normal neurologically. Psych:  Cooperative. Normal mood and affect.  Intake/Output from previous day: 12/17 0701 - 12/18 0700 In: 600 [P.O.:600] Out: 250 [Urine:250] Intake/Output this shift: Total I/O In: 120 [P.O.:120] Out: -   Lab Results: Recent Labs    04/07/21 0621 04/07/21 1704 04/08/21 0527  WBC 10.3 10.5 10.0  HGB 12.7 12.2 11.3*  HCT 43.4 41.9 39.2  PLT 329 342 285   BMET Recent Labs    04/07/21 0621 04/07/21 1346 04/07/21 1704 04/07/21 2155 04/08/21 0527 04/08/21 0840 04/08/21 1320  NA 138  --  135  --  138  --   --   K 5.6*   5.6*   < > 5.8*   5.9*   < > 5.7* 5.3* 5.6*  CL 94*  --  93*  --  94*  --   --   CO2 36*  --  33*  --  36*  --   --   GLUCOSE 100*  --  141*  --  115*  --   --   BUN 62*  --  62*  --  66*  --   --   CREATININE 2.13*  --  2.55*  --  2.58*  --   --   CALCIUM 8.5*  --  8.4*  --  8.4*  --   --    < > = values in this interval not displayed.   LFT Recent Labs    04/08/21 0527  PROT 6.4*  ALBUMIN 2.3*  AST 22  ALT 20  ALKPHOS 90  BILITOT 0.5   PT/INR No results for input(s): LABPROT, INR in the last 72 hours.  Studies/Results: CT ABDOMEN PELVIS WO CONTRAST  Result Date: 04/07/2021 CLINICAL DATA:  Acute abdominal pain EXAM: CT ABDOMEN AND PELVIS  WITHOUT CONTRAST TECHNIQUE: Multidetector CT imaging of the abdomen and pelvis was performed following the standard protocol without IV contrast. COMPARISON:  12/24/2020 FINDINGS: Lower chest: Lung bases again demonstrate evidence of bronchiectasis with multiple areas of mucous plugging particularly in the right lower lobe. The mucous plugging has progressed in the interval from the prior exam. Left basilar atelectasis is noted new from the prior exam. Hepatobiliary: No focal liver abnormality is seen. No gallstones, gallbladder wall thickening, or biliary dilatation. Pancreas: Unremarkable. No pancreatic ductal dilatation or surrounding inflammatory changes. Spleen: No acute abnormality noted. 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 Adrenals/Urinary Tract: Adrenal glands are within normal limits. Considerable scatter artifact is noted from the lumbar hardware limiting evaluation of the kidneys. A few scattered nonobstructing renal stones are noted within the left kidney. The largest of these measures 6 mm in greatest dimension. The previously seen lesions within  the left kidney are not well appreciated on this exam due to the lack of IV contrast. No obstructive changes are noted. Bladder is well distended. Stomach/Bowel: Fecal material is noted throughout the colon. No obstructive changes are noted. The appendix is not well visualized. No inflammatory changes to suggest appendicitis are noted. The stomach and small bowel appear within normal limits. Vascular/Lymphatic: Aortic atherosclerosis. No enlarged abdominal or pelvic lymph nodes. Reproductive: Status post hysterectomy. No adnexal masses. Other: No abdominal wall hernia or abnormality. No abdominopelvic ascites. Musculoskeletal: Degenerative changes of lumbar spine are noted. Postsurgical changes are seen from L2-L4 with interbody fusion at both levels. Sacral perineural cyst is again identified and  stable. IMPRESSION: Stable left renal calculi without obstructive change. The known lesions within the left kidney are not well evaluated on this exam due to lack of IV contrast. These have been evaluated on prior ultrasound and shown to represent simple and complex renal cysts. Progressive mucous plugging particularly in the right lower lobe in areas of bronchiectasis when compared with the prior exam. New left basilar atelectasis is noted when compare with the prior study. No other focal abnormality is noted. Electronically Signed   By: Alcide Clever M.D.   On: 04/07/2021 19:15   US RENAL  Result Date: 04/08/2021 CLINICAL DATA:  Acute kidney injury. EXAM: RENAL / URINARY TRACT ULTRASOUND COMPLETE COMPARISON:  CT new, 04/07/2021.  Renal ultrasound, 12/24/2020. FINDINGS: Right Kidney: Renal measurements: 7.5 x 3.2 x 5.6 cm = volume: 57 mL. Increased parenchymal echogenicity and diffuse cortical thinning. 2 simple appearing cysts, upper and midpole, 1.2 x 1.3 x 1.4 cm and 1.6 x 1.1 x 1.4 cm respectively. No other masses, no stones and no hydronephrosis. Left Kidney: Renal measurements: 9.1 x 4.6 x 5.0 cm = volume: 109.1 mL. Increased parenchymal echogenicity. Small cysts, midpole cyst measuring 1.2 x 1.0 x 1.5 cm and lower pole cyst measuring 1.0 x 0.7 x 1.0 cm. Echogenic foci consistent nonobstructing calculi, largest 8 mm, lower pole. No hydronephrosis. Bladder: Appears normal for degree of bladder distention. Other: None. IMPRESSION: 1. Findings consistent with medical renal disease increased renal parenchymal echogenicity bilaterally. 2. No acute findings.  No hydronephrosis. 3. Bilateral renal cysts and left intrarenal stone. 4. Right kidney measures smaller than on the prior ultrasound, current volume 57 mL, previous volume 112 mL. Electronically Signed   By: Amie Portland M.D.   On: 04/08/2021 09:48   DG CHEST PORT 1 VIEW  Result Date: 04/08/2021 CLINICAL DATA:  75 year old female with history of  shortness of breath. EXAM: PORTABLE CHEST 1 VIEW COMPARISON:  Chest x-ray 04/19/2021. FINDINGS: Widespread areas of interstitial prominence and patchy airspace disease are noted throughout the lungs bilaterally, worsened compared to the prior study, particularly in the right upper lobe near the apex. Bilateral apical pleuroparenchymal thickening, similar to the prior study, but worsened when compared to more remote prior examination from 12/24/2020, particularly on the right, suggesting the possibility of some loculated right apical pleural fluid. Small bilateral pleural effusions also noted in the bases. Pulmonary vasculature is largely obscured. Heart size is mildly enlarged. Upper mediastinal contours are within normal limits. Atherosclerotic calcifications are noted in the thoracic aorta. Left-sided pacemaker/AICD with lead tip projecting over the expected location of the right ventricular apex. Orthopedic fixation hardware in the lumbar spine incompletely imaged. IMPRESSION: 1. The appearance the chest suggests worsening multilobar bilateral bronchopneumonia, most severe in the right upper lobe. Small bilateral pleural effusions are now evident, including potentially loculated pleural  fluid in the apex of the right hemithorax. 2. Mild cardiomegaly. 3. Aortic atherosclerosis. Electronically Signed   By: Vinnie Langton M.D.   On: 04/08/2021 08:34   CT HEAD CODE STROKE WO CONTRAST`  Result Date: 04/07/2021 CLINICAL DATA:  Code stroke. Altered mental status, found unresponsive EXAM: CT HEAD WITHOUT CONTRAST TECHNIQUE: Contiguous axial images were obtained from the base of the skull through the vertex without intravenous contrast. COMPARISON:  Brain MRI 07/15/2019 FINDINGS: Brain: There is no evidence of acute intracranial hemorrhage, extra-axial fluid collection, or acute infarct. Parenchymal volume is normal. The ventricles are normal in size. There is mild hypodensity in the periventricular white matter  likely reflecting mild chronic white matter microangiopathy. There is no solid mass lesion.  There is no midline shift. Vascular: No hyperdense vessel or unexpected calcification. Skull: Normal. Negative for fracture or focal lesion. Sinuses/Orbits: The imaged paranasal sinuses are clear. Bilateral lens implants are in place. The globes and orbits are otherwise unremarkable. Other: None. ASPECTS [facility] Stroke Program Early CT Score) - Ganglionic level infarction (caudate, lentiform nuclei, internal capsule, insula, M1-M3 cortex): 7 - Supraganglionic infarction (M4-M6 cortex): 3 Total score (0-10 with 10 being normal): 10 IMPRESSION: 1. No acute intracranial pathology. 2. ASPECTS is 10 These results were paged via AMION at the time of interpretation on 04/07/2021 at 5:30 pm to provider [facility] . Electronically Signed   By: Valetta Mole M.D.   On: 04/07/2021 17:36       Assessment / Plan:    75 y.o. female with bronchiectasis / chronic respiratory failure on chronic oxygen, OSA, MAI infection, severe CHF, ICD in place, admitted for failure to thrive.  See prior notes for details of her case - she has ongoing poor appetite, intermittent epigastric pain, dysphagia - the latter rather mild recently but she is not eating well and BMI has dropped to 15.   Reviewed [place] and CT imaging, I don't think her gallbladder is causing her problems. We have discussed the role of EGD, she is higher risk for anesthesia given her underlying lung disease. Further, yesterday she had a neurologic event and now pending MRI brain, EEG, and transferring to [facility] hospital for Neurologic workup.   Have discussed her course with the patient's daughters. Palliative care has been consulted to discuss goals of care and the wish to proceed with that and have entertained hospice and want to learn more about that option. They do not want to proceed with EGD at this time given her recent neurologic event and her respiratory status which I  think is reasonable. We discussed other things to consider for her underlying anxiety which they think may be playing a role in her symptoms, perhaps causing some dyspepsia. Remeron on hold given neurologic symptoms, may consider buspirone pending her course. Will await evaluation by palliative care and her neurologic workup. Holding off on endoscopy for now. If things change and they wish to pursue this please contact us, otherwise will sign off for now.   Jolly Mango, MD [facility]

## 2021-04-08 NOTE — Plan of Care (Signed)
°  Problem: Education: Goal: Knowledge of General Education information will improve Description: Including pain rating scale, medication(s)/side effects and non-pharmacologic comfort measures Outcome: Progressing   Problem: Health Behavior/Discharge Planning: Goal: Ability to manage health-related needs will improve Outcome: Not Progressing   Problem: Clinical Measurements: Goal: Ability to maintain clinical measurements within normal limits will improve Outcome: Progressing Goal: Will remain free from infection Outcome: Progressing Goal: Diagnostic test results will improve Outcome: Progressing Goal: Respiratory complications will improve Outcome: Progressing Goal: Cardiovascular complication will be avoided Outcome: Progressing   Problem: Activity: Goal: Risk for activity intolerance will decrease Outcome: Not Progressing   Problem: Nutrition: Goal: Adequate nutrition will be maintained Outcome: Not Progressing   Problem: Coping: Goal: Level of anxiety will decrease Outcome: Progressing   Problem: Pain Managment: Goal: General experience of comfort will improve Outcome: Progressing   Problem: Safety: Goal: Ability to remain free from injury will improve Outcome: Progressing   Problem: Skin Integrity: Goal: Risk for impaired skin integrity will decrease Outcome: Progressing   Problem: Education: Goal: Knowledge of disease or condition will improve Outcome: Progressing Goal: Knowledge of secondary prevention will improve (SELECT ALL) Outcome: Progressing Goal: Knowledge of patient specific risk factors will improve (INDIVIDUALIZE FOR PATIENT) Outcome: Progressing   Problem: Health Behavior/Discharge Planning: Goal: Ability to manage health-related needs will improve Outcome: Progressing   Problem: Self-Care: Goal: Ability to participate in self-care as condition permits will improve Outcome: Not Progressing Goal: Verbalization of feelings and concerns over  difficulty with self-care will improve Outcome: Progressing Goal: Ability to communicate needs accurately will improve Outcome: Progressing   Problem: Nutrition: Goal: Risk of aspiration will decrease Outcome: Progressing   Problem: Ischemic Stroke/TIA Tissue Perfusion: Goal: Complications of ischemic stroke/TIA will be minimized Outcome: Progressing

## 2021-04-08 NOTE — TOC CM/SW Note (Signed)
°  Transition of Care [facility]) Screening Note   Patient Details  Name: Stacy Sanchez Date of Birth: 10-13-1945   Transition of Care [facility]) CM/SW Contact:    Darleene Cleaver, LCSW Phone Number: 04/08/2021, 5:48 PM    Transition of Care Department [facility]) has reviewed patient and no TOC needs have been identified at this time. We will continue to monitor patient advancement through interdisciplinary progression rounds. If new patient transition needs arise, please place a TOC consult.

## 2021-04-08 NOTE — Progress Notes (Signed)
Pt is injury free, afebrile, alert, and oriented X 2. Vital signs were within the baseline during this shift. Pt's oxygen saturation stable on 3L [place]. Neuro assessment unchanged from prior shift. Pt refused to wear CPAP at night. She denies SOB, vomiting, dizziness, signs or symptoms of bleeding or acute changes during this shift. We will continue to monitor and work toward achieving the care plan goals

## 2021-04-08 NOTE — Procedures (Signed)
PROGRESS NOTE    Stacy Sanchez  VHQ:[id number] DOB: 10/13/1945 DOA: 04/11/2021 PCP: Burnett Sheng, MD   Brief Narrative:  The patient is a 75 year old chronically ill-appearing cachectic and thin Caucasian female with past medical history significant for but not limited to bronchiectasis, CAD, CKD stage III, hypertension, hypothyroidism, neuropathy, cytopenia, CHF, MAC as well as other comorbidities who presents with ongoing abdominal pain.  Patient's had epigastric abdominal pain for the last few days and this is had similar pain waxing waning for the last week or so.  Pain lasted about an hour and a half and is improved when she lays down rupture abdomen.  She had an episode of nausea without vomiting and has had a chronic cough as well.  She has had decreased p.o. intake and was planning to see GI for abdominal pain but this was rescheduled.  Was also noted to have extremely poor p.o. intake and a 30 pound weight loss in the last year or so.  Further work-up in the ED revealed that she had a hyperkalemia and elevated BUN/creatinine as well as mild leukocytosis.  BNP was elevated at 1600 however she did not appear volume overloaded.  Influenza panel and COVID were negative.  Urine blood cultures were obtained.  Chest x-ray was done and showed prominent interstitial markings and they were unable to exclude airspace disease.  Ultrasound abdomen showed mild wall thickening of the gallbladder without Murphy sign for possible cholecystitis.  She is initiated on doxycycline, low, given a liter of fluid in the ED.  She was then initiated on maintenance IV fluid hydration and given her abdominal pain GI was consulted.  She did have very minimally elevated troponin was flat but cardiology was consulted for further evaluation.  Palliative care is also been consulted.  During her hospitalization she ended up having a transient episode of slurred speech so she went for a stat head CT scan and a telemetry neurology  consult was initiated.  Neurology evaluated and recommending an MRI that is pacemaker compatible at [facility] as well as the EEG as well as frequent neurochecks.  Neurology recommends stopping the Remeron and having further work-up done at [facility].  GI consulted given her weight loss and they discussed role of EGD and currently her not want to pursue this at this time and awaiting palliative care goals of care discussion for possible hospice discussion.  GI recommended continue buspirone for possible anxiety related to dyspepsia but awaiting palliative care and neurological work-up and recommending continuing to hold off on endoscopy at this time.  Assessment & Plan:   Principal Problem:   Adult failure to thrive Active Problems:   CAD (coronary artery disease)   CKD (chronic kidney disease), stage III (HCC)   Essential hypertension   Hypothyroidism   Anxiety state   Major depressive disorder with single episode, in partial remission (HCC)   Memory loss of unknown cause   OSA on CPAP   Abnormal EKG   Hyperkalemia   Pain of upper abdomen   Unintentional weight loss  Adult failure to thrive Generalized weakness and deconditioning Dysphagia with anorexia > Admitted for Adult failure considering patient has lost 30 pounds last year and had minimal p.o. intake with recurrent abdominal pain. - Potassium is elevated will check other electrolytes including magnesium and phosphorus. -Estimated body mass index is 15.66 kg/m as calculated from the following:   Height as of this encounter: _0  (1.727 m).   Weight as of this encounter: 46.7  kg. - Nutrition consult has been placed and pending -Given her significant abdominal pain and decreased p.o. intake a CT scan of the abdomen pelvis has been done and GIs been consulted and they evaluated and discussed EGD and they do not want to pursue EGD at this time -We will start gentle IV fluid hydration as above -PT OT consulted for further  evaluation recommendation - Supportive care and palliative care has been consulted for further evaluation -On supplementation with Ensure Enlive 237 mL p.o. twice daily -Seen by GI by Dr. Silverio Decamp about a month ago and had a subsequent esophagram which showed moderate esophageal dysmotility    Abdominal pain -As above -CT scan of the abdomen pelvis ordered and showed "Stable left renal calculi without obstructive change. The known lesions within the left kidney are not well evaluated on this exam due to lack of IV contrast. These have been evaluated on prior ultrasound and shown to represent simple and complex renal cysts. Progressive mucous plugging particularly in the right lower lobe in areas of bronchiectasis when compared with the prior exam. New left basilar atelectasis is noted when compare with the prior study. No other focal abnormality is noted."   -GI consulted for further evaluation and they discussed EGD but will hold off at this time -Currently awaiting palliative care discussions  Hyperkalemia > Potassium elevated to 6 initially and then again on repeat.  We will continue with Lokelma and trend potassium level and stop once returning to normal. -Trend potassium and repeat potassium is 5.6 - Continue Lokelma and give another dose now -Repeat labs this evening   Leukocytosis ?  Pneumonia in the setting of known bronchiectasis and MAI > Noted to have mild leukocytosis 12.3 in the ED which improved to 10.3.  Also chest x-ray with inability to rule out airspace disease. > Doxycycline ordered in the ED will continue this for now. > Also ordered in the ED were urinalysis and blood cultures. - Continue with doxycycline for now -Started on Xopenex and Atrovent - Follow-up urinalysis and blood cultures - Trend fever curve and white count as she is afebrile   EKG abnormality with minimally elevated troponins > New T wave inversions.  We will check and trend troponin.  Also notably BNP  elevated. - Troponin flat at 86 and likely in the setting of demand ischemia -Cardiology was consulted given patient's family's request and they suspect demand ischemia given no CAD on cath 01/30/2020.  Patient denies any chest pain but has chronic dyspnea and is progressively worsening in the setting of chronic MAI infection and bronchiectasis -Cardiology feels that we need to continue with supportive care no indication for ischemic work-up is indicated at this time   Transient slurred speech and right-sided weakness with delayed responses Left-sided choreiform movements -Initially happened and when patient was pale but now speech is improved.  Patient continues to perseverate and when asked when the year she keeps saying as of December -Code stroke was activated after my discussion with the neurologist at [facility] without contrast has been ordered and telemetry neurology to see the patient and head CT was negative but neurology recommending MRI and EEG  -Neurology recommends transfer to [facility] for further evaluation -Further care per neurology and still awaiting palliative care discussion  Chronic systolic CHF > Known history of CHF last echo was last month with EF 45-50% and grade 1 diastolic dysfunction.;  She does have an echo at [facility] which showed an EF short  of 20 to 25% > BNP was elevated in the ED to greater than 1600 however patient does not appear volume overloaded and received some fluid which will resume -Her beta-blocker was held due to soft blood pressures -She is unable to tolerate losartan or lisinopril due to cough and low blood pressures -She is off of digoxin with recovery of LV function -Cardiology recommending avoiding spironolactone due to hyperkalemia in the past -We have initiated IV fluid hydration with his gentle at 75 MLS per hour > We will monitor for any evidence of worsening respirations and consider CHF exacerbation at that time.  We will also be  checking troponin as above concerning new EKG changes. - Holding home nadolol in the setting of low normal blood pressures - Not currently on daily diuretic -She is +1.474 L since admission -Cardiology recommends palliative care consultation and this has been ordered   Anxiety Depression - Continue home medications when confirmed - Reportedly on Remeron which will be resumed but now discontinued at the recommendation of neurology -Patient seems little bit anxious and may need to discuss with care about anxiety regimen given that the mirtazapine caused some choreiform movements  Hypothyroidism - Continue home Synthroid of 150 mcg and check TSH in the morning  Hypertension with history of orthostatic hypotension -Her recent midodrine was stopped given her systolic blood pressures been elevated previously -Nadolol has been held on admission due to her softer blood pressure -Continue to monitor blood pressures per protocol  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 by bronchiectasis with recent pseudomonal infection -Continue supplemental oxygen and normally wears 2 L during the day and 2 L at night -SpO2: 98 % O2 Flow Rate (L/min): 3 L/min FiO2 (%): 32 % -Palliative care consulted for further goals of care discussion   CAD Hyperlipidemia - Continue home rosuvastatin - Continue home aspirin -Cardiology following as above   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 > Creatinine stable at 1.37 on admission but is now worsening to 62/2.55 today and today it is 66/2.58 - Avoid nephrotoxic meds, contrast dyes, hypotension renally dose medications -Started gentle IV fluid hydration with normal saline at 75 mils per hour and will continue - Trend renal function electrolytes and will obtain a renal ultrasound, urine electrolytes as well as a urinalysis -We will consider Foley placement and will also likely discuss with nephrology in the morning if she continues to  worsen   OSA - Continue home CPAP; refused last night  DVT prophylaxis: SCDs and enoxaparin 30 mg subcu q. 24 Code Status: FULL CODE Family Communication: Discussed with daughter at bedside Disposition Plan: Pending further clinical improvement and clearance by specialist as well as palliative care discussion  Status is: Inpatient  Remains inpatient appropriate because: Failure to Thrive and Poor Po Intake   Consultants:  Cardiology Gastroenterology  Neurology  Palliative Care  Procedures:  Head CT   Abdominal CT  U/S   Antimicrobials:  Anti-infectives (From admission, onward)    Start     Dose/Rate Route Frequency Ordered Stop   04/07/21 0600  doxycycline (VIBRA-TABS) tablet 100 mg        100 mg Oral Every 12 hours 04/14/2021 2245     03/26/2021 1700  doxycycline (VIBRA-TABS) tablet 100 mg        100 mg Oral  Once 04/01/2021 1658 03/27/2021 1745        Subjective: Seen and examined at bedside and was very fatigued and feeling weak.  A little  dyspneic.  A little confused as well.  Still having some choreiform movements.  Neurology recommending transfer to [facility] for further work-up.  Still awaiting palliative care discussion.  Objective: Vitals:   04/08/21 0256 04/08/21 0530 04/08/21 0841 04/08/21 1327  BP:  118/79  135/84  Pulse:  88  90  Resp:      Temp:  98.3 F (36.8 C)  98.2 F (36.8 C)  TempSrc:  Oral  Oral  SpO2: 94% 96% 92% 98%  Weight:      Height:        Intake/Output Summary (Last 24 hours) at 04/08/2021 1718 Last data filed at 04/08/2021 0900 Gross per 24 hour  Intake 240 ml  Output 250 ml  Net -10 ml    Filed Weights   04/01/2021 1307  Weight: 46.7 kg   Examination: Physical Exam:  Constitutional: Thin frail cachectic elderly female who is very fatigued appearing Eyes:  Lids and conjunctivae normal, sclerae anicteric  ENMT: External Ears, Nose appear normal. Grossly normal hearing. Neck: Appears normal, supple, no cervical masses,  normal ROM, no appreciable thyromegaly; no appreciable JVD Respiratory: Diminished to auscultation bilaterally with coarse breath sounds, no wheezing, rales, rhonchi or crackles. Normal respiratory effort and patient is not tachypenic. No accessory muscle use.  Wearing supplemental oxygen via nasal cannula Cardiovascular: RRR, no murmurs / rubs / gallops. S1 and S2 auscultated. No extremity edema. 2+ pedal pulses. No carotid bruits.  Abdomen: Soft, non-tender, non-distended. Bowel sounds positive.  GU: Deferred. Musculoskeletal: No clubbing / cyanosis of digits/nails. No joint deformity upper and lower extremities but does have some atrophy and is very cachectic.  Skin: No rashes, lesions, ulcers on limited skin evaluation but does have. No induration; Warm and dry.  Neurologic: A little confused but cranial nerves II through XII grossly intact.  Having some Choreiform movements Psychiatric: Normal judgment and insight. Alert and oriented x 2. Normal mood and appropriate affect.   Data Reviewed: I have personally reviewed following labs and imaging studies  CBC: Recent Labs  Lab 04/14/2021 1259 04/07/21 0621 04/07/21 1704 04/08/21 0527  WBC 12.3* 10.3 10.5 10.0  NEUTROABS 9.7*  --  7.5 7.3  HGB 13.9 12.7 12.2 11.3*  HCT 46.9* 43.4 41.9 39.2  MCV 101.1* 104.6* 102.7* 104.8*  PLT 319 329 342 409    Basic Metabolic Panel: Recent Labs  Lab 04/09/2021 1259 04/16/2021 1545 04/17/2021 2222 04/07/21 0133 04/07/21 0621 04/07/21 1346 04/07/21 1704 04/07/21 2155 04/08/21 0155 04/08/21 0527 04/08/21 0840 04/08/21 1320  NA 138 141  --   --  138  --  135  --   --  138  --   --   K 6.0* 6.0* 5.6*   < > 5.6*   5.6*   < > 5.8*   5.9* 6.0* 5.8* 5.7* 5.3* 5.6*  CL 95* 95*  --   --  94*  --  93*  --   --  94*  --   --   CO2 36* 40*  --   --  36*  --  33*  --   --  36*  --   --   GLUCOSE 131* 104*  --   --  100*  --  141*  --   --  115*  --   --   BUN 45* 47*  --   --  62*  --  62*  --   --  66*   --   --  CREATININE 1.37* 1.47*  --   --  2.13*  --  2.55*  --   --  2.58*  --   --   CALCIUM 9.4 9.0  --   --  8.5*  --  8.4*  --   --  8.4*  --   --   MG  --   --  2.0  --   --   --  2.3  --   --  2.3  --   --   PHOS  --   --  7.5*  --   --   --  7.2*  --   --  5.9*  --   --    < > = values in this interval not displayed.    GFR: Estimated Creatinine Clearance: 13.5 mL/min (A) (by C-G formula based on SCr of 2.58 mg/dL (H)). Liver Function Tests: Recent Labs  Lab 04/21/2021 1259 04/07/21 0621 04/07/21 1704 04/08/21 0527  AST 60* 33 27 22  ALT _0 ALKPHOS 144* 110 103 90  BILITOT 0.8 0.6 0.6 0.5  PROT 7.7 6.6 6.6 6.4*  ALBUMIN 3.2* 2.4* 2.4* 2.3*    Recent Labs  Lab 03/31/2021 1545  LIPASE <10*    Recent Labs  Lab 04/07/21 1704  AMMONIA 43*    Coagulation Profile: No results for input(s): INR, PROTIME in the last 168 hours. Cardiac Enzymes: No results for input(s): CKTOTAL, CKMB, CKMBINDEX, TROPONINI in the last 168 hours. BNP (last 3 results) No results for input(s): PROBNP in the last 8760 hours. HbA1C: No results for input(s): HGBA1C in the last 72 hours. CBG: Recent Labs  Lab 04/07/21 1635 04/08/21 1153 04/08/21 1603  GLUCAP 145* 135* 150*    Lipid Profile: No results for input(s): CHOL, HDL, LDLCALC, TRIG, CHOLHDL, LDLDIRECT in the last 72 hours. Thyroid Function Tests: Recent Labs    04/08/21 0527  TSH 7.285*    Anemia Panel: Recent Labs    04/07/21 1704 04/08/21 0527  VITAMINB12 561  --   FOLATE  --  22.7   Sepsis Labs: Recent Labs  Lab 04/21/2021 1735 04/02/2021 2222  LATICACIDVEN 1.6 1.5     Recent Results (from the past 240 hour(s))  Resp Panel by RT-PCR (Flu A&B, Covid) Nasopharyngeal Swab     Status: None   Collection Time: 04/07/2021 12:59 PM   Specimen: Nasopharyngeal Swab; Nasopharyngeal(NP) swabs in vial transport medium  Result Value Ref Range Status   SARS Coronavirus 2 by RT PCR NEGATIVE NEGATIVE Final     Comment: (NOTE) SARS-CoV-2 target nucleic acids are NOT DETECTED.  The SARS-CoV-2 RNA is generally detectable in upper respiratory specimens during the acute phase of infection. The lowest concentration of SARS-CoV-2 viral copies this assay can detect is 138 copies/mL. A negative result does not preclude SARS-Cov-2 infection and should not be used as the sole basis for treatment or other patient management decisions. A negative result may occur with  improper specimen collection/handling, submission of specimen other than nasopharyngeal swab, presence of viral mutation(s) within the areas targeted by this assay, and inadequate number of viral copies(<138 copies/mL). A negative result must be combined with clinical observations, patient history, and epidemiological information. The expected result is Negative.  Fact Sheet for Patients:  EntrepreneurPulse.com.au  Fact Sheet for Healthcare Providers:  IncredibleEmployment.be  This test is no t yet approved or cleared by the [place] FDA and  has been authorized for detection and/or diagnosis of SARS-CoV-2 by FDA under  an Emergency Use Authorization (EUA). This EUA will remain  in effect (meaning this test can be used) for the duration of the COVID-19 declaration under Section 564(b)(1) of the Act, 21 U.S.C.section 360bbb-3(b)(1), unless the authorization is terminated  or revoked sooner.       Influenza A by PCR NEGATIVE NEGATIVE Final   Influenza B by PCR NEGATIVE NEGATIVE Final    Comment: (NOTE) The Xpert Xpress SARS-CoV-2/FLU/RSV plus assay is intended as an aid in the diagnosis of influenza from Nasopharyngeal swab specimens and should not be used as a sole basis for treatment. Nasal washings and aspirates are unacceptable for Xpert Xpress SARS-CoV-2/FLU/RSV testing.  Fact Sheet for Patients: EntrepreneurPulse.com.au  Fact Sheet for Healthcare  Providers: IncredibleEmployment.be  This test is not yet approved or cleared by the [place] FDA and has been authorized for detection and/or diagnosis of SARS-CoV-2 by FDA under an Emergency Use Authorization (EUA). This EUA will remain in effect (meaning this test can be used) for the duration of the COVID-19 declaration under Section 564(b)(1) of the Act, 21 U.S.C. section 360bbb-3(b)(1), unless the authorization is terminated or revoked.  Performed at [facility], [address], [place], [place] [address]   Culture, blood (routine x 2)     Status: None (Preliminary result)   Collection Time: 04/10/2021  5:30 PM   Specimen: BLOOD  Result Value Ref Range Status   Specimen Description   Final    BLOOD RIGHT ANTECUBITAL Performed at [facility], [address], [place], [place] [address]    Special Requests   Final    Blood Culture adequate volume BOTTLES DRAWN AEROBIC AND ANAEROBIC Performed at [facility], [address], [place], [place] [address]    Culture   Final    NO GROWTH 2 DAYS Performed at [facility], [place] [address], [place], [place] [address]    Report Status PENDING  Incomplete  Culture, blood (routine x 2)     Status: None (Preliminary result)   Collection Time: 04/17/2021 10:22 PM   Specimen: BLOOD  Result Value Ref Range Status   Specimen Description   Final    BLOOD RIGHT ANTECUBITAL Performed at [facility] [address], [place], [place] [address]    Special Requests   Final    BOTTLES DRAWN AEROBIC ONLY Blood Culture adequate volume Performed at [facility] [address] [place], [place] [address]    Culture   Final    NO GROWTH 1 DAY Performed at [facility], [place] [address], [place], [place] [address]    Report Status PENDING  Incomplete  MRSA Next Gen by PCR, Nasal     Status: None   Collection Time:  04/08/21 11:30 AM   Specimen: Nasal Mucosa; Nasal Swab  Result Value Ref Range Status   MRSA by PCR Next Gen NOT DETECTED NOT DETECTED Final    Comment: (NOTE) The GeneXpert MRSA Assay (FDA approved for NASAL specimens only), is one component of a comprehensive MRSA colonization surveillance program. It is not intended to diagnose MRSA infection nor to guide or monitor treatment for MRSA infections. Test performance is not FDA approved in patients less than 44 years old. Performed at [facility], [place] [address], [place], [place] [address]      RN Pressure Injury Documentation:     Estimated body mass index is 15.66 kg/m as calculated from the following:   Height as of  this encounter: _0  (1.727 m).   Weight as of this encounter: 46.7 kg.  Malnutrition Type: Nutrition Problem: Inadequate oral intake Etiology: poor appetite, lethargy/confusion Malnutrition Characteristics: Signs/Symptoms: per patient/family report Nutrition Interventions: Interventions: Ensure Enlive (each supplement provides 350kcal and 20 grams of protein), MVI  Radiology Studies: CT ABDOMEN PELVIS WO CONTRAST  Result Date: 04/07/2021 CLINICAL DATA:  Acute abdominal pain EXAM: CT ABDOMEN AND PELVIS WITHOUT CONTRAST TECHNIQUE: Multidetector CT imaging of the abdomen and pelvis was performed following the standard protocol without IV contrast. COMPARISON:  12/24/2020 FINDINGS: Lower chest: Lung bases again demonstrate evidence of bronchiectasis with multiple areas of mucous plugging particularly in the right lower lobe. The mucous plugging has progressed in the interval from the prior exam. Left basilar atelectasis is noted new from the prior exam. Hepatobiliary: No focal liver abnormality is seen. No gallstones, gallbladder wall thickening, or biliary dilatation. Pancreas: Unremarkable. No pancreatic ductal dilatation or surrounding inflammatory changes. Spleen: No acute abnormality noted.  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 Adrenals/Urinary Tract: Adrenal glands are within normal limits. Considerable scatter artifact is noted from the lumbar hardware limiting evaluation of the kidneys. A few scattered nonobstructing renal stones are noted within the left kidney. The largest of these measures 6 mm in greatest dimension. The previously seen lesions within the left kidney are not well appreciated on this exam due to the lack of IV contrast. No obstructive changes are noted. Bladder is well distended. Stomach/Bowel: Fecal material is noted throughout the colon. No obstructive changes are noted. The appendix is not well visualized. No inflammatory changes to suggest appendicitis are noted. The stomach and small bowel appear within normal limits. Vascular/Lymphatic: Aortic atherosclerosis. No enlarged abdominal or pelvic lymph nodes. Reproductive: Status post hysterectomy. No adnexal masses. Other: No abdominal wall hernia or abnormality. No abdominopelvic ascites. Musculoskeletal: Degenerative changes of lumbar spine are noted. Postsurgical changes are seen from L2-L4 with interbody fusion at both levels. Sacral perineural cyst is again identified and stable. IMPRESSION: Stable left renal calculi without obstructive change. The known lesions within the left kidney are not well evaluated on this exam due to lack of IV contrast. These have been evaluated on prior ultrasound and shown to represent simple and complex renal cysts. Progressive mucous plugging particularly in the right lower lobe in areas of bronchiectasis when compared with the prior exam. New left basilar atelectasis is noted when compare with the prior study. No other focal abnormality is noted. Electronically Signed   By: Inez Catalina M.D.   On: 04/07/2021 19:15   US RENAL  Result Date: 04/08/2021 CLINICAL DATA:  Acute kidney injury. EXAM: RENAL / URINARY TRACT ULTRASOUND  COMPLETE COMPARISON:  CT new, 04/07/2021.  Renal ultrasound, 12/24/2020. FINDINGS: Right Kidney: Renal measurements: 7.5 x 3.2 x 5.6 cm = volume: 57 mL. Increased parenchymal echogenicity and diffuse cortical thinning. 2 simple appearing cysts, upper and midpole, 1.2 x 1.3 x 1.4 cm and 1.6 x 1.1 x 1.4 cm respectively. No other masses, no stones and no hydronephrosis. Left Kidney: Renal measurements: 9.1 x 4.6 x 5.0 cm = volume: 109.1 mL. Increased parenchymal echogenicity. Small cysts, midpole cyst measuring 1.2 x 1.0 x 1.5 cm and lower pole cyst measuring 1.0 x 0.7 x 1.0 cm. Echogenic foci consistent nonobstructing calculi, largest 8 mm, lower pole. No hydronephrosis. Bladder: Appears normal for degree of bladder distention. Other: None. IMPRESSION: 1. Findings consistent with medical renal disease increased renal  parenchymal echogenicity bilaterally. 2. No acute findings.  No hydronephrosis. 3. Bilateral renal cysts and left intrarenal stone. 4. Right kidney measures smaller than on the prior ultrasound, current volume 57 mL, previous volume 112 mL. Electronically Signed   By: Lajean Manes M.D.   On: 04/08/2021 09:48   DG CHEST PORT 1 VIEW  Result Date: 04/08/2021 CLINICAL DATA:  75 year old female with history of shortness of breath. EXAM: PORTABLE CHEST 1 VIEW COMPARISON:  Chest x-ray 03/27/2021. FINDINGS: Widespread areas of interstitial prominence and patchy airspace disease are noted throughout the lungs bilaterally, worsened compared to the prior study, particularly in the right upper lobe near the apex. Bilateral apical pleuroparenchymal thickening, similar to the prior study, but worsened when compared to more remote prior examination from 12/24/2020, particularly on the right, suggesting the possibility of some loculated right apical pleural fluid. Small bilateral pleural effusions also noted in the bases. Pulmonary vasculature is largely obscured. Heart size is mildly enlarged. Upper mediastinal  contours are within normal limits. Atherosclerotic calcifications are noted in the thoracic aorta. Left-sided pacemaker/AICD with lead tip projecting over the expected location of the right ventricular apex. Orthopedic fixation hardware in the lumbar spine incompletely imaged. IMPRESSION: 1. The appearance the chest suggests worsening multilobar bilateral bronchopneumonia, most severe in the right upper lobe. Small bilateral pleural effusions are now evident, including potentially loculated pleural fluid in the apex of the right hemithorax. 2. Mild cardiomegaly. 3. Aortic atherosclerosis. Electronically Signed   By: Vinnie Langton M.D.   On: 04/08/2021 08:34   CT HEAD CODE STROKE WO CONTRAST`  Result Date: 04/07/2021 CLINICAL DATA:  Code stroke. Altered mental status, found unresponsive EXAM: CT HEAD WITHOUT CONTRAST TECHNIQUE: Contiguous axial images were obtained from the base of the skull through the vertex without intravenous contrast. COMPARISON:  Brain MRI 07/15/2019 FINDINGS: Brain: There is no evidence of acute intracranial hemorrhage, extra-axial fluid collection, or acute infarct. Parenchymal volume is normal. The ventricles are normal in size. There is mild hypodensity in the periventricular white matter likely reflecting mild chronic white matter microangiopathy. There is no solid mass lesion.  There is no midline shift. Vascular: No hyperdense vessel or unexpected calcification. Skull: Normal. Negative for fracture or focal lesion. Sinuses/Orbits: The imaged paranasal sinuses are clear. Bilateral lens implants are in place. The globes and orbits are otherwise unremarkable. Other: None. ASPECTS [facility] Stroke Program Early CT Score) - Ganglionic level infarction (caudate, lentiform nuclei, internal capsule, insula, M1-M3 cortex): 7 - Supraganglionic infarction (M4-M6 cortex): 3 Total score (0-10 with 10 being normal): 10 IMPRESSION: 1. No acute intracranial pathology. 2. ASPECTS is 10 These  results were paged via AMION at the time of interpretation on 04/07/2021 at 5:30 pm to provider [facility] . Electronically Signed   By: Valetta Mole M.D.   On: 04/07/2021 17:36    Scheduled Meds:  aspirin EC  81 mg Oral Daily   doxycycline  100 mg Oral Q12H   feeding supplement  237 mL Oral BID BM   [START ON 04/09/2021] heparin injection (subcutaneous)  5,000 Units Subcutaneous Q8H   ipratropium  0.5 mg Nebulization Q6H   levalbuterol  0.63 mg Nebulization Q6H   [START ON 04/09/2021] levothyroxine  50 mcg Oral Once per day on Mon Tue Wed Thu Fri   And   levothyroxine  100 mcg Oral Once per day on Sun Sat   multivitamin with minerals  1 tablet Oral Daily   pantoprazole  20 mg Oral Daily   polyethylene  glycol  17 g Oral Daily   rosuvastatin  10 mg Oral Daily   sodium chloride flush  3 mL Intravenous Q12H   Continuous Infusions:  sodium chloride 75 mL/hr at 04/08/21 0337    LOS: 2 days   Kerney Elbe, DO [facility] PAGER is on AMION  If 7PM-7AM, please contact night-coverage [email or web address]

## 2021-04-08 NOTE — Consult Note (Addendum)
NEUROLOGY CONSULTATION NOTE   Date of service: April 08, 2021 Patient Name: Stacy Sanchez MRN:  [id number] DOB:  March 03, 1946 Reason for consult: "choreiform movements on the left" Requesting Provider: Kerney Elbe, DO _ _ _   _ __   _ __ _ _  __ __   _ __   __ _  History of Present Illness  Stacy Sanchez is a 75 y.o. female with PMH significant for chronic hypoxic respiratory failure on Oxygen, MAI infection, OSA, severe CHF s/p ICD placement, who is admitted for epigastric pain and poor po food and fluids intake and failure to thrive with significant weight loss.  Workup with Potassium of 6 along with AKI with uptrending creatinine likely from dehydration 2/2 poor po intake. Elevated troponin, felt to be demand ischemia by  cardiology.  She had acute onset of delayed responses along with R sided weakness with slurred speech and a code stroke was activated. CT Head with no acute intracranial abnormalities. She was evaluated by teleneurology and noted to have left sided choreiform movements.  Most likely etiology for her acute worsening felt to be toxic/metabolic. Overall presentation was not felt to be consistent with stroke or TIA.  MRI Brain and a routine EEG was recommended for further evaluation.  On my evaluation, patient reports that she has noticed abnormal movements over the last 2 weeks. She would randomly drop things from her hand. Feels like this started around the time when she was started on Remeron and her Wellbutrin was cut down. Does not recall much about what happened today and does not seem to be aware of episode of R sided weakness. Reports that she was tired, lethargic and intensely nauseous for a moment.  She feels that the movements are particularly worse and seem to affect her L side more so than her right side. Daughter reports prior history of tremors with medications and that was maybe 2 months ago but nothing that she has seen recently.  Daughter does report  that patient has been dropping things recently from her hands.   ROS   Constitutional Denies weight loss, fever and chills.   HEENT Denies changes in vision and hearing.   Respiratory Denies SOB and cough.   CV Denies palpitations and CP   GI endorses abdominal pain, nausea,  but no vomiting and diarrhea.   GU Denies dysuria and urinary frequency.   MSK Denies myalgia and joint pain.   Skin Denies rash and pruritus.   Neurological Endorses headache but no syncope.   Psychiatric Denies recent changes in mood. Denies anxiety and depression.    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)    Pseudomonas respiratory infection    Thyroid disease    Past Surgical History:  Procedure Laterality Date   ABDOMINAL HYSTERECTOMY     BRONCHOSCOPY  2001   CARDIAC SURGERY     COLONOSCOPY  2008   CYST REMOVAL TRUNK  1965   Left axilla; benign   DILATION AND CURETTAGE OF UTERUS  1975   HAND SURGERY Right 1983   ICD IMPLANT Left 02/01/2020   PATENT DUCTUS ARTERIOUS REPAIR  1952   RIGHT HEART CATH N/A 03/23/2020   Procedure: RIGHT HEART CATH;  Surgeon: Jolaine Artist, MD;  Location: [place] [facility];  Service: Cardiovascular;  Laterality:  N/A;   TONSILLECTOMY  1956   VARICOSE VEIN SURGERY  1972   Family History  Problem Relation Age of Onset   Thyroid disease Daughter    Social History   Socioeconomic History   Marital status: Widowed    Spouse name: Not on file   Number of children: Not on file   Years of education: Not on file   Highest education level: Not on file  Occupational History   Not on file  Tobacco Use   Smoking status: Never    Passive exposure: Never   Smokeless tobacco: Never  Vaping Use   Vaping Use: Never used  Substance and Sexual Activity   Alcohol use: No   Drug use: No   Sexual activity: Not  Currently  Other Topics Concern   Not on file  Social History Narrative   Not on file   Social Determinants of Health   Financial Resource Strain: Not on file  Food Insecurity: Not on file  Transportation Needs: Not on file  Physical Activity: Not on file  Stress: Not on file  Social Connections: Not on file   Allergies  Allergen Reactions   Losartan Cough   Amikacin Other (See Comments)    nephropathy    Cefepime Other (See Comments)    Tremors Slurred speech  AKI   Ethambutol Other (See Comments)    neuropathy   Levaquin [Levofloxacin] Other (See Comments)    Fainting and weakness   Parafon Forte Dsc [Chlorzoxazone] Hives   Rifabutin Diarrhea   Tequin [Gatifloxacin] Other (See Comments)    Fainting and weakness   Zyvox [Linezolid] Diarrhea   Lisinopril Nausea Only    Very bitter taste in mouth caused anorexia    Medications   Medications Prior to Admission  Medication Sig Dispense Refill Last Dose   acetaminophen (TYLENOL) 650 MG CR tablet Take 650 mg by mouth every 8 (eight) hours as needed for pain.    unk   aspirin EC 81 MG tablet Take 81 mg by mouth daily.   Past Week   buPROPion (WELLBUTRIN XL) 300 MG 24 hr tablet Take 150 mg by mouth daily.   Past Week   fluticasone (FLONASE) 50 MCG/ACT nasal spray Place 1 spray into both nostrils daily as needed for allergies or rhinitis.   unk   gabapentin (NEURONTIN) 300 MG capsule Take 300 mg by mouth 2 (two) times daily.   Past Week   ipratropium-albuterol (DUONEB) 0.5-2.5 (3) MG/3ML SOLN Take 3 mLs by nebulization 3 (three) times daily as needed. (Patient taking differently: Take 3 mLs by nebulization 2 (two) times daily.) 810 mL 1 unk   lansoprazole (PREVACID) 30 MG capsule Take 1 capsule (30 mg total) by mouth 2 (two) times daily before a meal. 60 capsule 3 Past Week   levothyroxine (SYNTHROID, LEVOTHROID) 50 MCG tablet Take 50-100 mcg by mouth See admin instructions. Take 50 mg  tablet daily on Monday through Friday   Take 100 mcg  Saturday and Sunday   Past Week   mirtazapine (REMERON) 15 MG tablet Take 15 mg by mouth at bedtime.   Past Week   Multiple Vitamin (MULTIVITAMIN WITH MINERALS) TABS tablet Take 1 tablet by mouth daily.   Past Week   nadolol (CORGARD) 20 MG tablet Take 1 tablet (20 mg total) by mouth 2 (two) times daily. (Patient taking differently: Take 10 mg by mouth 2 (two) times daily.) 60 tablet 6 Past Week   OXYGEN Inhale 2-3 L into the  lungs See admin instructions. As needed and at bedtime use 3 liters with CPAP   04/07/2021   polyethylene glycol powder (GLYCOLAX/MIRALAX) 17 GM/SCOOP powder Take 17 g by mouth daily.   unk   rosuvastatin (CRESTOR) 10 MG tablet Take 10 mg by mouth daily.   Past Week   sodium chloride HYPERTONIC 3 % nebulizer solution Take 4 mLs by nebulization 2 (two) times daily as needed for other. 720 mL 2 unk   Melatonin 10 MG TABS Take 1 tablet by mouth at bedtime. (Patient not taking: Reported on 04/07/2021)   Not Taking   Nebulizers (COMPRESSOR/NEBULIZER) MISC 1 Product by Does not apply route once for 1 dose. (Patient not taking: Reported on 04/07/2021) 1 each 0 Not Taking     Vitals   Vitals:   04/07/21 1458 04/07/21 1649 04/07/21 1943 04/07/21 2000  BP: 111/74 (!) 114/94  (!) 107/92  Pulse: 84 80    Resp: 18 19    Temp: (!) 97.4 F (36.3 C)   (!) 97.3 F (36.3 C)  TempSrc:    Oral  SpO2: 96% 100% 97% 96%  Weight:      Height:         Body mass index is 15.66 kg/m.  Physical Exam   General: Laying comfortably in bed; in no acute distress.  HENT: Normal oropharynx and mucosa. Normal external appearance of ears and nose.  Neck: Supple, no pain or tenderness  CV: No JVD. No peripheral edema.  Pulmonary: Symmetric Chest rise. Normal respiratory effort.  Abdomen: Soft to touch, non-tender.  Ext: No cyanosis, edema, or deformity  Skin: No rash. Normal palpation of skin.   Musculoskeletal: Normal digits and nails by inspection. No clubbing.    Neurologic Examination  Mental status/Cognition: Alert, oriented to self, place(knows she is in a hospital but thinks this is [facility]), month but not year, good attention.  Speech/language: Fluent, comprehension intact, object naming intact, repetition intact.  Cranial nerves:   CN II Pupils equal and reactive to light, no VF deficits    CN III,IV,VI EOM intact, no gaze preference or deviation, no nystagmus    CN V normal sensation in V1, V2, and V3 segments bilaterally    CN VII no asymmetry, no nasolabial fold flattening    CN VIII normal hearing to speech    CN IX & X normal palatal elevation, no uvular deviation    CN XI 5/5 head turn and 5/5 shoulder shrug bilaterally    CN XII midline tongue protrusion    Motor:  Muscle bulk: poor, tone normal.  Has choreiform movements that are more prominent in LUE but also notable in RUE with intermittent sudden loss of tone in entire LUE. Mvmt Root Nerve  Muscle Right Left Comments  SA C5/6 Ax Deltoid     EF C5/6 Mc Biceps 5 4+   EE C6/7/8 Rad Triceps 5 4+   WF C6/7 Med FCR     WE C7/8 PIN ECU     F Ab C8/T1 U ADM/FDI 5 5   HF L1/2/3 Fem Illopsoas 4+ 3   KE L2/3/4 Fem Quad     DF L4/5 D Peron Tib Ant 5 5   PF S1/2 Tibial Grc/Sol 5 5    Reflexes:  Right Left Comments  Pectoralis      Biceps (C5/6) 2 2   Brachioradialis (C5/6) 2 2    Triceps (C6/7) 2 2    Patellar (L3/4) 2 2  Achilles (S1)      Hoffman      Plantar     Jaw jerk    Sensation:  Light touch Intact BL   Pin prick    Temperature    Vibration   Proprioception    Coordination/Complex Motor:  - Finger to Nose intact BL - Heel to shin unable to do. - Rapid alternating movement are slowed in LUE - Gait: unsafe to assess given movements on the left.  Labs   CBC:  Recent Labs  Lab 04/05/2021 1259 04/07/21 0621 04/07/21 1704  WBC 12.3* 10.3 10.5  NEUTROABS 9.7*  --  7.5  HGB 13.9 12.7 12.2  HCT 46.9* 43.4 41.9  MCV 101.1* 104.6* 102.7*   PLT 319 329 160    Basic Metabolic Panel:  Lab Results  Component Value Date   NA 135 04/07/2021   K 5.9 (H) 04/07/2021   K 5.8 (H) 04/07/2021   CO2 33 (H) 04/07/2021   GLUCOSE 141 (H) 04/07/2021   BUN 62 (H) 04/07/2021   CREATININE 2.55 (H) 04/07/2021   CALCIUM 8.4 (L) 04/07/2021   GFRNONAA 19 (L) 04/07/2021   GFRAA 22 (L) 07/17/2019   Lipid Panel: No results found for: LDLCALC HgbA1c: No results found for: HGBA1C Urine Drug Screen: No results found for: LABOPIA, COCAINSCRNUR, LABBENZ, AMPHETMU, THCU, LABBARB  Alcohol Level No results found for: ETH  CT Head without contrast(Personally reviewed): CTH was negative for a large hypodensity concerning for a large territory infarct or hyperdensity concerning for an ICH.  MRI Brain: pending  rEEG:  pending  Impression   Damisha Wolff is a 75 y.o. female with PMH significant for chronic hypoxic respiratory failure on Oxygen, MAI infection, OSA, severe CHF s/p ICD placement, who is admitted for epigastric pain and poor po food and fluids intake and failure to thrive with significant weight loss. Had brief episode of R sided weakness with delayed responses and evaluated by teleneuro and noted to have L sided choreiform movements.   Unsure what to make of the entirety of her presentation.   Episode of brief delayed responses with R sided weakness could have been a brief seizure. She is on wellbutrin which was cut down 2 weeks ago but has been on it for a couple years. Would certainly not expect wellbutrin to cause seizure after the dose was cut down.  She was started on Remeron a couple weeks ago, I am unable to get an accurate time on when her movements were started but high suspicion based on clinical history that the noted choreiform movements started after she was put on Remeron and worsened over the last several hours around the time of worsening of her kidney function. There are some clinical studies suggesting like between  Remeron and movement disorders. Elderly population is at highest risk.  I would recommend holding off on Mirtazapine and evaluating for any improvement.   Agree with attempting MRI Brain if possible and a routine EEG. Basal ganglia strokes can cause movement disorders. Wellbutrin also puts her at risk for rEEG but her dose was recently cut in half.  Unlikely for this to be Wilsons disease or Celiac. Will get ASO titers to evaluate for potential Sydenhams chorea.  Recommendations  - Discontinue Mirtazapine. - recommend routine EEG - Recommend MRI Brain. If unable to get MRI Brain 2/2 ICD, would recommend repeat CTH. - ASO titers. ______________________________________________________________________   Thank you for the opportunity to take part in the care of this patient. If  you have any further questions, please contact the neurology consultation attending.  Signed,  [place] Pager Number [phone] _ _ _   _ __   _ __ _ _  __ __   _ __   __ _

## 2021-04-08 NOTE — Progress Notes (Signed)
Initial Nutrition Assessment  DOCUMENTATION CODES:   Underweight  INTERVENTION:   -Ensure Enlive po BID, each supplement provides 350 kcal and 20 grams of protein  -Multivitamin with minerals daily  NUTRITION DIAGNOSIS:   Inadequate oral intake related to poor appetite, lethargy/confusion as evidenced by per patient/family report.  GOAL:   Patient will meet greater than or equal to 90% of their needs  MONITOR:   Supplement acceptance, Labs, PO intake, Weight trends, I & O's  REASON FOR ASSESSMENT:   Consult Assessment of nutrition requirement/status  ASSESSMENT:   75 year old chronically ill-appearing cachectic and thin Caucasian female with past medical history significant for but not limited to bronchiectasis, CAD, CKD stage III, hypertension, hypothyroidism, neuropathy, cytopenia, CHF, MAC as well as other comorbidities who presents with ongoing abdominal pain.  Patient currently alert/oriented x 3.  Consuming 25% of breakfast this morning. Consumed 10-100% of meals yesterday.  Per family, pt has not been eating or drinking well. Has been having intermittent abdominal pain. H/o dysphagia as well to pills and solid food.  Neurology following d/t AMS and left sided choreiform movements, work-up ongoing. Plan is for transfer to [facility] today.  Ensure supplements have been ordered, will continue given suspected malnutrition.   Per weight records, pt has lost 11 lbs since 5/11 (9% wt loss x 7 months, insignificant for time frame).  Medications: Remeron, Multivitamin with minerals daily, Miralax  Labs reviewed: Elevated K (5.3) -trending down Elevated Phos (5.9) Folate WNL  NUTRITION - FOCUSED PHYSICAL EXAM:  Unable to complete, working remotely.  Diet Order:   Diet Order             Diet Heart Room service appropriate? Yes; Fluid consistency: Thin  Diet effective now                   EDUCATION NEEDS:   No education needs have been identified at this  time  Skin:  Skin Assessment: Reviewed RN Assessment  Last BM:  PTA  Height:   Ht Readings from Last 1 Encounters:  04/04/2021 _0  (1.727 m)    Weight:   Wt Readings from Last 1 Encounters:  03/26/2021 46.7 kg    BMI:  Body mass index is 15.66 kg/m.  Estimated Nutritional Needs:   Kcal:  1700-1900  Protein:  80-90g  Fluid:  1.7L/day  Clayton Bibles, MS, RD, LDN Inpatient Clinical Dietitian Contact information available via Amion

## 2021-04-08 NOTE — Progress Notes (Signed)
Pt states she had trouble with the CPAP last night and wishes to hold off on it for tonight.

## 2021-04-09 ENCOUNTER — Inpatient Hospital Stay (HOSPITAL_COMMUNITY): Payer: Medicare HMO

## 2021-04-09 ENCOUNTER — Inpatient Hospital Stay (HOSPITAL_COMMUNITY)
Admit: 2021-04-09 | Discharge: 2021-04-09 | Disposition: A | Discharge status: Home / Self Care | Payer: Medicare HMO | Attending: Neurology | Admitting: Neurology

## 2021-04-09 DIAGNOSIS — J479 Bronchiectasis, uncomplicated: Secondary | ICD-10-CM

## 2021-04-09 DIAGNOSIS — R479 Unspecified speech disturbances: Secondary | ICD-10-CM

## 2021-04-09 DIAGNOSIS — R404 Transient alteration of awareness: Secondary | ICD-10-CM

## 2021-04-09 DIAGNOSIS — Z7189 Other specified counseling: Secondary | ICD-10-CM

## 2021-04-09 LAB — COMPREHENSIVE METABOLIC PANEL
ALT: 18 U/L (ref 0–44)
AST: 18 U/L (ref 15–41)
Albumin: 2.3 g/dL — ABNORMAL LOW (ref 3.5–5.0)
Alkaline Phosphatase: 74 U/L (ref 38–126)
Anion gap: 4 — ABNORMAL LOW (ref 5–15)
BUN: 65 mg/dL — ABNORMAL HIGH (ref 8–23)
CO2: 36 mmol/L — ABNORMAL HIGH (ref 22–32)
Calcium: 8.9 mg/dL (ref 8.9–10.3)
Chloride: 100 mmol/L (ref 98–111)
Creatinine, Ser: 2.15 mg/dL — ABNORMAL HIGH (ref 0.44–1.00)
GFR, Estimated: 23 mL/min — ABNORMAL LOW (ref >60)
Glucose, Bld: 109 mg/dL — ABNORMAL HIGH (ref 70–99)
Potassium: 5.2 mmol/L — ABNORMAL HIGH (ref 3.5–5.1)
Sodium: 140 mmol/L (ref 135–145)
Total Bilirubin: 0.6 mg/dL (ref 0.3–1.2)
Total Protein: 6.1 g/dL — ABNORMAL LOW (ref 6.5–8.1)

## 2021-04-09 LAB — CBC WITH DIFFERENTIAL/PLATELET
Abs Immature Granulocytes: 0.05 10*3/uL (ref 0.00–0.07)
Basophils Absolute: 0.1 10*3/uL (ref 0.0–0.1)
Basophils Relative: 1 %
Eosinophils Absolute: 0.1 10*3/uL (ref 0.0–0.5)
Eosinophils Relative: 1 %
HCT: 37.2 % (ref 36.0–46.0)
Hemoglobin: 10.7 g/dL — ABNORMAL LOW (ref 12.0–15.0)
Immature Granulocytes: 1 %
Lymphocytes Relative: 13 %
Lymphs Abs: 1.2 10*3/uL (ref 0.7–4.0)
MCH: 30.4 pg (ref 26.0–34.0)
MCHC: 28.8 g/dL — ABNORMAL LOW (ref 30.0–36.0)
MCV: 105.7 fL — ABNORMAL HIGH (ref 80.0–100.0)
Monocytes Absolute: 0.8 10*3/uL (ref 0.1–1.0)
Monocytes Relative: 8 %
Neutro Abs: 7.1 10*3/uL (ref 1.7–7.7)
Neutrophils Relative %: 76 %
Platelets: 247 10*3/uL (ref 150–400)
RBC: 3.52 MIL/uL — ABNORMAL LOW (ref 3.87–5.11)
RDW: 16.1 % — ABNORMAL HIGH (ref 11.5–15.5)
WBC: 9.2 10*3/uL (ref 4.0–10.5)
nRBC: 0.4 % — ABNORMAL HIGH (ref 0.0–0.2)

## 2021-04-09 LAB — URINE CULTURE: Culture: NO GROWTH

## 2021-04-09 LAB — PHOSPHORUS: Phosphorus: 4.2 mg/dL (ref 2.5–4.6)

## 2021-04-09 LAB — POTASSIUM
Potassium: 5.8 mmol/L — ABNORMAL HIGH (ref 3.5–5.1)
Potassium: 5.4 mmol/L — ABNORMAL HIGH (ref 3.5–5.1)
Potassium: 4.9 mmol/L (ref 3.5–5.1)
Potassium: 5.3 mmol/L — ABNORMAL HIGH (ref 3.5–5.1)

## 2021-04-09 LAB — MAGNESIUM: Magnesium: 2.3 mg/dL (ref 1.7–2.4)

## 2021-04-09 LAB — ANTISTREPTOLYSIN O TITER: ASO: 56 IU/mL (ref 0.0–200.0)

## 2021-04-09 IMAGING — CT CT HEAD W/O CM
4 series · 16 of 47 positions shown, 18 images · non-contrast
Comparison: Prior CT from [DATE].

CLINICAL DATA: Follow-up examination for acute stroke.

EXAM:
CT HEAD WITHOUT CONTRAST
TECHNIQUE: Contiguous axial images were obtained from the base of the skull
through the vertex without intravenous contrast.

[Series 2: head wo · axial · 0.47mm/px · z∈[-153,-43]mm · 7 of 30 slices shown, 9 images]
[im 4/30  brain]
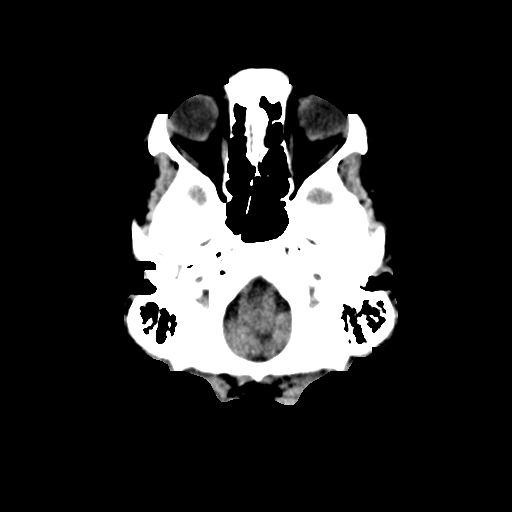
[im 4/30  bone]
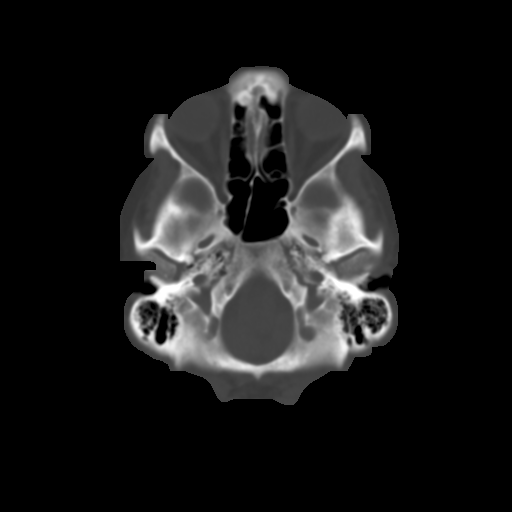
[im 8/30  brain]
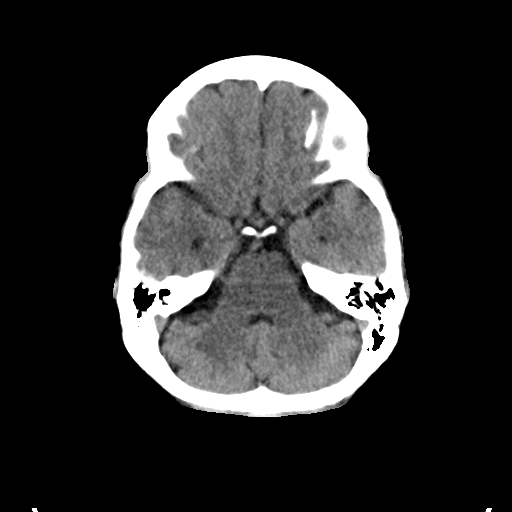
[im 11/30  brain]
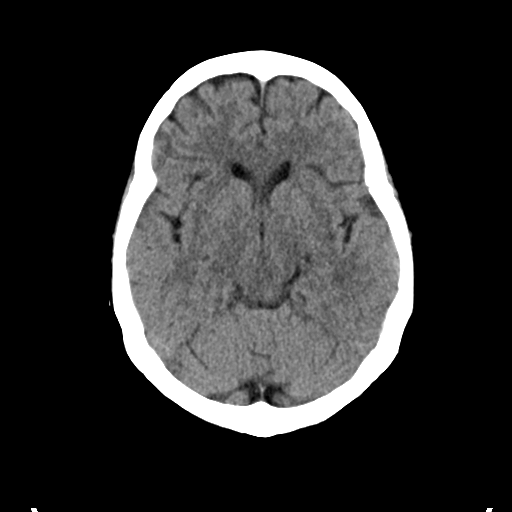
[im 15/30  brain]
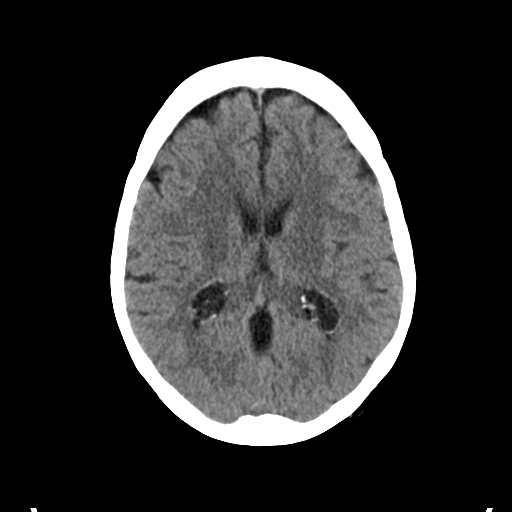
[im 19/30  brain]
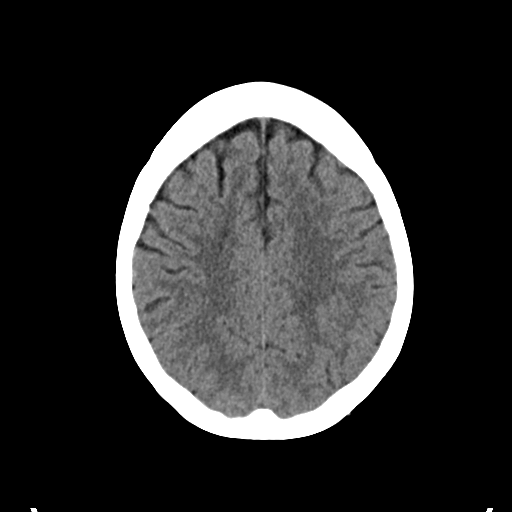
[im 19/30  bone]
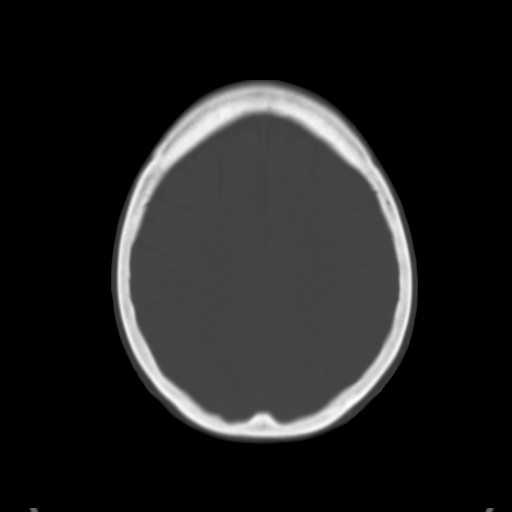
[im 22/30  brain]
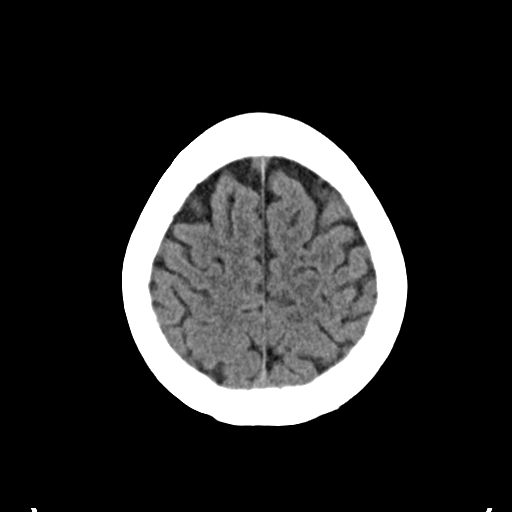
[im 26/30  brain]
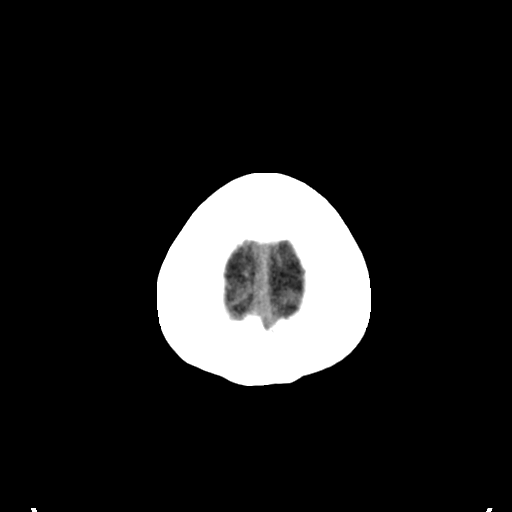

[Series 3: head bone · axial · 0.47mm/px · z∈[-154,-126]mm · 3 of 73 slices shown]
[im 8/73  bone]
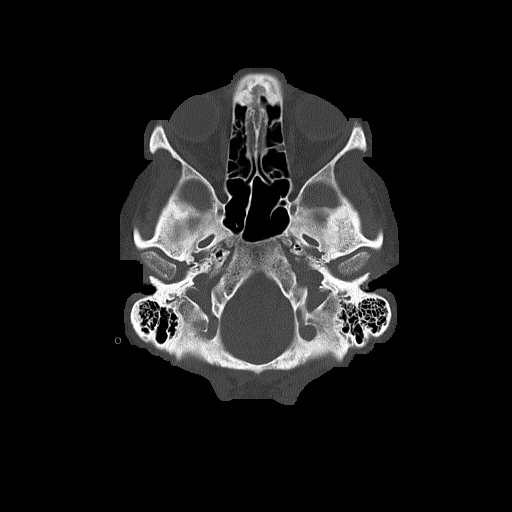
[im 15/73  bone]
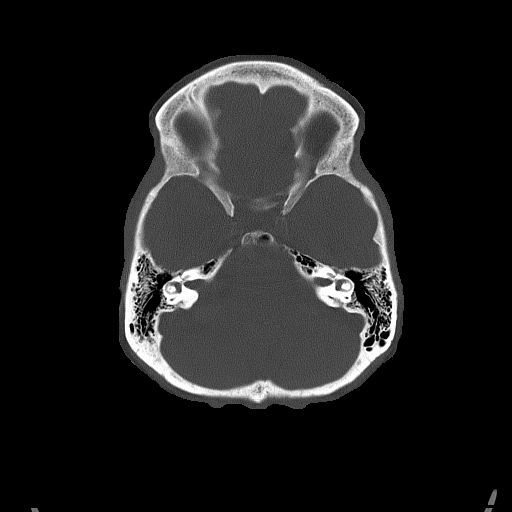
[im 22/73  bone]
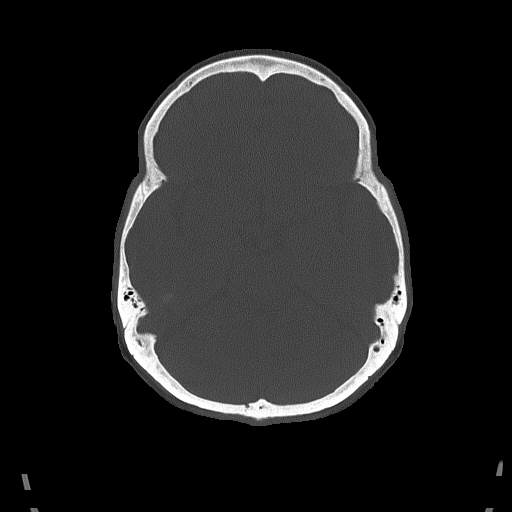

[Series 4: coronal soft tissue · coronal · 0.30mm/px · 3 of 64 slices shown]
[im 22/64  brain]
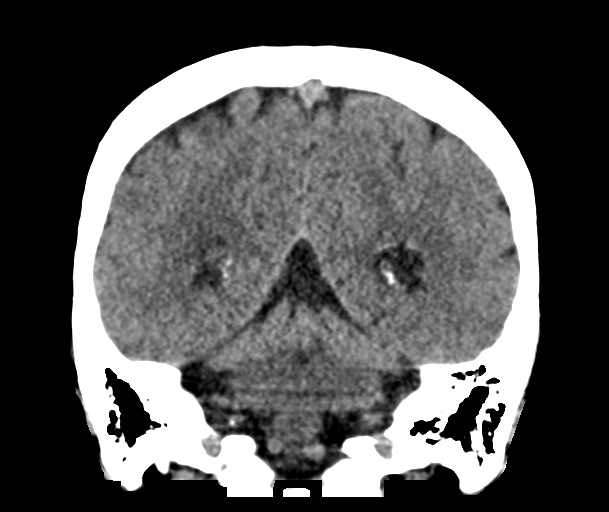
[im 29/64  brain]
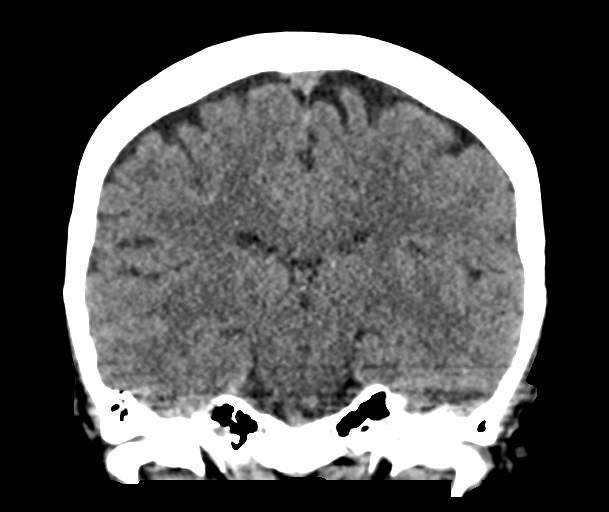
[im 36/64  brain]
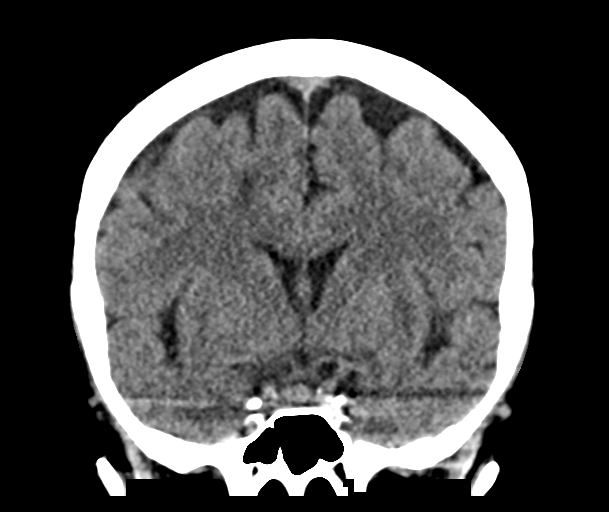

[Series 5: sagittal soft tissue · sagittal · 0.33mm/px · 3 of 51 slices shown]
[im 17/51  brain]
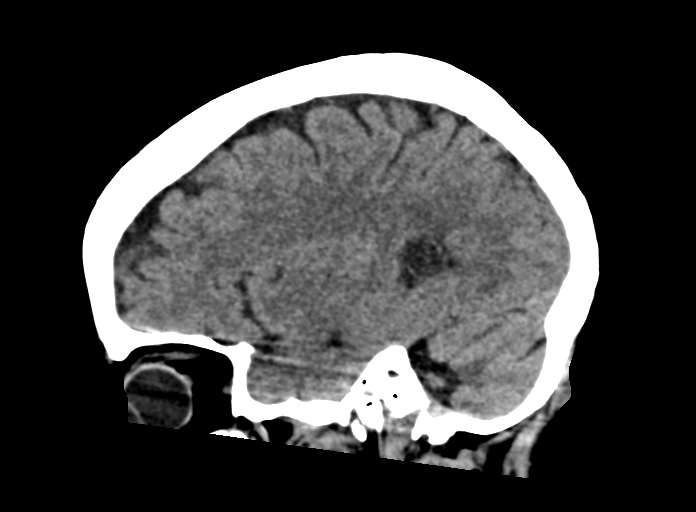
[im 26/51  brain]
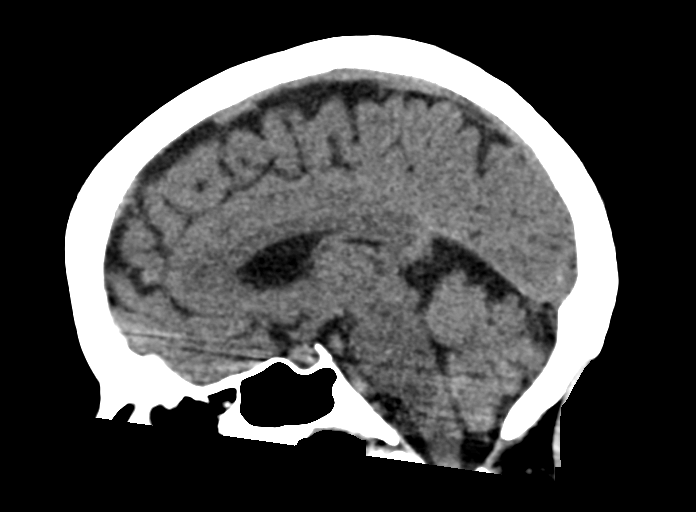
[im 34/51  brain]
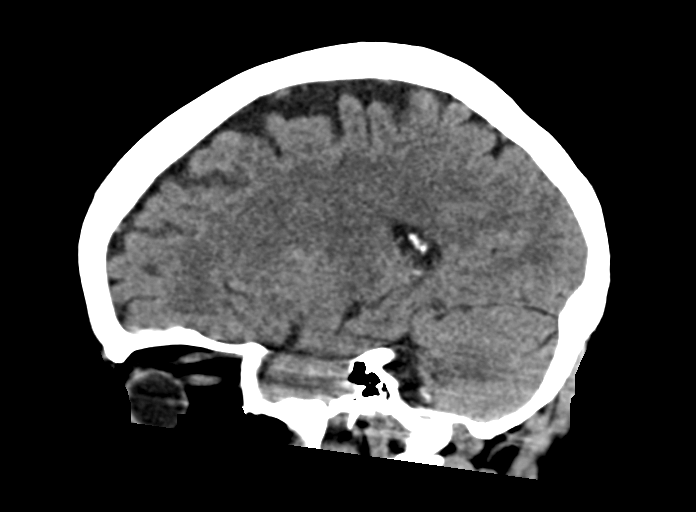

[16 of 47 positions shown; findings below may reference images not displayed]

FINDINGS: Brain: Cerebral volume within normal limits for age. Small remote
lacunar infarct present at the anterior right basal ganglia, stable.
Mild chronic microvascular ischemic disease. No acute large vessel
territory infarct. No acute intracranial hemorrhage. No mass lesion,
midline shift or mass effect. No hydrocephalus or extra-axial fluid
collection.

Vascular: No hyperdense vessel. Scattered vascular calcifications
noted within the carotid siphons.

Skull: Scalp soft tissues and calvarium within normal limits.

Sinuses/Orbits: Globes and orbital soft tissues demonstrate no acute
finding. Mild scattered mucosal thickening noted within the
ethmoidal air cells. Paranasal sinuses are otherwise largely clear.
Trace right mastoid effusion, of doubtful significance.

Other: None.
IMPRESSION: 1. Stable head CT. No acute intracranial abnormality.
2. Mild chronic microvascular ischemic disease with small remote
lacunar infarct at the anterior right basal ganglia, stable.

## 2021-04-09 MED ORDER — THIAMINE HCL 100 MG/ML IJ SOLN
250.0000 mg | Freq: Every day | INTRAVENOUS | Status: DC
  Administered 2021-04-11 – 2021-04-14 (×4): 250 mg via INTRAVENOUS
  Filled 2021-04-09 (×5): qty 2.5

## 2021-04-09 MED ORDER — THIAMINE HCL 100 MG/ML IJ SOLN: 100.0000 mg | Freq: Every day | INTRAMUSCULAR | Status: DC

## 2021-04-09 MED ORDER — THIAMINE HCL 100 MG/ML IJ SOLN
500.0000 mg | Freq: Three times a day (TID) | INTRAVENOUS | Status: AC
  Administered 2021-04-09 – 2021-04-11 (×6): 500 mg via INTRAVENOUS
  Filled 2021-04-09 (×7): qty 5

## 2021-04-09 MED ORDER — SODIUM ZIRCONIUM CYCLOSILICATE 5 G PO PACK
10.0000 g | PACK | Freq: Once | ORAL | Status: DC
  Filled 2021-04-09 (×2): qty 1
  Filled 2021-04-09: qty 2

## 2021-04-09 NOTE — Procedures (Signed)
PROGRESS NOTE    Stacy Sanchez  WYO:[id number] DOB: Jul 15, 1945 DOA: 04/13/2021 PCP: Burnett Sheng, MD   Brief Narrative:  The patient is a 75 year old chronically ill-appearing cachectic and thin Caucasian female with past medical history significant for but not limited to bronchiectasis, CAD, CKD stage III, hypertension, hypothyroidism, neuropathy, cytopenia, CHF, MAC as well as other comorbidities who presents with ongoing abdominal pain.  Patient's had epigastric abdominal pain for the last few days and this is had similar pain waxing waning for the last week or so.  Pain lasted about an hour and a half and is improved when she lays down rupture abdomen.  She had an episode of nausea without vomiting and has had a chronic cough as well.  She has had decreased p.o. intake and was planning to see GI for abdominal pain but this was rescheduled.  Was also noted to have extremely poor p.o. intake and a 30 pound weight loss in the last year or so.  Further work-up in the ED revealed that she had a hyperkalemia and elevated BUN/creatinine as well as mild leukocytosis.  BNP was elevated at 1600 however she did not appear volume overloaded.  Influenza panel and COVID were negative.  Urine blood cultures were obtained.  Chest x-ray was done and showed prominent interstitial markings and they were unable to exclude airspace disease.  Ultrasound abdomen showed mild wall thickening of the gallbladder without Murphy sign for possible cholecystitis.  She is initiated on doxycycline, low, given a liter of fluid in the ED.  She was then initiated on maintenance IV fluid hydration and given her abdominal pain GI was consulted.  She did have very minimally elevated troponin was flat but cardiology was consulted for further evaluation.  Palliative care is also been consulted.  During her hospitalization she ended up having a transient episode of slurred speech so she went for a stat head CT scan and a telemetry neurology  consult was initiated.  Neurology evaluated and recommending an MRI that is pacemaker compatible at [facility] as well as the EEG as well as frequent neurochecks.  Neurology recommends stopping the Remeron and having further work-up done at [facility].  GI consulted given her weight loss and they discussed role of EGD and currently her not want to pursue this at this time and awaiting palliative care goals of care discussion for possible hospice discussion.  GI recommended continue buspirone for possible anxiety related to dyspepsia but awaiting palliative care and neurological work-up and recommending continuing to hold off on endoscopy at this time.  Palliative met with the patient and patient's family and after further goals of care discussion her CODE STATUS was changed to DNR, DNI.  The family did not want a feeding tube and the recommendation was for deactivation for ICD.  The family was to let the patient be transferred to [facility] for further neuro work-up before finally making decision but they are open to hospice support.  Likely recommendation will be discharged back to [facility] with hospice support.  EEG was done and currently still awaiting transfer to [facility] for MRI due to her pacer.  Assessment & Plan:   Principal Problem:   Adult failure to thrive Active Problems:   CAD (coronary artery disease)   CKD (chronic kidney disease), stage III (HCC)   Essential hypertension   Hypothyroidism   Anxiety state   Major depressive disorder with single episode, in partial remission ([place])   Memory loss of unknown cause  OSA on CPAP   Abnormal EKG   Hyperkalemia   Pain of upper abdomen   Unintentional weight loss  Adult failure to thrive Generalized weakness and deconditioning Dysphagia with anorexia > Admitted for Adult failure considering patient has lost 30 pounds last year and had minimal p.o. intake with recurrent abdominal pain. - Potassium is elevated will check other  electrolytes including magnesium and phosphorus. -Estimated body mass index is 15.66 kg/m as calculated from the following:   Height as of this encounter: _0  (1.727 m).   Weight as of this encounter: 46.7 kg. - Nutrition consult has been placed and pending -Given her significant abdominal pain and decreased p.o. intake a CT scan of the abdomen pelvis has been done and GIs been consulted and they evaluated and discussed EGD and they do not want to pursue EGD at this time -We will start gentle IV fluid hydration as above -PT OT consulted for further evaluation recommendation - Supportive care and palliative care has been consulted for further evaluation and she was made DNR/DNI.  Family considering hospice support but want to have further evaluation by neurology first prior to making any decisions -On supplementation with Ensure Enlive 237 mL p.o. twice daily -Seen by GI by Dr. Silverio Decamp about a month ago and had a subsequent esophagram which showed moderate esophageal dysmotility    Abdominal pain -As above -CT scan of the abdomen pelvis ordered and showed "Stable left renal calculi without obstructive change. The known lesions within the left kidney are not well evaluated on this exam due to lack of IV contrast. These have been evaluated on prior ultrasound and shown to represent simple and complex renal cysts. Progressive mucous plugging particularly in the right lower lobe in areas of bronchiectasis when compared with the prior exam. New left basilar atelectasis is noted when compare with the prior study. No other focal abnormality is noted."   -GI consulted for further evaluation and they discussed EGD but will hold off at this time -Currently awaiting palliative care discussions and she is now DNR/DNI.  Family does not want a feeding tube  Hyperkalemia > Potassium elevated to 6 initially and then again on repeat.  We will continue with Lokelma and trend potassium level and stop once  returning to normal. -Trend potassium and repeat potassium is 5.2 - Continue Lokelma and give another dose now however the pharmacy does not have it as we will need to continue to monitor closely -Repeat labs this evening   Leukocytosis ?  Pneumonia in the setting of known bronchiectasis and MAI > Noted to have mild leukocytosis 12.3 in the ED which improved to 10.3.  Also chest x-ray with inability to rule out airspace disease. > Doxycycline ordered in the ED will continue this for now. > Also ordered in the ED were urinalysis and blood cultures. - Continue with doxycycline for now -Started on Xopenex and Atrovent - Follow-up urinalysis and blood cultures - Trend fever curve and white count as she is afebrile and WBC is 9.2   EKG abnormality with minimally elevated troponins > New T wave inversions.  We will check and trend troponin.  Also notably BNP elevated. - Troponin flat at 86 and likely in the setting of demand ischemia -Cardiology was consulted given patient's family's request and they suspect demand ischemia given no CAD on cath 01/30/2020.  Patient denies any chest pain but has chronic dyspnea and is progressively worsening in the setting of chronic MAI infection and bronchiectasis -Cardiology  feels that we need to continue with supportive care no indication for ischemic work-up is indicated at this time -After palliative discussions recommendations for deactivation of ICD   Transient slurred speech and right-sided weakness with delayed responses Left-sided choreiform movements -Initially happened and when patient was pale but now speech is improved.  Patient continues to perseverate and when asked when the year she keeps saying as of December -Code stroke was activated after my discussion with the neurologist at [facility] without contrast has been ordered and telemetry neurology to see the patient and head CT was negative but neurology recommending MRI and EEG  -EEG  showed suggestive moderate diffuse encephalopathy of nonspecific etiology with no seizures or definitive epileptiform discharges -Neurology recommends transfer to [facility] for further evaluation with MRI compatible pacer.  MRI pending to be done and transfer still pending -Further care per neurology and  awaiting transfer to [facility] for further work-up with MRI -Pending MRI results and neurology evaluation patient may go hospice  Chronic systolic CHF > Known history of CHF last echo was last month with EF 45-50% and grade 1 diastolic dysfunction.;  She does have an echo at [facility] which showed an EF short of 20 to 25% > BNP was elevated in the ED to greater than 1600 however patient does not appear volume overloaded and received some fluid which will resume -Her beta-blocker was held due to soft blood pressures -She is unable to tolerate losartan or lisinopril due to cough and low blood pressures -She is off of digoxin with recovery of LV function -Cardiology recommending avoiding spironolactone due to hyperkalemia in the past -We have initiated IV fluid hydration with his gentle at 75 MLS per hour > We will monitor for any evidence of worsening respirations and consider CHF exacerbation at that time.  We will also be checking troponin as above concerning new EKG changes. - Holding home nadolol in the setting of low normal blood pressures - Not currently on daily diuretic -She is + 2.906 L since admission -Cardiology recommends palliative care consultation and this has been ordered   Anxiety Depression - Continue home medications when confirmed - Reportedly on Remeron which will be resumed but now discontinued at the recommendation of neurology -Patient seems little bit anxious and may need to discuss with care about anxiety regimen given that the mirtazapine caused some choreiform movements -We will have palliative care address for anxiety and depression  Hypothyroidism - Continue  home Synthroid of 150 mcg and check TSH in the morning; initial TSH was 4.257 and trended up to 12.757 and is now 7.285  Hypertension with history of orthostatic hypotension -Her recent midodrine was stopped given her systolic blood pressures been elevated previously -Nadolol has been held on admission due to her softer blood pressure -Continue to monitor blood pressures per protocol  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 by bronchiectasis with recent pseudomonal infection -Continue supplemental oxygen and normally wears 2 L during the day and 2 L at night -SpO2: 96 % O2 Flow Rate (L/min): 3 L/min FiO2 (%): 32 % -Palliative care consulted for further goals of care discussion and she is now DNR/DNI   CAD Hyperlipidemia - Continue home rosuvastatin - Continue home aspirin -Cardiology following as above   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 > Creatinine stable at 1.37 on admission but i worsened to 66/2.58 and is now trending down to 65/2.15 - Avoid nephrotoxic meds, contrast dyes, hypotension renally dose  medications -Started gentle IV fluid hydration with normal saline at 75 mils per hour and will continue - Trend renal function electrolytes and will obtain a renal ultrasound, urine electrolytes as well as a urinalysis -We will consider Foley placement and will also likely discuss with nephrology in the morning if she continues to worsen   OSA - Continue home CPAP; refused last night  DVT prophylaxis: SCDs and enoxaparin 30 mg subcu q. 24 Code Status: FULL CODE Family Communication: Discussed with daughter at bedside Disposition Plan: Pending further clinical improvement and clearance by specialist as well as palliative care discussion.  Plan is to transfer the patient to [facility] for an MRI and further neurology evaluation  Status is: Inpatient  Remains inpatient appropriate because: Failure to Thrive and Poor Po Intake   Consultants:   Cardiology Gastroenterology  Neurology  Palliative Care  Procedures:  Head CT   Abdominal CT  U/S   Antimicrobials:  Anti-infectives (From admission, onward)    Start     Dose/Rate Route Frequency Ordered Stop   04/07/21 0600  doxycycline (VIBRA-TABS) tablet 100 mg        100 mg Oral Every 12 hours 03/31/2021 2245     04/02/2021 1700  doxycycline (VIBRA-TABS) tablet 100 mg        100 mg Oral  Once 04/05/2021 1658 03/24/2021 1745        Subjective: Seen and examined at bedside and she is more confused today and perseverating.  Continues to be extremely weak.  States that she has some pain.  No other concerns or points this time and only oriented to herself.  Objective: Vitals:   04/09/21 0631 04/09/21 0756 04/09/21 1335 04/09/21 1447  BP: 128/82   134/88  Pulse: 95   100  Resp: 16   18  Temp: 98.6 F (37 C)   (!) 97.5 F (36.4 C)  TempSrc: Oral   Axillary  SpO2: 97% 95% 95% 96%  Weight:      Height:        Intake/Output Summary (Last 24 hours) at 04/09/2021 1825 Last data filed at 04/09/2021 1700 Gross per 24 hour  Intake 1782.49 ml  Output 350 ml  Net 1432.49 ml    Filed Weights   04/09/2021 1307  Weight: 46.7 kg   Examination: Physical Exam:  Constitutional: Thin frail cachectic elderly female who is very fatigued and confused Eyes: Lids and conjunctivae normal, sclerae anicteric  ENMT: External Ears, Nose appear normal. Grossly normal hearing. Neck: Appears normal, supple, no cervical masses, normal ROM, no appreciable thyromegaly; no appreciable JVD Respiratory: Diminished to auscultation bilaterally with coarse breath sounds, no wheezing, rales, rhonchi or crackles. Normal respiratory effort and patient is not tachypenic. No accessory muscle use.  Wearing supplemental oxygen via nasal cannula Cardiovascular: RRR, no murmurs / rubs / gallops. S1 and S2 auscultated.  No appreciable extremity edema Abdomen: Soft, non-tender, non-distended. Bowel sounds  positive.  GU: Deferred. Musculoskeletal: No clubbing / cyanosis of digits/nails. No joint deformity upper and lower extremities but she is extremely frail and has some atrophy and cachectic Skin: No rashes, lesions, ulcers on limited skin evaluation. No induration; Warm and dry.  Neurologic: Cranial nerves II through XII grossly intact but she is confused and continues to have some Choreiform movements Psychiatric: Normal judgment and insight. Alert and oriented x 1. Normal mood and appropriate affect.   Data Reviewed: I have personally reviewed following labs and imaging studies  CBC: Recent Labs  Lab 03/22/2021  1259 04/07/21 0621 04/07/21 1704 04/08/21 0527 04/09/21 0141  WBC 12.3* 10.3 10.5 10.0 9.2  NEUTROABS 9.7*  --  7.5 7.3 7.1  HGB 13.9 12.7 12.2 11.3* 10.7*  HCT 46.9* 43.4 41.9 39.2 37.2  MCV 101.1* 104.6* 102.7* 104.8* 105.7*  PLT 319 329 342 285 903    Basic Metabolic Panel: Recent Labs  Lab 03/22/2021 1545 04/20/2021 2222 04/07/21 0133 04/07/21 0621 04/07/21 1346 04/07/21 1704 04/07/21 2155 04/08/21 0527 04/08/21 0840 04/08/21 2126 04/09/21 0141 04/09/21 0508 04/09/21 1355 04/09/21 1717  NA 141  --   --  138  --  135  --  138  --   --   --  140  --   --   K 6.0* 5.6*   < > 5.6*   5.6*   < > 5.8*   5.9*   < > 5.7*   < > 5.8* 5.4* 5.2* 4.9 5.3*  CL 95*  --   --  94*  --  93*  --  94*  --   --   --  100  --   --   CO2 40*  --   --  36*  --  33*  --  36*  --   --   --  36*  --   --   GLUCOSE 104*  --   --  100*  --  141*  --  115*  --   --   --  109*  --   --   BUN 47*  --   --  62*  --  62*  --  66*  --   --   --  65*  --   --   CREATININE 1.47*  --   --  2.13*  --  2.55*  --  2.58*  --   --   --  2.15*  --   --   CALCIUM 9.0  --   --  8.5*  --  8.4*  --  8.4*  --   --   --  8.9  --   --   MG  --  2.0  --   --   --  2.3  --  2.3  --   --  2.3  --   --   --   PHOS  --  7.5*  --   --   --  7.2*  --  5.9*  --   --  4.2  --   --   --    < > = values in this  interval not displayed.    GFR: Estimated Creatinine Clearance: 16.2 mL/min (A) (by C-G formula based on SCr of 2.15 mg/dL (H)). Liver Function Tests: Recent Labs  Lab 04/10/2021 1259 04/07/21 0621 04/07/21 1704 04/08/21 0527 04/09/21 0508  AST 60* 33 _0 ALT _1 ALKPHOS 144* 110 103 90 74  BILITOT 0.8 0.6 0.6 0.5 0.6  PROT 7.7 6.6 6.6 6.4* 6.1*  ALBUMIN 3.2* 2.4* 2.4* 2.3* 2.3*    Recent Labs  Lab 04/08/2021 1545  LIPASE <10*    Recent Labs  Lab 04/07/21 1704  AMMONIA 43*    Coagulation Profile: No results for input(s): INR, PROTIME in the last 168 hours. Cardiac Enzymes: No results for input(s): CKTOTAL, CKMB, CKMBINDEX, TROPONINI in the last 168 hours. BNP (last 3 results) No results for input(s): PROBNP in the last 8760 hours. HbA1C: No results for input(s):  HGBA1C in the last 72 hours. CBG: Recent Labs  Lab 04/07/21 1635 04/08/21 1153 04/08/21 1603 04/08/21 2117  GLUCAP 145* 135* 150* 128*    Lipid Profile: No results for input(s): CHOL, HDL, LDLCALC, TRIG, CHOLHDL, LDLDIRECT in the last 72 hours. Thyroid Function Tests: Recent Labs    04/08/21 0527  TSH 7.285*    Anemia Panel: Recent Labs    04/07/21 1704 04/08/21 0527  VITAMINB12 561  --   FOLATE  --  22.7    Sepsis Labs: Recent Labs  Lab 04/16/2021 1735 04/07/2021 2222  LATICACIDVEN 1.6 1.5     Recent Results (from the past 240 hour(s))  Resp Panel by RT-PCR (Flu A&B, Covid) Nasopharyngeal Swab     Status: None   Collection Time: 04/03/2021 12:59 PM   Specimen: Nasopharyngeal Swab; Nasopharyngeal(NP) swabs in vial transport medium  Result Value Ref Range Status   SARS Coronavirus 2 by RT PCR NEGATIVE NEGATIVE Final    Comment: (NOTE) SARS-CoV-2 target nucleic acids are NOT DETECTED.  The SARS-CoV-2 RNA is generally detectable in upper respiratory specimens during the acute phase of infection. The lowest concentration of SARS-CoV-2 viral copies this assay can  detect is 138 copies/mL. A negative result does not preclude SARS-Cov-2 infection and should not be used as the sole basis for treatment or other patient management decisions. A negative result may occur with  improper specimen collection/handling, submission of specimen other than nasopharyngeal swab, presence of viral mutation(s) within the areas targeted by this assay, and inadequate number of viral copies(<138 copies/mL). A negative result must be combined with clinical observations, patient history, and epidemiological information. The expected result is Negative.  Fact Sheet for Patients:  EntrepreneurPulse.com.au  Fact Sheet for Healthcare Providers:  IncredibleEmployment.be  This test is no t yet approved or cleared by the [place] FDA and  has been authorized for detection and/or diagnosis of SARS-CoV-2 by FDA under an Emergency Use Authorization (EUA). This EUA will remain  in effect (meaning this test can be used) for the duration of the COVID-19 declaration under Section 564(b)(1) of the Act, 21 U.S.C.section 360bbb-3(b)(1), unless the authorization is terminated  or revoked sooner.       Influenza A by PCR NEGATIVE NEGATIVE Final   Influenza B by PCR NEGATIVE NEGATIVE Final    Comment: (NOTE) The Xpert Xpress SARS-CoV-2/FLU/RSV plus assay is intended as an aid in the diagnosis of influenza from Nasopharyngeal swab specimens and should not be used as a sole basis for treatment. Nasal washings and aspirates are unacceptable for Xpert Xpress SARS-CoV-2/FLU/RSV testing.  Fact Sheet for Patients: EntrepreneurPulse.com.au  Fact Sheet for Healthcare Providers: IncredibleEmployment.be  This test is not yet approved or cleared by the [place] FDA and has been authorized for detection and/or diagnosis of SARS-CoV-2 by FDA under an Emergency Use Authorization (EUA). This EUA will remain in  effect (meaning this test can be used) for the duration of the COVID-19 declaration under Section 564(b)(1) of the Act, 21 U.S.C. section 360bbb-3(b)(1), unless the authorization is terminated or revoked.  Performed at [facility], [address] [place], [place] [address]   Culture, blood (routine x 2)     Status: None (Preliminary result)   Collection Time: 03/29/2021  5:30 PM   Specimen: BLOOD  Result Value Ref Range Status   Specimen Description   Final    BLOOD RIGHT ANTECUBITAL Performed at [facility], [address] [place], [place] [address]    Special Requests  Final    Blood Culture adequate volume BOTTLES DRAWN AEROBIC AND ANAEROBIC Performed at [facility], [address] [place], [place] [address]    Culture   Final    NO GROWTH 3 DAYS Performed at [facility], [place] [address], [place], [place] [address]    Report Status PENDING  Incomplete  Culture, blood (routine x 2)     Status: None (Preliminary result)   Collection Time: 04/09/2021 10:22 PM   Specimen: BLOOD  Result Value Ref Range Status   Specimen Description   Final    BLOOD RIGHT ANTECUBITAL Performed at [facility] [address], [place], [place] [address]    Special Requests   Final    BOTTLES DRAWN AEROBIC ONLY Blood Culture adequate volume Performed at [facility] [address] [place], [place] [address]    Culture   Final    NO GROWTH 2 DAYS Performed at [facility] [address], [place], [place] [address]    Report Status PENDING  Incomplete  Urine Culture     Status: None   Collection Time: 04/07/21  6:07 PM   Specimen: Urine, Clean Catch  Result Value Ref Range Status   Specimen Description   Final    URINE, CLEAN CATCH Performed at [facility], [place] [address], [place], [place] [address]    Special Requests   Final    NONE Performed at [facility], [place] [address], [place], [place] [address]    Culture   Final    NO GROWTH Performed at [facility], [place] [address] [place], [place] [address]    Report Status 04/09/2021 FINAL  Final  MRSA Next Gen by PCR, Nasal     Status: None   Collection Time: 04/08/21 11:30 AM   Specimen: Nasal Mucosa; Nasal Swab  Result Value Ref Range Status   MRSA by PCR Next Gen NOT DETECTED NOT DETECTED Final    Comment: (NOTE) The GeneXpert MRSA Assay (FDA approved for NASAL specimens only), is one component of a comprehensive MRSA colonization surveillance program. It is not intended to diagnose MRSA infection nor to guide or monitor treatment for MRSA infections. Test performance is not FDA approved in patients less than 74 years old. Performed at [facility], [place] [address] [place],  [address]      RN Pressure Injury Documentation:     Estimated body mass index is 15.66 kg/m as calculated from the following:   Height as of this encounter: _0  (1.727 m).   Weight as of this encounter: 46.7 kg.  Malnutrition Type: Nutrition Problem: Inadequate oral intake Etiology: poor appetite, lethargy/confusion Malnutrition Characteristics: Signs/Symptoms: per patient/family report Nutrition Interventions: Interventions: Ensure Enlive (each supplement provides 350kcal and 20 grams of protein), MVI  Radiology Studies: US RENAL  Result Date: 04/08/2021 CLINICAL DATA:  Acute kidney injury. EXAM: RENAL / URINARY TRACT ULTRASOUND COMPLETE COMPARISON:  CT new, 04/07/2021.  Renal ultrasound, 12/24/2020. FINDINGS: Right Kidney: Renal measurements: 7.5 x 3.2 x 5.6 cm = volume: 57 mL. Increased parenchymal echogenicity and diffuse cortical thinning. 2 simple appearing cysts, upper and midpole, 1.2 x 1.3 x 1.4 cm and 1.6 x 1.1 x 1.4 cm respectively. No other masses, no stones and no hydronephrosis. Left Kidney: Renal measurements: 9.1 x 4.6 x 5.0 cm =  volume: 109.1 mL. Increased parenchymal echogenicity. Small cysts, midpole cyst measuring 1.2 x 1.0 x 1.5  cm and lower pole cyst measuring 1.0 x 0.7 x 1.0 cm. Echogenic foci consistent nonobstructing calculi, largest 8 mm, lower pole. No hydronephrosis. Bladder: Appears normal for degree of bladder distention. Other: None. IMPRESSION: 1. Findings consistent with medical renal disease increased renal parenchymal echogenicity bilaterally. 2. No acute findings.  No hydronephrosis. 3. Bilateral renal cysts and left intrarenal stone. 4. Right kidney measures smaller than on the prior ultrasound, current volume 57 mL, previous volume 112 mL. Electronically Signed   By: Lajean Manes M.D.   On: 04/08/2021 09:48   DG CHEST PORT 1 VIEW  Result Date: 04/08/2021 CLINICAL DATA:  75 year old female with history of shortness of breath. EXAM: PORTABLE CHEST 1 VIEW COMPARISON:  Chest x-ray 04/20/2021. FINDINGS: Widespread areas of interstitial prominence and patchy airspace disease are noted throughout the lungs bilaterally, worsened compared to the prior study, particularly in the right upper lobe near the apex. Bilateral apical pleuroparenchymal thickening, similar to the prior study, but worsened when compared to more remote prior examination from 12/24/2020, particularly on the right, suggesting the possibility of some loculated right apical pleural fluid. Small bilateral pleural effusions also noted in the bases. Pulmonary vasculature is largely obscured. Heart size is mildly enlarged. Upper mediastinal contours are within normal limits. Atherosclerotic calcifications are noted in the thoracic aorta. Left-sided pacemaker/AICD with lead tip projecting over the expected location of the right ventricular apex. Orthopedic fixation hardware in the lumbar spine incompletely imaged. IMPRESSION: 1. The appearance the chest suggests worsening multilobar bilateral bronchopneumonia, most severe in the right upper lobe. Small  bilateral pleural effusions are now evident, including potentially loculated pleural fluid in the apex of the right hemithorax. 2. Mild cardiomegaly. 3. Aortic atherosclerosis. Electronically Signed   By: Vinnie Langton M.D.   On: 04/08/2021 08:34   EEG adult  Result Date: 04/09/2021 Lora Havens, MD     04/09/2021  4:37 PM Patient Name: Stacy Sanchez MRN: [id number] Epilepsy Attending: Lora Havens Referring Physician/Provider: Dr Kerney Elbe Date:04/09/2021 Duration: 22.40 mins Patient history: 75yo F with an episode of brief delayed responses with R sided weakness. EEG to evaluate for seizure. Level of alertness: lethargic, asleep AEDs during EEG study: None Technical aspects: This EEG study was done with scalp electrodes positioned according to the 10-20 International system of electrode placement. Electrical activity was acquired at a sampling rate of _0  and reviewed with a high frequency filter of _1  and a low frequency filter of _2 . EEG data were recorded continuously and digitally stored. Description: No posterior dominant rhythm was seen. Sleep was characterized by sleep spindles (12 to 14 Hz), maximal frontocentral region. EEG showed continuous generalized 3 to 6 Hz theta-delta slowing, at times with triphasic morphology. Hyperventilation and photic stimulation were not performed.   ABNORMALITY - Continuous slow, generalized IMPRESSION: This study is suggestive of moderate diffuse encephalopathy, nonspecific etiology. No seizures or definite epileptiform discharges were seen throughout the recording. Priyanka Barbra Sarks    Scheduled Meds:  aspirin EC  81 mg Oral Daily   doxycycline  100 mg Oral Q12H   feeding supplement  237 mL Oral BID BM   heparin injection (subcutaneous)  5,000 Units Subcutaneous Q8H   ipratropium  0.5 mg Nebulization Q6H   levalbuterol  0.63 mg Nebulization Q6H   levothyroxine  50 mcg Oral Once per day on Mon Tue Wed Thu Fri   And   levothyroxine  100 mcg  Oral Once per day on Sun Sat   multivitamin with minerals  1 tablet Oral Daily   pantoprazole  20 mg Oral Daily   polyethylene glycol  17 g Oral Daily   rosuvastatin  10 mg Oral Daily   sodium chloride flush  3 mL Intravenous Q12H   sodium zirconium cyclosilicate  10 g Oral Once   [START ON 04/17/2021] thiamine injection  100 mg Intravenous Daily   Continuous Infusions:  sodium chloride 75 mL/hr at 04/09/21 1700   thiamine injection Stopped (04/09/21 1631)   Followed by   Derrill Memo ON 04/11/2021] thiamine injection      LOS: 3 days   Kerney Elbe, DO [facility] PAGER is on AMION  If 7PM-7AM, please contact night-coverage [email or web address]

## 2021-04-09 NOTE — Progress Notes (Signed)
Patient in/out cath, 525 mL's.

## 2021-04-09 NOTE — Consult Note (Signed)
Palliative Care Consult Note  Reason for Consult: Goals of care   Palliative care consult received.  Chart reviewed including personal review of pertinent labs and imaging.  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  Epigastric pain has been going on for a few days and has been waxing and waning.  She was evaluated by GI but no plan for endoscopy at this point due to other comorbidities and general functional decline.  She was also noted to have mental status change and potential choreiform movements and neurology was consulted with recommendation for MRI as well as EEG.  Palliative consulted for goals of care.  I met today with Stacy Sanchez and her daughter, [name].   I introduced palliative care as specialized medical care for people living with serious illness. It focuses on providing relief from the symptoms and stress of a serious illness. The goal is to improve quality of life for both the patient and the family.  [name] tells me that Stacy Sanchez recently moved to independent living at [facility] and has been having continued decline in her nutrition, cognition, functional status for the last year.  She has lost approximately 30 pounds.  We discussed clinical course as well as wishes moving forward in regard to advanced directives.  Concepts specific to code status and rehospitalization discussed.  We discussed difference between a aggressive medical intervention path and a palliative, comfort focused care path.  Values and goals of care important to patient and family were attempted to be elicited.   Concept of Hospice and Palliative Care were discussed   Questions and concerns addressed.   PMT will continue to support holistically.  Plan: - Changed code status to DNR/DNI - No feeding tube. - Family to discuss consideration for deactivation for  ICD.  I recommended deactivation at this point. - Would like to transfer to [facility] for recommended neuro workup before making final decision, but they are open to hospice support. - Likely recommendation at discharge will be back to [facility] with hospice support (assuming ALF can meet her care needs). - Will ask another member of PMT to follow-up and check in either tomorrow or after neuro workup complete  Total time: 80 minutes  Greater than 50%  of this time was spent counseling and coordinating care related to the above assessment and plan.  Micheline Rough, MD [place] [facility] [phone]

## 2021-04-09 NOTE — Progress Notes (Signed)
Neurology Progress Note  S: Patient just moaning and talking to herself (no one is present), heard from hallway. She perseverates on her bottom hurting and then her back. Also, repeats she is so sick and what is going on?  ROS can not be completed due to mental status and ? Appropriate answers to questions.   Neurology was consulted on the 18th by Dr. Ezzie Dural. She had left sided choreiform movements which NP does not see today. Code stroke was called due to sudden delay in responses. CT was negative and patient was not a candidate for TNK due her rapidly resolved symptoms. No LVO on CTA head and neck. Her status was thought to be secondary to metabolic derangements. Her Remeron was stopped due to the choreiform movements . Transfer to [facility] for MRI brain (if pacemaker is compatible) and EEG pending.    O: Current vital signs: BP 128/82 (BP Location: Right Arm)    Pulse 95    Temp 98.6 F (37 C) (Oral)    Resp 16    Ht 5\' 8"  (1.727 m)    Wt 46.7 kg    SpO2 95%    BMI 15.66 kg/m  Vital signs in last 24 hours: Temp:  [98.2 F (36.8 C)-98.7 F (37.1 C)] 98.6 F (37 C) (12/19 0631) Pulse Rate:  [90-95] 95 (12/19 0631) Resp:  [16-20] 16 (12/19 0631) BP: (127-135)/(78-84) 128/82 (12/19 0631) SpO2:  [91 %-98 %] 95 % (12/19 0756) FiO2 (%):  [32 %] 32 % (12/19 0756)  GENERAL: Chronically ill appearing, cachetic female. Awake, alert in NAD.  HEENT: Normocephalic and atraumatic. Oral mucus membranes are dry.  LUNGS: Normal respiratory effort.  CV: RRR on tele.  Ext: warm.  NEURO:  Mental Status: Alert not oriented to any questions. Just replies I don't know to every question. Distracted, perseverating.  Speech/Language: speech is without aphasia or dysarthria.  Will not name objects or repeat phrase.  Cranial Nerves:  II: PERRL. Eyes are slightly dysconjugate.  III, IV, VI: EOMI. Eyelids elevate symmetrically.  V: Sensation is intact to light touch and symmetrical to face.  VII: Smile is symmetrical.   VIII: hearing intact to voice. IX, X: Palate elevates symmetrically. Phonation is normal.  XII: tongue is midline without fasciculations. Motor:  Do not see any left sided choreiform movements.  RUE: grip 4/5. Able to hold right arm up with drift.  LUE: Does not grip on command. Some resistance against gravity.  BLEs: can barely lift off bed. Moves left less than right Sensation- Intact to touch. Touching her results in moaning.     Medications  Current Facility-Administered Medications:    0.9 %  sodium chloride infusion, , Intravenous, Continuous, 08-12-1978 [place], Asprogia, Last Rate: 75 mL/hr at 04/09/21 0520, New Bag at 04/09/21 0520   acetaminophen (TYLENOL) tablet 650 mg, 650 mg, Oral, Q6H PRN **OR** acetaminophen (TYLENOL) suppository 650 mg, 650 mg, Rectal, Q6H PRN, 04/11/21, MD   aspirin EC tablet 81 mg, 81 mg, Oral, Daily, Synetta Fail, MD, 81 mg at 04/08/21 1026   doxycycline (VIBRA-TABS) tablet 100 mg, 100 mg, Oral, Q12H, 04/10/21, MD, 100 mg at 04/09/21 0531   feeding supplement (ENSURE ENLIVE / ENSURE PLUS) liquid 237 mL, 237 mL, Oral, BID BM, 04/11/21, MD, 237 mL at 04/08/21 1459   heparin injection 5,000 Units, 5,000 Units, Subcutaneous, Q8H, Sheikh, Omair Latif, DO   ipratropium (ATROVENT) nebulizer solution 0.5 mg, 0.5 mg, Nebulization, Q6H, 06-17-1997, [place]  Latif, DO, 0.5 mg at 04/09/21 0753   ipratropium-albuterol (DUONEB) 0.5-2.5 (3) MG/3ML nebulizer solution 3 mL, 3 mL, Nebulization, Q4H PRN, Synetta Fail, MD   levalbuterol Pauline Aus) nebulizer solution 0.63 mg, 0.63 mg, Nebulization, Q6H, Sheikh, Omair Latif, DO, 0.63 mg at 04/09/21 8185   levothyroxine (SYNTHROID) tablet 50 mcg, 50 mcg, Oral, Once per day on Mon Tue Wed Thu Fri, 50 mcg at 04/09/21 0531 **AND** levothyroxine (SYNTHROID) tablet 100 mcg, 100 mcg, Oral, Once per day on Sun Sat, Patel, Pranav M, MD, 100 mcg at 04/08/21 6314   melatonin tablet 10 mg, 10 mg, Oral, QHS  PRN, Synetta Fail, MD   multivitamin with minerals tablet 1 tablet, 1 tablet, Oral, Daily, Synetta Fail, MD, 1 tablet at 04/08/21 1026   ondansetron (ZOFRAN) injection 4 mg, 4 mg, Intravenous, Q8H PRN, Blount, Xenia T, NP, 4 mg at 04/07/21 2331   pantoprazole (PROTONIX) EC tablet 20 mg, 20 mg, Oral, Daily, Synetta Fail, MD, 20 mg at 04/08/21 1026   polyethylene glycol (MIRALAX / GLYCOLAX) packet 17 g, 17 g, Oral, Daily PRN, Synetta Fail, MD   polyethylene glycol (MIRALAX / GLYCOLAX) packet 17 g, 17 g, Oral, Daily, Sheikh, Omair Latif, DO, 17 g at 04/08/21 1026   rosuvastatin (CRESTOR) tablet 10 mg, 10 mg, Oral, Daily, Beola Cord B, MD, 10 mg at 04/08/21 1027   sodium chloride flush (NS) 0.9 % injection 3 mL, 3 mL, Intravenous, Q12H, Synetta Fail, MD, 3 mL at 04/08/21 2244   sodium zirconium cyclosilicate (LOKELMA) packet 10 g, 10 g, Oral, Once, Sheikh, Omair Latif, DO  Pertinent Labs K 5.2, Mg 2.3, creat 2.15 (2.58). TSH 7.285  Vit 12 561  Imaging MRI brain pending.  EEG pending.   Assessment: 75 yo female who had acute delayed responses yesterday and code stroke was negative. To assess for seizures, EEG has been ordered. MRI brain is pending. We are still attempting to get MRI brain to r/p basal ganglia stroke. However, patient has a lot of metabolic derangements, including TSH, hypokalemia, and acute kidney failure which could contribute to her presentation, making delirium a possibility. With her   Recommendations/Plan:  -continue to correct metabolic derangements.  -Delirium precautions.  Promote sleep hygiene Mobilize patient early Make sure the patient has a hearing aid and glasses Manage pain adequately Maintain good hydration and nutrition Monitor bowel and bladder function Try to detect delirium early Optimize the environment Avoid any type of stress Communicate with the patient -Avoid medications on the [facility]'s list for the elderly.   -Pending transfer to [facility].  -High-dose thiamine  Pt seen by Jimmye Norman, MSN, APN-BC/Nurse Practitioner/Neuro and later by MD. Note and plan to be edited as needed by MD.  Pager: [phone]  I have seen the patient and reviewed the above note.  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  She does have slight disc conjugation of her days at times, and I worry about Wernicke's encephalopathy in someone with as much weight loss that she has had recently.  She has been started on high-dose thiamine.  She will need MRI brain, currently awaiting transfer to [facility] because she has a pacemaker.  Given the delay in transfer, I will order a CT head to repeat this.  She is also due for an EEG today.  Neurology will continue to follow  Ritta Slot, MD [facility]  [phone]  If 7pm- 7am, please page neurology on call as listed in AMION.

## 2021-04-09 NOTE — Progress Notes (Signed)
EEG complete - results pending 

## 2021-04-09 NOTE — Progress Notes (Addendum)
Patient transported to [facility] via [facility]. Report called to Trudi Ida, RN. Daughter notified and at bedside.

## 2021-04-09 NOTE — Evaluation (Signed)
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[place])    Pseudomonas respiratory infection    Thyroid disease    Past Surgical History:  Past Surgical History:  Procedure Laterality Date   ABDOMINAL HYSTERECTOMY     BRONCHOSCOPY  2001   CARDIAC SURGERY     COLONOSCOPY  2008   CYST REMOVAL TRUNK  1965   Left axilla; benign   DILATION AND CURETTAGE OF UTERUS  1975   HAND SURGERY Right 1983   ICD IMPLANT Left 02/01/2020   PATENT DUCTUS ARTERIOUS REPAIR  1952   RIGHT HEART CATH N/A 03/23/2020   Procedure: RIGHT HEART CATH;  Surgeon: Jolaine Artist, MD;  Location: [facility];  Service: Cardiovascular;  Laterality: N/A;   TONSILLECTOMY  1956   VARICOSE VEIN SURGERY  1972   HPI:  Patient is a 75 y.o. female with PMH: bronchiectasis, CAD, CKD 3, hypertension, hypothyroidism, neuropathy, cytopenia, CHF, MAC presenting with ongoing abdominal pain. She has a chronic cough, has had decreased appetite and PO intake at home with family reporting 30lb weight loss in past year or so. In ED, vital signs stable, CXR showing stable prominent interstitial markings and unable to exclude airspace disease. She had OP MBS on 03/20/21: unremarkable oropharyngeal swallow.  There was perhaps mildly prolonged mastication/bolus cohesion of solids, potentially attributable to xerostomia.  There was mild vallecular residue throughout the study, regardless of consistency, suggesting decreased base of tongue/pharyngeal wall contact. She also had an Esophagram on 03/20/21: Moderate esophageal  dysmotility wioth poor initiation of stripping wave but no stricture or mass in esophagus.    Assessment / Plan / Recommendation  Clinical Impression  Patient presents with clinical s/s of dysphagia as per this bedside swallow evaluation. As per results of recent MBS (11/29) and patient's current state of confusion, suspect that dysphagia is largely cognitive based but with some impact from h/o xerostomia as well as current state of having congested cough, productive at times. When patient did expectorate phlegm, it was sticky,thick and yellow-greenish in color. When she swallowed sips of water, she swallowed multiple times and likely has thick sticky secretions in oropharynx. Coughing and throat clearing all appeared related to secretion management rather than aspiration or penetration. SLP discussed assessment with daughter, recommended she focus on patient taking sips of water when she is alert to help thin down mucous/phlegm and to try to avoid dairy products and other such mucous-producing PO's. SLP Visit Diagnosis: Dysphagia, unspecified (R13.10)    Aspiration Risk  No limitations;Mild aspiration risk    Diet Recommendation     Liquid Administration via: Cup;No straw Medication Administration: Crushed with puree Supervision: Full supervision/cueing for compensatory strategies;Staff to assist with self feeding Compensations: Minimize environmental distractions;Slow rate;Small sips/bites Postural Changes: Seated upright at 90 degrees    Other  Recommendations Oral Care Recommendations: Oral care BID;Staff/trained caregiver to provide oral care    Recommendations for follow up therapy are one component of a multi-disciplinary discharge planning process, led by the attending physician.  Recommendations may be updated based on patient status, additional functional criteria and  insurance authorization.  Follow up Recommendations Other (comment) (tBD)      Assistance Recommended at Discharge  Frequent or constant Supervision/Assistance  Functional Status Assessment Patient has had a recent decline in their functional status and demonstrates the ability to make significant improvements in function in a reasonable and predictable amount of time.  Frequency and Duration min 2x/week  1 week       Prognosis Prognosis for Safe Diet Advancement: Good      Swallow Study   General Date of Onset: 04/07/2021 HPI: Patient is a 75 y.o. female with PMH: bronchiectasis, CAD, CKD 3, hypertension, hypothyroidism, neuropathy, cytopenia, CHF, MAC presenting with ongoing abdominal pain. She has a chronic cough, has had decreased appetite and PO intake at home with family reporting 30lb weight loss in past year or so. In ED, vital signs stable, CXR showing stable prominent interstitial markings and unable to exclude airspace disease. She had OP MBS on 03/20/21: unremarkable oropharyngeal swallow.  There was perhaps mildly prolonged mastication/bolus cohesion of solids, potentially attributable to xerostomia.  There was mild vallecular residue throughout the study, regardless of consistency, suggesting decreased base of tongue/pharyngeal wall contact. She also had an Esophagram on 03/20/21: Moderate esophageal dysmotility wioth poor initiation of stripping wave but no stricture or mass in esophagus. Type of Study: Bedside Swallow Evaluation Previous Swallow Assessment: MBS as outpatient 11/29 Diet Prior to this Study: Thin liquids;Regular Temperature Spikes Noted: No Respiratory Status: Nasal cannula History of Recent Intubation: No Behavior/Cognition: Alert;Cooperative;Pleasant mood;Confused Oral Cavity Assessment: Dry Oral Care Completed by SLP: Yes Oral Cavity - Dentition: Adequate natural dentition Self-Feeding Abilities: Needs assist;Needs set up Patient Positioning: Upright in bed Baseline Vocal Quality: Normal Volitional Cough: Strong Volitional Swallow: Unable to elicit     Oral/Motor/Sensory Function Overall Oral Motor/Sensory Function: Within functional limits   Ice Chips     Thin Liquid Thin Liquid: Impaired Presentation: Cup Oral Phase Impairments: Poor awareness of bolus;Reduced labial seal Pharyngeal  Phase Impairments: Suspected delayed Swallow    Nectar Thick     Honey Thick     Puree Puree: Not tested   Solid     Solid: Not tested      Sonia Baller, MA, CCC-SLP Speech Therapy

## 2021-04-09 NOTE — Progress Notes (Signed)
Pt assessed not to have void during shift. NT asked to bladder scan and reported off to oncoming RN. Dionne Bucy RN

## 2021-04-09 NOTE — Progress Notes (Signed)
Pt has been unable to tolerated CPAP qhs for the last couple of nights.  RT will hold at this time.

## 2021-04-09 NOTE — Progress Notes (Signed)
Pharmacy notified of Lokelma not available in pt bin and to resend  medication. Per pharmacy, Medication is out from pharmacy and unavailable. MD notified, no new orders received. Will continue to closely monitor pt. Dionne Bucy RN

## 2021-04-09 NOTE — Progress Notes (Signed)
Lokelma not given, not available in pharmacy. On-call NP notified. No new orders at this time.

## 2021-04-09 NOTE — Procedures (Signed)
Patient Name: Stacy Sanchez  MRN: [id number]  Epilepsy Attending: Charlsie Quest  Referring Physician/Provider: Dr Caryl Pina Date:04/09/2021 Duration: 22.40 mins  Patient history: 75yo F with an episode of brief delayed responses with R sided weakness. EEG to evaluate for seizure.  Level of alertness: lethargic, asleep  AEDs during EEG study: None  Technical aspects: This EEG study was done with scalp electrodes positioned according to the 10-20 International system of electrode placement. Electrical activity was acquired at a sampling rate of 500Hz  and reviewed with a high frequency filter of 70Hz  and a low frequency filter of 1Hz . EEG data were recorded continuously and digitally stored.   Description: No posterior dominant rhythm was seen. Sleep was characterized by sleep spindles (12 to 14 Hz), maximal frontocentral region. EEG showed continuous generalized 3 to 6 Hz theta-delta slowing, at times with triphasic morphology. Hyperventilation and photic stimulation were not performed.     ABNORMALITY - Continuous slow, generalized  IMPRESSION: This study is suggestive of moderate diffuse encephalopathy, nonspecific etiology. No seizures or definite epileptiform discharges were seen throughout the recording.  Katira Dumais 

## 2021-04-10 ENCOUNTER — Inpatient Hospital Stay (HOSPITAL_COMMUNITY): Payer: Medicare HMO

## 2021-04-10 DIAGNOSIS — Z515 Encounter for palliative care: Secondary | ICD-10-CM

## 2021-04-10 DIAGNOSIS — G9340 Encephalopathy, unspecified: Secondary | ICD-10-CM

## 2021-04-10 DIAGNOSIS — R4701 Aphasia: Secondary | ICD-10-CM

## 2021-04-10 LAB — COMPREHENSIVE METABOLIC PANEL
ALT: 16 U/L (ref 0–44)
AST: 19 U/L (ref 15–41)
Albumin: 1.9 g/dL — ABNORMAL LOW (ref 3.5–5.0)
Alkaline Phosphatase: 66 U/L (ref 38–126)
Anion gap: 5 (ref 5–15)
BUN: 58 mg/dL — ABNORMAL HIGH (ref 8–23)
CO2: 31 mmol/L (ref 22–32)
Calcium: 8.7 mg/dL — ABNORMAL LOW (ref 8.9–10.3)
Chloride: 103 mmol/L (ref 98–111)
Creatinine, Ser: 1.95 mg/dL — ABNORMAL HIGH (ref 0.44–1.00)
GFR, Estimated: 26 mL/min — ABNORMAL LOW (ref >60)
Glucose, Bld: 87 mg/dL (ref 70–99)
Potassium: 5.2 mmol/L — ABNORMAL HIGH (ref 3.5–5.1)
Sodium: 139 mmol/L (ref 135–145)
Total Bilirubin: 0.7 mg/dL (ref 0.3–1.2)
Total Protein: 5.7 g/dL — ABNORMAL LOW (ref 6.5–8.1)

## 2021-04-10 LAB — CBC WITH DIFFERENTIAL/PLATELET
Abs Immature Granulocytes: 0.05 10*3/uL (ref 0.00–0.07)
Basophils Absolute: 0.1 10*3/uL (ref 0.0–0.1)
Basophils Relative: 1 %
Eosinophils Absolute: 0.1 10*3/uL (ref 0.0–0.5)
Eosinophils Relative: 1 %
HCT: 38.3 % (ref 36.0–46.0)
Hemoglobin: 10.8 g/dL — ABNORMAL LOW (ref 12.0–15.0)
Immature Granulocytes: 1 %
Lymphocytes Relative: 16 %
Lymphs Abs: 1.3 10*3/uL (ref 0.7–4.0)
MCH: 29.8 pg (ref 26.0–34.0)
MCHC: 28.2 g/dL — ABNORMAL LOW (ref 30.0–36.0)
MCV: 105.5 fL — ABNORMAL HIGH (ref 80.0–100.0)
Monocytes Absolute: 0.7 10*3/uL (ref 0.1–1.0)
Monocytes Relative: 8 %
Neutro Abs: 6 10*3/uL (ref 1.7–7.7)
Neutrophils Relative %: 73 %
Platelets: 220 10*3/uL (ref 150–400)
RBC: 3.63 MIL/uL — ABNORMAL LOW (ref 3.87–5.11)
RDW: 15.9 % — ABNORMAL HIGH (ref 11.5–15.5)
WBC: 8.1 10*3/uL (ref 4.0–10.5)
nRBC: 0.2 % (ref 0.0–0.2)

## 2021-04-10 LAB — MAGNESIUM: Magnesium: 2 mg/dL (ref 1.7–2.4)

## 2021-04-10 LAB — LACTATE DEHYDROGENASE: LDH: 126 U/L (ref 98–192)

## 2021-04-10 IMAGING — MR MR MRA NECK W/O CM
1 series · 19 of 48 positions shown · non-contrast
Comparison: CT head [DATE]

CLINICAL DATA: Acute neuro deficit.  Stroke.



[Series 6: ax (id) · axial · 2.8mm · 0.47mm/px · z∈[-217,-33]mm · 19 of 140 slices shown]
[im 1/140]
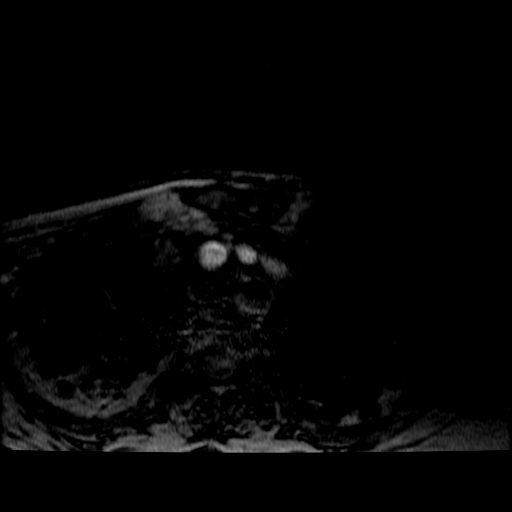
[im 3/140]
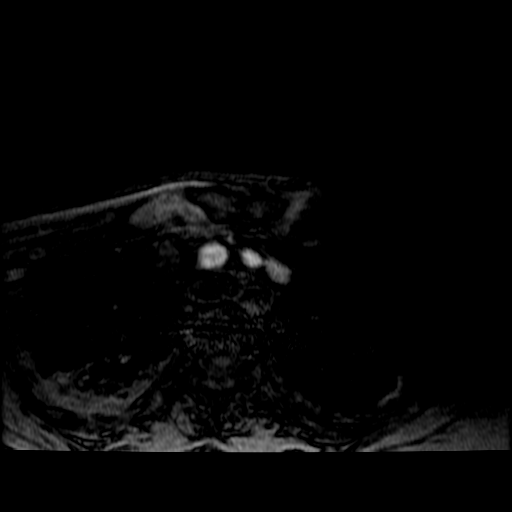
[im 6/140]
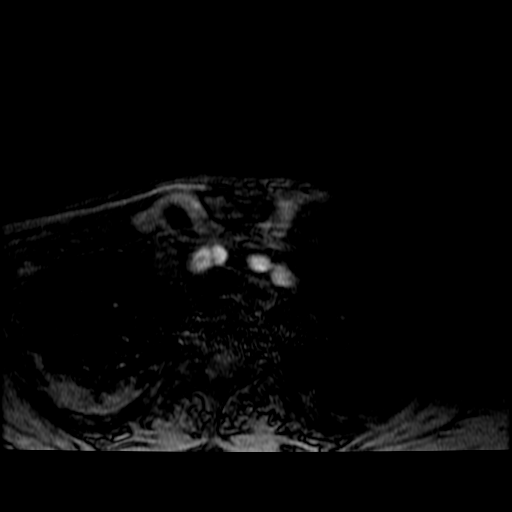
[im 9/140]
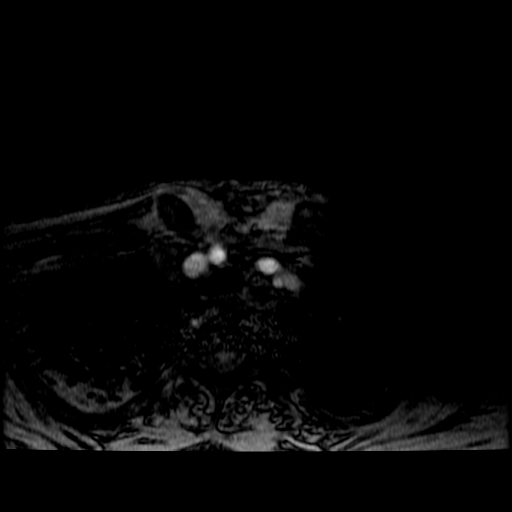
[im 12/140]
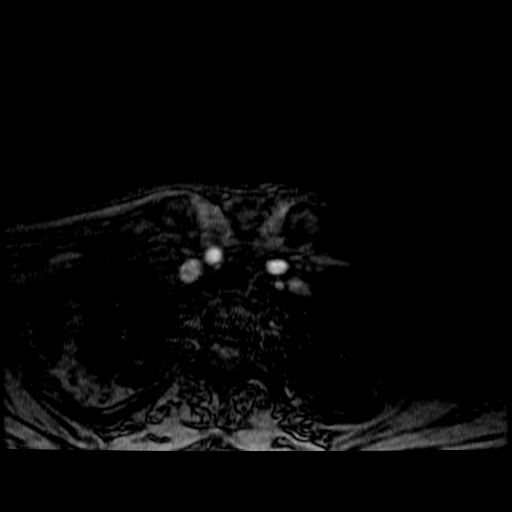
[im 15/140]
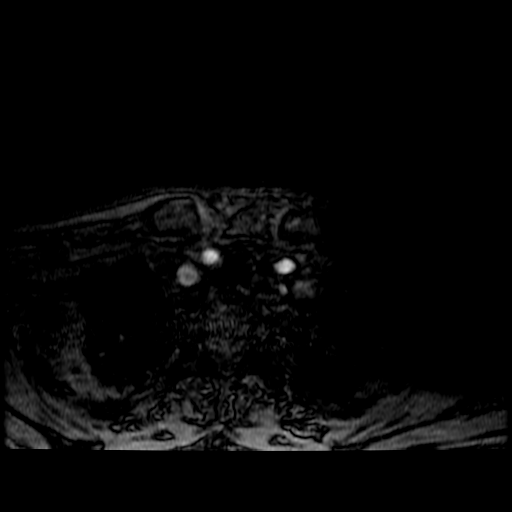
[im 18/140]
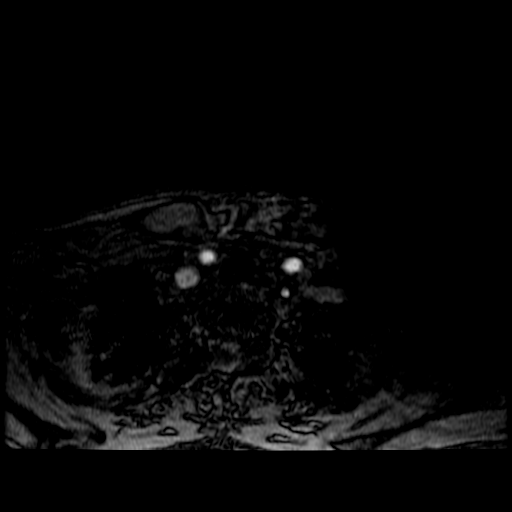
[im 21/140]
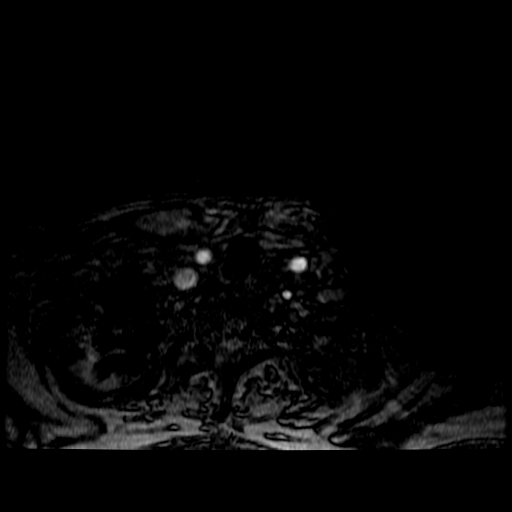
[im 24/140]
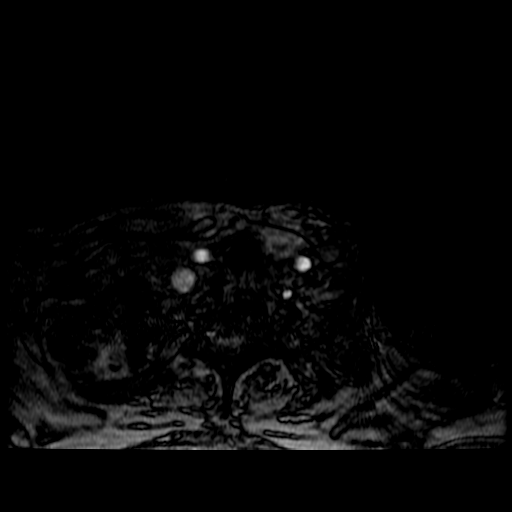
[im 27/140]
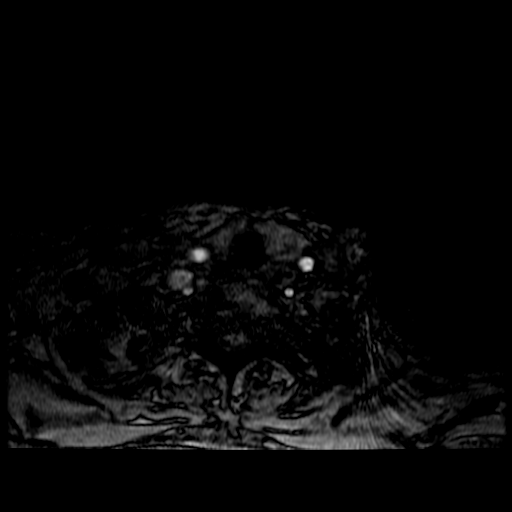
[im 30/140]
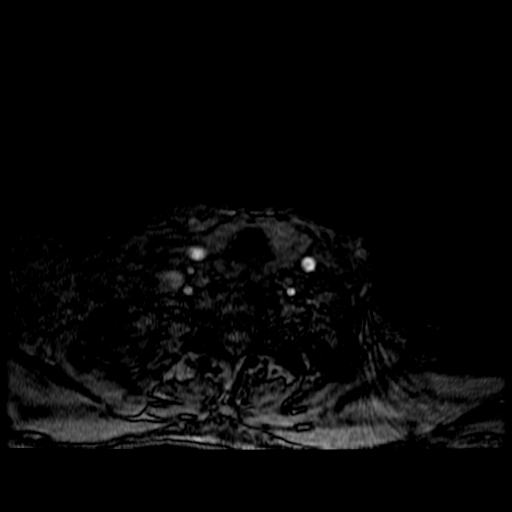
[im 45/140]
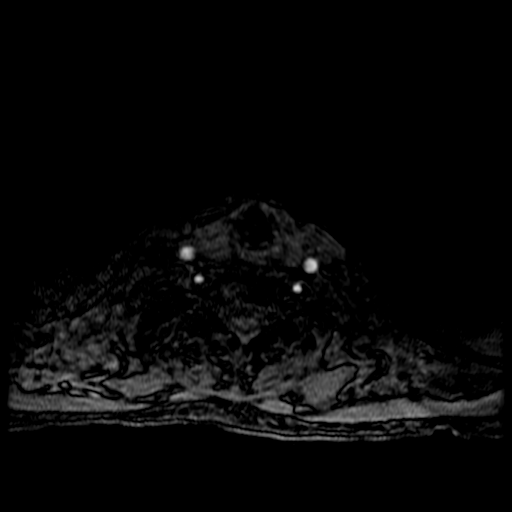
[im 63/140]
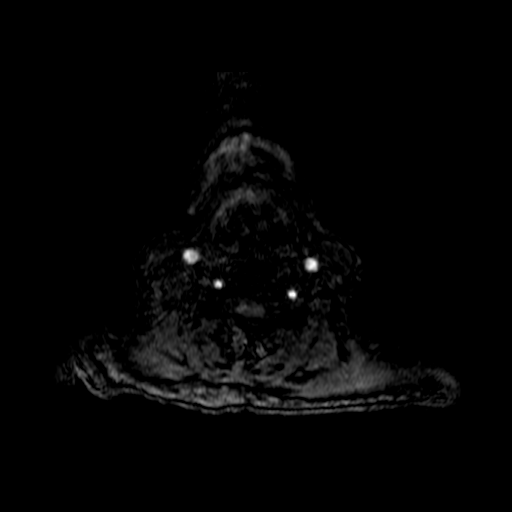
[im 71/140]
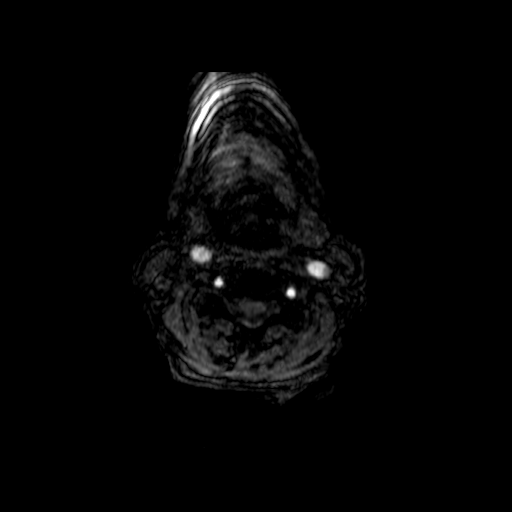
[im 80/140]
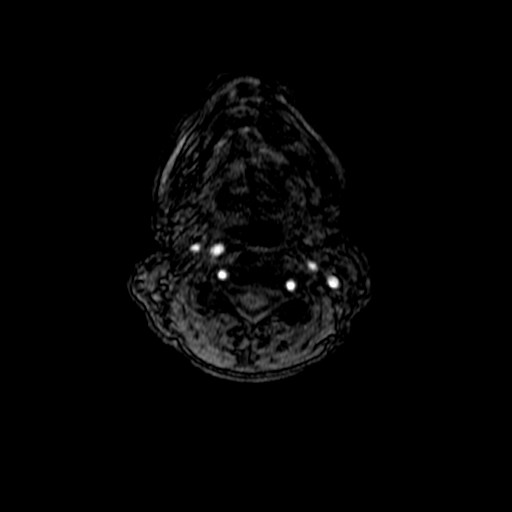
[im 98/140]
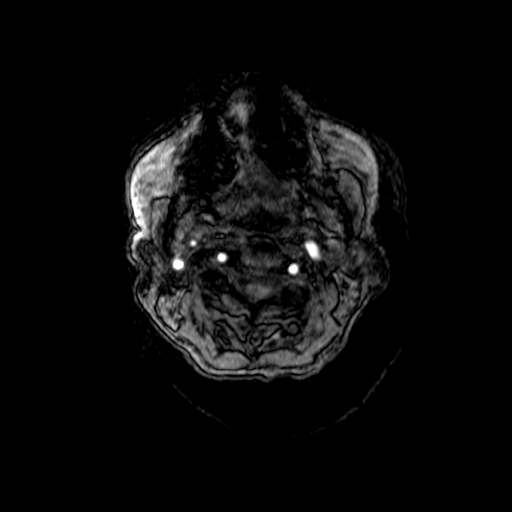
[im 116/140]
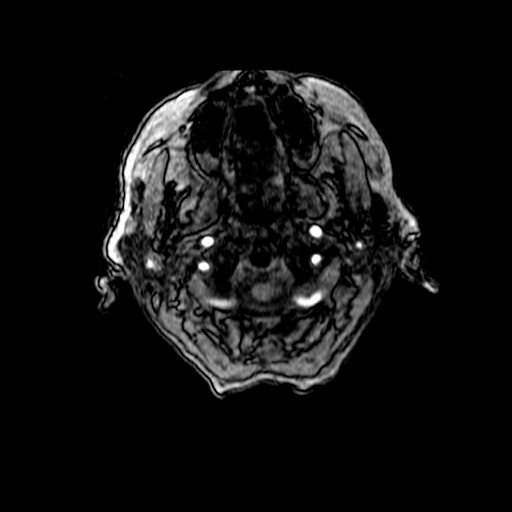
[im 119/140]
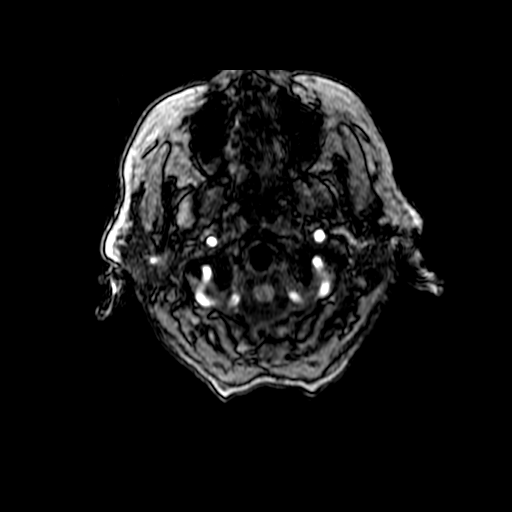
[im 134/140]
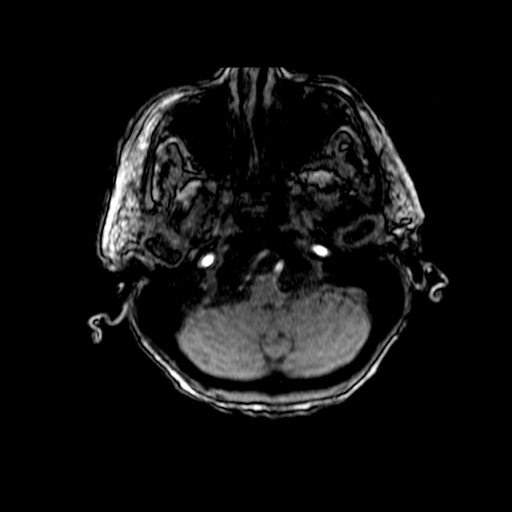

[19 of 48 positions shown; findings below may reference images not displayed]

FINDINGS: MRI HEAD FINDINGS

Brain: Multiple small areas of acute infarct are present in both
cerebral hemispheres involving both frontal lobes and the left
parietal lobe. Small acute infarcts in the cerebellum bilaterally.
Brainstem intact.

Negative for hemorrhage or mass. Cystic change in the choroid plexus
bilaterally compatible with incidental xanthogranulomatous.
Ventricle size normal. Mild chronic microvascular ischemic change in
the white matter and pons.

Vascular: Normal arterial flow voids

Skull and upper cervical spine: No focal skeletal lesion.

Sinuses/Orbits: Paranasal sinuses clear. Bilateral cataract
extraction

Other: None

MRA HEAD FINDINGS

Internal carotid artery widely patent bilaterally. Anterior and
middle cerebral arteries widely patent

Normal posterior circulation. No stenosis or large vessel occlusion.
Negative for aneurysm.

MRA NECK FINDINGS

Antegrade flow in the carotid and vertebral arteries bilaterally. No
stenosis or occlusion identified.
IMPRESSION: 1. Multiple small areas of acute infarct in the cerebral hemispheres
bilaterally and in the cerebellum bilaterally. Probable emboli.
2. Negative MRA head
3. Negative MRA neck
4. No source of emboli identified in the head neck.

## 2021-04-10 IMAGING — MR MR HEAD W/O CM
9 of 10 series · 35 of 48 positions shown · non-contrast
Comparison: CT head [DATE]

CLINICAL DATA: Acute neuro deficit.  Stroke.



[Series 8: DWI · axial · 3.0mm · 1.09mm/px · z∈[-37,+113]mm · 8 of 101 slices shown (1 of 4)]
[im 1/101]
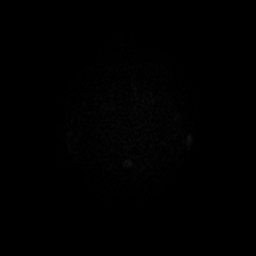
[im 12/101]
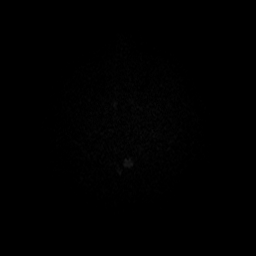
[im 34/101]
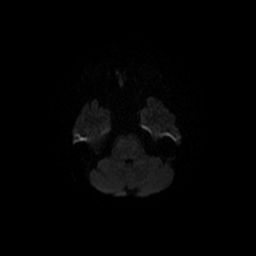
[im 45/101]
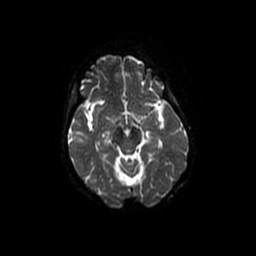
[im 56/101]
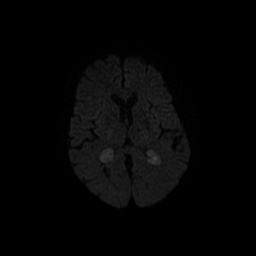
[im 67/101]
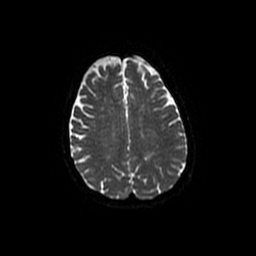
[im 89/101]
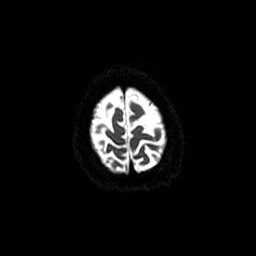
[im 101/101]
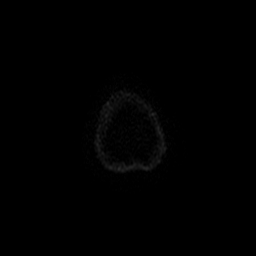

[Series 9: DWI · coronal · 5.0mm · 1.09mm/px · 7 of 76 slices shown (2 of 4)]
[im 1/76]
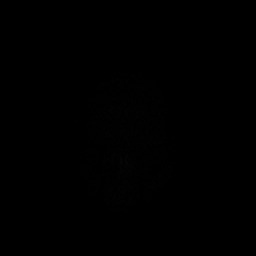
[im 13/76]
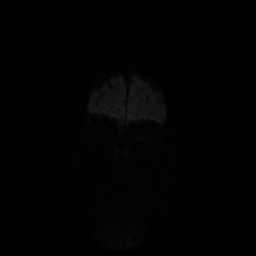
[im 26/76]
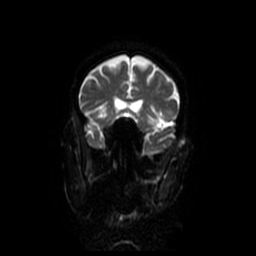
[im 38/76]
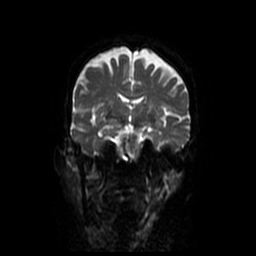
[im 51/76]
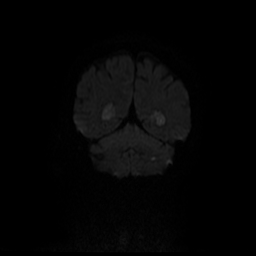
[im 63/76]
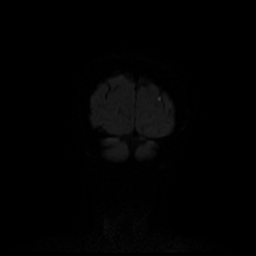
[im 76/76]
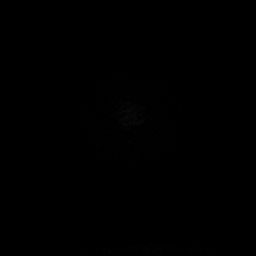

[Series 10: T1 · sagittal · 5.0mm · 0.47mm/px · 2 of 23 slices shown (1 of 2)]
[im 1/23]
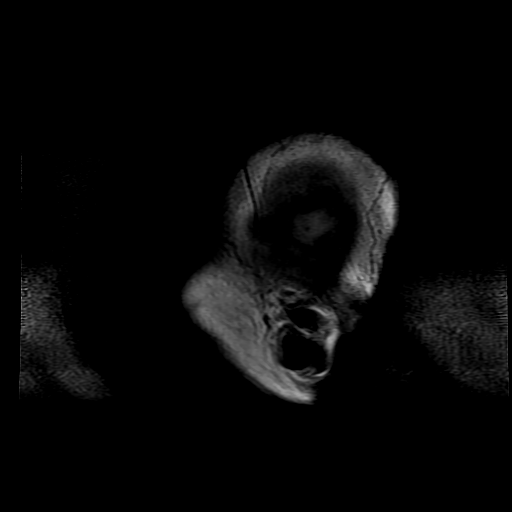
[im 23/23]
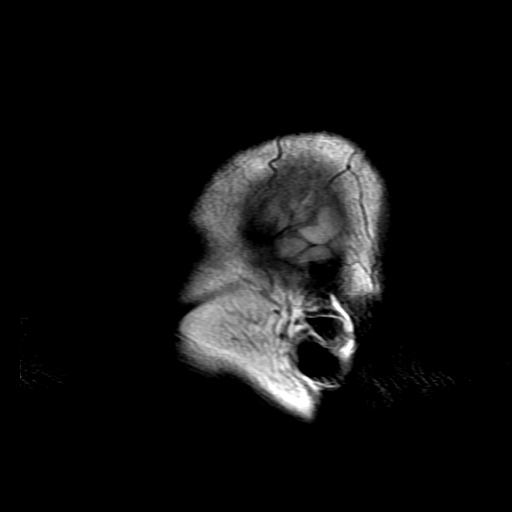

[Series 11: T2 · axial · 5.0mm · 0.43mm/px · z∈[-60,+90]mm · 3 of 26 slices shown (1 of 2)]
[im 1/26]
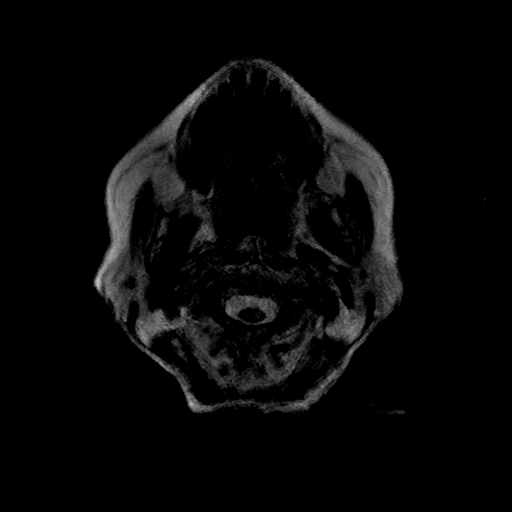
[im 13/26]
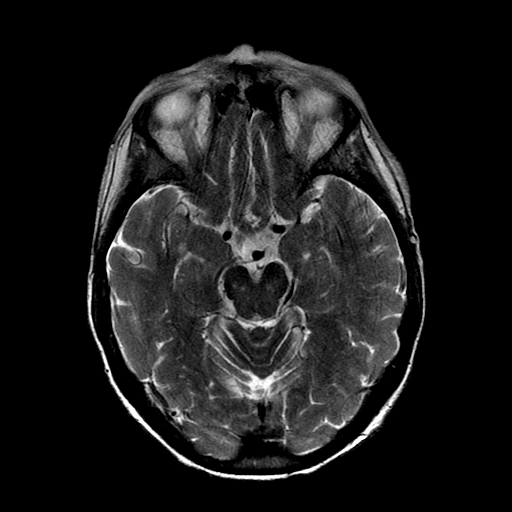
[im 26/26]
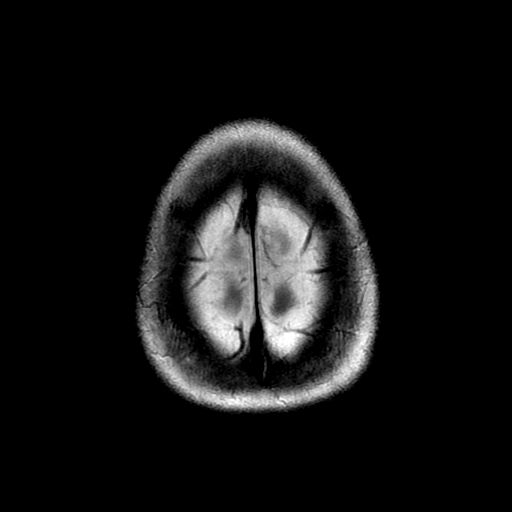

[Series 12: FLAIR · axial · 3.0mm · 0.43mm/px · z∈[-60,+90]mm · 3 of 26 slices shown]
[im 1/26]
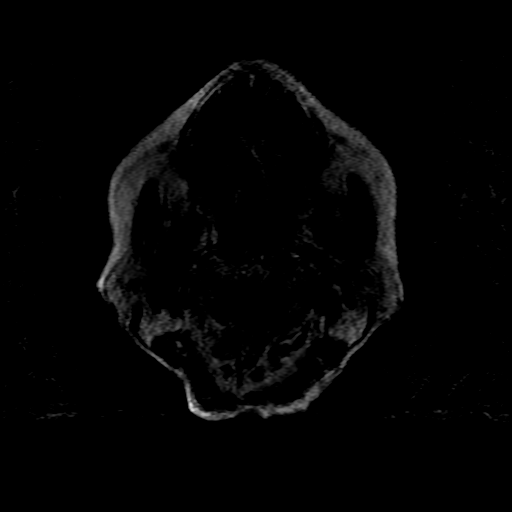
[im 13/26]
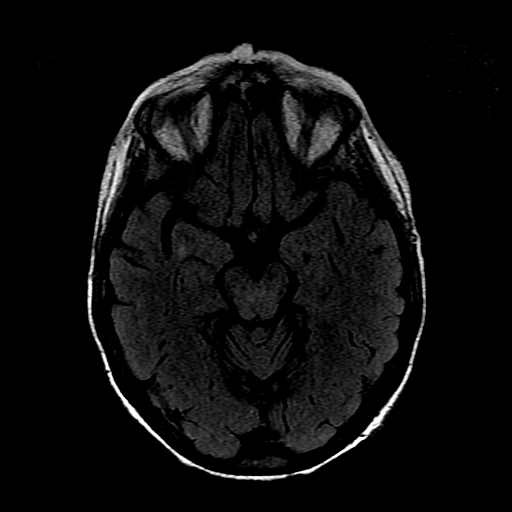
[im 26/26]
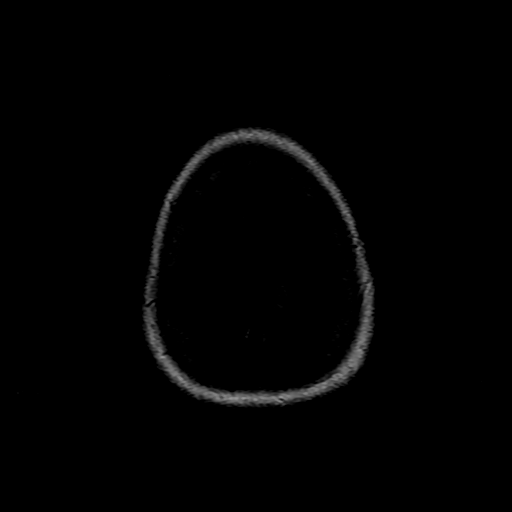

[Series 14: T1 · axial · 3.0mm · 0.43mm/px · 1 of 104 slices shown (2 of 2)]
[im 1/104]
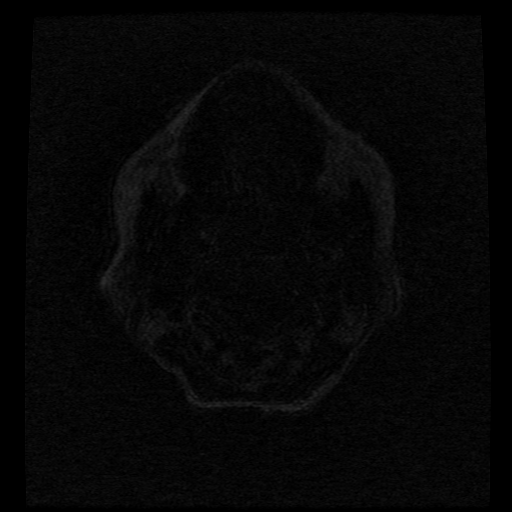

[Series 15: T2 · coronal · 5.0mm · 0.39mm/px · 2 of 24 slices shown (2 of 2)]
[im 1/24]
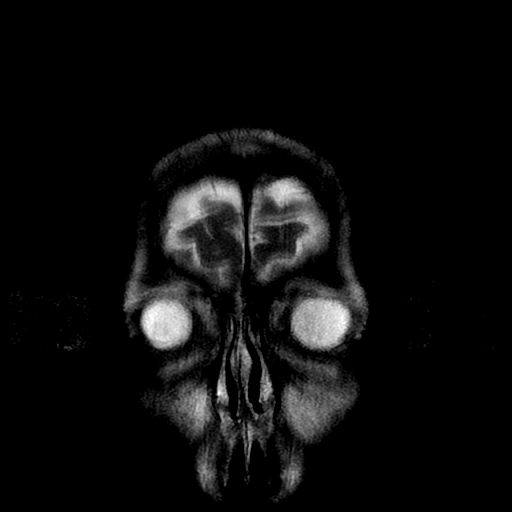
[im 24/24]
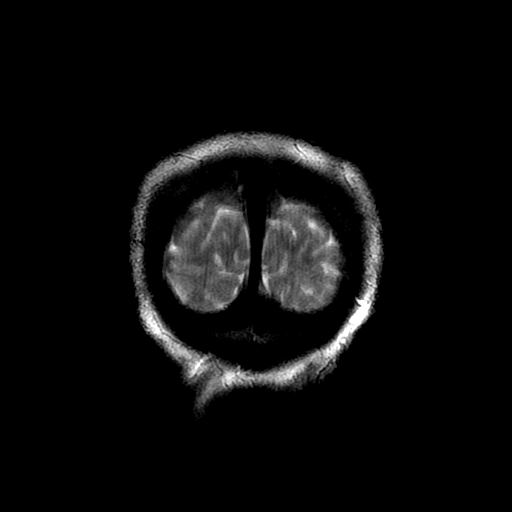

[Series 800: DWI · axial · 3.0mm · 1.09mm/px · z∈[-37,+113]mm · 5 of 51 slices shown (3 of 4)]
[im 1/51]
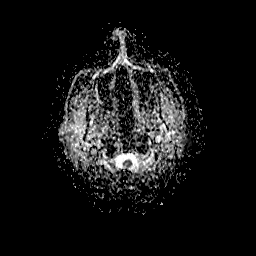
[im 13/51]
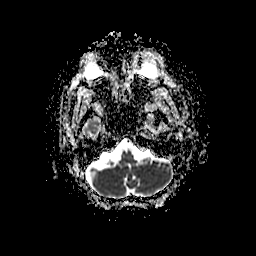
[im 26/51]
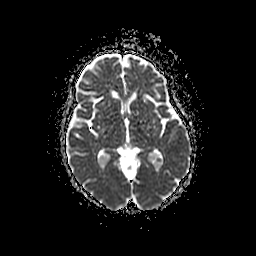
[im 38/51]
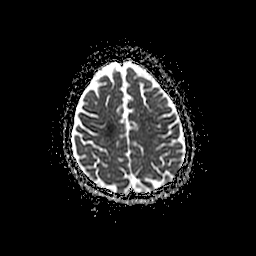
[im 51/51]
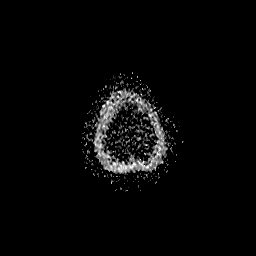

[Series 900: DWI · coronal · 5.0mm · 1.09mm/px · 4 of 38 slices shown (4 of 4)]
[im 1/38]
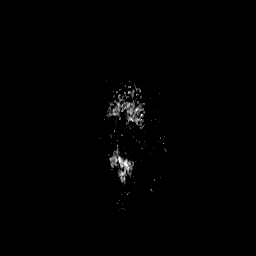
[im 13/38]
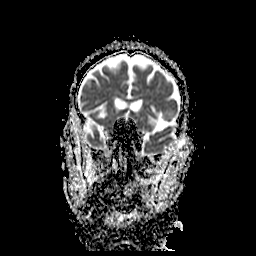
[im 25/38]
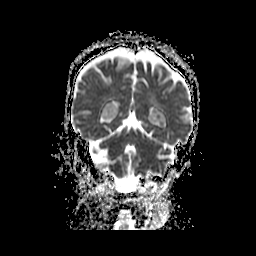
[im 38/38]
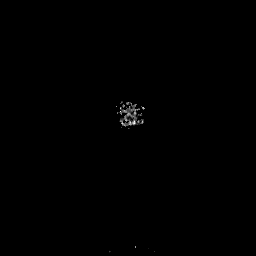

[35 of 48 positions shown; findings below may reference images not displayed]

FINDINGS: MRI HEAD FINDINGS

Brain: Multiple small areas of acute infarct are present in both
cerebral hemispheres involving both frontal lobes and the left
parietal lobe. Small acute infarcts in the cerebellum bilaterally.
Brainstem intact.

Negative for hemorrhage or mass. Cystic change in the choroid plexus
bilaterally compatible with incidental xanthogranulomatous.
Ventricle size normal. Mild chronic microvascular ischemic change in
the white matter and pons.

Vascular: Normal arterial flow voids

Skull and upper cervical spine: No focal skeletal lesion.

Sinuses/Orbits: Paranasal sinuses clear. Bilateral cataract
extraction

Other: None

MRA HEAD FINDINGS

Internal carotid artery widely patent bilaterally. Anterior and
middle cerebral arteries widely patent

Normal posterior circulation. No stenosis or large vessel occlusion.
Negative for aneurysm.

MRA NECK FINDINGS

Antegrade flow in the carotid and vertebral arteries bilaterally. No
stenosis or occlusion identified.
IMPRESSION: 1. Multiple small areas of acute infarct in the cerebral hemispheres
bilaterally and in the cerebellum bilaterally. Probable emboli.
2. Negative MRA head
3. Negative MRA neck
4. No source of emboli identified in the head neck.

## 2021-04-10 IMAGING — MR MR MRA HEAD W/O CM
1 series · 19 of 48 positions shown · non-contrast
Comparison: CT head [DATE]

CLINICAL DATA: Acute neuro deficit.  Stroke.



[Series 3: (id) mt fs · axial · 1.4mm · 0.43mm/px · z∈[-55,+34]mm · 19 of 136 slices shown]
[im 1/136]
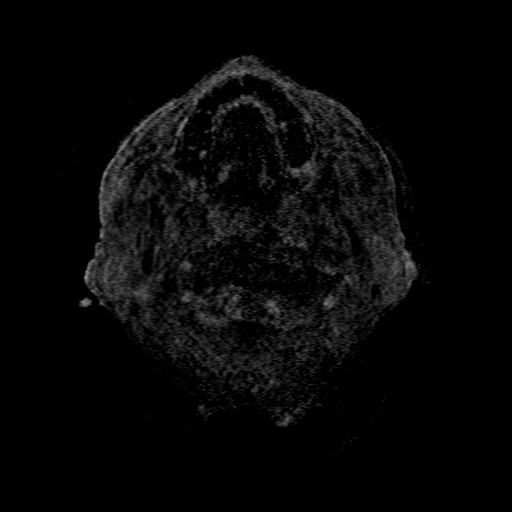
[im 3/136]
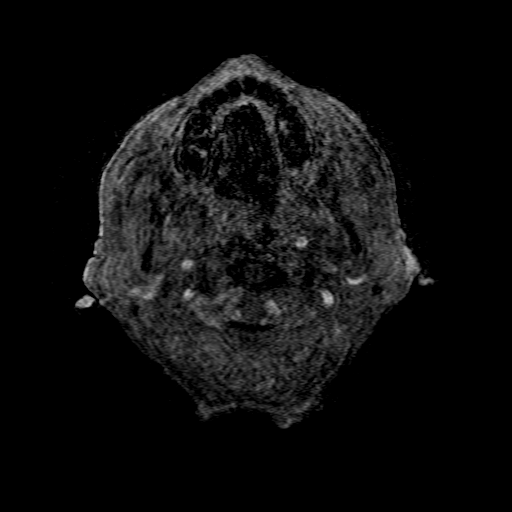
[im 6/136]
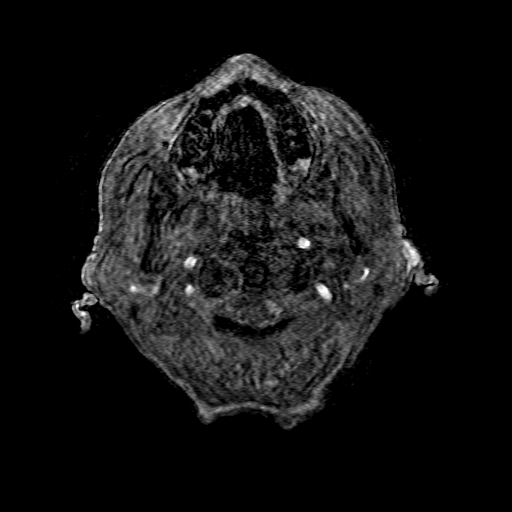
[im 9/136]
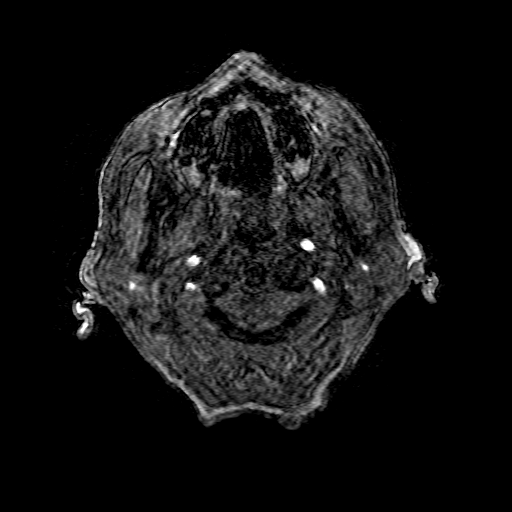
[im 12/136]
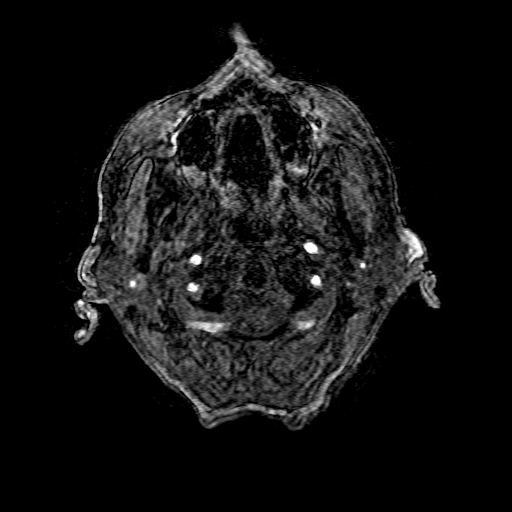
[im 15/136]
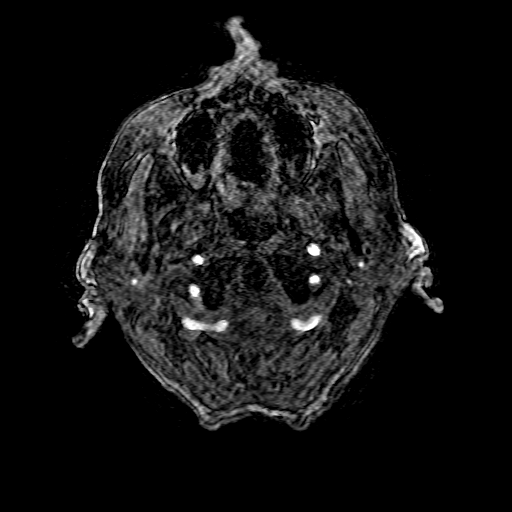
[im 18/136]
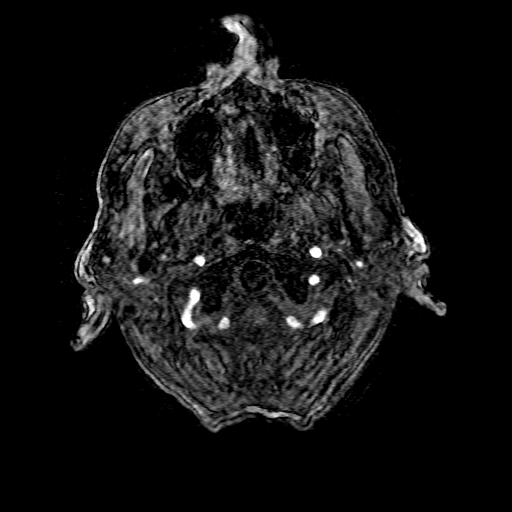
[im 21/136]
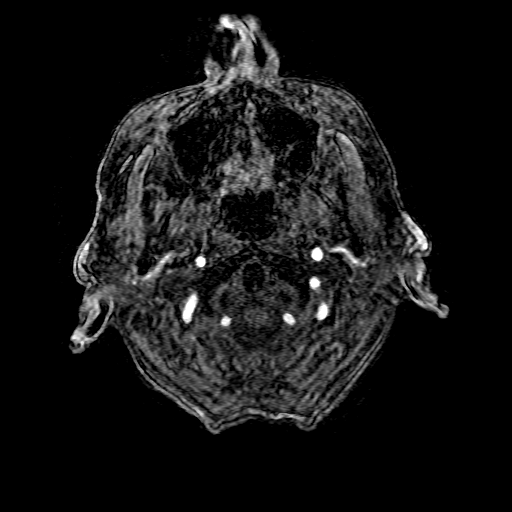
[im 23/136]
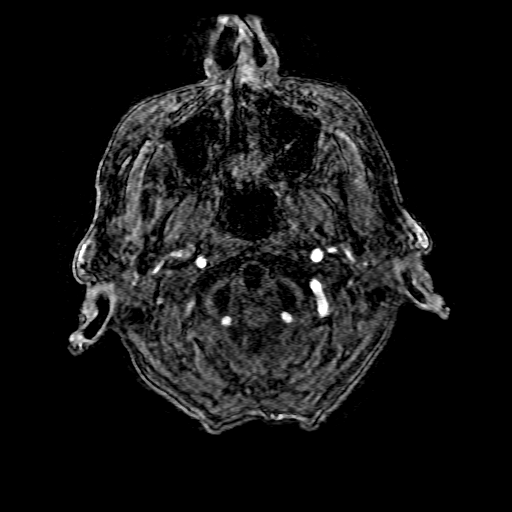
[im 26/136]
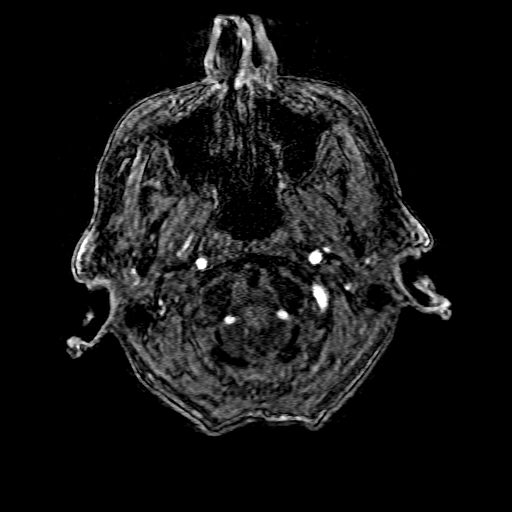
[im 29/136]
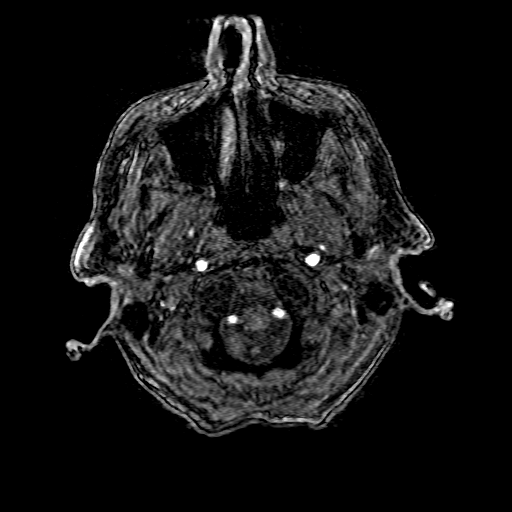
[im 44/136]
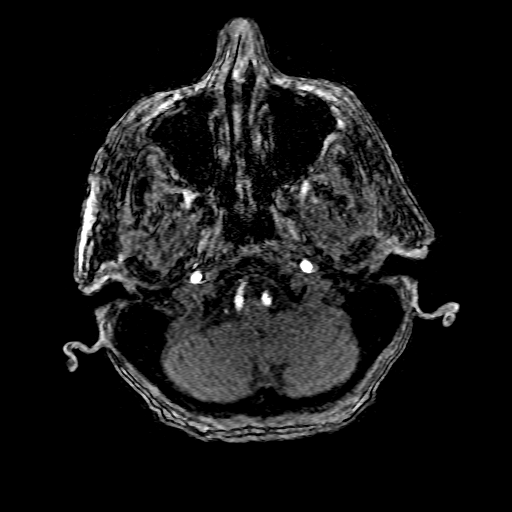
[im 61/136]
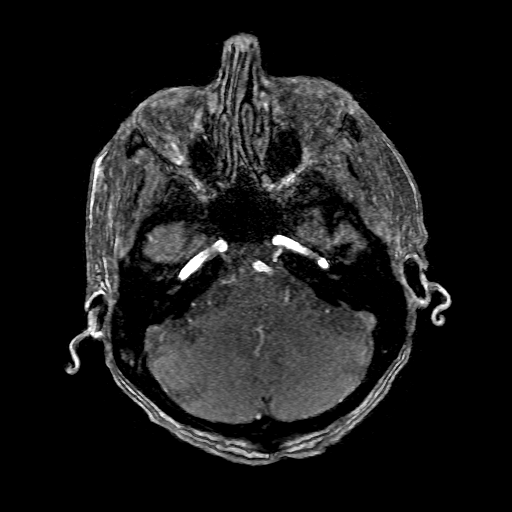
[im 69/136]
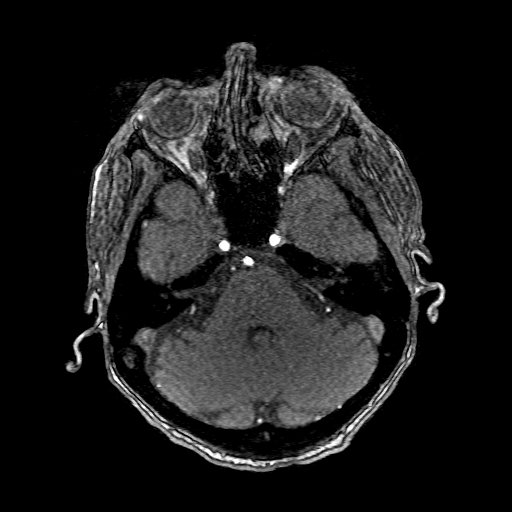
[im 78/136]
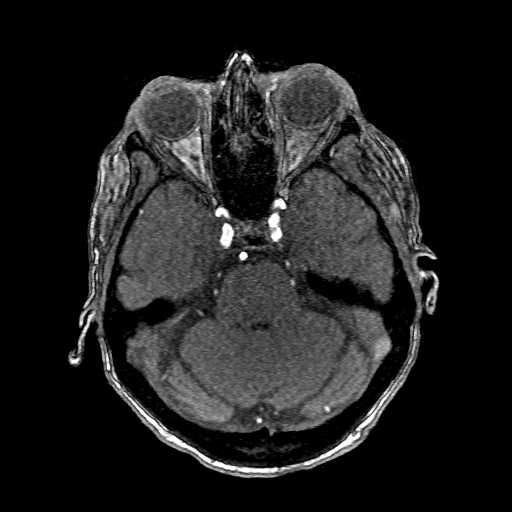
[im 95/136]
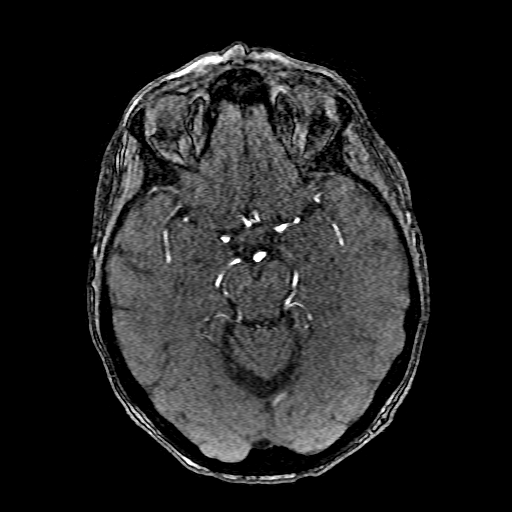
[im 113/136]
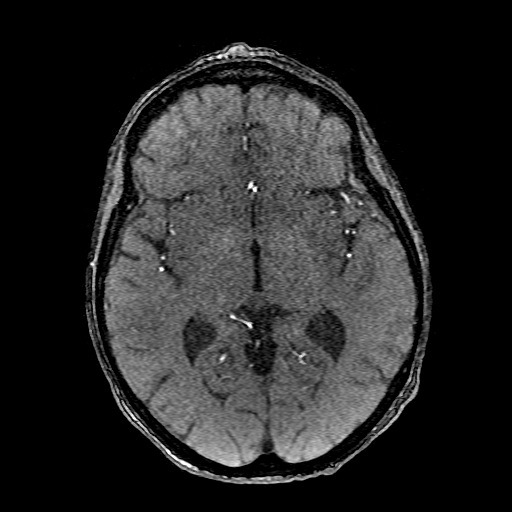
[im 115/136]
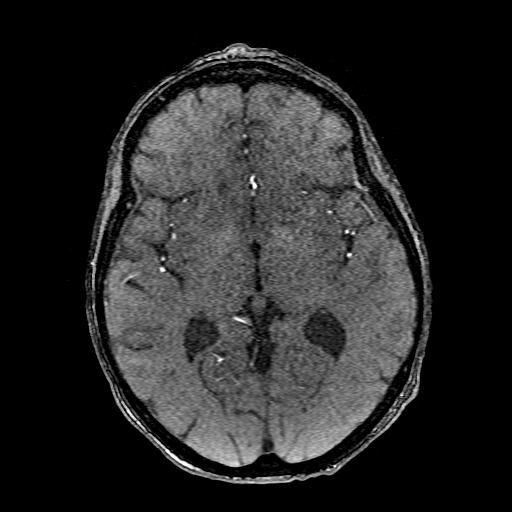
[im 130/136]
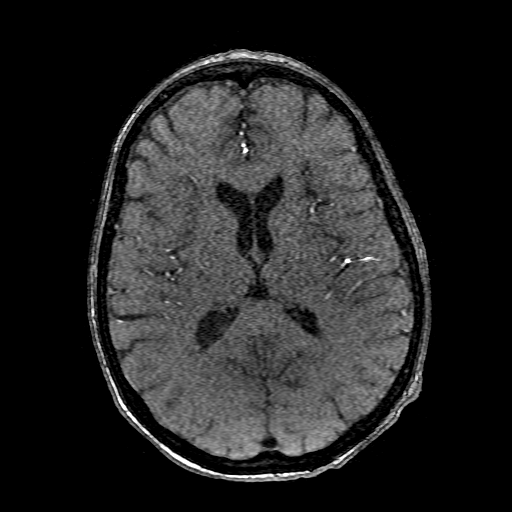

[19 of 48 positions shown; findings below may reference images not displayed]

FINDINGS: MRI HEAD FINDINGS

Brain: Multiple small areas of acute infarct are present in both
cerebral hemispheres involving both frontal lobes and the left
parietal lobe. Small acute infarcts in the cerebellum bilaterally.
Brainstem intact.

Negative for hemorrhage or mass. Cystic change in the choroid plexus
bilaterally compatible with incidental xanthogranulomatous.
Ventricle size normal. Mild chronic microvascular ischemic change in
the white matter and pons.

Vascular: Normal arterial flow voids

Skull and upper cervical spine: No focal skeletal lesion.

Sinuses/Orbits: Paranasal sinuses clear. Bilateral cataract
extraction

Other: None

MRA HEAD FINDINGS

Internal carotid artery widely patent bilaterally. Anterior and
middle cerebral arteries widely patent

Normal posterior circulation. No stenosis or large vessel occlusion.
Negative for aneurysm.

MRA NECK FINDINGS

Antegrade flow in the carotid and vertebral arteries bilaterally. No
stenosis or occlusion identified.
IMPRESSION: 1. Multiple small areas of acute infarct in the cerebral hemispheres
bilaterally and in the cerebellum bilaterally. Probable emboli.
2. Negative MRA head
3. Negative MRA neck
4. No source of emboli identified in the head neck.

## 2021-04-10 MED ORDER — SODIUM CHLORIDE 0.9 % IV SOLN: INTRAVENOUS | Status: AC

## 2021-04-10 MED ORDER — QUETIAPINE FUMARATE 25 MG PO TABS: 25.0000 mg | ORAL_TABLET | Freq: Every day | ORAL | Status: DC

## 2021-04-10 MED ORDER — ASPIRIN 300 MG RE SUPP: 150.0000 mg | Freq: Every day | RECTAL | Status: DC

## 2021-04-10 MED ORDER — QUETIAPINE FUMARATE 50 MG PO TABS
25.0000 mg | ORAL_TABLET | Freq: Every day | ORAL | Status: DC
  Administered 2021-04-10 – 2021-04-13 (×3): 25 mg via ORAL
  Filled 2021-04-10 (×4): qty 1

## 2021-04-10 MED ORDER — HALOPERIDOL LACTATE 5 MG/ML IJ SOLN
2.0000 mg | Freq: Four times a day (QID) | INTRAMUSCULAR | Status: DC | PRN
  Administered 2021-04-12 (×2): 2 mg via INTRAVENOUS
  Filled 2021-04-10 (×2): qty 1

## 2021-04-10 MED ORDER — MORPHINE SULFATE (PF) 2 MG/ML IV SOLN
2.0000 mg | INTRAVENOUS | Status: DC | PRN
  Administered 2021-04-13 – 2021-04-14 (×3): 2 mg via INTRAVENOUS
  Filled 2021-04-10 (×3): qty 1

## 2021-04-10 MED ORDER — ASPIRIN 81 MG PO CHEW
81.0000 mg | CHEWABLE_TABLET | Freq: Every day | ORAL | Status: DC
  Administered 2021-04-10 – 2021-04-13 (×4): 81 mg via ORAL
  Filled 2021-04-10 (×4): qty 1

## 2021-04-10 MED ORDER — IPRATROPIUM BROMIDE 0.02 % IN SOLN: 0.5000 mg | Freq: Four times a day (QID) | RESPIRATORY_TRACT | Status: DC | PRN

## 2021-04-10 MED ORDER — LEVALBUTEROL HCL 0.63 MG/3ML IN NEBU
0.6300 mg | INHALATION_SOLUTION | Freq: Four times a day (QID) | RESPIRATORY_TRACT | Status: DC | PRN
  Filled 2021-04-10: qty 3

## 2021-04-10 MED ORDER — MUSCLE RUB 10-15 % EX CREA
TOPICAL_CREAM | CUTANEOUS | Status: DC | PRN
  Administered 2021-04-10 – 2021-04-11 (×2): 1 via TOPICAL
  Filled 2021-04-10: qty 85

## 2021-04-10 MED ORDER — DOXYCYCLINE HYCLATE 100 MG IV SOLR
100.0000 mg | Freq: Two times a day (BID) | INTRAVENOUS | Status: DC
  Administered 2021-04-10 – 2021-04-14 (×7): 100 mg via INTRAVENOUS
  Filled 2021-04-10 (×10): qty 100

## 2021-04-10 MED ORDER — STROKE: EARLY STAGES OF RECOVERY BOOK
Freq: Once | Status: AC
  Filled 2021-04-10: qty 1

## 2021-04-10 MED ORDER — DIPHENHYDRAMINE HCL 50 MG/ML IJ SOLN
12.5000 mg | Freq: Once | INTRAMUSCULAR | Status: AC
  Administered 2021-04-10: 08:00:00 12.5 mg via INTRAVENOUS
  Filled 2021-04-10: qty 1

## 2021-04-10 NOTE — Care Management Important Message (Signed)
Important Message  Patient Details  Name: Stacy Sanchez MRN: [id number] Date of Birth: 06/30/1945   Medicare Important Message Given:  Yes     Dorena Bodo 04/10/2021, 3:02 PM

## 2021-04-10 NOTE — Progress Notes (Signed)
RT set up CPAP and placed on patient with 3L O2 bled into circuit. Pt tolerating CPAP well at this time. RT will monitor as needed.

## 2021-04-10 NOTE — Progress Notes (Signed)
Progress Note    Stacy Sanchez   RUE:[id number]  DOB: 1946-04-21  DOA: 04/14/2021     4 PCP: Burnett Sheng, MD  Initial CC: abd pain  Hospital Course:  Stacy Sanchez is a 75 year old chronically ill-appearing cachectic and thin Caucasian female with past medical history significant for but not limited to bronchiectasis, CAD, CKD stage III, hypertension, hypothyroidism, neuropathy, cytopenia, CHF, MAC as well as other comorbidities who presented with abdominal pain.  Patient's had epigastric abdominal pain for the last few days and this is had similar pain waxing waning for the last week or so.    She had an episode of nausea without vomiting and has had a chronic cough as well.  She has had decreased p.o. intake and was planning to see GI for abdominal pain but this was rescheduled.  Was also noted to have extremely poor p.o. intake and a 30 pound weight loss in the last year or so.    Further work-up in the ED revealed that she had a hyperkalemia and elevated BUN/creatinine as well as mild leukocytosis.  BNP was elevated at 1600 however she did not appear volume overloaded.  Renal function was also worsened on admission.  Influenza panel and COVID were negative.  Urine blood cultures were obtained.   Chest x-ray was done and showed prominent interstitial markings and they were unable to exclude airspace disease.  Ultrasound abdomen showed mild wall thickening of the gallbladder without Murphy sign for possible cholecystitis.  She was initiated on doxycycline and IVF.   Palliative care also consulted.  During her hospitalization she ended up having a transient episode of slurred speech so she went for a stat head CT scan and a telemetry neurology consult was initiated.   Neurology evaluated and recommending an MRI that is pacemaker compatible at [facility] as well as the EEG as well as frequent neurochecks.   EEG did not reveal evidence of seizure activity. Remeron was discontinued per  neurology.  GI evaluation is on hold until further neurologic work-up completed.   Palliative met with the patient and patient's family and after further goals of care discussion her CODE STATUS was changed to DNR, DNI.  The family did not want a feeding tube and the recommendation was for deactivation for ICD.  The family was to let the patient be transferred to [facility] for further neuro work-up before finally making decision but they are open to hospice support.  Likely recommendation will be discharged back to [facility] with hospice support.    Interval History:  No events overnight.  Awaiting MRI this morning when seen.  Family friend present bedside. Patient unable to provide any collateral information or answer questions.  She seemed very confused and disoriented.  Assessment & Plan:  Adult failure to thrive Generalized weakness and deconditioning Dysphagia with anorexia Admitted for Adult failure considering patient has lost 30 pounds last year and had minimal p.o. intake with recurrent abdominal pain. - Potassium is elevated will check other electrolytes including magnesium and phosphorus. -Estimated body mass index is 15.66 kg/m - Nutrition consult has been placed and pending -Given her significant abdominal pain and decreased p.o. intake a CT scan of the abdomen pelvis has been done and GIs been consulted and they evaluated and discussed EGD however workup on hold in setting of neuro issues currently and GOC discussions - continue IVF -Family considering hospice level care but wishing for neuro evaluation to complete -Seen by GI by Dr. Silverio Decamp about a month  ago and had a subsequent esophagram which showed moderate esophageal dysmotility    Abdominal pain -As above -GI consulted for further evaluation and they discussed EGD but will hold off at this time -Currently awaiting palliative care discussions and she is now DNR/DNI.  Family does not want a feeding tube - s/p SLP eval;  okay for dys level 2 diet   Hyperkalemia - responded well to treatment - BMP in am   Leukocytosis ?  Pneumonia in the setting of known bronchiectasis and MAI Noted to have mild leukocytosis 12.3 in the ED which improved to 10.3.  Also chest x-ray with inability to rule out airspace disease. Doxycycline ordered in the ED will continue this for now. -Started on Xopenex and Atrovent - Follow-up urinalysis and blood cultures   EKG abnormality with minimally elevated troponins > New T wave inversions and mildly elevated troponin - Troponin trend flat and likely in the setting of demand ischemia -Cardiology was consulted given patient's family's request and they suspect demand ischemia given no CAD on cath 01/30/2020.  Patient denies any chest pain but has chronic dyspnea and is progressively worsening in the setting of chronic MAI infection and bronchiectasis -Cardiology feels that we need to continue with supportive care no indication for ischemic work-up is indicated at this time -After palliative discussions recommendations for deactivation of ICD   Transient slurred speech and right-sided weakness with delayed responses Left-sided choreiform movements Aphasia  -Initially happened and when patient was pale but now speech is improved.   -Head CT without contrast has been ordered and telemetry neurology to see the patient and head CT was negative but neurology recommending MRI and EEG  -EEG showed suggestive moderate diffuse encephalopathy of nonspecific etiology with no seizures or definitive epileptiform discharges - follow up MRIs  -Pending MRI results and neurology evaluation patient may go hospice   Chronic systolic CHF - Known history of CHF last echo was last month with EF 45-50% and grade 1 diastolic dysfunction.;  She does have an echo at [facility] which showed an EF short of 20 to 25% - wean IVF as diet advances  -Her beta-blocker was held due to soft blood pressures -She is  unable to tolerate losartan or lisinopril due to cough and low blood pressures -She is off of digoxin with recovery of LV function -Cardiology recommending avoiding spironolactone due to hyperkalemia in the past - We will monitor for any evidence of worsening respirations and consider CHF exacerbation at that time.  We will also be checking troponin as above concerning new EKG changes. - Holding home nadolol in the setting of low normal blood pressures - Not currently on daily diuretic -Cardiology recommends palliative care consultation and this has been ordered   Anxiety Depression - Continue home medications when confirmed - Reportedly on Remeron which will be resumed but now discontinued at the recommendation of neurology -Patient seems little bit anxious and may need to discuss with care about anxiety regimen given that the mirtazapine caused some choreiform movements -We will have palliative care address for anxiety and depression  Hypothyroidism - Continue home Synthroid of 150 mcg  -Initially TSH was normal and apparently was rechecked and showed elevation - check FT4 and TT3 - Continue current Synthroid dosing and repeat TSH in 4 to 6 weeks   Hypertension with history of orthostatic hypotension -Her recent midodrine was stopped given her systolic blood pressures been elevated previously -Nadolol has been held on admission due to her softer blood pressure -  Continue to monitor blood pressures per protocol   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 by bronchiectasis with recent pseudomonal infection -Continue supplemental oxygen and normally wears 2 L during the day and 2 L at night -Palliative care consulted for further goals of care discussion and she is now DNR/DNI   CAD Hyperlipidemia - Continue home rosuvastatin - Continue home aspirin -Cardiology following as above   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 > Creatinine stable at 1.37 on  admission but worsened to 66/2.58 and is now trending down - Avoid nephrotoxic meds, contrast dyes, hypotension renally dose medications - s/p IVF -Medical renal disease noted on renal ultrasound; bilateral renal cysts and left intrarenal stone - repeat BMP in am   OSA - Continue home CPAP; refused last night  Old records reviewed in assessment of this patient  Antimicrobials:   DVT prophylaxis: HSQ  Code Status:   Code Status: DNR  Disposition Plan:   Status is: Inpt  Objective: Blood pressure 129/87, pulse 89, temperature 98.4 F (36.9 C), temperature source Oral, resp. rate 20, height _0  (1.727 m), weight 45 kg, SpO2 98 %.  Examination:  Physical Exam Constitutional:      Appearance: Normal appearance.  HENT:     Head: Normocephalic and atraumatic.     Mouth/Throat:     Mouth: Mucous membranes are moist.  Eyes:     Extraocular Movements: Extraocular movements intact.  Cardiovascular:     Rate and Rhythm: Normal rate and regular rhythm.  Pulmonary:     Effort: Pulmonary effort is normal.     Breath sounds: Normal breath sounds.  Abdominal:     General: Bowel sounds are normal. There is no distension.     Palpations: Abdomen is soft.  Musculoskeletal:        General: Normal range of motion.     Cervical back: Normal range of motion and neck supple.  Skin:    General: Skin is warm and dry.  Neurological:     Mental Status: She is alert.     Comments: Left sided weakness noted. Aphasia appreciated     Consultants:    Procedures:    Data Reviewed: I have personally reviewed labs and imaging studies    LOS: 4 days  Time spent: Greater than 50% of the 35 minute visit was spent in counseling/coordination of care for the patient as laid out in the A&P.   Dwyane Dee, MD [facility] 04/10/2021, 2:03 PM

## 2021-04-10 NOTE — Progress Notes (Addendum)
SLP Cancellation Note  Patient Details Name: Stacy Sanchez MRN: [id number] DOB: 07-03-1945   Cancelled treatment:       Reason Eval/Treat Not Completed: Patient at procedure or test/unavailable  Unable to complete cognitive linguistic evaluation or reassess swallow function and safety, as pt is currently off unit for MRI. Will continue efforts.   Rajah Tagliaferro B. Murvin Natal, [facility], CCC-SLP Speech Language Pathologist Office: [phone]  Leigh Aurora 04/10/2021, 11:48 AM  Continues to be off unit at 12:55. Will continue efforts.  Adele Milson B. Murvin Natal, MSP, CCC-SLP Speech Language Pathologist Office: [phone]

## 2021-04-10 NOTE — Consult Note (Signed)
Subjective: Transferred from [facility] overnight, became slightly more delirious.  She is not sleeping at night.  Exam: Vitals:   04/10/21 0552 04/10/21 0756  BP:  129/87  Pulse: 91 89  Resp: 20 20  Temp:  98.4 F (36.9 C)  SpO2: 97% 98%   Gen: In bed, NAD Resp: non-labored breathing, no acute distress Abd: soft, nt  Neuro: MS: Awake, able to answer some simple questions and follow simple commands.  She is not very cooperative, does not answer orientation questions CN: Pupils reactive bilaterally, tracks across midline in both directions. Motor: She has a flaccid left arm, wiggles left toes to stimulation, moves right side much better Sensory: Response to noxious stimulation bilaterally  Pertinent results: B1 pending (was on a multivitamin prior to checking) B12 561  BUN 58, creatinine 1.95 Ammonia 43  A.m. cortisol 16.8 TSH 12.7 -> 7.2, free T4 1.21  RPR negative  EEG-moderate encephalopathy    Impression: 75 year old female with recent severe weight loss with delirium in the setting of AKI, hypothyroidism, abdominal pain, likely stroke.  Initially there was concern for some abnormal movements on the left side but I suspect that this was an evolving infarct now that the left side is plegic.  She had an ICD therefore needed to be transferred from [facility] to receive an MRI.  With transfer, she became more delirious, I suspect due to situational change.  She also has not been sleeping recently.  Palliative care is involved.  With her recent weight loss, I am concerned for possibly Wernicke's encephalopathy as playing a role and have started her on high-dose thiamine.  Unclear how much the thiamine level will be helpful given she was started on multivitamin, a low level would be helpful but a normal level would not be very telling.    Recommendations: 1) MRI brain 2) will start scheduled seroquel to help restore day/night cycle.  3) lights on during day/off at  night.  4) continue ASA 5) neurochecks 6) will follow  Ritta Slot, MD [facility] [phone]  If 7pm- 7am, please page neurology on call as listed in AMION.

## 2021-04-10 NOTE — Evaluation (Addendum)
Physical Therapy Evaluation Patient Details Name: Stacy Sanchez MRN: [id number] DOB: 1946/03/04 Today's Date: 04/10/2021  History of Present Illness  Stacy Sanchez is a 75 y.o. female with medical history significant of bronchiectasis, CAD, CKD 3, hypertension, hypothyroidism, neuropathy, cytopenia, CHF, MAC presenting with ongoing abdominal pain. Patient lives [facility] independent living at [facility] living center. 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    Clinical Impression  Patient received in bed, daughter at bedside. She is alert. Oriented to place and person. She has difficulty expressing self to answer questions asked. Difficulty following directions for assessment. She requires total assist for bed mobility at this time. Poor sitting balance requiring max assist to maintain. Poor initiation. Patient will continue to benefit from skilled PT while here to improve functional mobility, strength and safety for return to independent living if able. At this time patient will benefit from skilled nursing placement.            Recommendations for follow up therapy are one component of a multi-disciplinary discharge planning process, led by the attending physician.  Recommendations may be updated based on patient status, additional functional criteria and insurance authorization.  Follow Up Recommendations Skilled nursing-short term rehab (<3 hours/day)    Assistance Recommended at Discharge Frequent or constant Supervision/Assistance  Functional Status Assessment Patient has had a recent decline in their functional status and demonstrates the ability to make significant improvements in function in a reasonable and predictable amount of time.  Equipment Recommendations  Other (comment) (TBD)     Recommendations for Other Services       Precautions / Restrictions Precautions Precautions: Fall Restrictions Weight Bearing Restrictions: No      Mobility  Bed Mobility Overal bed mobility: Needs Assistance Bed Mobility: Supine to Sit;Sit to Supine     Supine to sit: HOB elevated;Total assist Sit to supine: HOB elevated;Total assist   General bed mobility comments: very little volitional movement to attempt sitting up or moving in bed    Transfers                   General transfer comment: not attempted    Ambulation/Gait               General Gait Details: unsafe to attempt  Stairs            Wheelchair Mobility    Modified Rankin (Stroke Patients Only) Modified Rankin (Stroke Patients Only) Pre-Morbid Rankin Score: Moderate disability Modified Rankin: Severe disability     Balance Overall balance assessment: Needs assistance Sitting-balance support: Feet unsupported Sitting balance-Leahy Scale: Zero Sitting balance - Comments: requires assist to sit up       Standing balance comment: not attempted                             Pertinent Vitals/Pain Pain Assessment: Faces Faces Pain Scale: Hurts even more Pain Location: Left wrist, bruised and swollen Pain Descriptors / Indicators: Discomfort;Grimacing;Guarding;Moaning Pain Intervention(s): Monitored during session;Limited activity within patient's tolerance;Repositioned    Home Living Family/patient expects to be discharged to:: Assisted living   Available Help at Discharge: Family;Available PRN/intermittently             Home Equipment: Rolling Walker (2 wheels) Additional Comments: Patient using walker at baseline  per daughter    Prior Function Prior Level of Function : Independent/Modified Independent             Mobility Comments: uses walker       Hand Dominance   Dominant Hand: Right    Extremity/Trunk Assessment   Upper Extremity  Assessment Upper Extremity Assessment: Defer to OT evaluation    Lower Extremity Assessment Lower Extremity Assessment: RLE deficits/detail;LLE deficits/detail RLE Coordination: decreased gross motor LLE Deficits / Details: unsure of sensation. She says she can feel her legs but unsure of accuracy LLE Coordination: decreased gross motor       Communication   Communication: Expressive difficulties (difficulty completing sentence/thought when asked a question)  Cognition Arousal/Alertness: Awake/alert Behavior During Therapy: WFL for tasks assessed/performed Overall Cognitive Status: Impaired/Different from baseline Area of Impairment: Orientation;Awareness;Following commands;Safety/judgement;Memory;Problem solving                 Orientation Level: Disoriented to;Time;Situation   Memory: Decreased short-term memory Following Commands: Follows one step commands inconsistently Safety/Judgement: Decreased awareness of safety;Decreased awareness of deficits Awareness: Intellectual Problem Solving: Slow processing;Requires verbal cues;Requires tactile cues;Difficulty sequencing;Decreased initiation General Comments: Patient having difficulty following directions. Asked her to move left leg and she moves right leg. Does not raise left arm either without assist. Functional Status Assessment: Patient has had a recent decline in their functional status and demonstrates the ability to make significant improvements in function in a reasonable and predictable amount of time.      General Comments      Exercises     Assessment/Plan    PT Assessment Patient needs continued PT services  PT Problem List Decreased strength;Decreased mobility;Decreased activity tolerance;Decreased balance;Decreased cognition;Decreased knowledge of precautions;Decreased safety awareness;Pain       PT Treatment Interventions Therapeutic activities;DME instruction;Gait training;Therapeutic  exercise;Functional mobility training;Balance training;Patient/family education;Cognitive remediation    PT Goals (Current goals can be found in the Care Plan section)  Acute Rehab PT Goals Patient Stated Goal: to improve PT Goal Formulation: With family Time For Goal Achievement: 04/24/21 Potential to Achieve Goals: Fair    Frequency Min 3X/week   Barriers to discharge        Co-evaluation               AM-PAC PT "6 Clicks" Mobility  Outcome Measure Help needed turning from your back to your side while in a flat bed without using bedrails?: Total Help needed moving from lying on your back to sitting on the side of a flat bed without using bedrails?: Total Help needed moving to and from a bed to a chair (including a wheelchair)?: Total Help needed standing up from a chair using your arms (e.g., wheelchair or bedside chair)?: Total Help needed to walk in hospital room?: Total Help needed climbing 3-5 steps with a railing? : Total 6 Click Score: 6    End of Session Equipment Utilized During Treatment: Oxygen Activity Tolerance: Patient limited by lethargy;Patient limited by fatigue;Other (comment) (cognition) Patient left: in bed;with call bell/phone within reach;with bed alarm set;with family/visitor present Nurse Communication: Mobility status PT Visit Diagnosis: Muscle weakness (generalized) (M62.81);Other abnormalities of gait and mobility (R26.89)    Time: 3762-8315 PT Time Calculation (min) (ACUTE ONLY): 16 min   Charges:   PT Evaluation $PT Eval Moderate Complexity: 1 Mod          Solangel Mcmanaway, PT, GCS 04/10/21,3:07 PM

## 2021-04-10 NOTE — Progress Notes (Addendum)
Patent examiner [facility]) Hospital Liaison  Notified by Transition of Care Manger of patient/family request for [facility] services at home after discharge. Chart and patient information under review by [facility] physician.   Hospice eligibility confirmed.    Writer spoke with Maralyn Sago (daughter) to initiate education related to hospice philosophy, services and team approach to care.           verbalized understanding of information given. Per discussion, plan is for discharge to home by or PTAR.   Discharge date is unknown. [facility] will continue to follow. Please call with any questions. Thank you  Lynder Parents [facility] Liaison [phone]

## 2021-04-10 NOTE — Progress Notes (Signed)
Daily Progress Note   Patient Name: Stacy Sanchez       Date: 04/10/2021 DOB: [birth year]  Age: 75 y.o. MRN#: [id number] Attending Physician: Dwyane Dee, MD Primary Care Physician: Burnett Sheng, MD Admit Date: 04/16/2021  Reason for Consultation/Follow-up: Establishing goals of care  Subjective: Patient is awake alert resting in bed, she follows some commands, she mumbles some, she moans at times.  There is a lady present in the room who states she is not family member, she calls patient's daughter Stacy Sanchez and I spoke with Stacy Sanchez about the patient's current condition.  Stacy Sanchez asked about MRI brain results, those were pulled up and shared with the patient's daughter Stacy Sanchez to the best of my ability, see below.  Length of Stay: 4  Current Medications: Scheduled Meds:   aspirin  81 mg Oral Daily   feeding supplement  237 mL Oral BID BM   heparin injection (subcutaneous)  5,000 Units Subcutaneous Q8H   levothyroxine  50 mcg Oral Once per day on Mon Tue Wed Thu Fri   And   levothyroxine  100 mcg Oral Once per day on Sun Sat   polyethylene glycol  17 g Oral Daily   QUEtiapine  25 mg Oral QHS   sodium chloride flush  3 mL Intravenous Q12H   [START ON 04/17/2021] thiamine injection  100 mg Intravenous Daily    Continuous Infusions:  sodium chloride 75 mL/hr at 04/10/21 1503   doxycycline (VIBRAMYCIN) IV 100 mg (04/10/21 1511)   thiamine injection 500 mg (04/10/21 1513)   Followed by   Derrill Memo ON 04/11/2021] thiamine injection      PRN Meds: acetaminophen **OR** acetaminophen, haloperidol lactate, ipratropium, ipratropium-albuterol, levalbuterol, morphine injection, ondansetron (ZOFRAN) IV, polyethylene glycol  Physical Exam         Awake alert Mumbles some words has aphasia Has  left-sided weakness Follows some commands She is alert  Vital Signs: BP 129/87 (BP Location: Left Arm)    Pulse 89    Temp 98.4 F (36.9 C) (Oral)    Resp 20    Ht _0  (1.727 m)    Wt 45 kg    SpO2 98%    BMI 15.08 kg/m  SpO2: SpO2: 98 % O2 Device: O2 Device: Nasal Cannula O2 Flow Rate: O2 Flow Rate (L/min): 3 L/min  Intake/output summary:  Intake/Output Summary (Last 24 hours) at 04/10/2021 1655 Last data filed at 04/10/2021 0400 Gross per 24 hour  Intake 851.83 ml  Output 525 ml  Net 326.83 ml   LBM: Last BM Date: 04/05/2021 Baseline Weight: Weight: 46.7 kg Most recent weight: Weight: 45 kg       Palliative Assessment/Data:    Flowsheet Rows    Flowsheet Row Most Recent Value  Intake Tab   Referral Department Hospitalist  Unit at Time of Referral Med/Surg Unit  Palliative Care Primary Diagnosis Cancer  Date Notified 04/07/21  Palliative Care Type New Palliative care  Reason for referral Clarify Goals of Care  Date of Admission 04/20/2021  Date first seen by Palliative Care 04/10/21  # of days Palliative referral response time 3 Day(s)  # of days IP prior to Palliative referral 1  Clinical Assessment   Palliative Performance Scale Score 30%  Psychosocial & Spiritual Assessment   Palliative Care Outcomes   Patient/Family meeting held? Yes  Who was at the meeting? patient, daughter       Patient Active Problem List   Diagnosis Date Noted   Pain of upper abdomen    Unintentional weight loss    Adult failure to thrive 04/10/2021   Abnormal EKG 04/11/2021   Hyperkalemia 04/01/2021   Acute bronchitis 05/09/2020   Dysuria 05/09/2020   Nonischemic cardiomyopathy ([place]) 01/31/2020   Syncope, cardiogenic 01/29/2020   OSA on CPAP 11/09/2019   Altered mental status    AKI (acute kidney injury) ([place]) 07/14/2019   Thrombocytopenia ([place]) 07/14/2019   Hypothyroidism 16/04/930   Acute metabolic encephalopathy 35/57/3220   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   Chronic respiratory  failure with hypoxia ([place]) 12/07/2018   Bronchiectasis ([place]) 11/27/2018   Pseudomonas aeruginosa infection 11/27/2018   Thrush 11/27/2018   CAD (coronary artery disease) 11/27/2018   CKD (chronic kidney disease), stage III ([place]) 11/27/2018   Essential hypertension 11/27/2018   Generalized anxiety disorder 11/27/2018   Peripheral neuropathy 11/27/2018   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  Patient was admitted with acute kidney injury and palliative consultation was done.  Due to change in mental status neurology evaluation was done.  MRI which is pacemaker compatible was recommended as patient has ICD device.  Recommendations/Plan: Palliative medicine team continues to follow for monitoring hospital course and overall disease trajectory of illness as well as to engage with the patient's next of kin with regards to broad goals of care and disposition options.  On the phone with daughter Stacy Sanchez, she asked about MRI results which were reviewed.  Likely suggest acute infarcts in both hemispheres. Noted the documentation from Ms. Paisley LCSW about patient being from independent living prior to this hospitalization.  Discussed with family on the phone about monitoring how the patient does over the course of the next few days as a time-limited trial to determine addition of hospice support and appropriate disposition options.  Palliative to follow.    Code  Status:    Code Status Orders  (From admission, onward)  Start     Ordered   04/09/21 1320  Do not attempt resuscitation (DNR)  Continuous       Question Answer Comment  In the event of cardiac or respiratory ARREST Do not call a code blue   In the event of cardiac or respiratory ARREST Do not perform Intubation, CPR, defibrillation or ACLS   In the event of cardiac or respiratory ARREST Use medication by any route, position, wound care, and other measures to relive pain and suffering. May use oxygen, suction and manual treatment of airway obstruction as needed for comfort.      04/09/21 1319           Code Status History     Date Active Date Inactive Code Status Order ID Comments User Context   04/08/2021 2245 04/09/2021 1319 Full Code [id number]  Marcelyn Bruins, MD Inpatient   03/23/2020 0822 03/23/2020 1442 Full Code [id number]  Jolaine Artist, MD Inpatient   07/14/2019 1024 07/17/2019 2113 Full Code [id number]  Vianne Bulls, MD ED   11/27/2018 1836 12/01/2018 2020 Full Code [id number]  Karie Kirks, DO ED      Advance Directive Documentation    Flowsheet Row Most Recent Value  Type of Advance Directive Healthcare Power of Attorney  Pre-existing out of facility DNR order (yellow form or pink MOST form) --  "MOST" Form in Place? --       Prognosis:  Unable to determine  Discharge Planning: To Be Determined  Care plan was discussed with  patient, daughter on the phone, also corresponded with Dr. Sabino Gasser [facility] MD via secure chat.  Thank you for allowing the Palliative Medicine Team to assist in the care of this patient.   Time In: 1600 Time Out: 1635 Total Time 35 Prolonged Time Billed  no       Greater than 50%  of this time was spent counseling and coordinating care related to the above assessment and plan.  Loistine Chance, MD  Please contact Palliative Medicine Team phone at [phone] for questions and concerns.

## 2021-04-10 NOTE — Evaluation (Signed)
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   Syncope, cardiogenic 01/29/2020   OSA on CPAP 11/09/2019   Altered mental status    AKI (acute kidney injury) ([place]) 07/14/2019   Thrombocytopenia ([place]) 07/14/2019   Hypothyroidism 99/83/3825   Acute metabolic encephalopathy 05/39/7673   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   Chronic respiratory failure with hypoxia ([place]) 12/07/2018   Bronchiectasis ([place]) 11/27/2018   Pseudomonas aeruginosa infection 11/27/2018   Thrush 11/27/2018   CAD (coronary artery disease) 11/27/2018   CKD (chronic kidney disease), stage III ([place]) 11/27/2018   Essential hypertension 11/27/2018   Generalized anxiety disorder 11/27/2018   Peripheral neuropathy 11/27/2018   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   Memory loss of unknown cause 05/03/2016   Major depressive disorder with single episode, in partial remission ([place]) 10/12/2014   Anxiety state 02/27/2011   Past Medical History:  Past Medical History:  Diagnosis Date   Arthritis    CHF (congestive heart failure) ([place])    Hypertension    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[place])    Pseudomonas respiratory infection    Thyroid disease    Past Surgical History:  Past Surgical History:  Procedure Laterality Date   ABDOMINAL HYSTERECTOMY     BRONCHOSCOPY  2001   CARDIAC SURGERY     COLONOSCOPY  2008   CYST REMOVAL TRUNK  1965   Left axilla; benign   [place]   HAND SURGERY Right 1983   ICD  IMPLANT Left 02/01/2020   PATENT DUCTUS ARTERIOUS REPAIR  1952   RIGHT HEART CATH N/A 03/23/2020   Procedure: RIGHT HEART CATH;  Surgeon: Jolaine Artist, MD;  Location: [facility];  Service: Cardiovascular;  Laterality: N/A;   TONSILLECTOMY  1956   VARICOSE VEIN SURGERY  1972   HPI:  Patient is a 75 y.o. female with PMH: bronchiectasis, CAD, CKD 3, hypertension, hypothyroidism, neuropathy, cytopenia, CHF, MAC presenting with ongoing abdominal pain. She has a chronic cough, has had decreased appetite and PO intake at home with family reporting 30lb weight loss in past year or so. In ED, vital signs stable, CXR showing stable prominent interstitial markings and unable to exclude airspace disease. She had OP MBS on 03/20/21: unremarkable oropharyngeal swallow.  There was perhaps mildly prolonged mastication/bolus cohesion of solids, potentially attributable to xerostomia.  There was mild vallecular residue throughout the study, regardless of consistency, suggesting decreased base of tongue/pharyngeal wall contact. She also had an Esophagram on 03/20/21: Moderate esophageal dysmotility with poor initiation of stripping wave but no stricture or mass in esophagus.   Assessment / Plan / Recommendation Clinical Impression  Pt presents with what appears to be Language of Confusion. She has difficulty answering yes/no questions and following commands consistently. Expressively, she has difficulty with automatic sequences and repetition. She was able to answer responsive naming tasks, but was unable to provide one category member (animals) given one minute. Her responses are perseverative and paraphasic, but she seems to be at least partially aware of this, stating "  no, that's not right". She is pleasant and cooperative, and can produce full sentences that are appropriate for the conversation. Pt would benefit from continued ST intervention to maximize communicative effectiveness.    SLP Assessment   SLP Recommendation/Assessment: Patient needs continued Speech Language Pathology Services  SLP Visit Diagnosis: Cognitive communication deficit (R41.841)    Recommendations for follow up therapy are one component of a multi-disciplinary discharge planning process, led by the attending physician.  Recommendations may be updated based on patient status, additional functional criteria and insurance authorization.    Follow Up Recommendations  Other (comment) (tbd)    Assistance Recommended at Discharge  Frequent or constant Supervision/Assistance  Functional Status Assessment Patient has had a recent decline in their functional status and demonstrates the ability to make significant improvements in function in a reasonable and predictable amount of time.  Frequency and Duration min 2x/week  2 weeks      SLP Evaluation Cognition  Overall Cognitive Status: Impaired/Different from baseline Arousal/Alertness: Awake/alert Orientation Level: Oriented to person       Comprehension  Auditory Comprehension Overall Auditory Comprehension: Impaired Yes/No Questions: Impaired Basic Biographical Questions: 51-75% accurate Basic Immediate Environment Questions: 75-100% accurate Complex Questions: 50-74% accurate Commands: Impaired One Step Basic Commands: 75-100% accurate Two Step Basic Commands: 0-24% accurate Conversation: Simple Interfering Components: Attention EffectiveTechniques: Repetition;Pausing Visual Recognition/Discrimination Discrimination: Not tested Reading Comprehension Reading Status: Not tested    Expression Expression Primary Mode of Expression: Verbal Verbal Expression Overall Verbal Expression: Impaired Initiation: No impairment Automatic Speech: Name (difficulty with automatic sequences) Level of Generative/Spontaneous Verbalization: Sentence Repetition: Impaired Level of Impairment: Phrase level Naming: No impairment Pragmatics: No impairment Interfering  Components: Attention Non-Verbal Means of Communication: Not applicable Written Expression Dominant Hand: Right Written Expression: Not tested   Oral / Motor  Oral Motor/Sensory Function Overall Oral Motor/Sensory Function: Within functional limits Motor Speech Overall Motor Speech: Appears within functional limits for tasks assessed Intelligibility: Intelligible           Chamya Hunton B. Quentin Ore, [facility], [place] Speech Language Pathologist Office: [phone]  Shonna Chock 04/10/2021, 2:45 PM

## 2021-04-10 NOTE — Progress Notes (Signed)
Speech Language Pathology Treatment: Dysphagia  Patient Details Name: Stacy Sanchez MRN: [id number] DOB: [birth year] Today's Date: 04/10/2021 Time: 1320-1350 SLP Time Calculation (min) (ACUTE ONLY): 30 min  Assessment / Plan / Recommendation Clinical Impression  Pt was seen at bedside to reassess swallow function and safety. Pt is currently NPO due to lethargy earlier. Pt's daughter was present during this session. Oral care was completed with suction. Pt then accepted trials of ice chips, thin liquid, puree, and solid textures. Extended oral prep noted on solids, and pt  required assistance with self feeding. No overt s/s aspiration observed on any texture. Pt did demonstrate a congested, nonproductive cough long after PO trials.   Given confusion, difficulty with self feeding, and need for energy conservation, will recommend dys 2 diet with thin liquids, meds crushed in puree. Safe swallow precautions posted at [facility]. SLP will follow acutely to assess diet tolerance, and determine readiness to advance textures or complete instrumental assessment.    HPI HPI: Patient is a 75 y.o. female with PMH: bronchiectasis, CAD, CKD 3, hypertension, hypothyroidism, neuropathy, cytopenia, CHF, MAC presenting with ongoing abdominal pain. She has a chronic cough, has had decreased appetite and PO intake at home with family reporting 30lb weight loss in past year or so. In ED, vital signs stable, CXR showing stable prominent interstitial markings and unable to exclude airspace disease. She had OP MBS on 03/20/21: unremarkable oropharyngeal swallow.  There was perhaps mildly prolonged mastication/bolus cohesion of solids, potentially attributable to xerostomia.  There was mild vallecular residue throughout the study, regardless of consistency, suggesting decreased base of tongue/pharyngeal wall contact. She also had an Esophagram on 03/20/21: Moderate esophageal dysmotility with poor initiation of stripping wave but no  stricture or mass in esophagus.      SLP Plan  Continue with current plan of care      Recommendations for follow up therapy are one component of a multi-disciplinary discharge planning process, led by the attending physician.  Recommendations may be updated based on patient status, additional functional criteria and insurance authorization.    Recommendations  Diet recommendations: Dysphagia 2 (fine chop);Thin liquid Liquids provided via: Cup;Straw Medication Administration: Crushed with puree Supervision: Patient able to self feed;Staff to assist with self feeding;Full supervision/cueing for compensatory strategies Compensations: Minimize environmental distractions;Slow rate;Small sips/bites Postural Changes and/or Swallow Maneuvers: Seated upright 90 degrees;Upright 30-60 min after meal                Oral Care Recommendations: Oral care BID;Staff/trained caregiver to provide oral care Follow Up Recommendations: Other (comment) (tbd) SLP Visit Diagnosis: Dysphagia, unspecified (R13.10) Plan: Continue with current plan of care          Stacy Sanchez B. Quentin Ore, [facility], [place] Speech Language Pathologist Office: [phone]  Shonna Chock  04/10/2021, 2:18 PM

## 2021-04-10 NOTE — TOC Initial Note (Signed)
Transition of Care [facility]) - Initial/Assessment Note    Patient Details  Name: Stacy Sanchez MRN: [id number] Date of Birth: [birth year]  Transition of Care [facility]) CM/SW Contact:    Geralynn Ochs, LCSW Phone Number: 04/10/2021, 3:55 PM  Clinical Narrative:          CSW alerted by [facility] liaison that patient is from IDL, not ALF, at [facility], but she cannot return unless she can return, per family report. Noting PT recommendation for SNF and unable to stand. CSW met with patient's daughter at bedside to discuss concerns about discharge planning, if patient is unable to return to [place]. CSW presented options of private pay SNF with hospice, or rehab with palliative care. Daughter asked for time to discuss with MD, as she still has not heard anything about MRI results, as well as discuss with her sister. Daughter appreciative of information. CSW to follow.       Expected Discharge Plan:  (Undetermined at this time) Barriers to Discharge: Continued Medical Work up, Ship broker   Patient Goals and CMS Choice Patient states their goals for this hospitalization and ongoing recovery are:: patient unable to participate in goal setting CMS Medicare.gov Compare Post Acute Care list provided to:: Patient Represenative (must comment) Choice offered to / list presented to : Adult Children  Expected Discharge Plan and Services Expected Discharge Plan:  (Undetermined at this time)     Post Acute Care Choice: Hospice Living arrangements for the past 2 months: [place]                                      Prior Living Arrangements/Services Living arrangements for the past 2 months: [place] Lives with:: Self Patient language and need for interpreter reviewed:: No Do you feel safe going back to the place where you live?: Yes      Need for Family Participation in Patient Care: Yes (Comment) Care giver support system in place?: No  (comment)   Criminal Activity/Legal Involvement Pertinent to Current Situation/Hospitalization: No - Comment as needed  Activities of Daily Living Home Assistive Devices/Equipment: None ADL Screening (condition at time of admission) Patient's cognitive ability adequate to safely complete daily activities?: No Is the patient deaf or have difficulty hearing?: No Does the patient have difficulty seeing, even when wearing glasses/contacts?: Yes Does the patient have difficulty concentrating, remembering, or making decisions?: Yes Patient able to express need for assistance with ADLs?: No Does the patient have difficulty dressing or bathing?: Yes Independently performs ADLs?: No Communication: Independent Dressing (OT): Dependent Is this a change from baseline?: Pre-admission baseline Grooming: Dependent Is this a change from baseline?: Pre-admission baseline Feeding: Independent Bathing: Dependent Is this a change from baseline?: Pre-admission baseline Toileting: Needs assistance Is this a change from baseline?: Pre-admission baseline In/Out Bed: Dependent Is this a change from baseline?: Pre-admission baseline Walks in Home: Dependent Does the patient have difficulty walking or climbing stairs?: Yes Weakness of Legs: Both Weakness of Arms/Hands: Both  Permission Sought/Granted Permission sought to share information with : Facility Sport and exercise psychologist, Family Supports Permission granted to share information with : Yes, Verbal Permission Granted  Share Information with NAME: [name]  Permission granted to share info w AGENCY: [place]  Permission granted to share info w Relationship: Daughter     Emotional Assessment Appearance:: Appears stated age Attitude/Demeanor/Rapport: Unable to Assess Affect (typically observed): Unable to Assess Orientation: :  Oriented to Self Alcohol / Substance Use: Not Applicable Psych Involvement: No (comment)  Admission diagnosis:  Adult failure  to thrive [R62.7] Hyperkalemia [E87.5] RUQ abdominal pain [R10.11] Unintentional weight loss [R63.4] Failure to thrive in adult [R62.7] Bronchiectasis without complication (HCC) [N17.0] Pain of upper abdomen [R10.10] Community acquired pneumonia, unspecified laterality [J18.9] Patient Active Problem List   Diagnosis Date Noted   Pain of upper abdomen    Unintentional weight loss    Adult failure to thrive 03/22/2021   Abnormal EKG 03/26/2021   Hyperkalemia 04/14/2021   Acute bronchitis 05/09/2020   Dysuria 05/09/2020   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/31/2020   Syncope, cardiogenic 01/29/2020   OSA on CPAP 11/09/2019   Altered mental status    AKI (acute kidney injury) ([place]) 07/14/2019   Thrombocytopenia ([place]) 07/14/2019   Hypothyroidism 01/74/9449   Acute metabolic encephalopathy 67/59/1638   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   Chronic respiratory failure with hypoxia ([place]) 12/07/2018   Bronchiectasis ([place]) 11/27/2018   Pseudomonas aeruginosa infection 11/27/2018   Thrush 11/27/2018   CAD (coronary artery disease) 11/27/2018   CKD (chronic kidney disease), stage III ([place]) 11/27/2018   Essential hypertension 11/27/2018   Generalized anxiety disorder 11/27/2018   Peripheral neuropathy 11/27/2018   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

## 2021-04-10 NOTE — Progress Notes (Signed)
Patient has home CPAP at bedside.  Family member states they do not need assistance at this time.  RT available when needed.

## 2021-04-10 NOTE — Progress Notes (Signed)
Brief Neuro Update:  Was requested by daughter to come assess patient at bedside for worsening mentation. Encephalopathy is worse than when I saw her 2 night ago and has flaccid LUE and LLE weakness which is probably a stroke.  Daughter reports that patient has not been able to sleep. They tried CPAP but the patient was unable to tolerate and was eing taken off by RT.  Recs: - Will do a one time dose of Benadryl IV 12.5mg  once to help with sleep. Had tried melatonin last night with no help. - MRI Brain ordered but patient has an ICD and will need to be turned off in the AM before MRI could be done.  Plan discussed with Dr. Julian Reil with the Hospitalist team overnight.  Erick Blinks [facility] Pager Number [phone]

## 2021-04-10 NOTE — Plan of Care (Signed)

## 2021-04-11 ENCOUNTER — Ambulatory Visit: Payer: Medicare HMO | Admitting: Physician Assistant

## 2021-04-11 ENCOUNTER — Inpatient Hospital Stay (HOSPITAL_COMMUNITY): Payer: Medicare HMO

## 2021-04-11 DIAGNOSIS — R627 Adult failure to thrive: Secondary | ICD-10-CM

## 2021-04-11 DIAGNOSIS — I6389 Other cerebral infarction: Secondary | ICD-10-CM

## 2021-04-11 LAB — HEMOGLOBIN A1C
Hgb A1c MFr Bld: 6.3 % — ABNORMAL HIGH (ref 4.8–5.6)
Mean Plasma Glucose: 134.11 mg/dL

## 2021-04-11 LAB — CBC WITH DIFFERENTIAL/PLATELET
Abs Immature Granulocytes: 0.03 10*3/uL (ref 0.00–0.07)
Basophils Absolute: 0.1 10*3/uL (ref 0.0–0.1)
Basophils Relative: 1 %
Eosinophils Absolute: 0.2 10*3/uL (ref 0.0–0.5)
Eosinophils Relative: 2 %
HCT: 34.9 % — ABNORMAL LOW (ref 36.0–46.0)
Hemoglobin: 10.1 g/dL — ABNORMAL LOW (ref 12.0–15.0)
Immature Granulocytes: 0 %
Lymphocytes Relative: 15 %
Lymphs Abs: 1.1 10*3/uL (ref 0.7–4.0)
MCH: 29.9 pg (ref 26.0–34.0)
MCHC: 28.9 g/dL — ABNORMAL LOW (ref 30.0–36.0)
MCV: 103.3 fL — ABNORMAL HIGH (ref 80.0–100.0)
Monocytes Absolute: 0.6 10*3/uL (ref 0.1–1.0)
Monocytes Relative: 8 %
Neutro Abs: 5.3 10*3/uL (ref 1.7–7.7)
Neutrophils Relative %: 74 %
Platelets: 198 10*3/uL (ref 150–400)
RBC: 3.38 MIL/uL — ABNORMAL LOW (ref 3.87–5.11)
RDW: 16.4 % — ABNORMAL HIGH (ref 11.5–15.5)
WBC: 7.2 10*3/uL (ref 4.0–10.5)
nRBC: 0 % (ref 0.0–0.2)

## 2021-04-11 LAB — LACTATE DEHYDROGENASE: LDH: 109 U/L (ref 98–192)

## 2021-04-11 LAB — CULTURE, BLOOD (ROUTINE X 2)
Culture: NO GROWTH
Special Requests: ADEQUATE

## 2021-04-11 LAB — LIPID PANEL
Cholesterol: 95 mg/dL (ref 0–200)
HDL: 45 mg/dL (ref >40)
LDL Cholesterol: 28 mg/dL (ref 0–99)
Total CHOL/HDL Ratio: 2.1 RATIO
Triglycerides: 112 mg/dL (ref <150)
VLDL: 22 mg/dL (ref 0–40)

## 2021-04-11 LAB — ECHOCARDIOGRAM COMPLETE
AR max vel: 2.23 cm2
AV Area VTI: 2.46 cm2
AV Area mean vel: 2.23 cm2
AV Mean grad: 3 mmHg
AV Peak grad: 5.9 mmHg
Ao pk vel: 1.21 m/s
Area-P 1/2: 5.46 cm2
Height: 68 in
S' Lateral: 2.4 cm
Weight: 1587.31 oz

## 2021-04-11 LAB — BASIC METABOLIC PANEL
Anion gap: 5 (ref 5–15)
BUN: 54 mg/dL — ABNORMAL HIGH (ref 8–23)
CO2: 28 mmol/L (ref 22–32)
Calcium: 8.3 mg/dL — ABNORMAL LOW (ref 8.9–10.3)
Chloride: 104 mmol/L (ref 98–111)
Creatinine, Ser: 1.79 mg/dL — ABNORMAL HIGH (ref 0.44–1.00)
GFR, Estimated: 29 mL/min — ABNORMAL LOW (ref >60)
Glucose, Bld: 103 mg/dL — ABNORMAL HIGH (ref 70–99)
Potassium: 5 mmol/L (ref 3.5–5.1)
Sodium: 137 mmol/L (ref 135–145)

## 2021-04-11 LAB — MAGNESIUM: Magnesium: 1.8 mg/dL (ref 1.7–2.4)

## 2021-04-11 LAB — T4, FREE: Free T4: 0.63 ng/dL (ref 0.61–1.12)

## 2021-04-11 LAB — VITAMIN B1: Vitamin B1 (Thiamine): 110.3 nmol/L (ref 66.5–200.0)

## 2021-04-11 MED ORDER — BISACODYL 10 MG RE SUPP
10.0000 mg | Freq: Every day | RECTAL | Status: DC | PRN
  Administered 2021-04-11: 20:00:00 10 mg via RECTAL
  Filled 2021-04-11: qty 1

## 2021-04-11 MED ORDER — LORAZEPAM 2 MG/ML IJ SOLN
0.5000 mg | Freq: Four times a day (QID) | INTRAMUSCULAR | Status: DC | PRN
  Administered 2021-04-11: 10:00:00 0.5 mg via INTRAVENOUS
  Filled 2021-04-11: qty 1

## 2021-04-11 MED ORDER — CHLORHEXIDINE GLUCONATE CLOTH 2 % EX PADS
6.0000 | MEDICATED_PAD | Freq: Every day | CUTANEOUS | Status: DC
  Administered 2021-04-11 – 2021-04-13 (×3): 6 via TOPICAL

## 2021-04-11 NOTE — Progress Notes (Signed)
MD on call chat that patient family is requesting suppository for BM

## 2021-04-11 NOTE — Progress Notes (Signed)
PROGRESS NOTE  Stacy Sanchez UXL:[id number] DOB: 03/30/1946 DOA: 04/21/2021 PCP: Burnett Sheng, MD   LOS: 5 days   Brief Narrative / Interim history: 75 year old female with history of bronchiectasis, CAD, CKD 3, HTN, hypothyroidism, MAC was admitted to the hospital with abdominal pain, waxing and waning over the last several days up to a week.  She has been having decreased p.o. intake as well as weight loss and was seeing GI as an outpatient but had to be rescheduled.  She was admitted to the hospital, was found to have acute kidney injury and hyperkalemia.  She had an episode of slurred speech, and an MRI done showed multiple embolic strokes and neurology was consulted.  She was transferred to [facility].  She has been intermittently confused  Subjective / 24h Interval events: Appears confused, delirious.  Intermittently saying that she has abdominal pain  Assessment & Plan: Principal Problem Abdominal pain, FTT, dysphagia with anorexia, severe protein calorie malnutrition-patient has been having about a 30 pound weight loss in the last year and had very poor p.o. intake, was found to be dehydrated on admission.  BMI is 15.  A CT scan of the abdomen and pelvis without any significant acute findings.  GI consulted, they are entertaining an EGD however family decided not to pursue due to patient's deterioration/acute CVAs.  Supportive treatment for now  Active Problems Acute CVA-MRI done 12/20 showed multiple small areas of acute infarct in the cerebral hemispheres bilaterally, and in the cerebellum bilaterally, likely embolic. Neurology consulted, discussed with Dr. Leonel Ramsay today.  Continue aspirin, awaiting therapies recommendations.  2D echo pending.  Lipid panel shows an LDL of 28.  A1c 6.3.  Underwent an EEG which showed moderate diffuse encephalopathy of nonspecific etiology without seizures or epileptiform discharges.  Probable pneumonia-on admission showed mild leukocytosis, and  chest x-ray unable to rule out airspace disease.  She has been started on doxycycline, she is afebrile and WBC has normalized now, plan for 5-day course    Hyperkalemia -potassium improved, 5.0 this morning.  Repeat tomorrow  EKG abnormality with minimally elevated troponins, history of CAD-New T wave inversions and mildly elevated troponin. Troponin trend flat and likely in the setting of demand ischemia. Cardiology was consulted given patient's family's request and they suspect demand ischemia given no CAD on cath 01/30/2020.  Patient denies any chest pain but has chronic dyspnea and is progressively worsening in the setting of chronic MAI infection and bronchiectasis.  No further work-up per cards  Urinary retention-had to get an In-N-Out cath overnight last night.  Suspect may contribute to her abdominal discomfort.  Continue to monitor with repeat bladder scan and place a Foley if this is persistent  Acute metabolic encephalopathy -likely component of in-hospital delirium as well.  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  Due to poor p.o. intake and weight loss thiamine deficiency is a consideration and has been started on thiamine per neurology.   Chronic systolic CHF -Known history of CHF last echo was last month with EF 45-50% and grade 1 diastolic dysfunction.  Repeat 2D echo pending.   Anxiety, depression -Reportedly on Remeron which will be resumed but now discontinued at the recommendation of neurology  History of VTE status post ICD-defibrillator turned off today per family wishes  Hypothyroidism -Continue home Synthroid of 150 mcg    Hypertension with history of orthostatic hypotension -Her recent midodrine was stopped given her systolic blood pressures been elevated previously. Nadolol has been held on  admission due to her softer blood pressure  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 by bronchiectasis with recent pseudomonal  infection -Continue supplemental oxygen and normally wears 2 L during the day and 2 L at night -Palliative care consulted for further goals of care discussion and she is now DNR/DNI   AKI on CKD 3b - Creatinine stable at 1.37 on admission but worsened to up to 2.58 and is now trending down.    OSA -Continue home CPAP  Goals of care-patient has had a significant decline in the last several months, weight loss, failure to thrive and now here with worsening mentation and bilateral embolic strokes.  Palliative care consulted.  Continue to monitor see how she does over the next day or 2, and based on how much she eats and how she does with therapies to determine whether she should go to SNF with palliative versus residential hospice.  Scheduled Meds:  aspirin  81 mg Oral Daily   feeding supplement  237 mL Oral BID BM   heparin injection (subcutaneous)  5,000 Units Subcutaneous Q8H   levothyroxine  50 mcg Oral Once per day on Mon Tue Wed Thu Fri   And   levothyroxine  100 mcg Oral Once per day on Sun Sat   polyethylene glycol  17 g Oral Daily   QUEtiapine  25 mg Oral QHS   sodium chloride flush  3 mL Intravenous Q12H   [START ON 04/17/2021] thiamine injection  100 mg Intravenous Daily   Continuous Infusions:  doxycycline (VIBRAMYCIN) IV 100 mg (04/10/21 2354)   thiamine injection     PRN Meds:.acetaminophen **OR** acetaminophen, haloperidol lactate, ipratropium, ipratropium-albuterol, levalbuterol, LORazepam, morphine injection, Muscle Rub, ondansetron (ZOFRAN) IV, polyethylene glycol  Diet Orders (From admission, onward)     Start     Ordered   04/10/21 1415  DIET DYS 2 Room service appropriate? Yes; Fluid consistency: Thin  Diet effective now       Question Answer Comment  Room service appropriate? Yes   Fluid consistency: Thin      04/10/21 1414            DVT prophylaxis: heparin injection 5,000 Units Start: 04/09/21 1400     Code Status: DNR  Family Communication: Son  present at bedside  Status is: Inpatient  Remains inpatient appropriate because: Ongoing goals of care  Level of care: Telemetry Medical  Consultants:  Palliative care  Cardiology  Neurology   Procedures:  2D echo: pending  Microbiology  none  Antimicrobials: none    Objective: Vitals:   04/11/21 0001 04/11/21 0428 04/11/21 0900 04/11/21 0936  BP: 97/66 125/78 135/89   Pulse: 88 82 84   Resp: 16 18    Temp: (!) 97.5 F (36.4 C) 98.2 F (36.8 C) (!) (P) 97.4 F (36.3 C) (!) 97.4 F (36.3 C)  TempSrc: Oral Oral (P) Oral Oral  SpO2: 92% 93% 94%   Weight:      Height:        Intake/Output Summary (Last 24 hours) at 04/11/2021 1019 Last data filed at 04/11/2021 0400 Gross per 24 hour  Intake 239.06 ml  Output 400 ml  Net -160.94 ml   Filed Weights   03/24/2021 1307 04/10/21 0500  Weight: 46.7 kg 45 kg    Examination: Constitutional: NAD Eyes: no scleral icterus ENMT: Mucous membranes are moist.  Neck: normal, supple Respiratory: clear to auscultation bilaterally, no wheezing, no crackles.  Cardiovascular: Regular rate and rhythm,  no murmurs / rubs / gallops. No LE edema.  Abdomen: Appears uncomfortable on palpation, no guarding or rebound.  Bowel sounds present Musculoskeletal: no clubbing / cyanosis.  Skin: no rashes Neurologic: Does not follow commands consistently but appears that the left arm is flaccid   Data Reviewed: I have independently reviewed following labs and imaging studies   CBC: Recent Labs  Lab 04/07/21 1704 04/08/21 0527 04/09/21 0141 04/10/21 0337 04/11/21 0327  WBC 10.5 10.0 9.2 8.1 7.2  NEUTROABS 7.5 7.3 7.1 6.0 5.3  HGB 12.2 11.3* 10.7* 10.8* 10.1*  HCT 41.9 39.2 37.2 38.3 34.9*  MCV 102.7* 104.8* 105.7* 105.5* 103.3*  PLT 342 285 247 220 161   Basic Metabolic Panel: Recent Labs  Lab 03/25/2021 2222 04/07/21 0133 04/07/21 1704 04/07/21 2155 04/08/21 0527 04/08/21 0840 04/09/21 0141 04/09/21 0508 04/09/21 1355  04/09/21 1717 04/10/21 0337 04/11/21 0327  NA  --    < > 135  --  138  --   --  140  --   --  139 137  K 5.6*   < > 5.8*   5.9*   < > 5.7*   < > 5.4* 5.2* 4.9 5.3* 5.2* 5.0  CL  --    < > 93*  --  94*  --   --  100  --   --  103 104  CO2  --    < > 33*  --  36*  --   --  36*  --   --  31 28  GLUCOSE  --    < > 141*  --  115*  --   --  109*  --   --  87 103*  BUN  --    < > 62*  --  66*  --   --  65*  --   --  58* 54*  CREATININE  --    < > 2.55*  --  2.58*  --   --  2.15*  --   --  1.95* 1.79*  CALCIUM  --    < > 8.4*  --  8.4*  --   --  8.9  --   --  8.7* 8.3*  MG 2.0  --  2.3  --  2.3  --  2.3  --   --   --  2.0 1.8  PHOS 7.5*  --  7.2*  --  5.9*  --  4.2  --   --   --   --   --    < > = values in this interval not displayed.   Liver Function Tests: Recent Labs  Lab 04/07/21 0621 04/07/21 1704 04/08/21 0527 04/09/21 0508 04/10/21 0337  AST 33 _0 ALT _1 ALKPHOS 110 103 90 74 66  BILITOT 0.6 0.6 0.5 0.6 0.7  PROT 6.6 6.6 6.4* 6.1* 5.7*  ALBUMIN 2.4* 2.4* 2.3* 2.3* 1.9*   Coagulation Profile: No results for input(s): INR, PROTIME in the last 168 hours. HbA1C: Recent Labs    04/11/21 0327  HGBA1C 6.3*   CBG: Recent Labs  Lab 04/07/21 1635 04/08/21 1153 04/08/21 1603 04/08/21 2117  GLUCAP 145* 135* 150* 128*    Recent Results (from the past 240 hour(s))  Resp Panel by RT-PCR (Flu A&B, Covid) Nasopharyngeal Swab     Status: None   Collection Time: 04/05/2021 12:59 PM   Specimen: Nasopharyngeal Swab; Nasopharyngeal(NP) swabs in vial transport medium  Result Value Ref Range Status   SARS Coronavirus 2 by RT PCR NEGATIVE NEGATIVE Final    Comment: (NOTE) SARS-CoV-2 target nucleic acids are NOT DETECTED.  The SARS-CoV-2 RNA is generally detectable in upper respiratory specimens during the acute phase of infection. The lowest concentration of SARS-CoV-2 viral copies this assay can detect is 138 copies/mL. A negative result does not preclude  SARS-Cov-2 infection and should not be used as the sole basis for treatment or other patient management decisions. A negative result may occur with  improper specimen collection/handling, submission of specimen other than nasopharyngeal swab, presence of viral mutation(s) within the areas targeted by this assay, and inadequate number of viral copies(<138 copies/mL). A negative result must be combined with clinical observations, patient history, and epidemiological information. The expected result is Negative.  Fact Sheet for Patients:  EntrepreneurPulse.com.au  Fact Sheet for Healthcare Providers:  IncredibleEmployment.be  This test is no t yet approved or cleared by the [place] FDA and  has been authorized for detection and/or diagnosis of SARS-CoV-2 by FDA under an Emergency Use Authorization (EUA). This EUA will remain  in effect (meaning this test can be used) for the duration of the COVID-19 declaration under Section 564(b)(1) of the Act, 21 U.S.C.section 360bbb-3(b)(1), unless the authorization is terminated  or revoked sooner.       Influenza A by PCR NEGATIVE NEGATIVE Final   Influenza B by PCR NEGATIVE NEGATIVE Final    Comment: (NOTE) The Xpert Xpress SARS-CoV-2/FLU/RSV plus assay is intended as an aid in the diagnosis of influenza from Nasopharyngeal swab specimens and should not be used as a sole basis for treatment. Nasal washings and aspirates are unacceptable for Xpert Xpress SARS-CoV-2/FLU/RSV testing.  Fact Sheet for Patients: EntrepreneurPulse.com.au  Fact Sheet for Healthcare Providers: IncredibleEmployment.be  This test is not yet approved or cleared by the [place] FDA and has been authorized for detection and/or diagnosis of SARS-CoV-2 by FDA under an Emergency Use Authorization (EUA). This EUA will remain in effect (meaning this test can be used) for the duration of  the COVID-19 declaration under Section 564(b)(1) of the Act, 21 U.S.C. section 360bbb-3(b)(1), unless the authorization is terminated or revoked.  Performed at [facility], [address], [place], [place] [address]   Culture, blood (routine x 2)     Status: None (Preliminary result)   Collection Time: 04/03/2021  5:30 PM   Specimen: BLOOD  Result Value Ref Range Status   Specimen Description   Final    BLOOD RIGHT ANTECUBITAL Performed at [facility], [address] [place], [place] [address]    Special Requests   Final    Blood Culture adequate volume BOTTLES DRAWN AEROBIC AND ANAEROBIC Performed at [facility], [address], [place], [place] [address]    Culture   Final    NO GROWTH 4 DAYS Performed at [facility], [place] [address], [place], [place] [address]    Report Status PENDING  Incomplete  Culture, blood (routine x 2)     Status: None (Preliminary result)   Collection Time: 03/24/2021 10:22 PM   Specimen: BLOOD  Result Value Ref Range Status   Specimen Description   Final    BLOOD RIGHT ANTECUBITAL Performed at [facility] [address] [place], [place] [address]    Special Requests   Final    BOTTLES DRAWN AEROBIC ONLY Blood Culture adequate volume Performed at [place] [facility] [address]  [place], [place] [address]    Culture   Final    NO GROWTH 3 DAYS Performed at [facility], [place] [address] [place], [place] [address]    Report Status PENDING  Incomplete  Urine Culture     Status: None   Collection Time: 04/07/21  6:07 PM   Specimen: Urine, Clean Catch  Result Value Ref Range Status   Specimen Description   Final    URINE, CLEAN CATCH Performed at [facility], [place] [address] [place], [place] [address]    Special Requests   Final    NONE Performed at [facility], [place] [address] [place],  [place] [address]    Culture   Final    NO GROWTH Performed at [facility], [place] [address], [place], [place] [address]    Report Status 04/09/2021 FINAL  Final  MRSA Next Gen by PCR, Nasal     Status: None   Collection Time: 04/08/21 11:30 AM   Specimen: Nasal Mucosa; Nasal Swab  Result Value Ref Range Status   MRSA by PCR Next Gen NOT DETECTED NOT DETECTED Final    Comment: (NOTE) The GeneXpert MRSA Assay (FDA approved for NASAL specimens only), is one component of a comprehensive MRSA colonization surveillance program. It is not intended to diagnose MRSA infection nor to guide or monitor treatment for MRSA infections. Test performance is not FDA approved in patients less than 6 years old. Performed at [facility], [place] [address], [place], [place] [address]      Radiology Studies: MR ANGIO HEAD WO CONTRAST  Result Date: 04/10/2021 CLINICAL DATA:  Acute neuro deficit.  Stroke. EXAM: MRI HEAD WITHOUT CONTRAST MRA HEAD WITHOUT CONTRAST MRA NECK WITHOUT CONTRAST TECHNIQUE: Multiplanar, multiecho pulse sequences of the brain and surrounding structures were obtained without intravenous contrast. Angiographic images of the Circle of Willis were obtained using MRA technique without intravenous contrast. Angiographic images of the neck were obtained using MRA technique without intravenous contrast. Carotid stenosis measurements (when applicable) are obtained utilizing NASCET criteria, using the distal internal carotid diameter as the denominator. COMPARISON:  CT head 04/09/2021 FINDINGS: MRI HEAD FINDINGS Brain: Multiple small areas of acute infarct are present in both cerebral hemispheres involving both frontal lobes and the left parietal lobe. Small acute infarcts in the cerebellum bilaterally. Brainstem intact. Negative for hemorrhage or mass. 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 Ventricle size normal. Mild chronic  microvascular ischemic change in the white matter and pons. Vascular: Normal arterial flow voids Skull and upper cervical spine: No focal skeletal lesion. Sinuses/Orbits: Paranasal sinuses clear. Bilateral cataract extraction Other: None MRA HEAD FINDINGS Internal carotid artery widely patent bilaterally. Anterior and middle cerebral arteries widely patent Normal posterior circulation. No stenosis or large vessel occlusion. Negative for aneurysm. MRA NECK FINDINGS Antegrade flow in the carotid and vertebral arteries bilaterally. No stenosis or occlusion identified. IMPRESSION: 1. Multiple small areas of acute infarct in the cerebral hemispheres bilaterally and in the cerebellum bilaterally. Probable emboli. 2. Negative MRA head 3. Negative MRA neck 4. No source of emboli identified in the head neck. Electronically Signed   By: Franchot Gallo M.D.   On: 04/10/2021 14:45   MR ANGIO NECK WO CONTRAST  Result Date: 04/10/2021 CLINICAL DATA:  Acute neuro deficit.  Stroke. EXAM: MRI HEAD WITHOUT CONTRAST MRA HEAD WITHOUT CONTRAST MRA NECK WITHOUT CONTRAST TECHNIQUE: Multiplanar, multiecho pulse sequences of the brain and surrounding structures  were obtained without intravenous contrast. Angiographic images of the Circle of Willis were obtained using MRA technique without intravenous contrast. Angiographic images of the neck were obtained using MRA technique without intravenous contrast. Carotid stenosis measurements (when applicable) are obtained utilizing NASCET criteria, using the distal internal carotid diameter as the denominator. COMPARISON:  CT head 04/09/2021 FINDINGS: MRI HEAD FINDINGS Brain: Multiple small areas of acute infarct are present in both cerebral hemispheres involving both frontal lobes and the left parietal lobe. Small acute infarcts in the cerebellum bilaterally. Brainstem intact. Negative for hemorrhage or mass. Cystic change in the choroid plexus bilaterally compatible with incidental  xanthogranulomatous. Ventricle size normal. Mild chronic microvascular ischemic change in the white matter and pons. Vascular: Normal arterial flow voids Skull and upper cervical spine: No focal skeletal lesion. Sinuses/Orbits: Paranasal sinuses clear. Bilateral cataract extraction Other: None MRA HEAD FINDINGS Internal carotid artery widely patent bilaterally. Anterior and middle cerebral arteries widely patent Normal posterior circulation. No stenosis or large vessel occlusion. Negative for aneurysm. MRA NECK FINDINGS Antegrade flow in the carotid and vertebral arteries bilaterally. No stenosis or occlusion identified. IMPRESSION: 1. Multiple small areas of acute infarct in the cerebral hemispheres bilaterally and in the cerebellum bilaterally. Probable emboli. 2. Negative MRA head 3. Negative MRA neck 4. No source of emboli identified in the head neck. Electronically Signed   By: Franchot Gallo M.D.   On: 04/10/2021 14:45   MR BRAIN WO CONTRAST  Result Date: 04/10/2021 CLINICAL DATA:  Acute neuro deficit.  Stroke. EXAM: MRI HEAD WITHOUT CONTRAST MRA HEAD WITHOUT CONTRAST MRA NECK WITHOUT CONTRAST TECHNIQUE: Multiplanar, multiecho pulse sequences of the brain and surrounding structures were obtained without intravenous contrast. Angiographic images of the Circle of Willis were obtained using MRA technique without intravenous contrast. Angiographic images of the neck were obtained using MRA technique without intravenous contrast. Carotid stenosis measurements (when applicable) are obtained utilizing NASCET criteria, using the distal internal carotid diameter as the denominator. COMPARISON:  CT head 04/09/2021 FINDINGS: MRI HEAD FINDINGS Brain: Multiple small areas of acute infarct are present in both cerebral hemispheres involving both frontal lobes and the left parietal lobe. Small acute infarcts in the cerebellum bilaterally. Brainstem intact. Negative for hemorrhage or mass. Cystic change in the choroid  plexus bilaterally compatible with incidental xanthogranulomatous. Ventricle size normal. Mild chronic microvascular ischemic change in the white matter and pons. Vascular: Normal arterial flow voids Skull and upper cervical spine: No focal skeletal lesion. Sinuses/Orbits: Paranasal sinuses clear. Bilateral cataract extraction Other: None MRA HEAD FINDINGS Internal carotid artery widely patent bilaterally. Anterior and middle cerebral arteries widely patent Normal posterior circulation. No stenosis or large vessel occlusion. Negative for aneurysm. MRA NECK FINDINGS Antegrade flow in the carotid and vertebral arteries bilaterally. No stenosis or occlusion identified. IMPRESSION: 1. Multiple small areas of acute infarct in the cerebral hemispheres bilaterally and in the cerebellum bilaterally. Probable emboli. 2. Negative MRA head 3. Negative MRA neck 4. No source of emboli identified in the head neck. Electronically Signed   By: Franchot Gallo M.D.   On: 04/10/2021 14:45     Marzetta Board, MD, PhD [facility]  Between 7 am - 7 pm I am available, please contact me via Amion (for emergencies) or Securechat (non urgent messages)  Between 7 pm - 7 am I am not available, please contact night coverage MD/APP via Amion

## 2021-04-11 NOTE — Progress Notes (Signed)
Subjective: Patient continues to be delirious  Exam: Vitals:   04/11/21 0001 04/11/21 0428  BP: 97/66 125/78  Pulse: 88 82  Resp: 16 18  Temp: (!) 97.5 F (36.4 C) 98.2 F (36.8 C)  SpO2: 92% 93%   Gen: In bed, NAD Resp: non-labored breathing, no acute distress Abd: soft, nt  Neuro: MS: Awake, able to answer some simple questions and follow simple commands.  She is not very cooperative. CN: Pupils reactive bilaterally, tracks across midline in both directions. Motor: She has a flaccid left arm, wiggles left toes to stimulation, moves right side much better Sensory: Response to noxious stimulation bilaterally  Pertinent results: B1 pending (was on a multivitamin prior to checking) B12 561  BUN 58, creatinine 1.95 Ammonia 43  A.m. cortisol 16.8 TSH 12.7 -> 7.2, free T4 1.21-> 0.63  RPR negative  EEG-moderate encephalopathy   Impression: 75 year old female with recent severe weight loss with delirium in the setting of AKI, hypothyroidism, abdominal pain, multifocal strokes. I am not sure the strokes are 100% of her MS change, but rather think it is multifactorial. We can interrogate her ICD to find out about atrial fibrillation.    With her recent weight loss, I am concerned for possibly Wernicke's encephalopathy as playing a role and have started her on high-dose thiamine.  Unclear how much the thiamine level will be helpful given she was started on multivitamin, a low level would be helpful but a normal level would not be very telling.  Though her strokes are not fatal, given her degree of failure to thrive and the decline that she is undergone since relocating to [place] last summer, with her further decline in quality of life I do think that it is reasonable to start considering palliative options and she is meeting with palliative care team.  With time, her delirium may clear some, so she may be better than she is currently, from this perspective but I suspect that her  deficits from the stroke are more likely to be persistent.   Recommendations: 1) Seroquel 25mg  q8pm to help restore day/night cycle 2) lights on during day/off at night.  3) continue ASA 4) PT/OT if she becomes cooperative enough 5) agree with palliative care consultation 6) interrogate ICD to check for atrial fibrillation 7) neurology to continue to follow   , MD [facility] [phone]  If 7pm- 7am, please page neurology on call as listed in AMION.

## 2021-04-11 NOTE — Evaluation (Signed)
Occupational Therapy Evaluation Patient Details Name: Stacy Sanchez MRN: [id number] DOB: [birth year] Today's Date: 04/11/2021   History of Present Illness Stacy Sanchez is a 75 y.o. female with medical history significant of bronchiectasis, CAD, CKD 3, hypertension, hypothyroidism, neuropathy, cytopenia, CHF, MAC presenting with ongoing abdominal pain. Patient lives in independent living at senior living center. 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   Clinical Impression   Stacy Sanchez was generally mod I prior to admission, living at an ALF. Upon evaluation pt was lethargic and following ~25% of commands throughout session with increased time and cues, pt with minimal verbalizations beyond "yes" and "no," which were said appropriately. Pt's LUE was flaccid throughout with increased edema noted in her hand, and her RUE is generally very weak as well. Overall pt required max-total A +2 for all bed mobility, sitting and ADLs assessed. She will benefit from OT acutely. Recommend SNF at d/c due to heavy assist levels.      Recommendations for follow up therapy are one component of a multi-disciplinary discharge planning process, led by the attending physician.  Recommendations may be updated based on patient status, additional functional criteria and insurance authorization.   Follow Up Recommendations  Skilled nursing-short term rehab (<3 hours/day)    Assistance Recommended at Discharge Frequent or constant Supervision/Assistance  Functional Status Assessment  Patient has had a recent decline in their functional status and/or demonstrates limited ability to make significant improvements in function in a reasonable and predictable amount of time  Equipment Recommendations  Other (comment) (defer to next venue)       Precautions / Restrictions Precautions Precautions: Fall Restrictions Weight Bearing  Restrictions: No      Mobility Bed Mobility Overal bed mobility: Needs Assistance Bed Mobility: Rolling;Sidelying to Sit;Sit to Sidelying Rolling: Max assist;+2 for physical assistance;+2 for safety/equipment Sidelying to sit: Total assist;+2 for physical assistance;+2 for safety/equipment     Sit to sidelying: Total assist;+2 for physical assistance;+2 for safety/equipment General bed mobility comments: very little volitional movement to attempt sitting up or moving in bed    Transfers Overall transfer level: Needs assistance                 General transfer comment: unsafe to attempt      Balance Overall balance assessment: Needs assistance Sitting-balance support: Feet supported Sitting balance-Leahy Scale: Zero               ADL either performed or assessed with clinical judgement   ADL Overall ADL's : Needs assistance/impaired Eating/Feeding: Maximal assistance;Bed level   Grooming: Maximal assistance;Bed level Grooming Details (indicate cue type and reason): hand over hand Upper Body Bathing: Maximal assistance;Bed level   Lower Body Bathing: +2 for physical assistance;+2 for safety/equipment;Bed level;Total assistance   Upper Body Dressing : Maximal assistance;Sitting   Lower Body Dressing: Total assistance;+2 for physical assistance;+2 for safety/equipment;Bed level   Toilet Transfer: Total assistance   Toileting- Clothing Manipulation and Hygiene: Total assistance       Functional mobility during ADLs: Total assistance;+2 for physical assistance;+2 for safety/equipment       Vision Baseline Vision/History: 1 Wears glasses Ability to See in Adequate Light: 0 Adequate Patient Visual Report: No change from baseline Additional Comments: diffcult to assess, not following commands for visual testing. blinks to threat, attends to therapist in all visual fields            Pertinent Vitals/Pain Pain Assessment: Faces  Faces Pain Scale: Hurts  even more Pain Location: generalized Pain Descriptors / Indicators: Grimacing;Restless;Moaning Pain Intervention(s): Limited activity within patient's tolerance;Monitored during session;Repositioned     Hand Dominance Right   Extremity/Trunk Assessment Upper Extremity Assessment Upper Extremity Assessment: Generalized weakness;LUE deficits/detail;RUE deficits/detail RUE Deficits / Details: unable to hold position against gravity. Very weak grip strength LUE Deficits / Details: flaccid, no trace activation noted. Swelling in L hand LUE Sensation: decreased light touch;decreased proprioception   Lower Extremity Assessment Lower Extremity Assessment: Defer to PT evaluation   Cervical / Trunk Assessment Cervical / Trunk Assessment: Kyphotic   Communication Communication Communication: Expressive difficulties   Cognition Arousal/Alertness: Lethargic;Suspect due to medications Behavior During Therapy: Flat affect Overall Cognitive Status: Impaired/Different from baseline Area of Impairment: Orientation;Awareness;Following commands;Safety/judgement;Memory;Problem solving                 Orientation Level: Disoriented to;Place;Time;Situation   Memory: Decreased short-term memory Following Commands: Follows one step commands inconsistently Safety/Judgement: Decreased awareness of safety;Decreased awareness of deficits   Problem Solving: Slow processing;Requires verbal cues;Requires tactile cues;Difficulty sequencing;Decreased initiation General Comments: pt following about 25% of commands with increased time and cues. She is very lethargic but saying "yes" and "no" appropriately. Pt recieved Ativan this am.     General Comments  VSS on 4L [place]            Home Living Family/patient expects to be discharged to:: Assisted living   Available Help at Discharge: Family;Available PRN/intermittently                   Home Equipment: Rolling Walker (2 wheels)    Additional Comments: Patient using walker at baseline per daughter      Prior Functioning/Environment Prior Level of Function : Independent/Modified Independent             Mobility Comments: uses walker ADLs Comments: pt's daughter states the pt was generally indep however she was "unsafe" doing so              OT Treatment/Interventions: Self-care/ADL training;Therapeutic exercise;Balance training;Patient/family education;Manual therapy;DME and/or AE instruction    OT Goals(Current goals can be found in the care plan section) Acute Rehab OT Goals Patient Stated Goal: unable to state OT Goal Formulation: With patient/family Time For Goal Achievement: 04/25/21 Potential to Achieve Goals: Fair ADL Goals Pt Will Perform Grooming: bed level;with set-up Pt Will Perform Upper Body Bathing: with supervision;bed level Pt Will Perform Upper Body Dressing: with min assist;sitting Pt Will Perform Lower Body Dressing: with mod assist;sit to/from stand Pt Will Transfer to Toilet: with mod assist;stand pivot transfer;bedside commode Additional ADL Goal #1: Pt will complete bed mobility with min A as a precursor to EOB ADLs Additional ADL Goal #2: Pt will tolerate at least 6 minutes of sitting EOB functional task with indep balance  OT Frequency: Min 2X/week    AM-PAC OT "6 Clicks" Daily Activity     Outcome Measure Help from another person eating meals?: Total Help from another person taking care of personal grooming?: A Lot Help from another person toileting, which includes using toliet, bedpan, or urinal?: Total Help from another person bathing (including washing, rinsing, drying)?: A Lot Help from another person to put on and taking off regular upper body clothing?: A Lot Help from another person to put on and taking off regular lower body clothing?: Total 6 Click Score: 9   End of Session Equipment Utilized During Treatment: Oxygen Nurse Communication: Mobility  status  Activity Tolerance:  Patient limited by lethargy Patient left: in bed;with call bell/phone within reach;with bed alarm set;with family/visitor present  OT Visit Diagnosis: Unsteadiness on feet (R26.81);Other abnormalities of gait and mobility (R26.89);Muscle weakness (generalized) (M62.81);Hemiplegia and hemiparesis Hemiplegia - Right/Left: Left Hemiplegia - dominant/non-dominant: Non-Dominant Hemiplegia - caused by: Cerebral infarction                Time: 5625-6389 OT Time Calculation (min): 23 min Charges:  OT General Charges $OT Visit: 1 Visit OT Evaluation $OT Eval Moderate Complexity: 1 Mod OT Treatments $Therapeutic Activity: 8-22 mins   Stacy Sanchez A Shacoya Burkhammer 04/11/2021, 3:52 PM

## 2021-04-11 NOTE — Plan of Care (Signed)

## 2021-04-11 NOTE — Progress Notes (Signed)
Patient family member at bedside states they do not need assistance with home CPAP setup.  Family aware RT is available if needed.

## 2021-04-11 NOTE — Progress Notes (Signed)
[facility] notifed bladder scan 343 mls

## 2021-04-11 NOTE — Progress Notes (Signed)
Speech Language Pathology Treatment: Dysphagia  Patient Details Name: Stacy Sanchez MRN: [id number] DOB: 1945-06-07 Today's Date: 04/11/2021 Time: 1510-1530 SLP Time Calculation (min) (ACUTE ONLY): 20 min  Assessment / Plan / Recommendation Clinical Impression  Pt seen for a f/u dysphagia tx with ice chips/min tsp amounts (1/3 tsp) with daughter present.  Oral care completed with pt requiring mod multimodal cues during completion.  Xerostomia present and pt lethargic impacting overall PO consumption.  Educated daughter re: safety with swallowing/intake utilizing slow rate/small bites/sips and with pt maintaining alertness levels with mod encouragement for continued PO intake.  Pt with decreased bolus preparation and oral holding with ice chips/thin via small tsp amounts (1/3 tsp) without overt s/s of aspiration present.  Pt speaking in short phrases with decreased intelligibility and low vocal intensity judged to be 50-75% intelligible.  Continue oral care QID with Dysphagia 2/thin liquids utilizing swallowing strategies.  ST will continue to f/u in acute setting for speech/dysphagia needs.    HPI HPI: 75 y.o. female with medical history significant of bronchiectasis, CAD, CKD 3, hypertension, hypothyroidism, neuropathy, cytopenia, CHF, MAC presenting with ongoing abdominal pain      SLP Plan  Continue with current plan of care;Other (Comment) (potential for objective assessment)      Recommendations for follow up therapy are one component of a multi-disciplinary discharge planning process, led by the attending physician.  Recommendations may be updated based on patient status, additional functional criteria and insurance authorization.    Recommendations  Diet recommendations: Dysphagia 2 (fine chop);Thin liquid Liquids provided via: Teaspoon Medication Administration: Crushed with puree Supervision: Full supervision/cueing for compensatory strategies;Staff to assist with self  feeding Compensations: Slow rate;Small sips/bites                Oral Care Recommendations: Oral care QID Follow Up Recommendations: Follow physician's recommendations for discharge plan and follow up therapies Assistance recommended at discharge: Frequent or constant Supervision/Assistance SLP Visit Diagnosis: Dysphagia, oropharyngeal phase (R13.12) Plan: Continue with current plan of care;Other (Comment) (potential for objective assessment)           Elvina Sidle, M.S., CCC-SLP  04/11/2021, 3:42 PM

## 2021-04-11 NOTE — Progress Notes (Signed)
Daily Progress Note   Patient Name: Stacy Sanchez       Date: 04/11/2021 DOB: [birth year]  Age: 75 y.o. MRN#: [id number] Attending Physician: Caren Griffins, MD Primary Care Physician: Burnett Sheng, MD Admit Date: 04/11/2021  Reason for Consultation/Follow-up: Establishing goals of care  Subjective: Patient is awake alert resting in bed, daughter Clarise Cruz is at bedside.   Length of Stay: 5  Current Medications: Scheduled Meds:   aspirin  81 mg Oral Daily   feeding supplement  237 mL Oral BID BM   heparin injection (subcutaneous)  5,000 Units Subcutaneous Q8H   levothyroxine  50 mcg Oral Once per day on Mon Tue Wed Thu Fri   And   levothyroxine  100 mcg Oral Once per day on Sun Sat   polyethylene glycol  17 g Oral Daily   QUEtiapine  25 mg Oral QHS   sodium chloride flush  3 mL Intravenous Q12H   [START ON 04/17/2021] thiamine injection  100 mg Intravenous Daily    Continuous Infusions:  doxycycline (VIBRAMYCIN) IV 100 mg (04/10/21 2354)   thiamine injection      PRN Meds: acetaminophen **OR** acetaminophen, haloperidol lactate, ipratropium, ipratropium-albuterol, levalbuterol, LORazepam, morphine injection, Muscle Rub, ondansetron (ZOFRAN) IV, polyethylene glycol  Physical Exam         Awake alert Mumbles some words has aphasia Has left-sided weakness Follows some commands She is alert  Vital Signs: BP 135/89    Pulse 84    Temp (!) 97.4 F (36.3 C) (Oral)    Resp 18    Ht _0  (1.727 m)    Wt 45 kg    SpO2 94%    BMI 15.08 kg/m  SpO2: SpO2: 94 % O2 Device: O2 Device: Nasal Cannula O2 Flow Rate: O2 Flow Rate (L/min): 2 L/min  Intake/output summary:  Intake/Output Summary (Last 24 hours) at 04/11/2021 8786 Last data filed at 04/11/2021 0400 Gross per 24 hour   Intake 239.06 ml  Output 400 ml  Net -160.94 ml    LBM: Last BM Date: 04/21/2021 Baseline Weight: Weight: 46.7 kg Most recent weight: Weight: 45 kg       Palliative Assessment/Data:    Flowsheet Rows    Flowsheet Row Most Recent Value  Intake Tab   Referral Department Hospitalist  Unit at Time of  Referral Med/Surg Unit  Palliative Care Primary Diagnosis Cancer  Date Notified 04/07/21  Palliative Care Type New Palliative care  Reason for referral Clarify Goals of Care  Date of Admission 03/23/2021  Date first seen by Palliative Care 04/10/21  # of days Palliative referral response time 3 Day(s)  # of days IP prior to Palliative referral 1  Clinical Assessment   Palliative Performance Scale Score 30%  Psychosocial & Spiritual Assessment   Palliative Care Outcomes   Patient/Family meeting held? Yes  Who was at the meeting? patient, daughter       Patient Active Problem List   Diagnosis Date Noted   Palliative care by specialist    Pain of upper abdomen    Unintentional weight loss    Adult failure to thrive 03/24/2021   Abnormal EKG 04/21/2021   Hyperkalemia 04/20/2021   Acute bronchitis 05/09/2020   Dysuria 05/09/2020   Nonischemic cardiomyopathy ([place]) 01/31/2020   Syncope, cardiogenic 01/29/2020   OSA on CPAP 11/09/2019   Altered mental status    AKI (acute kidney injury) ([place]) 07/14/2019   Thrombocytopenia ([place]) 07/14/2019   Hypothyroidism 81/04/7508   Acute metabolic encephalopathy 25/85/2778   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   Chronic respiratory failure with hypoxia ([place]) 12/07/2018   Bronchiectasis ([place]) 11/27/2018   Pseudomonas aeruginosa infection 11/27/2018   Thrush 11/27/2018   CAD (coronary artery disease) 11/27/2018   CKD (chronic kidney disease), stage III ([place]) 11/27/2018   Essential hypertension 11/27/2018   Generalized anxiety disorder 11/27/2018   Peripheral neuropathy 11/27/2018   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  Patient was admitted with acute kidney injury and palliative consultation was done.  Due to change in mental status neurology evaluation was done.  MRI which is pacemaker compatible was recommended as patient has ICD device.  Recommendations/Plan: Palliative medicine team continues to follow for monitoring hospital course and overall disease trajectory of illness as well as to engage with the patient's next of kin with regards to broad goals of care and disposition options. Today I met with daughter Clarise Cruz at bedside, along with internal medicine resident Dr Laverda Page. Clarise Cruz is tearful and informs me that the family wishes to proceed with ICD de activation, shifting towards comfort measures and will consider either residential hospice or memory care unit with hospice on discharge.  We talked about patient's current condition, possible prognosis and high likelihood of patient having ongoing decline and might be approaching end of life.  Plan: DNR DNI De activate ICD Monitor hospital course to make further decisions regarding best possible disposition recommendations.  PMT to follow.       Code Status:    Code Status Orders  (From admission, onward)           Start     Ordered   04/09/21 1320  Do not attempt resuscitation (DNR)  Continuous       Question Answer Comment  In the event of cardiac or respiratory ARREST Do not call a code blue   In the event of cardiac or  respiratory  ARREST Do not perform Intubation, CPR, defibrillation or ACLS   In the event of cardiac or respiratory ARREST Use medication by any route, position, wound care, and other measures to relive pain and suffering. May use oxygen, suction and manual treatment of airway obstruction as needed for comfort.      04/09/21 1319           Code Status History     Date Active Date Inactive Code Status Order ID Comments User Context   03/23/2021 2245 04/09/2021 1319 Full Code [id number]  Marcelyn Bruins, MD Inpatient   03/23/2020 0822 03/23/2020 1442 Full Code [id number]  Jolaine Artist, MD Inpatient   07/14/2019 1024 07/17/2019 2113 Full Code [id number]  Vianne Bulls, MD ED   11/27/2018 1836 12/01/2018 2020 Full Code [id number]  Karie Kirks, DO ED      Advance Directive Documentation    Flowsheet Row Most Recent Value  Type of Advance Directive Healthcare Power of Attorney  Pre-existing out of facility DNR order (yellow form or pink MOST form) --  "MOST" Form in Place? --       Prognosis:  Guarded   Discharge Planning: To Be Determined  Care plan was discussed with  patient, daughter Clarise Cruz at bedside, also corresponded with Dr. Cruzita Lederer [facility] MD and her cardiology specialists Dr Haroldine Laws via secure chat.  Thank you for allowing the Palliative Medicine Team to assist in the care of this patient.   Time In: 9 Time Out: 9.35 Total Time 35 Prolonged Time Billed  no       Greater than 50%  of this time was spent counseling and coordinating care related to the above assessment and plan.  Loistine Chance, MD  Please contact Palliative Medicine Team phone at [phone] for questions and concerns.

## 2021-04-11 NOTE — Progress Notes (Signed)
RT contacted/came to bedside and setup patients home CPAP with 4L O2 bleed in.  Patient VSS.  Rt will continue to monitor.

## 2021-04-12 LAB — CBC WITH DIFFERENTIAL/PLATELET
Abs Immature Granulocytes: 0.03 10*3/uL (ref 0.00–0.07)
Basophils Absolute: 0.1 10*3/uL (ref 0.0–0.1)
Basophils Relative: 1 %
Eosinophils Absolute: 0.1 10*3/uL (ref 0.0–0.5)
Eosinophils Relative: 2 %
HCT: 35.2 % — ABNORMAL LOW (ref 36.0–46.0)
Hemoglobin: 10.5 g/dL — ABNORMAL LOW (ref 12.0–15.0)
Immature Granulocytes: 1 %
Lymphocytes Relative: 14 %
Lymphs Abs: 0.9 10*3/uL (ref 0.7–4.0)
MCH: 30.6 pg (ref 26.0–34.0)
MCHC: 29.8 g/dL — ABNORMAL LOW (ref 30.0–36.0)
MCV: 102.6 fL — ABNORMAL HIGH (ref 80.0–100.0)
Monocytes Absolute: 0.5 10*3/uL (ref 0.1–1.0)
Monocytes Relative: 8 %
Neutro Abs: 4.9 10*3/uL (ref 1.7–7.7)
Neutrophils Relative %: 74 %
Platelets: 196 10*3/uL (ref 150–400)
RBC: 3.43 MIL/uL — ABNORMAL LOW (ref 3.87–5.11)
RDW: 16.6 % — ABNORMAL HIGH (ref 11.5–15.5)
WBC: 6.5 10*3/uL (ref 4.0–10.5)
nRBC: 0.3 % — ABNORMAL HIGH (ref 0.0–0.2)

## 2021-04-12 LAB — CULTURE, BLOOD (ROUTINE X 2)
Culture: NO GROWTH
Special Requests: ADEQUATE

## 2021-04-12 LAB — BASIC METABOLIC PANEL
Anion gap: 4 — ABNORMAL LOW (ref 5–15)
BUN: 50 mg/dL — ABNORMAL HIGH (ref 8–23)
CO2: 30 mmol/L (ref 22–32)
Calcium: 8.5 mg/dL — ABNORMAL LOW (ref 8.9–10.3)
Chloride: 107 mmol/L (ref 98–111)
Creatinine, Ser: 1.63 mg/dL — ABNORMAL HIGH (ref 0.44–1.00)
GFR, Estimated: 32 mL/min — ABNORMAL LOW (ref >60)
Glucose, Bld: 89 mg/dL (ref 70–99)
Potassium: 5.2 mmol/L — ABNORMAL HIGH (ref 3.5–5.1)
Sodium: 141 mmol/L (ref 135–145)

## 2021-04-12 LAB — MAGNESIUM: Magnesium: 1.8 mg/dL (ref 1.7–2.4)

## 2021-04-12 LAB — T3: T3, Total: 57 ng/dL — ABNORMAL LOW (ref 71–180)

## 2021-04-12 LAB — LACTATE DEHYDROGENASE: LDH: 112 U/L (ref 98–192)

## 2021-04-12 MED ORDER — QUETIAPINE FUMARATE 25 MG PO TABS
25.0000 mg | ORAL_TABLET | Freq: Once | ORAL | Status: AC
Start: 2021-04-12 — End: 2021-04-12
  Administered 2021-04-12: 23:00:00 25 mg via ORAL
  Filled 2021-04-12: qty 1

## 2021-04-12 MED ORDER — GABAPENTIN 300 MG PO CAPS
300.0000 mg | ORAL_CAPSULE | Freq: Two times a day (BID) | ORAL | Status: DC
  Administered 2021-04-12 – 2021-04-13 (×3): 300 mg via ORAL
  Filled 2021-04-12 (×3): qty 1

## 2021-04-12 NOTE — Progress Notes (Signed)
Need to change to comfort care with plan to discharge to residential hospice.  Neurology will be available on an as-needed basis, please call with any further questions or concerns.  Ritta Slot, MD [facility] [phone]  If 7pm- 7am, please page neurology on call as listed in AMION.

## 2021-04-12 NOTE — Progress Notes (Signed)
PROGRESS NOTE  Stacy Sanchez BJY:[id number] DOB: 1945/10/15 DOA: 03/25/2021 PCP: Burnett Sheng, MD   LOS: 6 days   Brief Narrative / Interim history: 75 year old female with history of bronchiectasis, CAD, CKD 3, HTN, hypothyroidism, MAC was admitted to the hospital with abdominal pain, waxing and waning over the last several days up to a week.  She has been having decreased p.o. intake as well as weight loss and was seeing GI as an outpatient but had to be rescheduled.  She was admitted to the hospital, was found to have acute kidney injury and hyperkalemia.  She had an episode of slurred speech, and an MRI done showed multiple embolic strokes and neurology was consulted.  She was transferred to [facility].  She has been intermittently confused  Subjective / 24h Interval events: Remains confused.  Does not communicate much, eyes are open  Assessment & Plan: Principal Problem Abdominal pain, FTT, dysphagia with anorexia, severe protein calorie malnutrition-patient has been having about a 30 pound weight loss in the last year and had very poor p.o. intake, was found to be dehydrated on admission.  BMI is 15.  A CT scan of the abdomen and pelvis without any significant acute findings.  GI consulted, they are entertaining an EGD however family decided not to pursue due to patient's deterioration/acute CVAs.  Supportive treatment for now.  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 Neurology consulted, discussed with Dr. Leonel Ramsay.  Continue aspirin, PT recommends SNF.  2D echo shows normal EF 60 to 65%, no WMA, grade 1 diastolic dysfunction.  RV was normal..  Lipid panel shows an LDL of 28.  A1c 6.3.  Underwent an EEG which showed moderate diffuse encephalopathy of nonspecific etiology without seizures or epileptiform discharges.  Goals of  care-ongoing discussions, I talked to the son this morning they are leaning towards more comfort approach.  They are debating whether she will go to residential hospice or to her facility's memory care unit with hospice following.  Given that she is not eating and drinking, life expectancy is less than 2 weeks and probably will be better off at residential hospice.  Palliative to follow again today  Probable pneumonia-on admission showed mild leukocytosis, and chest x-ray unable to rule out airspace disease.  She has been started on doxycycline, she is afebrile and WBC has normalized now, plan for 5-day course    Hyperkalemia -potassium improved, 5.0 this morning.  Repeat tomorrow  EKG abnormality with minimally elevated troponins, history of CAD-New T wave inversions and mildly elevated troponin. Troponin trend flat and likely in the setting of demand ischemia. Cardiology was consulted given patient's family's request and they suspect demand ischemia given no CAD on cath 01/30/2020.  Patient denies any chest pain but has chronic dyspnea and is progressively worsening in the setting of chronic MAI infection and bronchiectasis.  No further work-up per cards  Urinary retention-required several in and out caths, likely due to immobility.  Placed Foley 08/65  Acute metabolic encephalopathy -likely component of in-hospital delirium as well.  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  Due to poor p.o. intake and weight loss thiamine deficiency is a consideration and has been started on thiamine per neurology.   Chronic systolic CHF -Known history of CHF last echo was last month with EF 45-50% and grade 1 diastolic dysfunction.  Repeat 2D echo  pending.   Anxiety, depression -Reportedly on Remeron which will be resumed but now discontinued at the recommendation of neurology  History of VTE status post ICD-defibrillator turned off today per family wishes  Hypothyroidism -Continue home  Synthroid of 150 mcg    Hypertension with history of orthostatic hypotension -Her recent midodrine was stopped given her systolic blood pressures been elevated previously. Nadolol has been held on admission due to her softer blood pressure  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 by bronchiectasis with recent pseudomonal infection -Continue supplemental oxygen and normally wears 2 L during the day and 2 L at night -Palliative care consulted for further goals of care discussion and she is now DNR/DNI   AKI on CKD 3b - Creatinine stable at 1.37 on admission but worsened to up to 2.58 and is now trending down.    OSA -Continue home CPAP  Scheduled Meds:  aspirin  81 mg Oral Daily   Chlorhexidine Gluconate Cloth  6 each Topical Daily   feeding supplement  237 mL Oral BID BM   heparin injection (subcutaneous)  5,000 Units Subcutaneous Q8H   levothyroxine  50 mcg Oral Once per day on Mon Tue Wed Thu Fri   And   levothyroxine  100 mcg Oral Once per day on Sun Sat   polyethylene glycol  17 g Oral Daily   QUEtiapine  25 mg Oral QHS   sodium chloride flush  3 mL Intravenous Q12H   [START ON 04/17/2021] thiamine injection  100 mg Intravenous Daily   Continuous Infusions:  doxycycline (VIBRAMYCIN) IV 100 mg (04/11/21 2301)   thiamine injection 250 mg (04/12/21 0930)   PRN Meds:.acetaminophen **OR** acetaminophen, bisacodyl, haloperidol lactate, ipratropium, ipratropium-albuterol, levalbuterol, morphine injection, Muscle Rub, ondansetron (ZOFRAN) IV, polyethylene glycol  Diet Orders (From admission, onward)     Start     Ordered   04/10/21 1415  DIET DYS 2 Room service appropriate? Yes; Fluid consistency: Thin  Diet effective now       Question Answer Comment  Room service appropriate? Yes   Fluid consistency: Thin      04/10/21 1414            DVT prophylaxis: heparin injection 5,000 Units Start: 04/09/21 1400     Code Status: DNR  Family  Communication: Son present at bedside  Status is: Inpatient  Remains inpatient appropriate because: Ongoing goals of care  Level of care: Telemetry Medical  Consultants:  Palliative care  Cardiology  Neurology   Procedures:  2D echo: pending  Microbiology  none  Antimicrobials: none    Objective: Vitals:   04/11/21 1606 04/12/21 0007 04/12/21 0356 04/12/21 0736  BP: (!) 167/94 120/77 126/78 (!) 160/95  Pulse: 94 94 85 88  Resp: _0 Temp: 98.4 F (36.9 C) 97.9 F (36.6 C) 97.7 F (36.5 C)   TempSrc: Oral Oral Oral Oral  SpO2: 95% 97% 98% 97%  Weight:      Height:        Intake/Output Summary (Last 24 hours) at 04/12/2021 0931 Last data filed at 04/12/2021 0300 Gross per 24 hour  Intake --  Output 700 ml  Net -700 ml    Filed Weights   04/09/2021 1307 04/10/21 0500  Weight: 46.7 kg 45 kg    Examination: Constitutional: Cachectic, in no distress Eyes: NAD ENMT: Moist mucous membranes Neck: normal, supple Respiratory: CTA bilaterally, no wheezing or crackles Cardiovascular: Regular rate and rhythm, no murmurs Abdomen:  Nondistended, bowel sounds positive Musculoskeletal: no clubbing / cyanosis.  Skin: No new rashes Neurologic: No new focal deficits   Data Reviewed: I have independently reviewed following labs and imaging studies   CBC: Recent Labs  Lab 04/08/21 0527 04/09/21 0141 04/10/21 0337 04/11/21 0327 04/12/21 0210  WBC 10.0 9.2 8.1 7.2 6.5  NEUTROABS 7.3 7.1 6.0 5.3 4.9  HGB 11.3* 10.7* 10.8* 10.1* 10.5*  HCT 39.2 37.2 38.3 34.9* 35.2*  MCV 104.8* 105.7* 105.5* 103.3* 102.6*  PLT 285 247 220 198 481    Basic Metabolic Panel: Recent Labs  Lab 04/17/2021 2222 04/07/21 0133 04/07/21 1704 04/07/21 2155 04/08/21 0527 04/08/21 0840 04/09/21 0141 04/09/21 0508 04/09/21 1355 04/09/21 1717 04/10/21 0337 04/11/21 0327 04/12/21 0210  NA  --    < > 135  --  138  --   --  140  --   --  139 137 141  K 5.6*   < > 5.8*   5.9*    < > 5.7*   < > 5.4* 5.2* 4.9 5.3* 5.2* 5.0 5.2*  CL  --    < > 93*  --  94*  --   --  100  --   --  103 104 107  CO2  --    < > 33*  --  36*  --   --  36*  --   --  _0 GLUCOSE  --    < > 141*  --  115*  --   --  109*  --   --  87 103* 89  BUN  --    < > 62*  --  66*  --   --  65*  --   --  58* 54* 50*  CREATININE  --    < > 2.55*  --  2.58*  --   --  2.15*  --   --  1.95* 1.79* 1.63*  CALCIUM  --    < > 8.4*  --  8.4*  --   --  8.9  --   --  8.7* 8.3* 8.5*  MG 2.0  --  2.3  --  2.3  --  2.3  --   --   --  2.0 1.8 1.8  PHOS 7.5*  --  7.2*  --  5.9*  --  4.2  --   --   --   --   --   --    < > = values in this interval not displayed.    Liver Function Tests: Recent Labs  Lab 04/07/21 0621 04/07/21 1704 04/08/21 0527 04/09/21 0508 04/10/21 0337  AST 33 _1 ALT _2 ALKPHOS 110 103 90 74 66  BILITOT 0.6 0.6 0.5 0.6 0.7  PROT 6.6 6.6 6.4* 6.1* 5.7*  ALBUMIN 2.4* 2.4* 2.3* 2.3* 1.9*    Coagulation Profile: No results for input(s): INR, PROTIME in the last 168 hours. HbA1C: Recent Labs    04/11/21 0327  HGBA1C 6.3*    CBG: Recent Labs  Lab 04/07/21 1635 04/08/21 1153 04/08/21 1603 04/08/21 2117  GLUCAP 145* 135* 150* 128*     Recent Results (from the past 240 hour(s))  Resp Panel by RT-PCR (Flu A&B, Covid) Nasopharyngeal Swab     Status: None   Collection Time: 03/28/2021 12:59 PM   Specimen: Nasopharyngeal Swab; Nasopharyngeal(NP) swabs in vial transport medium  Result Value Ref Range Status  SARS Coronavirus 2 by RT PCR NEGATIVE NEGATIVE Final    Comment: (NOTE) SARS-CoV-2 target nucleic acids are NOT DETECTED.  The SARS-CoV-2 RNA is generally detectable in upper respiratory specimens during the acute phase of infection. The lowest concentration of SARS-CoV-2 viral copies this assay can detect is 138 copies/mL. A negative result does not preclude SARS-Cov-2 infection and should not be used as the sole basis for treatment or other  patient management decisions. A negative result may occur with  improper specimen collection/handling, submission of specimen other than nasopharyngeal swab, presence of viral mutation(s) within the areas targeted by this assay, and inadequate number of viral copies(<138 copies/mL). A negative result must be combined with clinical observations, patient history, and epidemiological information. The expected result is Negative.  Fact Sheet for Patients:  EntrepreneurPulse.com.au  Fact Sheet for Healthcare Providers:  IncredibleEmployment.be  This test is no t yet approved or cleared by the [place] FDA and  has been authorized for detection and/or diagnosis of SARS-CoV-2 by FDA under an Emergency Use Authorization (EUA). This EUA will remain  in effect (meaning this test can be used) for the duration of the COVID-19 declaration under Section 564(b)(1) of the Act, 21 U.S.C.section 360bbb-3(b)(1), unless the authorization is terminated  or revoked sooner.       Influenza A by PCR NEGATIVE NEGATIVE Final   Influenza B by PCR NEGATIVE NEGATIVE Final    Comment: (NOTE) The Xpert Xpress SARS-CoV-2/FLU/RSV plus assay is intended as an aid in the diagnosis of influenza from Nasopharyngeal swab specimens and should not be used as a sole basis for treatment. Nasal washings and aspirates are unacceptable for Xpert Xpress SARS-CoV-2/FLU/RSV testing.  Fact Sheet for Patients: EntrepreneurPulse.com.au  Fact Sheet for Healthcare Providers: IncredibleEmployment.be  This test is not yet approved or cleared by the [place] FDA and has been authorized for detection and/or diagnosis of SARS-CoV-2 by FDA under an Emergency Use Authorization (EUA). This EUA will remain in effect (meaning this test can be used) for the duration of the COVID-19 declaration under Section 564(b)(1) of the Act, 21 U.S.C. section  360bbb-3(b)(1), unless the authorization is terminated or revoked.  Performed at [facility], [address], [place], [place] [address]   Culture, blood (routine x 2)     Status: None   Collection Time: 04/09/2021  5:30 PM   Specimen: BLOOD  Result Value Ref Range Status   Specimen Description   Final    BLOOD RIGHT ANTECUBITAL Performed at [facility], [address] [place], [place] [address]    Special Requests   Final    Blood Culture adequate volume BOTTLES DRAWN AEROBIC AND ANAEROBIC Performed at [facility], [address] [place], [place] [address]    Culture   Final    NO GROWTH 5 DAYS Performed at [facility], [place] [address], [place], [place] [address]    Report Status 04/11/2021 FINAL  Final  Culture, blood (routine x 2)     Status: None   Collection Time: 04/17/2021 10:22 PM   Specimen: BLOOD  Result Value Ref Range Status   Specimen Description   Final    BLOOD RIGHT ANTECUBITAL Performed at [facility] [address] [place], [place] [address]    Special Requests   Final    BOTTLES DRAWN AEROBIC ONLY Blood Culture adequate volume Performed at [facility] [address], [place], [place] [address]    Culture   Final  NO GROWTH 5 DAYS Performed at [facility], [place] [address], [place], [place] [address]    Report Status 04/12/2021 FINAL  Final  Urine Culture     Status: None   Collection Time: 04/07/21  6:07 PM   Specimen: Urine, Clean Catch  Result Value Ref Range Status   Specimen Description   Final    URINE, CLEAN CATCH Performed at [facility], [place] [address], [place], [place] [address]    Special Requests   Final    NONE Performed at [facility], [place] [address] [place], [place] [address]    Culture   Final    NO GROWTH Performed at [facility], [place] [address], [place], [place]  [address]    Report Status 04/09/2021 FINAL  Final  MRSA Next Gen by PCR, Nasal     Status: None   Collection Time: 04/08/21 11:30 AM   Specimen: Nasal Mucosa; Nasal Swab  Result Value Ref Range Status   MRSA by PCR Next Gen NOT DETECTED NOT DETECTED Final    Comment: (NOTE) The GeneXpert MRSA Assay (FDA approved for NASAL specimens only), is one component of a comprehensive MRSA colonization surveillance program. It is not intended to diagnose MRSA infection nor to guide or monitor treatment for MRSA infections. Test performance is not FDA approved in patients less than 64 years old. Performed at [facility], [place] [address], [place], [place] [address]       Radiology Studies: ECHOCARDIOGRAM COMPLETE  Result Date: 04/11/2021    ECHOCARDIOGRAM REPORT   Patient Name:   MAYSIE PARKHILL Date of Exam: 04/11/2021 Medical Rec #:  [id number]     Height:       68.0 in Accession #:    [id number]    Weight:       99.2 lb Date of Birth:  18-Oct-1945     BSA:          1.517 m Patient Age:    1 years      BP:           143/89 mmHg Patient Gender: F             HR:           90 bpm. Exam Location:  Inpatient Procedure: 2D Echo, Cardiac Doppler and Color Doppler Indications:    Stroke  History:        Patient has prior history of Echocardiogram examinations, most                 recent 03/02/2021. CAD, Abnormal ECG, Signs/Symptoms:Syncope;                 Risk Factors:Hypertension.  Sonographer:    Glo Herring Referring Phys: Cecil  1. Left ventricular ejection fraction, by estimation, is 60 to 65%. The left ventricle has normal function. The left ventricle has no regional wall motion abnormalities. Left ventricular diastolic parameters are consistent with Grade I diastolic dysfunction (impaired relaxation).  2. The right ventricular apex contracts normally, while the free wall is hypokinetic (McConnell's sign). Consider pulmonary embolism. Right ventricular systolic  function is mildly reduced. The right ventricular size is mildly enlarged. Tricuspid regurgitation signal is inadequate for assessing PA pressure.  3. A small pericardial effusion is present. The pericardial effusion is localized near the right ventricle. There is no evidence of cardiac tamponade.  4. The mitral valve is myxomatous. No evidence of mitral  valve regurgitation. There is mild late systolic prolapse of of the mitral valve.  5. The tricuspid valve is myxomatous.  6. The aortic valve is tricuspid. Aortic valve regurgitation is mild to moderate. No aortic stenosis is present.  7. The inferior vena cava is dilated in size with <50% respiratory variability, suggesting right atrial pressure of 15 mmHg. Comparison(s): Prior images reviewed side by side. The left ventricular function has improved. The right ventricular systolic function is worse. There is increased PA pressure, increased RA pressure and a new small pericardial effusion. Consider acute cor pulmonale (e.g. acute pulmonary embolism). FINDINGS  Left Ventricle: Left ventricular ejection fraction, by estimation, is 60 to 65%. The left ventricle has normal function. The left ventricle has no regional wall motion abnormalities. The left ventricular internal cavity size was normal in size. There is  borderline concentric left ventricular hypertrophy. Left ventricular diastolic parameters are consistent with Grade I diastolic dysfunction (impaired relaxation). Indeterminate filling pressures. Right Ventricle: The right ventricular apex contracts normally, while the free wall is hypokinetic (McConnell's sign). Consider pulmonary embolism. The right ventricular size is mildly enlarged. No increase in right ventricular wall thickness. Right ventricular systolic function is mildly reduced. Tricuspid regurgitation signal is inadequate for assessing PA pressure. The tricuspid regurgitant velocity is 2.62 m/s, and with an assumed right atrial pressure of 15  mmHg, the estimated right ventricular  systolic pressure is 02.5 mmHg. Left Atrium: Left atrial size was normal in size. Right Atrium: Right atrial size was normal in size. Pericardium: A small pericardial effusion is present. The pericardial effusion is localized near the right ventricle. There is no evidence of cardiac tamponade. Mitral Valve: The mitral valve is myxomatous. There is mild late systolic prolapse of of the mitral valve. No evidence of mitral valve regurgitation. Tricuspid Valve: The tricuspid valve is myxomatous. Tricuspid valve regurgitation is not demonstrated. Aortic Valve: The aortic valve is tricuspid. Aortic valve regurgitation is mild to moderate. No aortic stenosis is present. Aortic valve mean gradient measures 3.0 mmHg. Aortic valve peak gradient measures 5.9 mmHg. Aortic valve area, by VTI measures 2.46 cm. Pulmonic Valve: The pulmonic valve was normal in structure. Pulmonic valve regurgitation is not visualized. Aorta: The aortic root and ascending aorta are structurally normal, with no evidence of dilitation. Venous: The inferior vena cava is dilated in size with less than 50% respiratory variability, suggesting right atrial pressure of 15 mmHg. IAS/Shunts: No atrial level shunt detected by color flow Doppler.  LEFT VENTRICLE PLAX 2D LVIDd:         3.70 cm   Diastology LVIDs:         2.40 cm   LV e' medial:    4.03 cm/s LV PW:         1.20 cm   LV E/e' medial:  13.8 LV IVS:        1.20 cm   LV e' lateral:   5.33 cm/s LVOT diam:     1.90 cm   LV E/e' lateral: 10.5 LV SV:         49 LV SV Index:   32 LVOT Area:     2.84 cm  IVC IVC diam: 2.20 cm LEFT ATRIUM         Index LA diam:    2.30 cm 1.52 cm/m  AORTIC VALVE AV Area (Vmax):    2.23 cm AV Area (Vmean):   2.23 cm AV Area (VTI):     2.46 cm AV Vmax:  121.00 cm/s AV Vmean:          78.500 cm/s AV VTI:            0.198 m AV Peak Grad:      5.9 mmHg AV Mean Grad:      3.0 mmHg LVOT Vmax:         95.30 cm/s LVOT Vmean:         61.800 cm/s LVOT VTI:          0.172 m LVOT/AV VTI ratio: 0.87  AORTA Ao Root diam: 3.50 cm Ao Asc diam:  3.50 cm MITRAL VALVE               TRICUSPID VALVE MV Area (PHT): 5.46 cm    TR Peak grad:   27.4 mmHg MV Decel Time: 139 msec    TR Vmax:        261.70 cm/s MV E velocity: 55.70 cm/s MV A velocity: 97.70 cm/s  SHUNTS MV E/A ratio:  0.57        Systemic VTI:  0.17 m                            Systemic Diam: 1.90 cm Dani Gobble Croitoru MD Electronically signed by Sanda Klein MD Signature Date/Time: 04/11/2021/1:57:33 PM    Final      Marzetta Board, MD, PhD [facility]  Between 7 am - 7 pm I am available, please contact me via Amion (for emergencies) or Securechat (non urgent messages)  Between 7 pm - 7 am I am not available, please contact night coverage MD/APP via Amion

## 2021-04-12 NOTE — Progress Notes (Signed)
Orthopedic Tech Progress Note Patient Details:  Stacy Sanchez January 19, 1946 [id number]  Patient ID: Stacy Sanchez, female   DOB: 02/22/1946, 75 y.o.   MRN: [id number]  Stacy Sanchez Stacy Sanchez 04/12/2021, 1:02 PM Spoke with RN about calling materials for the prevalon boots.

## 2021-04-12 NOTE — Progress Notes (Signed)
[id number]   [facility]) Hospital Liaison Note  Referral received for [facility]. [facility] will evaluate.   Please call with any questions.   Lynder Parents [facility] Liaison  [phone]

## 2021-04-12 NOTE — Progress Notes (Signed)
Nutrition Follow-up  DOCUMENTATION CODES:   Severe malnutrition in context of chronic illness, Underweight  INTERVENTION:  Monitor for ongoing [place] discussions -- if pt is transitioned to comfort focused care, then food should be a source of comfort for pt and should be offered but not forced; otherwise, pt will need nutrition support to sustain life unless mentation/lethargy/PO intake improves.  May continue orders for Ensure Enlive po BID, each supplement provides 350 kcal and 20 grams of protein, but this should continue to be held when pt is too lethargic to consume safely  NUTRITION DIAGNOSIS:   Severe Malnutrition related to chronic illness (CHF) as evidenced by severe muscle depletion, severe fat depletion.  updated  GOAL:   Patient will meet greater than or equal to 90% of their needs  Not met  MONITOR:   Supplement acceptance, Labs, PO intake, Weight trends, I & O's  REASON FOR ASSESSMENT:   Consult Assessment of nutrition requirement/status  ASSESSMENT:   75 year old chronically ill-appearing cachectic and thin Caucasian female with past medical history significant for but not limited to bronchiectasis, CAD, CKD stage III, hypertension, hypothyroidism, neuropathy, cytopenia, CHF, MAC as well as other comorbidities who presents with ongoing abdominal pain.  Pt sleeping at time of RD visit and did not wake to RD voice/touch. Discussed pt with RN who reports pt has been lethargic and difficult to feed as a result. Today, RN reports being unable to get pt to accept any POs 2/2 lethargy. PMT is following and pt/family have declined nutrition support and are leaning towards transitioning to comfort care, but have not yet made the official switch. Per MD, family is debating whether pt will go to residential hospice or to her's facility's memory care unit w/ hospice following. MD expects pt has prognosis of <2 weeks given pt is not eating/drinking.   PO Intake: 10-100% x5  recorded meals ( 42% avg meal intake) Note no PO intake documented since 12/18  UOP: 740m x24 hours I/O: +17358msince admit  Medications: Scheduled Meds:  aspirin  81 mg Oral Daily   Chlorhexidine Gluconate Cloth  6 each Topical Daily   feeding supplement  237 mL Oral BID BM   heparin injection (subcutaneous)  5,000 Units Subcutaneous Q8H   levothyroxine  50 mcg Oral Once per day on Mon Tue Wed Thu Fri   And   levothyroxine  100 mcg Oral Once per day on Sun Sat   polyethylene glycol  17 g Oral Daily   QUEtiapine  25 mg Oral QHS   sodium chloride flush  3 mL Intravenous Q12H   [START ON 04/17/2021] thiamine injection  100 mg Intravenous Daily  Continuous Infusions:  doxycycline (VIBRAMYCIN) IV 100 mg (04/12/21 1041)   thiamine injection 250 mg (04/12/21 0930)   Labs: Recent Labs  Lab 04/07/21 1704 04/07/21 2155 04/08/21 0527 04/08/21 0840 04/09/21 0141 04/09/21 0508 04/10/21 0337 04/11/21 0327 04/12/21 0210  NA 135  --  138  --   --    < > 139 137 141  K 5.8*   5.9*   < > 5.7*   < > 5.4*   < > 5.2* 5.0 5.2*  CL 93*  --  94*  --   --    < > 103 104 107  CO2 33*  --  36*  --   --    < > _0 BUN 62*  --  66*  --   --    < > 58*  54* 50*  CREATININE 2.55*  --  2.58*  --   --    < > 1.95* 1.79* 1.63*  CALCIUM 8.4*  --  8.4*  --   --    < > 8.7* 8.3* 8.5*  MG 2.3  --  2.3  --  2.3  --  2.0 1.8 1.8  PHOS 7.2*  --  5.9*  --  4.2  --   --   --   --   GLUCOSE 141*  --  115*  --   --    < > 87 103* 89   < > = values in this interval not displayed.    NUTRITION - FOCUSED PHYSICAL EXAM: Flowsheet Row Most Recent Value  Orbital Region Severe depletion  Upper Arm Region Severe depletion  Thoracic and Lumbar Region Severe depletion  Buccal Region Severe depletion  Temple Region Severe depletion  Clavicle Bone Region Severe depletion  Clavicle and Acromion Bone Region Severe depletion  Scapular Bone Region Severe depletion  Dorsal Hand Severe depletion  Patellar Region  Severe depletion  Anterior Thigh Region Severe depletion  Posterior Calf Region Severe depletion  Edema (RD Assessment) None  Hair Reviewed  Eyes Unable to assess  Mouth Unable to assess  Skin Reviewed  Nails Reviewed      Diet Order:   Diet Order             DIET DYS 2 Room service appropriate? Yes; Fluid consistency: Thin  Diet effective now                  EDUCATION NEEDS:  No education needs have been identified at this time  Skin:  Skin Assessment: Reviewed RN Assessment  Last BM:  12/22  Height:  Ht Readings from Last 1 Encounters:  03/30/2021 _0  (1.727 m)   Weight:  Wt Readings from Last 1 Encounters:  04/10/21 45 kg   BMI:  Body mass index is 15.08 kg/m.  Estimated Nutritional Needs:  Kcal:  1700-1900 Protein:  80-90g Fluid:  1.7L/day     Theone Stanley., MS, RD, LDN (she/her/hers) RD pager number and weekend/on-call pager number located in [place].

## 2021-04-12 NOTE — Progress Notes (Signed)
Physical Therapy Treatment Patient Details Name: Stacy Sanchez MRN: [id number] DOB: 21-Jan-1946 Today's Date: 04/12/2021   History of Present Illness Stacy Sanchez is a 75 y.o. female with medical history significant of bronchiectasis, CAD, CKD 3, hypertension, hypothyroidism, neuropathy, cytopenia, CHF, MAC presenting with ongoing abdominal pain. [facility]. 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    PT Comments    Pt is making some progress with her sitting balance, wavering from requiring mod-maxA today. However, she displays delayed reactional strategies and difficulty maintaining attention and following simple multi-modal commands consistently. Facilitated core activation and L UE muscular activation through sitting EOB and propping onto L elbow, cuing pt to push up from L elbow to hand to ascend trunk several reps. Good initiation noted with pushing up to sit from L elbow, but pt with difficulty isolating movements when cued, especially in her lower extremities. She continues to require TA x2 for all bed mobility and is stating short phrases to express her needs at this time, like "I'm cold" or "I feel sick". Will continue to follow acutely. Current recommendations remain appropriate.    Recommendations for follow up therapy are one component of a multi-disciplinary discharge planning process, led by the attending physician.  Recommendations may be updated based on patient status, additional functional criteria and insurance authorization.  Follow Up Recommendations  Skilled nursing-short term rehab (<3 hours/day)     Assistance Recommended at Discharge Frequent or constant Supervision/Assistance  Equipment Recommendations  Other (comment) (TBD)    Recommendations for Other Services       Precautions / Restrictions Precautions Precautions:  Fall Restrictions Weight Bearing Restrictions: No     Mobility  Bed Mobility Overal bed mobility: Needs Assistance Bed Mobility: Supine to Sit;Sit to Supine     Supine to sit: Total assist;+2 for physical assistance;+2 for safety/equipment;HOB elevated Sit to supine: Total assist;+2 for physical assistance;+2 for safety/equipment;HOB elevated   General bed mobility comments: Multi-modal cues and extra time provided to attempt to get pt to actively assist with bringing legs off EOB, but no success. TAx2 for all bed mobility aspects.    Transfers                   General transfer comment: Unable at this time    Ambulation/Gait               General Gait Details: Unable at this time   Stairs             Wheelchair Mobility    Modified Rankin (Stroke Patients Only) Modified Rankin (Stroke Patients Only) Pre-Morbid Rankin Score: Moderate disability Modified Rankin: Severe disability     Balance Overall balance assessment: Needs assistance Sitting-balance support: Feet supported;Single extremity supported;Bilateral upper extremity supported;No upper extremity supported Sitting balance-Leahy Scale: Poor Sitting balance - Comments: Pt with delayed and sometimes absent reactional strategies, with trunk sway in all directions, varying from requiring mod-maxA to sit statically EOB. Tactile and verbal cues provided to facilitate anterior pelvic tilt, scapular restraction, and trunk extension. Mod-maxA to support pt in propping on L elbow sitting EOB for brief moments, x3 reps.       Standing balance comment: Deferred                            Cognition Arousal/Alertness: Lethargic;Awake/alert (able to wake up with  mobility) Behavior During Therapy: Flat affect Overall Cognitive Status: Impaired/Different from baseline Area of Impairment: Awareness;Following commands;Safety/judgement;Memory;Problem solving;Attention                    Current Attention Level: Focused Memory: Decreased short-term memory Following Commands: Follows one step commands inconsistently;Follows one step commands with increased time Safety/Judgement: Decreased awareness of safety;Decreased awareness of deficits Awareness: Intellectual Problem Solving: Slow processing;Requires verbal cues;Requires tactile cues;Difficulty sequencing;Decreased initiation General Comments: pt following about 25% of commands with increased time and multi-modal cues. Pt lethargic, but able to easily be woken up with bed mobility. Pt able to express herself in regards to concerns, stating "I'm cold" or "I feel sick", but inconsistent with answering questions. Difficult to maintain her attention > a few seconds at a time. Poor insight into her deficits and safety with delayed reactional strategies.        Exercises Other Exercises Other Exercises: Facilitating L UE muscle activation through weight bearing propped onto L elbow sitting EOB with L shoulder supported by PT, mod-maxA for balance, cuing pt to push up from elbow to hand on L with good initiation by pt, x3 reps Other Exercises: Cues provided to perform LAQs bil but pt not following these commands Other Exercises: Elevated L UE on pillows when supine end of session to reduce edema    General Comments General comments (skin integrity, edema, etc.): VSS on supplemental O2; ordered prevalon boots bil      Pertinent Vitals/Pain Pain Assessment: Faces Faces Pain Scale: Hurts a little bit Pain Location: generalized with bed mobility Pain Descriptors / Indicators: Discomfort;Grimacing;Guarding;Moaning Pain Intervention(s): Limited activity within patient's tolerance;Monitored during session;Repositioned    Home Living                          Prior Function            PT Goals (current goals can now be found in the care plan section) Acute Rehab PT Goals Patient Stated Goal: to not feel sick PT  Goal Formulation: With patient/family Time For Goal Achievement: 04/24/21 Potential to Achieve Goals: Fair Progress towards PT goals: Progressing toward goals    Frequency    Min 3X/week      PT Plan Current plan remains appropriate    Co-evaluation              AM-PAC PT "6 Clicks" Mobility   Outcome Measure  Help needed turning from your back to your side while in a flat bed without using bedrails?: Total Help needed moving from lying on your back to sitting on the side of a flat bed without using bedrails?: Total Help needed moving to and from a bed to a chair (including a wheelchair)?: Total Help needed standing up from a chair using your arms (e.g., wheelchair or bedside chair)?: Total Help needed to walk in hospital room?: Total Help needed climbing 3-5 steps with a railing? : Total 6 Click Score: 6    End of Session Equipment Utilized During Treatment: Oxygen Activity Tolerance: Patient limited by lethargy;Patient limited by fatigue Patient left: in bed;with call bell/phone within reach;with bed alarm set Nurse Communication: Mobility status PT Visit Diagnosis: Muscle weakness (generalized) (M62.81);Other abnormalities of gait and mobility (R26.89);Difficulty in walking, not elsewhere classified (R26.2)     Time: 7672-0947 PT Time Calculation (min) (ACUTE ONLY): 22 min  Charges:  $Neuromuscular Re-education: 8-22 mins  Moishe Spice, PT, DPT Acute Rehabilitation Services  Pager: [phone] Office: [place] 04/12/2021, 12:59 PM

## 2021-04-12 NOTE — Plan of Care (Signed)

## 2021-04-12 NOTE — Progress Notes (Signed)
Daily Progress Note   Patient Name: Stacy Sanchez       Date: 04/12/2021 DOB: 1945-07-12  Age: 75 y.o. MRN#: [id number] Attending Physician: Caren Griffins, MD Primary Care Physician: Burnett Sheng, MD Admit Date: 04/12/2021  Reason for Consultation/Follow-up: Establishing goals of care  Subjective: Patient is resting in bed, appears weak, attempts to answer some questions, minimal PO intake, ongoing generalized weakness.     Length of Stay: 6  Current Medications: Scheduled Meds:   aspirin  81 mg Oral Daily   Chlorhexidine Gluconate Cloth  6 each Topical Daily   feeding supplement  237 mL Oral BID BM   heparin injection (subcutaneous)  5,000 Units Subcutaneous Q8H   levothyroxine  50 mcg Oral Once per day on Mon Tue Wed Thu Fri   And   levothyroxine  100 mcg Oral Once per day on Sun Sat   polyethylene glycol  17 g Oral Daily   QUEtiapine  25 mg Oral QHS   sodium chloride flush  3 mL Intravenous Q12H   [START ON 04/17/2021] thiamine injection  100 mg Intravenous Daily    Continuous Infusions:  doxycycline (VIBRAMYCIN) IV 100 mg (04/12/21 1041)   thiamine injection 250 mg (04/12/21 0930)    PRN Meds: acetaminophen **OR** acetaminophen, bisacodyl, haloperidol lactate, ipratropium, ipratropium-albuterol, levalbuterol, morphine injection, Muscle Rub, ondansetron (ZOFRAN) IV, polyethylene glycol  Physical Exam         Awakens easily Verbalizes some how ever some words has aphasia Has left-sided weakness Follows some commands  Vital Signs: BP (!) 158/89 (BP Location: Right Arm)    Pulse 89    Temp 98.3 F (36.8 C) (Oral)    Resp 19    Ht _0  (1.727 m)    Wt 45 kg    SpO2 100%    BMI 15.08 kg/m  SpO2: SpO2: 100 % O2 Device: O2 Device: Nasal Cannula O2 Flow Rate: O2  Flow Rate (L/min): 4 L/min  Intake/output summary:  Intake/Output Summary (Last 24 hours) at 04/12/2021 1453 Last data filed at 04/12/2021 1142 Gross per 24 hour  Intake --  Output 900 ml  Net -900 ml    LBM: Last BM Date: 04/12/21 Baseline Weight: Weight: 46.7 kg Most recent weight: Weight: 45 kg       Palliative Assessment/Data:  Flowsheet Rows    Flowsheet Row Most Recent Value  Intake Tab   Referral Department Hospitalist  Unit at Time of Referral Med/Surg Unit  Palliative Care Primary Diagnosis Cancer  Date Notified 04/07/21  Palliative Care Type New Palliative care  Reason for referral Clarify Goals of Care  Date of Admission 03/30/2021  Date first seen by Palliative Care 04/10/21  # of days Palliative referral response time 3 Day(s)  # of days IP prior to Palliative referral 1  Clinical Assessment   Palliative Performance Scale Score 30%  Psychosocial & Spiritual Assessment   Palliative Care Outcomes   Patient/Family meeting held? Yes  Who was at the meeting? patient, daughter       Patient Active Problem List   Diagnosis Date Noted   Failure to thrive in adult    Palliative care by specialist    Pain of upper abdomen    Unintentional weight loss    Adult failure to thrive 03/30/2021   Abnormal EKG 03/26/2021   Hyperkalemia 04/05/2021   Acute bronchitis 05/09/2020   Dysuria 05/09/2020   Nonischemic cardiomyopathy ([place]) 01/31/2020   Syncope, cardiogenic 01/29/2020   OSA on CPAP 11/09/2019   Altered mental status    AKI (acute kidney injury) ([place]) 07/14/2019   Thrombocytopenia ([place]) 07/14/2019   Hypothyroidism 76/28/3151   Acute metabolic encephalopathy 76/16/0737   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   Chronic respiratory failure with hypoxia ([place]) 12/07/2018   Bronchiectasis ([place]) 11/27/2018   Pseudomonas aeruginosa infection 11/27/2018   Thrush 11/27/2018   CAD (coronary artery disease) 11/27/2018   CKD (chronic kidney disease), stage III ([place]) 11/27/2018    Essential hypertension 11/27/2018   Generalized anxiety disorder 11/27/2018   Peripheral neuropathy 11/27/2018   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  Patient was admitted with acute kidney injury and palliative consultation was done.  Due to change in mental status neurology evaluation was done.  MRI which is pacemaker compatible was recommended as patient has ICD device.  Recommendations/Plan: Goals of care discussions family meeting held, with [facility] colleague Ms Ward Chatters, also checked in with Dr Cruzita Lederer about appropriate disposition options.  Discussed with patient's son and 2 daughters, goals of care and disposition options undertaken.  We talked about patient's current condition. Brief life review also performed, patient is a former Therapist, sports, she was a Government social research officer, taught piano lessons, her husband died of dementia few years ago. We talked about memory care unit with hospice versus residential hospice in detail.  Plan: Residential hospice.  Prognosis likely less than 2 weeks.       Code Status:    Code Status Orders  (From admission, onward)           Start     Ordered   04/09/21 1320  Do not attempt resuscitation (DNR)  Continuous       Question Answer Comment  In the event of cardiac or respiratory ARREST Do not call a code blue   In the event of cardiac or respiratory ARREST Do not perform Intubation, CPR, defibrillation  or ACLS   In the  event of cardiac or respiratory ARREST Use medication by any route, position, wound care, and other measures to relive pain and suffering. May use oxygen, suction and manual treatment of airway obstruction as needed for comfort.      04/09/21 1319           Code Status History     Date Active Date Inactive Code Status Order ID Comments User Context   04/02/2021 2245 04/09/2021 1319 Full Code [id number]  Marcelyn Bruins, MD Inpatient   03/23/2020 0822 03/23/2020 1442 Full Code [id number]  Jolaine Artist, MD Inpatient   07/14/2019 1024 07/17/2019 2113 Full Code [id number]  Vianne Bulls, MD ED   11/27/2018 1836 12/01/2018 2020 Full Code [id number]  Karie Kirks, DO ED      Advance Directive Documentation    Flowsheet Row Most Recent Value  Type of Advance Directive Healthcare Power of Attorney  Pre-existing out of facility DNR order (yellow form or pink MOST form) --  "MOST" Form in Place? --       Prognosis:  Likely less than 2 weeks.   Discharge Planning: Residential hospice.   Care plan was discussed with  patient's son and 2 daughters, TOC and Dr Cruzita Lederer.    Thank you for allowing the Palliative Medicine Team to assist in the care of this patient.   Time In: 1400 Time Out: 1435 Total Time 35 Prolonged Time Billed  no       Greater than 50%  of this time was spent counseling and coordinating care related to the above assessment and plan.  Loistine Chance, MD  Please contact Palliative Medicine Team phone at [phone] for questions and concerns.

## 2021-04-12 NOTE — TOC Progression Note (Signed)
Transition of Care [facility]) - Progression Note    Patient Details  Name: Stacy Sanchez MRN: [id number] Date of Birth: Sep 20, 1945  Transition of Care [facility]) CM/SW [place],  Phone Number: 04/12/2021, 3:38 PM  Clinical Narrative:   CSW met with patient's daughter per request, as they were trying to figure out if patient could return to [facility] and the nurse would need to do an evaluation. CSW spoke with Varney Biles with [facility] to discuss, and per Varney Biles, she said the family was asking questions about rehab vs hospice and didn't seem set on their goals, for CSW to further discuss. CSW met with patient's daughter Stacy Sanchez to discuss, and set up a meeting with palliative MD and CSW to meet with all children. CSW then met with palliative MD and all three children for discussion. After discussion of prognosis and alternatives, family would like to pursue comfort care with transition to residential hospice at [facility]. CSW explained referral process and answered questions. CSW then sent referral to [facility] for review. CSW to follow.    Expected Discharge Plan: [place] Barriers to Discharge: Continued Medical Work up  Expected Discharge Plan and Services Expected Discharge Plan: [place] Choice: Hospice Living arrangements for the past 2 months: [place]                                       Social Determinants of Health (SDOH) Interventions    Readmission Risk Interventions No flowsheet data found.

## 2021-04-12 NOTE — Plan of Care (Signed)
°  Problem: Education: Goal: Knowledge of General Education information will improve Description: Including pain rating scale, medication(s)/side effects and non-pharmacologic comfort measures Outcome: Progressing   Problem: Health Behavior/Discharge Planning: Goal: Ability to manage health-related needs will improve Outcome: Progressing   Problem: Clinical Measurements: Goal: Ability to maintain clinical measurements within normal limits will improve Outcome: Progressing Goal: Will remain free from infection Outcome: Progressing Goal: Diagnostic test results will improve Outcome: Progressing Goal: Respiratory complications will improve Outcome: Progressing Goal: Cardiovascular complication will be avoided Outcome: Progressing   Problem: Ischemic Stroke/TIA Tissue Perfusion: Goal: Complications of ischemic stroke/TIA will be minimized Outcome: Progressing   Problem: Nutrition: Goal: Risk of aspiration will decrease Outcome: Progressing   Problem: Self-Care: Goal: Ability to participate in self-care as condition permits will improve Outcome: Progressing Goal: Verbalization of feelings and concerns over difficulty with self-care will improve Outcome: Progressing Goal: Ability to communicate needs accurately will improve Outcome: Progressing

## 2021-04-12 NOTE — Plan of Care (Signed)
°  Problem: Education: Goal: Knowledge of General Education information will improve Description: Including pain rating scale, medication(s)/side effects and non-pharmacologic comfort measures Outcome: Progressing   Problem: Health Behavior/Discharge Planning: Goal: Ability to manage health-related needs will improve Outcome: Progressing   Problem: Clinical Measurements: Goal: Ability to maintain clinical measurements within normal limits will improve Outcome: Progressing Goal: Will remain free from infection Outcome: Progressing Goal: Diagnostic test results will improve Outcome: Progressing Goal: Respiratory complications will improve Outcome: Progressing Goal: Cardiovascular complication will be avoided Outcome: Progressing   Problem: Activity: Goal: Risk for activity intolerance will decrease Outcome: Progressing   Problem: Nutrition: Goal: Adequate nutrition will be maintained Outcome: Progressing   Problem: Coping: Goal: Level of anxiety will decrease Outcome: Progressing   Problem: Elimination: Goal: Will not experience complications related to bowel motility Outcome: Progressing Goal: Will not experience complications related to urinary retention Outcome: Progressing   Problem: Pain Managment: Goal: General experience of comfort will improve Outcome: Progressing   Problem: Safety: Goal: Ability to remain free from injury will improve Outcome: Progressing   Problem: Skin Integrity: Goal: Risk for impaired skin integrity will decrease Outcome: Progressing   Problem: Education: Goal: Knowledge of disease or condition will improve Outcome: Progressing Goal: Knowledge of secondary prevention will improve (SELECT ALL) Outcome: Progressing Goal: Knowledge of patient specific risk factors will improve (INDIVIDUALIZE FOR PATIENT) Outcome: Progressing   Problem: Coping: Goal: Will verbalize positive feelings about self Outcome: Progressing Goal: Will  identify appropriate support needs Outcome: Progressing   Problem: Health Behavior/Discharge Planning: Goal: Ability to manage health-related needs will improve Outcome: Progressing

## 2021-04-12 NOTE — Progress Notes (Signed)
Pt's family inquired about pt being comfort care and meds being administered. Pt's daughter initially refused HS seroquel and abx. Family now requesting seroquel to be given along with 300 mg gabapentin BID from home list per daughter. Dr. Julian Reil informed.

## 2021-04-13 DIAGNOSIS — E43 Unspecified severe protein-calorie malnutrition: Secondary | ICD-10-CM | POA: Insufficient documentation

## 2021-04-13 NOTE — Progress Notes (Signed)
Daily Progress Note   Patient Name: Stacy Sanchez       Date: 04/13/2021 DOB: 1946-01-09  Age: 75 y.o. MRN#: [id number] Attending Physician: Caren Griffins, MD Primary Care Physician: Burnett Sheng, MD Admit Date: 04/08/2021  Reason for Consultation/Follow-up: Establishing goals of care  Subjective: Patient is resting in bed, appears weak, awake more alert, responds appropriately, daughter at bedside.     Length of Stay: 7  Current Medications: Scheduled Meds:   aspirin  81 mg Oral Daily   Chlorhexidine Gluconate Cloth  6 each Topical Daily   feeding supplement  237 mL Oral BID BM   gabapentin  300 mg Oral BID   heparin injection (subcutaneous)  5,000 Units Subcutaneous Q8H   levothyroxine  50 mcg Oral Once per day on Mon Tue Wed Thu Fri   And   levothyroxine  100 mcg Oral Once per day on Sun Sat   polyethylene glycol  17 g Oral Daily   QUEtiapine  25 mg Oral QHS   sodium chloride flush  3 mL Intravenous Q12H   [START ON 04/17/2021] thiamine injection  100 mg Intravenous Daily    Continuous Infusions:  doxycycline (VIBRAMYCIN) IV 100 mg (04/12/21 1041)   thiamine injection 250 mg (04/12/21 0930)    PRN Meds: acetaminophen **OR** acetaminophen, bisacodyl, haloperidol lactate, ipratropium, ipratropium-albuterol, levalbuterol, morphine injection, Muscle Rub, ondansetron (ZOFRAN) IV, polyethylene glycol  Physical Exam         Awake Verbalizes today Appears with generalized weakness.   Vital Signs: BP (!) 173/91 (BP Location: Right Arm) Comment: RN Notified   Pulse 88    Temp 98.3 F (36.8 C) (Oral)    Resp 20    Ht _0  (1.727 m)    Wt 45 kg    SpO2 95%    BMI 15.08 kg/m  SpO2: SpO2: 95 % O2 Device: O2 Device: Nasal Cannula O2 Flow Rate: O2 Flow Rate (L/min): 4  L/min  Intake/output summary:  Intake/Output Summary (Last 24 hours) at 04/13/2021 0907 Last data filed at 04/13/2021 0554 Gross per 24 hour  Intake 1368.78 ml  Output 700 ml  Net 668.78 ml    LBM: Last BM Date: 04/13/21 Baseline Weight: Weight: 46.7 kg Most recent weight: Weight: 45 kg       Palliative Assessment/Data:  Flowsheet Rows    Flowsheet Row Most Recent Value  Intake Tab   Referral Department Hospitalist  Unit at Time of Referral Med/Surg Unit  Palliative Care Primary Diagnosis Cancer  Date Notified 04/07/21  Palliative Care Type New Palliative care  Reason for referral Clarify Goals of Care  Date of Admission 03/25/2021  Date first seen by Palliative Care 04/10/21  # of days Palliative referral response time 3 Day(s)  # of days IP prior to Palliative referral 1  Clinical Assessment   Palliative Performance Scale Score 30%  Psychosocial & Spiritual Assessment   Palliative Care Outcomes   Patient/Family meeting held? Yes  Who was at the meeting? patient, daughter       Patient Active Problem List   Diagnosis Date Noted   Protein-calorie malnutrition, severe 04/13/2021   Failure to thrive in adult    Palliative care by specialist    Pain of upper abdomen    Unintentional weight loss    Adult failure to thrive 04/08/2021   Abnormal EKG 04/16/2021   Hyperkalemia 03/22/2021   Acute bronchitis 05/09/2020   Dysuria 05/09/2020   Nonischemic cardiomyopathy ([place]) 01/31/2020   Syncope, cardiogenic 01/29/2020   OSA on CPAP 11/09/2019   Altered mental status    AKI (acute kidney injury) ([place]) 07/14/2019   Thrombocytopenia ([place]) 07/14/2019   Hypothyroidism 16/04/930   Acute metabolic encephalopathy 35/57/3220   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   Chronic respiratory failure with hypoxia ([place]) 12/07/2018   Bronchiectasis ([place]) 11/27/2018   Pseudomonas aeruginosa infection 11/27/2018   Thrush 11/27/2018   CAD (coronary artery disease) 11/27/2018   CKD (chronic  kidney disease), stage III ([place]) 11/27/2018   Essential hypertension 11/27/2018   Generalized anxiety disorder 11/27/2018   Peripheral neuropathy 11/27/2018   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  Patient was admitted with acute kidney injury and palliative consultation was done.  Due to change in mental status neurology evaluation was done.  MRI which is pacemaker compatible was recommended as patient has ICD device.  Recommendations/Plan: Goals of care discussions family meeting held with daughter Clarise Cruz who was at bedside, checked in with hospice liaison who will touch base with family today, also spoke with Dr Cruzita Lederer and [facility] colleague Ms Ward Chatters. Patient's daughter stated her concern that the patient appears more alert and awake today, she is wondering if residential hospice is still the appropriate option.  We spent some time talking about differences between SNF rehab with palliative versus residential hospice. We also talked about pros and cons of both these options.  Daughter to discuss with hospice liaison this morning and get more information about the type of care that can be provided in a residential hospice, TOC to follow up with family later on today about disposition options.  Briefly discussed with daughter about some patients having an end of life rally/surge of energy,  some patients go through pre-death signs of improvement where they have more  energy and oral intake also improves. Discussed about long term prognosis with regards to her MRI brain findings  and her functional status prior to this hospitalization. Offered active listening and supportive presence.      Code Status:    Code Status Orders  (From admission, onward)           Start     Ordered   04/09/21 1320  Do not attempt resuscitation (DNR)  Continuous       Question Answer Comment  In the event of cardiac or respiratory ARREST Do not call a code blue   In the event of cardiac or respiratory ARREST Do not perform Intubation, CPR, defibrillation or ACLS   In the event of cardiac or respiratory ARREST Use medication by any route, position, wound care, and other measures to relive pain and suffering. May use oxygen, suction and manual treatment of airway obstruction as needed for comfort.      04/09/21 1319           Code Status History     Date Active Date Inactive Code Status Order ID Comments User Context   04/14/2021 2245 04/09/2021 1319 Full Code [id number]  Marcelyn Bruins, MD Inpatient   03/23/2020 0822 03/23/2020 1442 Full Code [id number]  Jolaine Artist, MD Inpatient   07/14/2019 1024 07/17/2019 2113 Full Code [id number]  Vianne Bulls, MD ED   11/27/2018 1836 12/01/2018 2020 Full Code [id number]  Karie Kirks, DO ED      Advance Directive Documentation    Flowsheet Row Most Recent Value  Type of Advance Directive Healthcare Power of Attorney  Pre-existing out of facility DNR order (yellow form or pink MOST form) --  "MOST" Form in Place? --       Prognosis:  Likely less than 2 weeks.   Discharge Planning: Residential hospice versus SNF rehab with palliative, as per family's wishes. TOC to follow.   Care plan was discussed with  patient's daughter Joellyn Rued and Dr Cruzita Lederer.    Thank you for allowing the Palliative Medicine Team to assist in the care of this patient.   Time In: 8.30 Time Out: 9.05 Total Time 35 Prolonged Time  Billed  no       Greater than 50%  of this time was spent counseling and coordinating care related to the above assessment and plan.  Loistine Chance, MD  Please contact Palliative Medicine Team phone at [phone] for questions and concerns.

## 2021-04-13 NOTE — TOC Progression Note (Signed)
Transition of Care [facility]) - Progression Note    Patient Details  Name: Stacy Sanchez MRN: [id number] Date of Birth: Sep 15, 1945  Transition of Care [facility]) CM/SW Contact  Baldemar Lenis, [place] Phone Number: 04/13/2021, 12:46 PM  Clinical Narrative:   CSW alerted by palliative team and hospice liaison that patient is not considered [facility] eligible today, will continue to follow her course over the weekend. Family interested in seeing how patient does over the weekend as she looks better today. CSW to follow for disposition needs when appropriate.    Expected Discharge Plan: Hospice Medical Facility Barriers to Discharge: Continued Medical Work up  Expected Discharge Plan and Services Expected Discharge Plan: Hospice Medical Facility     Post Acute Care Choice: Hospice Living arrangements for the past 2 months: Independent Living Facility                                       Social Determinants of Health (SDOH) Interventions    Readmission Risk Interventions No flowsheet data found.

## 2021-04-13 NOTE — Progress Notes (Signed)
[facility] [id number]  [facility]) Hospital Liaison Note  Evaluated for [facility]. Patient does not appear to be appropriate today. We will evaluate again on Monday for appropriateness. Please call with questions or concerns.   Lynder Parents [facility] Liaison [phone]

## 2021-04-13 NOTE — Progress Notes (Signed)
Physical Therapy Treatment Patient Details Name: Stacy Sanchez MRN: [id number] DOB: 1945-06-16 Today's Date: 04/13/2021   History of Present Illness Stacy Sanchez is a 75 y.o. female with medical history significant of bronchiectasis, CAD, CKD 3, hypertension, hypothyroidism, neuropathy, cytopenia, CHF, MAC presenting with ongoing abdominal pain. Patient lives in independent living at senior living center. 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    PT Comments    Pt making slow gains toward goals.  Emphasis on transitions to sitting EOB, majority of time at EOB working on truncal activation and sitting blance.  Initiated session with warm up ROM exercise to all 4 extremities with graded assist/resistance.   Recommendations for follow up therapy are one component of a multi-disciplinary discharge planning process, led by the attending physician.  Recommendations may be updated based on patient status, additional functional criteria and insurance authorization.  Follow Up Recommendations  Skilled nursing-short term rehab (<3 hours/day)     Assistance Recommended at Discharge Frequent or constant Supervision/Assistance  Equipment Recommendations  Other (comment) (TBD)    Recommendations for Other Services       Precautions / Restrictions Precautions Precautions: Fall     Mobility  Bed Mobility Overal bed mobility: Needs Assistance Bed Mobility: Sit to Sidelying;Rolling;Sidelying to Sit Rolling: Max assist Sidelying to sit: Total assist     Sit to sidelying: Total assist;+2 for physical assistance General bed mobility comments: cued for direction/technique, assisted into hooklying and truncal assist to roll.  total assist up from sideying and R elbow.  Return to R elbow and total assist to sidelying,    Transfers                   General transfer comment: Unable at this time     Ambulation/Gait               General Gait Details: Unable at this time   Stairs             Wheelchair Mobility    Modified Rankin (Stroke Patients Only) Modified Rankin (Stroke Patients Only) Pre-Morbid Rankin Score: Moderate disability Modified Rankin: Severe disability     Balance Overall balance assessment: Needs assistance Sitting-balance support: Feet supported;Bilateral upper extremity supported;Single extremity supported Sitting balance-Leahy Scale: Poor Sitting balance - Comments: delayed reactions.  Sat EOB >15 min work on truncal reactions in upright sitting, w/shifts, down on each elbow working to stay forward.                                    Cognition Arousal/Alertness: Lethargic;Awake/alert Behavior During Therapy: Flat affect;WFL for tasks assessed/performed Overall Cognitive Status: Impaired/Different from baseline (NT formally)                               Problem Solving: Slow processing;Requires verbal cues;Requires tactile cues          Exercises Other Exercises Other Exercises: Graded assist/resistance to all 4 extremities with facilitation more for weaker L side.    General Comments General comments (skin integrity, edema, etc.): vss overall  sats on RA 93%      Pertinent Vitals/Pain Pain Assessment: Faces Faces Pain Scale: Hurts a little bit Pain Location: generalized with bed mobility Pain Descriptors / Indicators: Discomfort;Grimacing;Guarding Pain Intervention(s): Monitored during session;Repositioned  Home Living                          Prior Function            PT Goals (current goals can now be found in the care plan section) Acute Rehab PT Goals Patient Stated Goal: to not feel sick PT Goal Formulation: With patient/family Time For Goal Achievement: 04/24/21 Potential to Achieve Goals: Fair Progress towards PT goals: Progressing toward goals    Frequency     Min 3X/week      PT Plan Current plan remains appropriate    Co-evaluation              AM-PAC PT "6 Clicks" Mobility   Outcome Measure  Help needed turning from your back to your side while in a flat bed without using bedrails?: Total Help needed moving from lying on your back to sitting on the side of a flat bed without using bedrails?: Total Help needed moving to and from a bed to a chair (including a wheelchair)?: Total Help needed standing up from a chair using your arms (e.g., wheelchair or bedside chair)?: Total Help needed to walk in hospital room?: Total Help needed climbing 3-5 steps with a railing? : Total 6 Click Score: 6    End of Session   Activity Tolerance: Patient tolerated treatment well;Patient limited by fatigue Patient left: in bed;with call bell/phone within reach;with bed alarm set;with family/visitor present Nurse Communication: Mobility status PT Visit Diagnosis: Other abnormalities of gait and mobility (R26.89);Muscle weakness (generalized) (M62.81);Other symptoms and signs involving the nervous system (R29.898)     Time: 1610-9604 PT Time Calculation (min) (ACUTE ONLY): 35 min  Charges:  $Therapeutic Activity: 8-22 mins $Neuromuscular Re-education: 8-22 mins                     04/13/2021  Ginger Carne., PT Acute Rehabilitation Services [phone]  (pager) [phone]  (office)   Tessie Fass Bunyan Brier 04/13/2021, 2:46 PM

## 2021-04-13 NOTE — Progress Notes (Signed)
Pt refusing CPAP tonight.

## 2021-04-13 NOTE — Progress Notes (Addendum)
Pt's daughter refused morning labs this shift. Dr. Julian Reil informed.

## 2021-04-13 NOTE — Progress Notes (Signed)
PROGRESS NOTE  Stacy Sanchez VHQ:[id number] DOB: 12/21/1945 DOA: 04/01/2021 PCP: Burnett Sheng, MD   LOS: 7 days   Brief Narrative / Interim history: 75 year old female with history of bronchiectasis, CAD, CKD 3, HTN, hypothyroidism, MAC was admitted to the hospital with abdominal pain, waxing and waning over the last several days up to a week.  She has been having decreased p.o. intake as well as weight loss and was seeing GI as an outpatient but had to be rescheduled.  She was admitted to the hospital, was found to have acute kidney injury and hyperkalemia.  She had an episode of slurred speech, and an MRI done showed multiple embolic strokes and neurology was consulted.  She was transferred to [facility] ([place]  She has been intermittently confused  Subjective / 24h Interval events: More alert today, remains confused  Assessment & Plan: Principal Problem Abdominal pain, FTT, dysphagia with anorexia, severe protein calorie malnutrition-patient has been having about a 30 pound weight loss in the last year and had very poor p.o. intake, was found to be dehydrated on admission.  BMI is 15.  A CT scan of the abdomen and pelvis without any significant acute findings.  GI consulted, they are entertaining an EGD however family decided not to pursue due to patient's deterioration/acute CVAs.  Supportive treatment for now.  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 Neurology consulted, discussed with Dr. Leonel Ramsay.  Continue aspirin, PT recommends SNF.  2D echo shows normal EF 60 to 65%, no WMA, grade 1 diastolic dysfunction.  RV was normal..  Lipid panel shows an LDL of 28.  A1c 6.3.  Underwent an EEG which showed moderate diffuse encephalopathy of nonspecific etiology without seizures or epileptiform discharges.  Goals of care-ongoing discussions, family  initially were leaning towards more comfort approach.  They are debating whether she will go to residential hospice or to her facility's memory care unit with hospice following. She is more alert today and hospice liaison doesn't think she is hospice appropriate, perhaps SNF / memory unit with hospice / palliative following.  Probable pneumonia-on admission showed mild leukocytosis, and chest x-ray unable to rule out airspace disease.  She has been started on doxycycline, she is afebrile and WBC has normalized now, plan for 5-day course, today is day #4   Hyperkalemia -family refused labs this morning.   EKG abnormality with minimally elevated troponins, history of CAD-New T wave inversions and mildly elevated troponin. Troponin trend flat and likely in the setting of demand ischemia. Cardiology was consulted given patient's family's request and they suspect demand ischemia given no CAD on cath 01/30/2020.  Patient denies any chest pain but has chronic dyspnea and is progressively worsening in the setting of chronic MAI infection and bronchiectasis.  No further work-up per cards  Urinary retention-required several in and out caths, likely due to immobility.  Placed Foley 41/32  Acute metabolic encephalopathy -likely component of in-hospital delirium as well.  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  Due to poor p.o. intake and weight loss thiamine deficiency is a consideration and has been started on thiamine per neurology.   Chronic systolic CHF -Known history of CHF last echo was last month with EF 45-50% and grade 1 diastolic dysfunction.  Repeat 2D echo with EF 60-65%.   Anxiety, depression -Reportedly on Remeron which will be resumed but now discontinued  at the recommendation of neurology  History of VTE status post ICD-defibrillator turned off today per family wishes  Hypothyroidism -Continue home Synthroid of 150 mcg    Hypertension with history of orthostatic  hypotension -Her recent midodrine was stopped given her systolic blood pressures been elevated previously. Nadolol has been held on admission due to her softer blood pressure  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 by bronchiectasis with recent pseudomonal infection -Continue supplemental oxygen and normally wears 2 L during the day and 2 L at night -Palliative care consulted for further goals of care discussion and she is now DNR/DNI   AKI on CKD 3b - Creatinine stable at 1.37 on admission but worsened to up to 2.58 and is now trending down.    OSA -Continue home CPAP  Scheduled Meds:  aspirin  81 mg Oral Daily   Chlorhexidine Gluconate Cloth  6 each Topical Daily   feeding supplement  237 mL Oral BID BM   gabapentin  300 mg Oral BID   heparin injection (subcutaneous)  5,000 Units Subcutaneous Q8H   levothyroxine  50 mcg Oral Once per day on Mon Tue Wed Thu Fri   And   levothyroxine  100 mcg Oral Once per day on Sun Sat   polyethylene glycol  17 g Oral Daily   QUEtiapine  25 mg Oral QHS   sodium chloride flush  3 mL Intravenous Q12H   [START ON 04/17/2021] thiamine injection  100 mg Intravenous Daily   Continuous Infusions:  doxycycline (VIBRAMYCIN) IV 100 mg (04/12/21 1041)   thiamine injection 250 mg (04/12/21 0930)   PRN Meds:.acetaminophen **OR** acetaminophen, bisacodyl, haloperidol lactate, ipratropium, ipratropium-albuterol, levalbuterol, morphine injection, Muscle Rub, ondansetron (ZOFRAN) IV, polyethylene glycol  Diet Orders (From admission, onward)     Start     Ordered   04/10/21 1415  DIET DYS 2 Room service appropriate? Yes; Fluid consistency: Thin  Diet effective now       Question Answer Comment  Room service appropriate? Yes   Fluid consistency: Thin      04/10/21 1414            DVT prophylaxis: heparin injection 5,000 Units Start: 04/09/21 1400     Code Status: DNR  Family Communication: Son present at  bedside  Status is: Inpatient  Remains inpatient appropriate because: Ongoing goals of care  Level of care: Telemetry Medical  Consultants:  Palliative care  Cardiology  Neurology   Procedures:  2D echo  Microbiology  none  Antimicrobials: Doxycycline    Objective: Vitals:   04/12/21 2339 04/13/21 0408 04/13/21 0834 04/13/21 1127  BP: (!) 152/83 (!) 150/89 (!) 173/91 (!) 148/84  Pulse: 87 86 88 (!) 104  Resp: _0 Temp: 98.2 F (36.8 C) 97.9 F (36.6 C) 98.3 F (36.8 C) 98.1 F (36.7 C)  TempSrc: Oral Oral Oral Oral  SpO2: 94% 94% 95% 96%  Weight:      Height:        Intake/Output Summary (Last 24 hours) at 04/13/2021 1204 Last data filed at 04/13/2021 0554 Gross per 24 hour  Intake 1368.78 ml  Output 500 ml  Net 868.78 ml    Filed Weights   04/05/2021 1307 04/10/21 0500  Weight: 46.7 kg 45 kg    Examination: Constitutional: Cachectic, in no distress Eyes: NAD ENMT: Moist mucous membranes Neck: normal, supple Respiratory: CTA bilaterally, no wheezing or crackles Cardiovascular: Regular rate and rhythm, no murmurs Abdomen: Nondistended,  bowel sounds positive Musculoskeletal: no clubbing / cyanosis.  Skin: No new rashes Neurologic: No new focal deficits   Data Reviewed: I have independently reviewed following labs and imaging studies   CBC: Recent Labs  Lab 04/08/21 0527 04/09/21 0141 04/10/21 0337 04/11/21 0327 04/12/21 0210  WBC 10.0 9.2 8.1 7.2 6.5  NEUTROABS 7.3 7.1 6.0 5.3 4.9  HGB 11.3* 10.7* 10.8* 10.1* 10.5*  HCT 39.2 37.2 38.3 34.9* 35.2*  MCV 104.8* 105.7* 105.5* 103.3* 102.6*  PLT 285 247 220 198 053    Basic Metabolic Panel: Recent Labs  Lab 03/26/2021 2222 04/07/21 0133 04/07/21 1704 04/07/21 2155 04/08/21 0527 04/08/21 0840 04/09/21 0141 04/09/21 0508 04/09/21 1355 04/09/21 1717 04/10/21 0337 04/11/21 0327 04/12/21 0210  NA  --    < > 135  --  138  --   --  140  --   --  139 137 141  K 5.6*   < > 5.8*    5.9*   < > 5.7*   < > 5.4* 5.2* 4.9 5.3* 5.2* 5.0 5.2*  CL  --    < > 93*  --  94*  --   --  100  --   --  103 104 107  CO2  --    < > 33*  --  36*  --   --  36*  --   --  _0 GLUCOSE  --    < > 141*  --  115*  --   --  109*  --   --  87 103* 89  BUN  --    < > 62*  --  66*  --   --  65*  --   --  58* 54* 50*  CREATININE  --    < > 2.55*  --  2.58*  --   --  2.15*  --   --  1.95* 1.79* 1.63*  CALCIUM  --    < > 8.4*  --  8.4*  --   --  8.9  --   --  8.7* 8.3* 8.5*  MG 2.0  --  2.3  --  2.3  --  2.3  --   --   --  2.0 1.8 1.8  PHOS 7.5*  --  7.2*  --  5.9*  --  4.2  --   --   --   --   --   --    < > = values in this interval not displayed.    Liver Function Tests: Recent Labs  Lab 04/07/21 0621 04/07/21 1704 04/08/21 0527 04/09/21 0508 04/10/21 0337  AST 33 _1 ALT _2 ALKPHOS 110 103 90 74 66  BILITOT 0.6 0.6 0.5 0.6 0.7  PROT 6.6 6.6 6.4* 6.1* 5.7*  ALBUMIN 2.4* 2.4* 2.3* 2.3* 1.9*    Coagulation Profile: No results for input(s): INR, PROTIME in the last 168 hours. HbA1C: Recent Labs    04/11/21 0327  HGBA1C 6.3*    CBG: Recent Labs  Lab 04/07/21 1635 04/08/21 1153 04/08/21 1603 04/08/21 2117  GLUCAP 145* 135* 150* 128*     Recent Results (from the past 240 hour(s))  Resp Panel by RT-PCR (Flu A&B, Covid) Nasopharyngeal Swab     Status: None   Collection Time: 03/24/2021 12:59 PM   Specimen: Nasopharyngeal Swab; Nasopharyngeal(NP) swabs in vial transport medium  Result Value Ref Range Status   SARS  Coronavirus 2 by RT PCR NEGATIVE NEGATIVE Final    Comment: (NOTE) SARS-CoV-2 target nucleic acids are NOT DETECTED.  The SARS-CoV-2 RNA is generally detectable in upper respiratory specimens during the acute phase of infection. The lowest concentration of SARS-CoV-2 viral copies this assay can detect is 138 copies/mL. A negative result does not preclude SARS-Cov-2 infection and should not be used as the sole basis for treatment  or other patient management decisions. A negative result may occur with  improper specimen collection/handling, submission of specimen other than nasopharyngeal swab, presence of viral mutation(s) within the areas targeted by this assay, and inadequate number of viral copies(<138 copies/mL). A negative result must be combined with clinical observations, patient history, and epidemiological information. The expected result is Negative.  Fact Sheet for Patients:  EntrepreneurPulse.com.au  Fact Sheet for Healthcare Providers:  IncredibleEmployment.be  This test is no t yet approved or cleared by the [place] FDA and  has been authorized for detection and/or diagnosis of SARS-CoV-2 by FDA under an Emergency Use Authorization (EUA). This EUA will remain  in effect (meaning this test can be used) for the duration of the COVID-19 declaration under Section 564(b)(1) of the Act, 21 U.S.C.section 360bbb-3(b)(1), unless the authorization is terminated  or revoked sooner.       Influenza A by PCR NEGATIVE NEGATIVE Final   Influenza B by PCR NEGATIVE NEGATIVE Final    Comment: (NOTE) The Xpert Xpress SARS-CoV-2/FLU/RSV plus assay is intended as an aid in the diagnosis of influenza from Nasopharyngeal swab specimens and should not be used as a sole basis for treatment. Nasal washings and aspirates are unacceptable for Xpert Xpress SARS-CoV-2/FLU/RSV testing.  Fact Sheet for Patients: EntrepreneurPulse.com.au  Fact Sheet for Healthcare Providers: IncredibleEmployment.be  This test is not yet approved or cleared by the [place] FDA and has been authorized for detection and/or diagnosis of SARS-CoV-2 by FDA under an Emergency Use Authorization (EUA). This EUA will remain in effect (meaning this test can be used) for the duration of the COVID-19 declaration under Section 564(b)(1) of the Act, 21 U.S.C. section  360bbb-3(b)(1), unless the authorization is terminated or revoked.  Performed at [facility], [address] [place], [place] [address]   Culture, blood (routine x 2)     Status: None   Collection Time: 03/25/2021  5:30 PM   Specimen: BLOOD  Result Value Ref Range Status   Specimen Description   Final    BLOOD RIGHT ANTECUBITAL Performed at [facility], [address], [place], [place] [address]    Special Requests   Final    Blood Culture adequate volume BOTTLES DRAWN AEROBIC AND ANAEROBIC Performed at [facility], [address], [place], [place] [address]    Culture   Final    NO GROWTH 5 DAYS Performed at [facility], [place] [address] [place], [place] [address]    Report Status 04/11/2021 FINAL  Final  Culture, blood (routine x 2)     Status: None   Collection Time: 04/08/2021 10:22 PM   Specimen: BLOOD  Result Value Ref Range Status   Specimen Description   Final    BLOOD RIGHT ANTECUBITAL Performed at [facility] [address] [place], [place] [address]    Special Requests   Final    BOTTLES DRAWN AEROBIC ONLY Blood Culture adequate volume Performed at [facility] [address] [place], [place] [address]    Culture   Final  NO GROWTH 5 DAYS Performed at [facility], [place] [address] [place], [place] [address]    Report Status 04/12/2021 FINAL  Final  Urine Culture     Status: None   Collection Time: 04/07/21  6:07 PM   Specimen: Urine, Clean Catch  Result Value Ref Range Status   Specimen Description   Final    URINE, CLEAN CATCH Performed at [facility], [place] [address] [place], [place] [address]    Special Requests   Final    NONE Performed at [facility], [place] [address], [place], [place] [address]    Culture   Final    NO GROWTH Performed at [facility], [place] [address], [place], [place]  [address]    Report Status 04/09/2021 FINAL  Final  MRSA Next Gen by PCR, Nasal     Status: None   Collection Time: 04/08/21 11:30 AM   Specimen: Nasal Mucosa; Nasal Swab  Result Value Ref Range Status   MRSA by PCR Next Gen NOT DETECTED NOT DETECTED Final    Comment: (NOTE) The GeneXpert MRSA Assay (FDA approved for NASAL specimens only), is one component of a comprehensive MRSA colonization surveillance program. It is not intended to diagnose MRSA infection nor to guide or monitor treatment for MRSA infections. Test performance is not FDA approved in patients less than 64 years old. Performed at [facility], [place] [address] [place], [place] [address]       Radiology Studies: No results found.   Marzetta Board, MD, PhD [facility]  Between 7 am - 7 pm I am available, please contact me via Amion (for emergencies) or Securechat (non urgent messages)  Between 7 pm - 7 am I am not available, please contact night coverage MD/APP via Amion

## 2021-04-14 DIAGNOSIS — E43 Unspecified severe protein-calorie malnutrition: Secondary | ICD-10-CM

## 2021-04-14 MED ORDER — GLYCOPYRROLATE 0.2 MG/ML IJ SOLN
0.4000 mg | Freq: Four times a day (QID) | INTRAMUSCULAR | Status: DC | PRN
  Administered 2021-04-14 (×2): 0.4 mg via INTRAVENOUS
  Filled 2021-04-14 (×2): qty 2

## 2021-04-14 MED ORDER — MORPHINE SULFATE (PF) 2 MG/ML IV SOLN
1.0000 mg | INTRAVENOUS | Status: DC | PRN
  Administered 2021-04-14 (×2): 2 mg via INTRAVENOUS
  Filled 2021-04-14 (×2): qty 1

## 2021-04-14 NOTE — Progress Notes (Signed)
[facility] 3W07 [facility]) Hospital Liaison Note  Requested to reevaluate patient for [facility] eligibility. Visited patient at bedside and spoke with daughter Lawson Fiscal. Lawson Fiscal states patient has had no appetite and has been sleeping a lot. Updated information sent to [facility] physician.   Approval for [facility] is determined by [facility] MD. Once [facility] MD has determined [facility] eligibility, [facility] will update hospital staff and family.  Please do not hesitate to call with any hospice related questions.    Thank you for the opportunity to participate in this patient's care.   Bobbie "Einar Gip, RN, BSN [facility] Liaison [phone]

## 2021-04-14 NOTE — Progress Notes (Signed)
Daily Progress Note   Patient Name: Stacy Sanchez       Date: 04/14/2021 DOB: 1945/12/02  Age: 75 y.o. MRN#: [id number] Attending Physician: Aline August, MD Primary Care Physician: Burnett Sheng, MD Admit Date: 03/26/2021  Reason for Consultation/Follow-up: Establishing goals of care  Subjective: Patient is resting in bed, appears weak.     Length of Stay: 8  Current Medications: Scheduled Meds:   aspirin  81 mg Oral Daily   Chlorhexidine Gluconate Cloth  6 each Topical Daily   feeding supplement  237 mL Oral BID BM   gabapentin  300 mg Oral BID   heparin injection (subcutaneous)  5,000 Units Subcutaneous Q8H   levothyroxine  50 mcg Oral Once per day on Mon Tue Wed Thu Fri   And   levothyroxine  100 mcg Oral Once per day on Sun Sat   polyethylene glycol  17 g Oral Daily   QUEtiapine  25 mg Oral QHS   sodium chloride flush  3 mL Intravenous Q12H   [START ON 04/17/2021] thiamine injection  100 mg Intravenous Daily    Continuous Infusions:  thiamine injection 250 mg (04/14/21 1131)    PRN Meds: acetaminophen **OR** acetaminophen, bisacodyl, glycopyrrolate, haloperidol lactate, ipratropium, ipratropium-albuterol, levalbuterol, morphine injection, Muscle Rub, ondansetron (ZOFRAN) IV, polyethylene glycol  Physical Exam         Awake, now on 6 N Verbalizes some but is confused Appears with generalized weakness.  Shallow regular breath sounds  Vital Signs: BP (!) 151/96 (BP Location: Left Arm)    Pulse (!) 127    Temp 98.1 F (36.7 C) (Oral)    Resp 16    Ht _0  (1.727 m)    Wt 45 kg    SpO2 90%    BMI 15.08 kg/m  SpO2: SpO2: 90 % O2 Device: O2 Device: Nasal Cannula O2 Flow Rate: O2 Flow Rate (L/min): 4 L/min  Intake/output summary:  Intake/Output  Summary (Last 24 hours) at 04/14/2021 1613 Last data filed at 04/14/2021 1200 Gross per 24 hour  Intake 20 ml  Output 875 ml  Net -855 ml    LBM: Last BM Date: 04/14/21 Baseline Weight: Weight: 46.7 kg Most recent weight: Weight: 45 kg       Palliative Assessment/Data:    Database administrator  Most Recent Value  Intake Tab   Referral Department Hospitalist  Unit at Time of Referral Med/Surg Unit  Palliative Care Primary Diagnosis Cancer  Date Notified 04/07/21  Palliative Care Type New Palliative care  Reason for referral Clarify Goals of Care  Date of Admission 04/11/2021  Date first seen by Palliative Care 04/10/21  # of days Palliative referral response time 3 Day(s)  # of days IP prior to Palliative referral 1  Clinical Assessment   Palliative Performance Scale Score 30%  Psychosocial & Spiritual Assessment   Palliative Care Outcomes   Patient/Family meeting held? Yes  Who was at the meeting? patient, daughter       Patient Active Problem List   Diagnosis Date Noted   Protein-calorie malnutrition, severe 04/13/2021   Failure to thrive in adult    Palliative care by specialist    Pain of upper abdomen    Unintentional weight loss    Adult failure to thrive 04/16/2021   Abnormal EKG 03/26/2021   Hyperkalemia 04/04/2021   Acute bronchitis 05/09/2020   Dysuria 05/09/2020   Nonischemic cardiomyopathy ([place]) 01/31/2020   Syncope, cardiogenic 01/29/2020   OSA on CPAP 11/09/2019   Altered mental status    AKI (acute kidney injury) ([place]) 07/14/2019   Thrombocytopenia ([place]) 07/14/2019   Hypothyroidism 01/60/1093   Acute metabolic encephalopathy 23/55/7322   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   Chronic respiratory failure with hypoxia ([place]) 12/07/2018   Bronchiectasis ([place]) 11/27/2018   Pseudomonas aeruginosa infection 11/27/2018   Thrush 11/27/2018   CAD (coronary artery disease) 11/27/2018   CKD (chronic kidney disease), stage III ([place])  11/27/2018   Essential hypertension 11/27/2018   Generalized anxiety disorder 11/27/2018   Peripheral neuropathy 11/27/2018   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  Patient was admitted with acute kidney injury and palliative consultation was done.  Due to change in mental status neurology evaluation was done.  MRI which is pacemaker compatible was recommended as patient has ICD device.  Recommendations/Plan: Goals of care discussions are ongoing with the patient and her family. Call placed and discussed with both daughters today regarding the patient's current condition and best possible disposition options. Arrived at bedside and also met with son and daughter outside the [facility].  Recommend hospice facility with focus on comfort measures.      Code Status:    Code Status Orders  (From admission, onward)           Start     Ordered   04/09/21 1320  Do not attempt resuscitation (DNR)  Continuous       Question Answer Comment  In the event of cardiac or respiratory ARREST Do not call a code blue   In the event of cardiac or respiratory ARREST Do not perform Intubation, CPR, defibrillation or ACLS   In the event of cardiac or respiratory ARREST Use medication by any route, position, wound care, and other measures to relive pain and suffering. May use oxygen, suction and manual treatment of airway obstruction as needed for comfort.  04/09/21 1319           Code Status History     Date Active Date Inactive Code Status Order ID Comments User Context   04/12/2021 2245 04/09/2021 1319 Full Code [id number]  Marcelyn Bruins, MD Inpatient   03/23/2020 0822 03/23/2020 1442 Full Code [id number]  Jolaine Artist, MD Inpatient   07/14/2019 1024 07/17/2019 2113 Full Code [id number]  Vianne Bulls, MD ED   11/27/2018 1836 12/01/2018 2020 Full Code [id number]  Karie Kirks, DO ED      Advance Directive Documentation    Flowsheet Row Most Recent Value  Type of Advance Directive Healthcare Power of Attorney  Pre-existing out of facility DNR order (yellow form or pink MOST form) --  "MOST" Form in Place? --       Prognosis:  Likely less than 2 weeks.   Discharge Planning: Residential hospice   Care plan was discussed with  patient's daughter s and son, TOC and hospice and [facility] MD.    Thank you for allowing the Palliative Medicine Team to assist in the care of this patient.   Time In: 1500 Time Out: 1505 Total Time 35 Prolonged Time Billed  no       Greater than 50%  of this time was spent counseling and coordinating care related to the above assessment and plan.  Loistine Chance, MD  Please contact Palliative Medicine Team phone at [phone] for questions and concerns.

## 2021-04-14 NOTE — Progress Notes (Signed)
Patient transferred to 6N07.

## 2021-04-14 NOTE — Progress Notes (Signed)
[facility] [id number] [facility]) Hospital Liaison Note   Patient chart and information reviewed by [facility] physician. [facility] eligibility confirmed.    Unfortunately, [facility] is not able to offer a room today. Family and [facility] Manager aware hospital liaison will follow up tomorrow or sooner if a room becomes available.    Please do not hesitate to call with any hospice related questions.    Thank you for the opportunity to participate in this patient's care.   Bobbie "Einar Gip, RN, BSN [facility] Liaison [phone]

## 2021-04-14 NOTE — Progress Notes (Signed)
Patient ID: Stacy Sanchez, female   DOB: 08/20/1945, 75 y.o.   MRN: [id number]  PROGRESS NOTE    Stacy Sanchez  OJJ:[id number] DOB: 01-Feb-1946 DOA: 03/29/2021 PCP: Burnett Sheng, MD   Brief Narrative:  75 year old female with history of bronchiectasis, CAD, CKD 3, HTN, hypothyroidism, MAC was admitted to the hospital with abdominal pain, waxing and waning over the last several days up to a week.  She has been having decreased p.o. intake as well as weight loss and was seeing GI as an outpatient but had to be rescheduled.  She was admitted to the hospital, was found to have acute kidney injury and hyperkalemia.  She had an episode of slurred speech, and an MRI done showed multiple embolic strokes and neurology was consulted.  She was transferred to [facility]  She has been intermittently confused with decreased oral intake.  Palliative care was consulted.  Assessment & Plan:   Abdominal pain/failure to thrive/dysphagia with anorexia/severe protein calorie malnutrition Goals of care -patient has been having about a 30 pound weight loss in the last year and had very poor p.o. intake, was found to be dehydrated on admission.   -CT scan of the abdomen and pelvis without any significant acute findings. -GI consulted: Contemplating EGD but family has decided against it because of overall very poor oral intake and deteriorating condition. -Palliative care following. --Oral intake still very poor.  Apparently ate only 1-2 bites of the last 24 hours.  Family interested in residential hospice. -DC telemetry and continuous pulse ox  Acute CVA Acute metabolic encephalopathy History of memory problems/?  Dementia -MRI done 12/20 showed multiple small areas of acute infarct in the cerebral hemispheres bilaterally, and in the cerebellum bilaterally, likely embolic.  -Neurology recommended to continue aspirin.  Neurology has signed off. - 2D echo shows normal EF 60 to 65%, no WMA, grade 1 diastolic  dysfunction.   -LDL 28.  A1c 6.3. -EEG did not show any seizures  Probable pneumonia -Chest x-ray unable to rule out airspace disease.  She has been treated with doxycycline.  DC antibiotics  EKG abnormality with minimally elevated troponins/history of CAD -No further work-up as per cardiology.  Cardiology has signed off.  Urinary retention status post Foley catheter placement on 04/11/2021  History of chronic systolic CHF -Repeat echo as above.  Anxiety/depression -Remeron has been discontinued as per neurology recommendations  History of VT status post ICD: Defibrillator turned off on 04/13/2021 per family wishes  Hypothyroidism -Continue Synthroid  Hypertension with history of orthostatic hypotension -Midodrine stopped because of elevated blood pressures previously.  Nadolol on hold because of softer blood pressure intermittently.  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  AKI on CKD stage IIIb -Creatinine worsened up to 2.58 during this hospitalization.  Creatinine on 04/12/2021 was 1.63. -Will not repeat labs because of  family's interest in residential hospice  OSA -Continue CPAP   DVT prophylaxis: Heparin Code Status: DNR Family Communication: Daughter at bedside Disposition Plan: Status is: Inpatient  Remains inpatient appropriate because: Severity of illness.  Will possibly need residential hospice.  Consultants: Neurology/palliative care/cardiology  Procedures: Echo/EEG  Antimicrobials:  Anti-infectives (From admission, onward)    Start     Dose/Rate Route Frequency Ordered Stop   04/10/21 1100  doxycycline (VIBRAMYCIN) 100 mg in sodium chloride 0.9 % 250 mL IVPB        100 mg  125 mL/hr over 120 Minutes Intravenous Every 12 hours 04/10/21 0959     04/07/21 0600  doxycycline (VIBRA-TABS) tablet 100 mg   Status:  Discontinued        100 mg Oral Every 12 hours 03/29/2021 2245 04/10/21 0959   03/26/2021 1700  doxycycline (VIBRA-TABS) tablet 100 mg        100 mg Oral  Once 04/17/2021 1658 04/02/2021 1745        Subjective: Patient seen and examined at bedside.  Daughter present at bedside.  Patient is a poor historian.  Apparently oral intake has been very minimal as per the daughter over the last 24 hours.  No overnight fever or seizures reported.  Objective: Vitals:   04/13/21 2035 04/14/21 0000 04/14/21 0426 04/14/21 0750  BP: (!) 175/104  (!) 144/86 (!) 159/97  Pulse: 97  96 (!) 110  Resp:   20 16  Temp: 98.1 F (36.7 C)  97.7 F (36.5 C)   TempSrc: Oral  Oral   SpO2: 95% 93% 91% 92%  Weight:      Height:        Intake/Output Summary (Last 24 hours) at 04/14/2021 0948 Last data filed at 04/13/2021 1638 Gross per 24 hour  Intake 392.13 ml  Output 325 ml  Net 67.13 ml   Filed Weights   03/27/2021 1307 04/10/21 0500  Weight: 46.7 kg 45 kg    Examination:  General exam: Appears calm and comfortable.  Looks chronically ill and deconditioned.  Currently on 2 L oxygen via nasal cannula. Respiratory system: Bilateral decreased breath sounds at bases with some scattered crackles Cardiovascular system: S1 & S2 heard, Rate controlled Gastrointestinal system: Abdomen is nondistended, soft and nontender. Normal bowel sounds heard. Extremities: No cyanosis, clubbing; trace lower extremity edema present      Data Reviewed: I have personally reviewed following labs and imaging studies  CBC: Recent Labs  Lab 04/08/21 0527 04/09/21 0141 04/10/21 0337 04/11/21 0327 04/12/21 0210  WBC 10.0 9.2 8.1 7.2 6.5  NEUTROABS 7.3 7.1 6.0 5.3 4.9  HGB 11.3* 10.7* 10.8* 10.1* 10.5*  HCT 39.2 37.2 38.3 34.9* 35.2*  MCV 104.8* 105.7* 105.5* 103.3* 102.6*  PLT 285 247 220 198 124   Basic Metabolic Panel: Recent Labs  Lab 04/07/21 1704 04/07/21 2155 04/08/21 0527 04/08/21 0840  04/09/21 0141 04/09/21 0508 04/09/21 1355 04/09/21 1717 04/10/21 0337 04/11/21 0327 04/12/21 0210  NA 135  --  138  --   --  140  --   --  139 137 141  K 5.8*   5.9*   < > 5.7*   < > 5.4* 5.2* 4.9 5.3* 5.2* 5.0 5.2*  CL 93*  --  94*  --   --  100  --   --  103 104 107  CO2 33*  --  36*  --   --  36*  --   --  _0 GLUCOSE 141*  --  115*  --   --  109*  --   --  87 103* 89  BUN 62*  --  66*  --   --  65*  --   --  58* 54* 50*  CREATININE 2.55*  --  2.58*  --   --  2.15*  --   --  1.95* 1.79* 1.63*  CALCIUM 8.4*  --  8.4*  --   --  8.9  --   --  8.7* 8.3* 8.5*  MG 2.3  --  2.3  --  2.3  --   --   --  2.0 1.8 1.8  PHOS 7.2*  --  5.9*  --  4.2  --   --   --   --   --   --    < > = values in this interval not displayed.   GFR: Estimated Creatinine Clearance: 20.5 mL/min (A) (by C-G formula based on SCr of 1.63 mg/dL (H)). Liver Function Tests: Recent Labs  Lab 04/07/21 1704 04/08/21 0527 04/09/21 0508 04/10/21 0337  AST _0 ALT _1 ALKPHOS 103 90 74 66  BILITOT 0.6 0.5 0.6 0.7  PROT 6.6 6.4* 6.1* 5.7*  ALBUMIN 2.4* 2.3* 2.3* 1.9*   No results for input(s): LIPASE, AMYLASE in the last 168 hours. Recent Labs  Lab 04/07/21 1704  AMMONIA 43*   Coagulation Profile: No results for input(s): INR, PROTIME in the last 168 hours. Cardiac Enzymes: No results for input(s): CKTOTAL, CKMB, CKMBINDEX, TROPONINI in the last 168 hours. BNP (last 3 results) No results for input(s): PROBNP in the last 8760 hours. HbA1C: No results for input(s): HGBA1C in the last 72 hours. CBG: Recent Labs  Lab 04/07/21 1635 04/08/21 1153 04/08/21 1603 04/08/21 2117  GLUCAP 145* 135* 150* 128*   Lipid Profile: No results for input(s): CHOL, HDL, LDLCALC, TRIG, CHOLHDL, LDLDIRECT in the last 72 hours. Thyroid Function Tests: No results for input(s): TSH, T4TOTAL, FREET4, T3FREE, THYROIDAB in the last 72 hours. Anemia Panel: No results for input(s): VITAMINB12, FOLATE,  FERRITIN, TIBC, IRON, RETICCTPCT in the last 72 hours. Sepsis Labs: No results for input(s): PROCALCITON, LATICACIDVEN in the last 168 hours.  Recent Results (from the past 240 hour(s))  Resp Panel by RT-PCR (Flu A&B, Covid) Nasopharyngeal Swab     Status: None   Collection Time: 04/07/2021 12:59 PM   Specimen: Nasopharyngeal Swab; Nasopharyngeal(NP) swabs in vial transport medium  Result Value Ref Range Status   SARS Coronavirus 2 by RT PCR NEGATIVE NEGATIVE Final    Comment: (NOTE) SARS-CoV-2 target nucleic acids are NOT DETECTED.  The SARS-CoV-2 RNA is generally detectable in upper respiratory specimens during the acute phase of infection. The lowest concentration of SARS-CoV-2 viral copies this assay can detect is 138 copies/mL. A negative result does not preclude SARS-Cov-2 infection and should not be used as the sole basis for treatment or other patient management decisions. A negative result may occur with  improper specimen collection/handling, submission of specimen other than nasopharyngeal swab, presence of viral mutation(s) within the areas targeted by this assay, and inadequate number of viral copies(<138 copies/mL). A negative result must be combined with clinical observations, patient history, and epidemiological information. The expected result is Negative.  Fact Sheet for Patients:  EntrepreneurPulse.com.au  Fact Sheet for Healthcare Providers:  IncredibleEmployment.be  This test is no t yet approved or cleared by the [place] FDA and  has been authorized for detection and/or diagnosis of SARS-CoV-2 by FDA under an Emergency Use Authorization (EUA). This EUA will remain  in effect (meaning this test can be used) for the duration of the COVID-19 declaration under Section 564(b)(1) of the Act, 21 U.S.C.section 360bbb-3(b)(1), unless the authorization is terminated  or revoked sooner.       Influenza A by PCR NEGATIVE  NEGATIVE Final   Influenza B by PCR NEGATIVE NEGATIVE Final    Comment: (NOTE) The Xpert Xpress SARS-CoV-2/FLU/RSV plus assay is intended as an aid in the diagnosis of  influenza from Nasopharyngeal swab specimens and should not be used as a sole basis for treatment. Nasal washings and aspirates are unacceptable for Xpert Xpress SARS-CoV-2/FLU/RSV testing.  Fact Sheet for Patients: EntrepreneurPulse.com.au  Fact Sheet for Healthcare Providers: IncredibleEmployment.be  This test is not yet approved or cleared by the [place] FDA and has been authorized for detection and/or diagnosis of SARS-CoV-2 by FDA under an Emergency Use Authorization (EUA). This EUA will remain in effect (meaning this test can be used) for the duration of the COVID-19 declaration under Section 564(b)(1) of the Act, 21 U.S.C. section 360bbb-3(b)(1), unless the authorization is terminated or revoked.  Performed at [facility], [address], [place], [place] [address]   Culture, blood (routine x 2)     Status: None   Collection Time: 04/04/2021  5:30 PM   Specimen: BLOOD  Result Value Ref Range Status   Specimen Description   Final    BLOOD RIGHT ANTECUBITAL Performed at [facility], [address], [place], [place] [address]    Special Requests   Final    Blood Culture adequate volume BOTTLES DRAWN AEROBIC AND ANAEROBIC Performed at [facility], [address], [place], [place] [address]    Culture   Final    NO GROWTH 5 DAYS Performed at [facility], [place] [address] [place], [place] [address]    Report Status 04/11/2021 FINAL  Final  Culture, blood (routine x 2)     Status: None   Collection Time: 04/03/2021 10:22 PM   Specimen: BLOOD  Result Value Ref Range Status   Specimen Description   Final    BLOOD RIGHT ANTECUBITAL Performed at [facility] [address],  [place], [place] [address]    Special Requests   Final    BOTTLES DRAWN AEROBIC ONLY Blood Culture adequate volume Performed at [facility] [address], [place], [place] [address]    Culture   Final    NO GROWTH 5 DAYS Performed at [facility], [place] [address] [place], [place] [address]    Report Status 04/12/2021 FINAL  Final  Urine Culture     Status: None   Collection Time: 04/07/21  6:07 PM   Specimen: Urine, Clean Catch  Result Value Ref Range Status   Specimen Description   Final    URINE, CLEAN CATCH Performed at [facility], [place] [address], [place], [place] [address]    Special Requests   Final    NONE Performed at [facility], [place] [address] [place], [place] [address]    Culture   Final    NO GROWTH Performed at [facility], [place] [address], [place], [place] [address]    Report Status 04/09/2021 FINAL  Final  MRSA Next Gen by PCR, Nasal     Status: None   Collection Time: 04/08/21 11:30 AM   Specimen: Nasal Mucosa; Nasal Swab  Result Value Ref Range Status   MRSA by PCR Next Gen NOT DETECTED NOT DETECTED Final    Comment: (NOTE) The GeneXpert MRSA Assay (FDA approved for NASAL specimens only), is one component of a comprehensive MRSA colonization surveillance program. It is not intended to diagnose MRSA infection nor to guide or monitor treatment for MRSA infections. Test performance is not FDA approved in patients less than 61 years old. Performed at [facility], [place] [address], [place],  [address]  Radiology Studies: No results found.      Scheduled Meds:  aspirin  81 mg Oral Daily   Chlorhexidine Gluconate Cloth  6 each Topical Daily   feeding supplement  237 mL Oral BID BM   gabapentin  300 mg Oral BID   heparin injection (subcutaneous)  5,000 Units Subcutaneous Q8H   levothyroxine  50 mcg Oral Once per day on Mon Tue Wed Thu Fri   And    levothyroxine  100 mcg Oral Once per day on Sun Sat   polyethylene glycol  17 g Oral Daily   QUEtiapine  25 mg Oral QHS   sodium chloride flush  3 mL Intravenous Q12H   [START ON 04/17/2021] thiamine injection  100 mg Intravenous Daily   Continuous Infusions:  doxycycline (VIBRAMYCIN) IV 100 mg (04/14/21 0021)   thiamine injection Stopped (04/13/21 1350)          Aline August, MD [facility] 04/14/2021, 9:48 AM

## 2021-04-14 NOTE — Progress Notes (Signed)
RT NOTE:  Pt refuses CPAP tonight. 

## 2021-04-14 NOTE — Progress Notes (Signed)
Patient noted not interactive an assessment, daughter at bedside shared that pt with decrease appetite more than yesterday and request to have TELE and cont pulse ox removed. MD paged. Patient appeared ion no distress at this time. Will continue to monitor.  Safina Huard

## 2021-04-22 DEATH — deceased

## 2021-05-04 ENCOUNTER — Ambulatory Visit: Payer: Medicare HMO | Admitting: Gastroenterology

## 2021-05-15 ENCOUNTER — Ambulatory Visit: Payer: Medicare HMO | Admitting: Internal Medicine

## 2021-05-23 NOTE — Death Summary Note (Signed)
Death Summary  Stacy Sanchez VOZ:[id number] DOB: [birth year] DOA: 04/16/2021  PCP: Burnett Sheng, MD  Admit date: 04-16-2021 Date of Death: 04/25/2021 Time of Death: 31   History of present illness:  76 year old female with history of bronchiectasis, CAD, CKD 3, HTN, hypothyroidism, MAC was admitted to the hospital with abdominal pain, waxing and waning over the last several days up to a week.  She has been having decreased p.o. intake as well as weight loss and was seeing GI as an outpatient but had to be rescheduled.  She was admitted to the hospital, was found to have acute kidney injury and hyperkalemia.  She had an episode of slurred speech, and an MRI done showed multiple embolic strokes and neurology was consulted.  She was transferred to [facility].  She had been intermittently confused with decreased oral intake.  Palliative care was consulted.  Patient/family subsequently recommending residential hospice.  Patient passed away on 04-25-2021 at [place].  Final Diagnoses:   Abdominal pain/failure to thrive/dysphagia with anorexia/severe protein calorie malnutrition Acute CVA Acute metabolic encephalopathy History of memory problems/?  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    Significant Diagnostic Studies: CT ABDOMEN PELVIS WO CONTRAST  Result Date: 04/07/2021 CLINICAL DATA:  Acute abdominal pain  EXAM: CT ABDOMEN AND PELVIS WITHOUT CONTRAST TECHNIQUE: Multidetector CT imaging of the abdomen and pelvis was performed following the standard protocol without IV contrast. COMPARISON:  12/24/2020 FINDINGS: Lower chest: Lung bases again demonstrate evidence of bronchiectasis with multiple areas of mucous plugging particularly in the right lower lobe. The mucous plugging has progressed in the interval from the prior exam. Left basilar atelectasis is noted new from the prior exam. Hepatobiliary: No focal liver abnormality is seen. No gallstones, gallbladder wall thickening, or biliary dilatation. Pancreas: Unremarkable. No pancreatic ductal dilatation or surrounding inflammatory changes. Spleen: No acute abnormality noted. 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 Adrenals/Urinary Tract: Adrenal glands are within normal limits. Considerable scatter artifact is noted from the lumbar hardware limiting evaluation of the kidneys. A few scattered nonobstructing renal stones are noted within the left kidney. The largest of these measures 6 mm in greatest dimension. The previously seen lesions within the left kidney are not well appreciated on this exam due to the lack of IV contrast. No obstructive changes are noted. Bladder is well distended. Stomach/Bowel: Fecal material is noted throughout the colon. No obstructive changes are noted. The appendix is not well visualized. No inflammatory changes to suggest appendicitis are noted. The stomach and small bowel appear within normal limits. Vascular/Lymphatic: Aortic atherosclerosis. No enlarged abdominal or pelvic lymph nodes. Reproductive: Status post hysterectomy. No adnexal masses. Other: No abdominal wall hernia or abnormality. No abdominopelvic ascites. Musculoskeletal: Degenerative changes of lumbar spine are noted. Postsurgical changes are seen from L2-L4 with interbody fusion at both levels. Sacral perineural  cyst is again identified and stable. IMPRESSION: Stable left renal calculi without obstructive change. The known lesions within the left kidney are not well evaluated on this exam due to  lack of IV contrast. These have been evaluated on prior ultrasound and shown to represent simple and complex renal cysts. Progressive mucous plugging particularly in the right lower lobe in areas of bronchiectasis when compared with the prior exam. New left basilar atelectasis is noted when compare with the prior study. No other focal abnormality is noted. Electronically Signed   By: Inez Catalina M.D.   On: 04/07/2021 19:15   CT HEAD WO CONTRAST (5MM)  Result Date: 04/09/2021 CLINICAL DATA:  Follow-up examination for acute stroke. EXAM: CT HEAD WITHOUT CONTRAST TECHNIQUE: Contiguous axial images were obtained from the base of the skull through the vertex without intravenous contrast. COMPARISON:  Prior CT from 04/07/2021. FINDINGS: Brain: Cerebral volume within normal limits for age. Small remote lacunar infarct present at the anterior right basal ganglia, stable. Mild chronic microvascular ischemic disease. No acute large vessel territory infarct. No acute intracranial hemorrhage. No mass lesion, midline shift or mass effect. No hydrocephalus or extra-axial fluid collection. Vascular: No hyperdense vessel. Scattered vascular calcifications noted within the carotid siphons. Skull: Scalp soft tissues and calvarium within normal limits. Sinuses/Orbits: Globes and orbital soft tissues demonstrate no acute finding. Mild scattered mucosal thickening noted within the ethmoidal air cells. Paranasal sinuses are otherwise largely clear. Trace right mastoid effusion, of doubtful significance. Other: None. IMPRESSION: 1. Stable head CT. No acute intracranial abnormality. 2. Mild chronic microvascular ischemic disease with small remote lacunar infarct at the anterior right basal ganglia, stable. Electronically Signed   By: Jeannine Boga M.D.   On: 04/09/2021 22:11   MR ANGIO HEAD WO CONTRAST  Result Date: 04/10/2021 CLINICAL DATA:  Acute neuro deficit.  Stroke. EXAM: MRI HEAD WITHOUT CONTRAST MRA HEAD WITHOUT CONTRAST MRA NECK WITHOUT CONTRAST TECHNIQUE: Multiplanar, multiecho pulse sequences of the brain and surrounding structures were obtained without intravenous contrast. Angiographic images of the Circle of Willis were obtained using MRA technique without intravenous contrast. Angiographic images of the neck were obtained using MRA technique without intravenous contrast. Carotid stenosis measurements (when applicable) are obtained utilizing NASCET criteria, using the distal internal carotid diameter as the denominator. COMPARISON:  CT head 04/09/2021 FINDINGS: MRI HEAD FINDINGS Brain: Multiple small areas of acute infarct are present in both cerebral hemispheres involving both frontal lobes and the left parietal lobe. Small acute infarcts in the cerebellum bilaterally. Brainstem intact. Negative for hemorrhage or mass. 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 Ventricle size normal. Mild chronic microvascular ischemic change in the white matter and pons. Vascular: Normal arterial flow voids Skull and upper cervical spine: No focal skeletal lesion. Sinuses/Orbits: Paranasal sinuses clear. Bilateral cataract extraction Other: None MRA HEAD FINDINGS Internal carotid artery widely patent bilaterally. Anterior and middle cerebral arteries widely patent Normal posterior circulation. No stenosis or large vessel occlusion. Negative for aneurysm. MRA NECK FINDINGS Antegrade flow in the carotid and vertebral arteries bilaterally. No stenosis or occlusion identified. IMPRESSION: 1. Multiple small areas of acute infarct in the cerebral hemispheres bilaterally and in the cerebellum bilaterally. Probable emboli. 2. Negative MRA head 3. Negative MRA neck 4. No source of emboli identified in  the head neck. Electronically Signed   By: Franchot Gallo M.D.   On: 04/10/2021 14:45   MR ANGIO NECK WO CONTRAST  Result Date: 04/10/2021 CLINICAL DATA:  Acute neuro deficit.  Stroke. EXAM: MRI HEAD WITHOUT CONTRAST MRA HEAD WITHOUT CONTRAST MRA NECK WITHOUT CONTRAST TECHNIQUE: Multiplanar, multiecho pulse sequences of the brain and surrounding structures were obtained without intravenous  contrast. Angiographic images of the Circle of Willis were obtained using MRA technique without intravenous contrast. Angiographic images of the neck were obtained using MRA technique without intravenous contrast. Carotid stenosis measurements (when applicable) are obtained utilizing NASCET criteria, using the distal internal carotid diameter as the denominator. COMPARISON:  CT head 04/09/2021 FINDINGS: MRI HEAD FINDINGS Brain: Multiple small areas of acute infarct are present in both cerebral hemispheres involving both frontal lobes and the left parietal lobe. Small acute infarcts in the cerebellum bilaterally. Brainstem intact. Negative for hemorrhage or mass. 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 Ventricle size normal. Mild chronic microvascular ischemic change in the white matter and pons. Vascular: Normal arterial flow voids Skull and upper cervical spine: No focal skeletal lesion. Sinuses/Orbits: Paranasal sinuses clear. Bilateral cataract extraction Other: None MRA HEAD FINDINGS Internal carotid artery widely patent bilaterally. Anterior and middle cerebral arteries widely patent Normal posterior circulation. No stenosis or large vessel occlusion. Negative for aneurysm. MRA NECK FINDINGS Antegrade flow in the carotid and vertebral arteries bilaterally. No stenosis or occlusion identified. IMPRESSION: 1. Multiple small areas of acute infarct in the cerebral hemispheres bilaterally and in the cerebellum bilaterally. Probable emboli. 2. Negative MRA head 3.  Negative MRA neck 4. No source of emboli identified in the head neck. Electronically Signed   By: Franchot Gallo M.D.   On: 04/10/2021 14:45   MR BRAIN WO CONTRAST  Result Date: 04/10/2021 CLINICAL DATA:  Acute neuro deficit.  Stroke. EXAM: MRI HEAD WITHOUT CONTRAST MRA HEAD WITHOUT CONTRAST MRA NECK WITHOUT CONTRAST TECHNIQUE: Multiplanar, multiecho pulse sequences of the brain and surrounding structures were obtained without intravenous contrast. Angiographic images of the Circle of Willis were obtained using MRA technique without intravenous contrast. Angiographic images of the neck were obtained using MRA technique without intravenous contrast. Carotid stenosis measurements (when applicable) are obtained utilizing NASCET criteria, using the distal internal carotid diameter as the denominator. COMPARISON:  CT head 04/09/2021 FINDINGS: MRI HEAD FINDINGS Brain: Multiple small areas of acute infarct are present in both cerebral hemispheres involving both frontal lobes and the left parietal lobe. Small acute infarcts in the cerebellum bilaterally. Brainstem intact. Negative for hemorrhage or mass. 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 Ventricle size normal. Mild chronic microvascular ischemic change in the white matter and pons. Vascular: Normal arterial flow voids Skull and upper cervical spine: No focal skeletal lesion. Sinuses/Orbits: Paranasal sinuses clear. Bilateral cataract extraction Other: None MRA HEAD FINDINGS Internal carotid artery widely patent bilaterally. Anterior and middle cerebral arteries widely patent Normal posterior circulation. No stenosis or large vessel occlusion. Negative for aneurysm. MRA NECK FINDINGS Antegrade flow in the carotid and vertebral arteries bilaterally. No stenosis or occlusion identified. IMPRESSION: 1. Multiple small areas of acute infarct in the cerebral hemispheres bilaterally and in the cerebellum  bilaterally. Probable emboli. 2. Negative MRA head 3. Negative MRA neck 4. No source of emboli identified in the head neck. Electronically Signed   By: Franchot Gallo M.D.   On: 04/10/2021 14:45   US RENAL  Result Date: 04/08/2021 CLINICAL DATA:  Acute kidney injury. EXAM: RENAL / URINARY TRACT ULTRASOUND COMPLETE COMPARISON:  CT new, 04/07/2021.  Renal ultrasound, 12/24/2020. FINDINGS: Right Kidney: Renal measurements: 7.5 x 3.2 x 5.6 cm = volume: 57 mL. Increased parenchymal echogenicity and diffuse cortical thinning. 2 simple appearing cysts, upper and midpole, 1.2 x 1.3 x 1.4 cm and 1.6 x 1.1 x 1.4 cm respectively. No other masses,  no stones and no hydronephrosis. Left Kidney: Renal measurements: 9.1 x 4.6 x 5.0 cm = volume: 109.1 mL. Increased parenchymal echogenicity. Small cysts, midpole cyst measuring 1.2 x 1.0 x 1.5 cm and lower pole cyst measuring 1.0 x 0.7 x 1.0 cm. Echogenic foci consistent nonobstructing calculi, largest 8 mm, lower pole. No hydronephrosis. Bladder: Appears normal for degree of bladder distention. Other: None. IMPRESSION: 1. Findings consistent with medical renal disease increased renal parenchymal echogenicity bilaterally. 2. No acute findings.  No hydronephrosis. 3. Bilateral renal cysts and left intrarenal stone. 4. Right kidney measures smaller than on the prior ultrasound, current volume 57 mL, previous volume 112 mL. Electronically Signed   By: Lajean Manes M.D.   On: 04/08/2021 09:48   DG CHEST PORT 1 VIEW  Result Date: 04/08/2021 CLINICAL DATA:  76 year old female with history of shortness of breath. EXAM: PORTABLE CHEST 1 VIEW COMPARISON:  Chest x-ray 04/02/2021. FINDINGS: Widespread areas of interstitial prominence and patchy airspace disease are noted throughout the lungs bilaterally, worsened compared to the prior study, particularly in the right upper lobe near the apex. Bilateral apical pleuroparenchymal thickening, similar to the prior study, but worsened when  compared to more remote prior examination from 12/24/2020, particularly on the right, suggesting the possibility of some loculated right apical pleural fluid. Small bilateral pleural effusions also noted in the bases. Pulmonary vasculature is largely obscured. Heart size is mildly enlarged. Upper mediastinal contours are within normal limits. Atherosclerotic calcifications are noted in the thoracic aorta. Left-sided pacemaker/AICD with lead tip projecting over the expected location of the right ventricular apex. Orthopedic fixation hardware in the lumbar spine incompletely imaged. IMPRESSION: 1. The appearance the chest suggests worsening multilobar bilateral bronchopneumonia, most severe in the right upper lobe. Small bilateral pleural effusions are now evident, including potentially loculated pleural fluid in the apex of the right hemithorax. 2. Mild cardiomegaly. 3. Aortic atherosclerosis. Electronically Signed   By: Vinnie Langton M.D.   On: 04/08/2021 08:34   DG Chest Portable 1 View  Result Date: 04/09/2021 CLINICAL DATA:  Weakness, history of CHF EXAM: PORTABLE CHEST 1 VIEW COMPARISON:  CT examination and chest radiograph dated December 24, 2020. FINDINGS: The heart is mildly enlarged. Single lead AICD in place. Biapical pleural/parenchymal scarring. Bilateral prominent interstitial marking consistent with prominent bronchiectasis, unchanged. Stable bibasilar atelectasis/mild fibrotic changes. IMPRESSION: 1. Prominent bilateral interstitial markings Carroll system with bronchiectasis with mild basilar atelectasis, unchanged. Acute airspace disease can not be completely excluded. No large pleural effusion. 2.  Stable mild cardiomegaly with AICD.  No evidence of pulmonary Electronically Signed   By: Keane Police D.O.   On: 03/30/2021 13:36   EEG adult  Result Date: 04/09/2021 Lora Havens, MD     04/09/2021  4:37 PM Patient Name: Miyoko Hashimi MRN: [id number] Epilepsy Attending: Lora Havens  Referring Physician/Provider: Dr Kerney Elbe Date:04/09/2021 Duration: 22.40 mins Patient history: 76yo F with an episode of brief delayed responses with R sided weakness. EEG to evaluate for seizure. Level of alertness: lethargic, asleep AEDs during EEG study: None Technical aspects: This EEG study was done with scalp electrodes positioned according to the 10-20 International system of electrode placement. Electrical activity was acquired at a sampling rate of _0  and reviewed with a high frequency filter of _1  and a low frequency filter of _2 . EEG data were recorded continuously and digitally stored. Description: No posterior dominant rhythm was seen. Sleep was characterized by sleep spindles (12 to 14 Hz), maximal frontocentral region. EEG  showed continuous generalized 3 to 6 Hz theta-delta slowing, at times with triphasic morphology. Hyperventilation and photic stimulation were not performed.   ABNORMALITY - Continuous slow, generalized IMPRESSION: This study is suggestive of moderate diffuse encephalopathy, nonspecific etiology. No seizures or definite epileptiform discharges were seen throughout the recording. Lora Havens   ECHOCARDIOGRAM COMPLETE  Result Date: 04/11/2021    ECHOCARDIOGRAM REPORT   Patient Name:   DORENA DORFMAN Date of Exam: 04/11/2021 Medical Rec #:  [id number]     Height:       68.0 in Accession #:    [id number]    Weight:       99.2 lb Date of Birth:  [birth year]     BSA:          1.517 m Patient Age:    45 years      BP:           143/89 mmHg Patient Gender: F             HR:           90 bpm. Exam Location:  Inpatient Procedure: 2D Echo, Cardiac Doppler and Color Doppler Indications:    Stroke  History:        Patient has prior history of Echocardiogram examinations, most                 recent 03/02/2021. CAD, Abnormal ECG, Signs/Symptoms:Syncope;                 Risk Factors:Hypertension.  Sonographer:    Glo Herring Referring Phys: Heber-Overgaard  1.  Left ventricular ejection fraction, by estimation, is 60 to 65%. The left ventricle has normal function. The left ventricle has no regional wall motion abnormalities. Left ventricular diastolic parameters are consistent with Grade I diastolic dysfunction (impaired relaxation).  2. The right ventricular apex contracts normally, while the free wall is hypokinetic (McConnell's sign). Consider pulmonary embolism. Right ventricular systolic function is mildly reduced. The right ventricular size is mildly enlarged. Tricuspid regurgitation signal is inadequate for assessing PA pressure.  3. A small pericardial effusion is present. The pericardial effusion is localized near the right ventricle. There is no evidence of cardiac tamponade.  4. The mitral valve is myxomatous. No evidence of mitral valve regurgitation. There is mild late systolic prolapse of of the mitral valve.  5. The tricuspid valve is myxomatous.  6. The aortic valve is tricuspid. Aortic valve regurgitation is mild to moderate. No aortic stenosis is present.  7. The inferior vena cava is dilated in size with <50% respiratory variability, suggesting right atrial pressure of 15 mmHg. Comparison(s): Prior images reviewed side by side. The left ventricular function has improved. The right ventricular systolic function is worse. There is increased PA pressure, increased RA pressure and a new small pericardial effusion. Consider acute cor pulmonale (e.g. acute pulmonary embolism). FINDINGS  Left Ventricle: Left ventricular ejection fraction, by estimation, is 60 to 65%. The left ventricle has normal function. The left ventricle has no regional wall motion abnormalities. The left ventricular internal cavity size was normal in size. There is  borderline concentric left ventricular hypertrophy. Left ventricular diastolic parameters are consistent with Grade I diastolic dysfunction (impaired relaxation). Indeterminate filling pressures. Right Ventricle: The right  ventricular apex contracts normally, while the free wall is hypokinetic (McConnell's sign). Consider pulmonary embolism. The right ventricular size is mildly enlarged. No increase in right ventricular wall thickness. Right ventricular systolic function is mildly reduced. Tricuspid  regurgitation signal is inadequate for assessing PA pressure. The tricuspid regurgitant velocity is 2.62 m/s, and with an assumed right atrial pressure of 15 mmHg, the estimated right ventricular  systolic pressure is 07.3 mmHg. Left Atrium: Left atrial size was normal in size. Right Atrium: Right atrial size was normal in size. Pericardium: A small pericardial effusion is present. The pericardial effusion is localized near the right ventricle. There is no evidence of cardiac tamponade. Mitral Valve: The mitral valve is myxomatous. There is mild late systolic prolapse of of the mitral valve. No evidence of mitral valve regurgitation. Tricuspid Valve: The tricuspid valve is myxomatous. Tricuspid valve regurgitation is not demonstrated. Aortic Valve: The aortic valve is tricuspid. Aortic valve regurgitation is mild to moderate. No aortic stenosis is present. Aortic valve mean gradient measures 3.0 mmHg. Aortic valve peak gradient measures 5.9 mmHg. Aortic valve area, by VTI measures 2.46 cm. Pulmonic Valve: The pulmonic valve was normal in structure. Pulmonic valve regurgitation is not visualized. Aorta: The aortic root and ascending aorta are structurally normal, with no evidence of dilitation. Venous: The inferior vena cava is dilated in size with less than 50% respiratory variability, suggesting right atrial pressure of 15 mmHg. IAS/Shunts: No atrial level shunt detected by color flow Doppler.  LEFT VENTRICLE PLAX 2D LVIDd:         3.70 cm   Diastology LVIDs:         2.40 cm   LV e' medial:    4.03 cm/s LV PW:         1.20 cm   LV E/e' medial:  13.8 LV IVS:        1.20 cm   LV e' lateral:   5.33 cm/s LVOT diam:     1.90 cm   LV E/e'  lateral: 10.5 LV SV:         49 LV SV Index:   32 LVOT Area:     2.84 cm  IVC IVC diam: 2.20 cm LEFT ATRIUM         Index LA diam:    2.30 cm 1.52 cm/m  AORTIC VALVE AV Area (Vmax):    2.23 cm AV Area (Vmean):   2.23 cm AV Area (VTI):     2.46 cm AV Vmax:           121.00 cm/s AV Vmean:          78.500 cm/s AV VTI:            0.198 m AV Peak Grad:      5.9 mmHg AV Mean Grad:      3.0 mmHg LVOT Vmax:         95.30 cm/s LVOT Vmean:        61.800 cm/s LVOT VTI:          0.172 m LVOT/AV VTI ratio: 0.87  AORTA Ao Root diam: 3.50 cm Ao Asc diam:  3.50 cm MITRAL VALVE               TRICUSPID VALVE MV Area (PHT): 5.46 cm    TR Peak grad:   27.4 mmHg MV Decel Time: 139 msec    TR Vmax:        261.70 cm/s MV E velocity: 55.70 cm/s MV A velocity: 97.70 cm/s  SHUNTS MV E/A ratio:  0.57        Systemic VTI:  0.17 m  Systemic Diam: 1.90 cm Sanda Klein MD Electronically signed by Sanda Klein MD Signature Date/Time: 04/11/2021/1:57:33 PM    Final    CT HEAD CODE STROKE WO CONTRAST`  Result Date: 04/07/2021 CLINICAL DATA:  Code stroke. Altered mental status, found unresponsive EXAM: CT HEAD WITHOUT CONTRAST TECHNIQUE: Contiguous axial images were obtained from the base of the skull through the vertex without intravenous contrast. COMPARISON:  Brain MRI 07/15/2019 FINDINGS: Brain: There is no evidence of acute intracranial hemorrhage, extra-axial fluid collection, or acute infarct. Parenchymal volume is normal. The ventricles are normal in size. There is mild hypodensity in the periventricular white matter likely reflecting mild chronic white matter microangiopathy. There is no solid mass lesion.  There is no midline shift. Vascular: No hyperdense vessel or unexpected calcification. Skull: Normal. Negative for fracture or focal lesion. Sinuses/Orbits: The imaged paranasal sinuses are clear. Bilateral lens implants are in place. The globes and orbits are otherwise unremarkable. Other: None.  ASPECTS [facility] Stroke Program Early CT Score) - Ganglionic level infarction (caudate, lentiform nuclei, internal capsule, insula, M1-M3 cortex): 7 - Supraganglionic infarction (M4-M6 cortex): 3 Total score (0-10 with 10 being normal): 10 IMPRESSION: 1. No acute intracranial pathology. 2. ASPECTS is 10 These results were paged via AMION at the time of interpretation on 04/07/2021 at 5:30 pm to provider [facility] . Electronically Signed   By: Valetta Mole M.D.   On: 04/07/2021 17:36   US Abdomen Limited RUQ (LIVER/GB)  Result Date: 04/01/2021 CLINICAL DATA:  Right upper quadrant pain EXAM: ULTRASOUND ABDOMEN LIMITED RIGHT UPPER QUADRANT COMPARISON:  None. FINDINGS: Gallbladder: Mild gallbladder wall thickening. No gallstones visualized. No sonographic Murphy sign noted by sonographer. Common bile duct: Diameter: 3 mm Liver: No focal lesion identified. Within normal limits in parenchymal echogenicity. Portal vein is patent on color Doppler imaging with normal direction of blood flow towards the liver. 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 Recommend follow-up ultrasound every 5 years. This recommendation follows ACR consensus guidelines: White Paper of the ACR Incidental Findings Committee II on Vascular Findings. J Am Coll Radiol 2013; 10:789-794. Simple cyst of the right kidney measuring up to 1.3 cm. IMPRESSION: 1. Mild gallbladder wall thickening with no gallstones and negative sonographic Murphy sign, findings are equivocal for acute cholecystitis. Further evaluation with HIDA scan could be considered. 2. 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 Recommend follow-up ultrasound every 5 years. This recommendation follows ACR consensus guidelines: White Paper of the ACR Incidental Findings Committee II on Vascular Findings. J Am Coll Radiol 2013; 10:789-794. Electronically Signed   By: Yetta Glassman M.D.   On: 04/08/2021 15:54    Microbiology: No results found for  this or any previous visit (from the past 240 hour(s)).   Labs: Basic Metabolic Panel: No results for input(s): NA, K, CL, CO2, GLUCOSE, BUN, CREATININE, CALCIUM, MG, PHOS in the last 168 hours. Liver Function Tests: No results for input(s): AST, ALT, ALKPHOS, BILITOT, PROT, ALBUMIN in the last 168 hours. No results for input(s): LIPASE, AMYLASE in the last 168 hours. No results for input(s): AMMONIA in the last 168 hours. CBC: No results for input(s): WBC, NEUTROABS, HGB, HCT, MCV, PLT in the last 168 hours. Cardiac Enzymes: No results for input(s): CKTOTAL, CKMB, CKMBINDEX, TROPONINI in the last 168 hours. D-Dimer No results for input(s): DDIMER in the last 72 hours. BNP: Invalid input(s): POCBNP CBG: No results for input(s): GLUCAP in the last 168 hours. Anemia work up No results for  input(s): VITAMINB12, FOLATE, FERRITIN, TIBC, IRON, RETICCTPCT in the last 72 hours. Urinalysis    Component Value Date/Time   COLORURINE AMBER (A) 04/07/2021 1807   APPEARANCEUR HAZY (A) 04/07/2021 1807   LABSPEC 1.018 04/07/2021 1807   PHURINE 5.0 04/07/2021 1807   GLUCOSEU NEGATIVE 04/07/2021 1807   HGBUR NEGATIVE 04/07/2021 1807   BILIRUBINUR NEGATIVE 04/07/2021 1807   KETONESUR NEGATIVE 04/07/2021 1807   PROTEINUR 100 (A) 04/07/2021 1807   NITRITE NEGATIVE 04/07/2021 1807   LEUKOCYTESUR NEGATIVE 04/07/2021 1807   Sepsis Labs Invalid input(s): PROCALCITONIN,  WBC,  LACTICIDVEN     SIGNED:  Aline August, MD  [facility] 04/22/2021, 1:54 PM
